# Patient Record
Sex: Female | Born: 1940
Health system: Southern US, Community
[De-identification: ages and names within clinical notes are randomized; demographics above are authoritative.]

## PROBLEM LIST (undated history)

## (undated) DIAGNOSIS — D649 Anemia, unspecified: Secondary | ICD-10-CM

## (undated) DIAGNOSIS — E876 Hypokalemia: Secondary | ICD-10-CM

## (undated) DIAGNOSIS — K219 Gastro-esophageal reflux disease without esophagitis: Secondary | ICD-10-CM

## (undated) DIAGNOSIS — D689 Coagulation defect, unspecified: Secondary | ICD-10-CM

## (undated) DIAGNOSIS — I10 Essential (primary) hypertension: Secondary | ICD-10-CM

## (undated) DIAGNOSIS — C801 Malignant (primary) neoplasm, unspecified: Secondary | ICD-10-CM

## (undated) DIAGNOSIS — K635 Polyp of colon: Secondary | ICD-10-CM

## (undated) DIAGNOSIS — M1990 Unspecified osteoarthritis, unspecified site: Secondary | ICD-10-CM

## (undated) DIAGNOSIS — Z8612 Personal history of poliomyelitis: Secondary | ICD-10-CM

## (undated) DIAGNOSIS — H269 Unspecified cataract: Secondary | ICD-10-CM

## (undated) DIAGNOSIS — I4891 Unspecified atrial fibrillation: Secondary | ICD-10-CM

## (undated) DIAGNOSIS — T783XXA Angioneurotic edema, initial encounter: Secondary | ICD-10-CM

## (undated) DIAGNOSIS — E785 Hyperlipidemia, unspecified: Secondary | ICD-10-CM

## (undated) DIAGNOSIS — R059 Cough, unspecified: Secondary | ICD-10-CM

## (undated) DIAGNOSIS — T7840XA Allergy, unspecified, initial encounter: Secondary | ICD-10-CM

## (undated) DIAGNOSIS — K52831 Collagenous colitis: Secondary | ICD-10-CM

## (undated) DIAGNOSIS — R05 Cough: Secondary | ICD-10-CM

## (undated) HISTORY — DX: Unspecified cataract: H26.9

## (undated) HISTORY — DX: Allergy, unspecified, initial encounter: T78.40XA

## (undated) HISTORY — DX: Angioneurotic edema, initial encounter: T78.3XXA

## (undated) HISTORY — DX: Malignant (primary) neoplasm, unspecified: C80.1

## (undated) HISTORY — DX: Hypokalemia: E87.6

## (undated) HISTORY — PX: OTHER SURGICAL HISTORY: SHX169

## (undated) HISTORY — DX: Collagenous colitis: K52.831

## (undated) HISTORY — PX: APPENDECTOMY: SHX54

## (undated) HISTORY — DX: Personal history of poliomyelitis: Z86.12

## (undated) HISTORY — PX: BREAST BIOPSY: SHX20

## (undated) HISTORY — DX: Coagulation defect, unspecified: D68.9

## (undated) HISTORY — DX: Gastro-esophageal reflux disease without esophagitis: K21.9

## (undated) HISTORY — PX: ADENOIDECTOMY: SUR15

## (undated) HISTORY — DX: Unspecified atrial fibrillation: I48.91

## (undated) HISTORY — DX: Polyp of colon: K63.5

## (undated) HISTORY — PX: SKIN CANCER EXCISION: SHX779

## (undated) HISTORY — PX: COLONOSCOPY: SHX174

## (undated) HISTORY — PX: TONSILLECTOMY: SHX5217

## (undated) HISTORY — PX: ENDOVENOUS ABLATION SAPHENOUS VEIN W/ LASER: SUR449

## (undated) HISTORY — DX: Essential (primary) hypertension: I10

## (undated) HISTORY — DX: Unspecified osteoarthritis, unspecified site: M19.90

## (undated) HISTORY — DX: Hyperlipidemia, unspecified: E78.5

## (undated) HISTORY — PX: TONSILLECTOMY: SUR1361

---
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
## 1998-10-03 ENCOUNTER — Other Ambulatory Visit: Admission: RE | Admit: 1998-10-03 | Discharge: 1998-10-03 | Payer: Self-pay | Admitting: Obstetrics and Gynecology

## 1999-08-28 ENCOUNTER — Ambulatory Visit (HOSPITAL_COMMUNITY): Admission: RE | Admit: 1999-08-28 | Discharge: 1999-08-28 | Payer: Self-pay | Admitting: Internal Medicine

## 1999-08-28 ENCOUNTER — Encounter (INDEPENDENT_AMBULATORY_CARE_PROVIDER_SITE_OTHER): Payer: Self-pay

## 1999-11-13 ENCOUNTER — Other Ambulatory Visit: Admission: RE | Admit: 1999-11-13 | Discharge: 1999-11-13 | Payer: Self-pay | Admitting: Obstetrics and Gynecology

## 2000-12-06 ENCOUNTER — Other Ambulatory Visit: Admission: RE | Admit: 2000-12-06 | Discharge: 2000-12-06 | Payer: Self-pay | Admitting: Obstetrics and Gynecology

## 2001-12-15 ENCOUNTER — Other Ambulatory Visit: Admission: RE | Admit: 2001-12-15 | Discharge: 2001-12-15 | Payer: Self-pay | Admitting: Obstetrics and Gynecology

## 2003-01-07 ENCOUNTER — Other Ambulatory Visit: Admission: RE | Admit: 2003-01-07 | Discharge: 2003-01-07 | Payer: Self-pay | Admitting: Obstetrics and Gynecology

## 2004-02-11 ENCOUNTER — Other Ambulatory Visit: Admission: RE | Admit: 2004-02-11 | Discharge: 2004-02-11 | Payer: Self-pay | Admitting: Obstetrics and Gynecology

## 2004-07-20 ENCOUNTER — Ambulatory Visit: Payer: Self-pay | Admitting: Occupational Therapy

## 2004-11-24 ENCOUNTER — Ambulatory Visit: Payer: Self-pay | Admitting: Internal Medicine

## 2004-12-02 ENCOUNTER — Ambulatory Visit: Payer: Self-pay | Admitting: Internal Medicine

## 2005-06-10 ENCOUNTER — Other Ambulatory Visit: Admission: RE | Admit: 2005-06-10 | Discharge: 2005-06-10 | Payer: Self-pay | Admitting: Obstetrics and Gynecology

## 2006-07-04 ENCOUNTER — Encounter: Payer: Self-pay | Admitting: Internal Medicine

## 2006-08-23 ENCOUNTER — Ambulatory Visit: Payer: Self-pay | Admitting: Internal Medicine

## 2006-10-04 ENCOUNTER — Ambulatory Visit: Payer: Self-pay | Admitting: Internal Medicine

## 2006-11-16 ENCOUNTER — Ambulatory Visit: Payer: Self-pay | Admitting: Internal Medicine

## 2006-11-16 LAB — CONVERTED CEMR LAB
Cholesterol: 233 mg/dL (ref 0–200)
HDL: 63.8 mg/dL (ref >39.0)
Total CHOL/HDL Ratio: 3.7
VLDL: 16 mg/dL (ref 0–40)

## 2007-05-18 ENCOUNTER — Ambulatory Visit: Payer: Self-pay | Admitting: Internal Medicine

## 2007-05-18 LAB — CONVERTED CEMR LAB
AST: 21 units/L (ref 0–37)
Direct LDL: 139.7 mg/dL
Total CHOL/HDL Ratio: 3.4
VLDL: 13 mg/dL (ref 0–40)

## 2007-07-03 ENCOUNTER — Encounter: Payer: Self-pay | Admitting: Internal Medicine

## 2007-07-03 DIAGNOSIS — Z862 Personal history of diseases of the blood and blood-forming organs and certain disorders involving the immune mechanism: Secondary | ICD-10-CM | POA: Insufficient documentation

## 2007-07-03 DIAGNOSIS — K52831 Collagenous colitis: Secondary | ICD-10-CM | POA: Insufficient documentation

## 2007-07-03 DIAGNOSIS — E7800 Pure hypercholesterolemia, unspecified: Secondary | ICD-10-CM | POA: Insufficient documentation

## 2007-07-03 DIAGNOSIS — Z8612 Personal history of poliomyelitis: Secondary | ICD-10-CM | POA: Insufficient documentation

## 2007-07-03 DIAGNOSIS — K589 Irritable bowel syndrome without diarrhea: Secondary | ICD-10-CM | POA: Insufficient documentation

## 2007-07-03 DIAGNOSIS — E785 Hyperlipidemia, unspecified: Secondary | ICD-10-CM

## 2007-07-03 DIAGNOSIS — C44791 Other specified malignant neoplasm of skin of unspecified lower limb, including hip: Secondary | ICD-10-CM | POA: Insufficient documentation

## 2007-07-04 ENCOUNTER — Ambulatory Visit: Payer: Self-pay | Admitting: Internal Medicine

## 2007-08-08 ENCOUNTER — Telehealth: Payer: Self-pay | Admitting: Internal Medicine

## 2007-09-06 ENCOUNTER — Ambulatory Visit: Payer: Self-pay | Admitting: Internal Medicine

## 2007-09-06 LAB — CONVERTED CEMR LAB
Direct LDL: 137.2 mg/dL
Triglycerides: 59 mg/dL (ref 0–149)
VLDL: 12 mg/dL (ref 0–40)

## 2007-09-07 ENCOUNTER — Telehealth: Payer: Self-pay | Admitting: Internal Medicine

## 2007-09-20 ENCOUNTER — Ambulatory Visit: Payer: Self-pay | Admitting: Internal Medicine

## 2007-09-20 DIAGNOSIS — I10 Essential (primary) hypertension: Secondary | ICD-10-CM

## 2007-09-21 ENCOUNTER — Encounter: Payer: Self-pay | Admitting: Internal Medicine

## 2007-10-25 ENCOUNTER — Ambulatory Visit: Payer: Self-pay | Admitting: Internal Medicine

## 2007-10-25 LAB — CONVERTED CEMR LAB
BUN: 20 mg/dL (ref 6–23)
CRP, High Sensitivity: <1 — ABNORMAL LOW (ref 0.00–5.00)
Creatinine, Ser: 0.7 mg/dL (ref 0.4–1.2)
GFR calc non Af Amer: 89 mL/min
Potassium: 4.6 meq/L (ref 3.5–5.1)

## 2007-11-01 ENCOUNTER — Ambulatory Visit: Payer: Self-pay | Admitting: Internal Medicine

## 2007-11-01 DIAGNOSIS — K219 Gastro-esophageal reflux disease without esophagitis: Secondary | ICD-10-CM

## 2007-12-08 ENCOUNTER — Ambulatory Visit: Payer: Self-pay | Admitting: Internal Medicine

## 2007-12-08 LAB — CONVERTED CEMR LAB
BUN: 22 mg/dL (ref 6–23)
CO2: 31 meq/L (ref 19–32)
Calcium: 9.5 mg/dL (ref 8.4–10.5)
Chloride: 102 meq/L (ref 96–112)
Creatinine, Ser: 0.7 mg/dL (ref 0.4–1.2)
GFR calc Af Amer: 107 mL/min
GFR calc non Af Amer: 89 mL/min
Glucose, Bld: 97 mg/dL (ref 70–99)
Potassium: 3.5 meq/L (ref 3.5–5.1)
Sodium: 140 meq/L (ref 135–145)

## 2007-12-10 ENCOUNTER — Telehealth: Payer: Self-pay | Admitting: Internal Medicine

## 2007-12-14 ENCOUNTER — Ambulatory Visit: Payer: Self-pay | Admitting: Internal Medicine

## 2008-04-23 ENCOUNTER — Ambulatory Visit: Payer: Self-pay | Admitting: Internal Medicine

## 2008-04-23 DIAGNOSIS — J309 Allergic rhinitis, unspecified: Secondary | ICD-10-CM | POA: Insufficient documentation

## 2008-05-28 ENCOUNTER — Ambulatory Visit: Payer: Self-pay | Admitting: Internal Medicine

## 2008-05-28 LAB — CONVERTED CEMR LAB
BUN: 16 mg/dL (ref 6–23)
CO2: 32 meq/L (ref 19–32)
Calcium: 9.4 mg/dL (ref 8.4–10.5)
Chloride: 102 meq/L (ref 96–112)
Cholesterol: 234 mg/dL (ref 0–200)
Creatinine, Ser: 0.7 mg/dL (ref 0.4–1.2)
Glucose, Bld: 96 mg/dL (ref 70–99)
Total CHOL/HDL Ratio: 4.2
Triglycerides: 87 mg/dL (ref 0–149)

## 2008-06-04 ENCOUNTER — Ambulatory Visit: Payer: Self-pay | Admitting: Internal Medicine

## 2008-07-02 ENCOUNTER — Telehealth: Payer: Self-pay | Admitting: Internal Medicine

## 2008-07-17 ENCOUNTER — Encounter: Payer: Self-pay | Admitting: Internal Medicine

## 2008-07-25 ENCOUNTER — Encounter: Payer: Self-pay | Admitting: Internal Medicine

## 2008-08-21 ENCOUNTER — Telehealth: Payer: Self-pay | Admitting: Internal Medicine

## 2008-08-28 ENCOUNTER — Ambulatory Visit: Payer: Self-pay | Admitting: Internal Medicine

## 2008-09-24 ENCOUNTER — Telehealth: Payer: Self-pay | Admitting: Internal Medicine

## 2008-11-12 ENCOUNTER — Ambulatory Visit: Payer: Self-pay | Admitting: Internal Medicine

## 2008-12-02 ENCOUNTER — Telehealth: Payer: Self-pay | Admitting: Internal Medicine

## 2008-12-10 ENCOUNTER — Ambulatory Visit: Payer: Self-pay | Admitting: Internal Medicine

## 2008-12-10 ENCOUNTER — Encounter: Payer: Self-pay | Admitting: Internal Medicine

## 2008-12-11 ENCOUNTER — Encounter: Payer: Self-pay | Admitting: Internal Medicine

## 2009-02-27 ENCOUNTER — Telehealth: Payer: Self-pay | Admitting: Family Medicine

## 2009-03-07 ENCOUNTER — Telehealth: Payer: Self-pay | Admitting: Internal Medicine

## 2009-03-31 ENCOUNTER — Ambulatory Visit: Payer: Self-pay | Admitting: Internal Medicine

## 2009-03-31 DIAGNOSIS — R079 Chest pain, unspecified: Secondary | ICD-10-CM | POA: Insufficient documentation

## 2009-03-31 LAB — CONVERTED CEMR LAB
ALT: 34 units/L (ref 0–35)
BUN: 21 mg/dL (ref 6–23)
Bilirubin, Direct: 0.1 mg/dL (ref 0.0–0.3)
CO2: 26 meq/L (ref 19–32)
CRP: 0.3 mg/dL (ref <0.6)
Chloride: 101 meq/L (ref 96–112)
Glucose, Bld: 105 mg/dL — ABNORMAL HIGH (ref 70–99)
Indirect Bilirubin: 0.6 mg/dL (ref 0.0–0.9)
LDL Cholesterol: 144 mg/dL — ABNORMAL HIGH (ref 0–99)
Potassium: 4 meq/L (ref 3.5–5.3)
Sodium: 140 meq/L (ref 135–145)
Total CHOL/HDL Ratio: 3.6
VLDL: 23 mg/dL (ref 0–40)

## 2009-04-01 ENCOUNTER — Encounter: Payer: Self-pay | Admitting: Internal Medicine

## 2009-04-04 ENCOUNTER — Telehealth: Payer: Self-pay | Admitting: Internal Medicine

## 2009-04-04 ENCOUNTER — Ambulatory Visit: Payer: Self-pay | Admitting: Cardiology

## 2009-04-17 ENCOUNTER — Encounter: Payer: Self-pay | Admitting: Cardiology

## 2009-04-17 ENCOUNTER — Ambulatory Visit: Payer: Self-pay

## 2009-04-22 ENCOUNTER — Telehealth: Payer: Self-pay | Admitting: Cardiology

## 2009-04-27 ENCOUNTER — Encounter: Payer: Self-pay | Admitting: Internal Medicine

## 2009-05-01 ENCOUNTER — Telehealth: Payer: Self-pay | Admitting: Internal Medicine

## 2009-05-07 ENCOUNTER — Encounter: Payer: Self-pay | Admitting: Internal Medicine

## 2009-05-29 ENCOUNTER — Telehealth: Payer: Self-pay | Admitting: Internal Medicine

## 2009-06-05 ENCOUNTER — Ambulatory Visit: Payer: Self-pay | Admitting: Internal Medicine

## 2009-07-15 ENCOUNTER — Ambulatory Visit: Payer: Self-pay | Admitting: Internal Medicine

## 2009-07-16 ENCOUNTER — Encounter: Payer: Self-pay | Admitting: Internal Medicine

## 2009-07-17 ENCOUNTER — Telehealth: Payer: Self-pay | Admitting: Internal Medicine

## 2009-07-17 DIAGNOSIS — M25559 Pain in unspecified hip: Secondary | ICD-10-CM

## 2009-08-05 ENCOUNTER — Encounter: Admission: RE | Admit: 2009-08-05 | Discharge: 2009-08-05 | Payer: Self-pay | Admitting: Orthopaedic Surgery

## 2009-08-07 ENCOUNTER — Ambulatory Visit: Payer: Self-pay | Admitting: Family

## 2009-08-07 DIAGNOSIS — N390 Urinary tract infection, site not specified: Secondary | ICD-10-CM

## 2009-08-07 LAB — CONVERTED CEMR LAB
Ketones, urine, test strip: NEGATIVE
Nitrite: POSITIVE
Protein, U semiquant: 30
pH: 5

## 2009-08-13 ENCOUNTER — Encounter: Payer: Self-pay | Admitting: Internal Medicine

## 2009-08-14 ENCOUNTER — Encounter: Payer: Self-pay | Admitting: Internal Medicine

## 2009-08-25 ENCOUNTER — Telehealth: Payer: Self-pay | Admitting: Internal Medicine

## 2009-08-28 ENCOUNTER — Telehealth: Payer: Self-pay | Admitting: Internal Medicine

## 2009-09-12 ENCOUNTER — Telehealth: Payer: Self-pay | Admitting: Internal Medicine

## 2009-09-12 ENCOUNTER — Ambulatory Visit: Payer: Self-pay | Admitting: Internal Medicine

## 2009-09-12 LAB — CONVERTED CEMR LAB
Alkaline Phosphatase: 46 units/L (ref 39–117)
Bilirubin, Direct: 0.2 mg/dL (ref 0.0–0.3)
CO2: 31 meq/L (ref 19–32)
Calcium: 9.9 mg/dL (ref 8.4–10.5)
Chloride: 101 meq/L (ref 96–112)
Creatinine, Ser: 0.7 mg/dL (ref 0.4–1.2)
Direct LDL: 123 mg/dL
HDL: 69.7 mg/dL (ref >39.00)
Sodium: 139 meq/L (ref 135–145)
Total Bilirubin: 1 mg/dL (ref 0.3–1.2)
Total CHOL/HDL Ratio: 3
Triglycerides: 73 mg/dL (ref 0.0–149.0)
VLDL: 14.6 mg/dL (ref 0.0–40.0)

## 2009-09-19 ENCOUNTER — Ambulatory Visit: Payer: Self-pay | Admitting: Internal Medicine

## 2009-10-29 ENCOUNTER — Telehealth: Payer: Self-pay | Admitting: Internal Medicine

## 2009-11-03 ENCOUNTER — Encounter (INDEPENDENT_AMBULATORY_CARE_PROVIDER_SITE_OTHER): Payer: Self-pay | Admitting: *Deleted

## 2009-11-03 ENCOUNTER — Telehealth (INDEPENDENT_AMBULATORY_CARE_PROVIDER_SITE_OTHER): Payer: Self-pay | Admitting: *Deleted

## 2009-12-16 ENCOUNTER — Ambulatory Visit: Payer: Self-pay | Admitting: Internal Medicine

## 2009-12-24 ENCOUNTER — Telehealth: Payer: Self-pay | Admitting: Internal Medicine

## 2010-01-14 ENCOUNTER — Ambulatory Visit: Payer: Self-pay | Admitting: Internal Medicine

## 2010-01-14 DIAGNOSIS — K635 Polyp of colon: Secondary | ICD-10-CM

## 2010-01-14 HISTORY — DX: Polyp of colon: K63.5

## 2010-01-15 ENCOUNTER — Encounter: Payer: Self-pay | Admitting: Internal Medicine

## 2010-01-21 ENCOUNTER — Encounter: Payer: Self-pay | Admitting: Internal Medicine

## 2010-01-27 ENCOUNTER — Telehealth: Payer: Self-pay | Admitting: Internal Medicine

## 2010-02-05 ENCOUNTER — Encounter: Payer: Self-pay | Admitting: Internal Medicine

## 2010-02-05 LAB — CONVERTED CEMR LAB
Calcium: 9.6 mg/dL (ref 8.4–10.5)
Creatinine, Ser: 0.86 mg/dL (ref 0.40–1.20)
Sodium: 141 meq/L (ref 135–145)

## 2010-02-11 ENCOUNTER — Encounter: Payer: Self-pay | Admitting: Internal Medicine

## 2010-03-02 ENCOUNTER — Telehealth: Payer: Self-pay | Admitting: Internal Medicine

## 2010-03-09 ENCOUNTER — Ambulatory Visit: Payer: Self-pay | Admitting: Internal Medicine

## 2010-03-09 ENCOUNTER — Ambulatory Visit: Payer: Self-pay | Admitting: Diagnostic Radiology

## 2010-03-09 ENCOUNTER — Ambulatory Visit (HOSPITAL_BASED_OUTPATIENT_CLINIC_OR_DEPARTMENT_OTHER): Admission: RE | Admit: 2010-03-09 | Discharge: 2010-03-09 | Payer: Self-pay | Admitting: Internal Medicine

## 2010-03-09 ENCOUNTER — Telehealth: Payer: Self-pay | Admitting: Internal Medicine

## 2010-03-09 DIAGNOSIS — R05 Cough: Secondary | ICD-10-CM

## 2010-04-08 ENCOUNTER — Ambulatory Visit: Payer: Self-pay | Admitting: Family

## 2010-04-08 DIAGNOSIS — J329 Chronic sinusitis, unspecified: Secondary | ICD-10-CM | POA: Insufficient documentation

## 2010-04-08 DIAGNOSIS — J40 Bronchitis, not specified as acute or chronic: Secondary | ICD-10-CM | POA: Insufficient documentation

## 2010-05-27 ENCOUNTER — Encounter: Payer: Self-pay | Admitting: Internal Medicine

## 2010-05-27 ENCOUNTER — Encounter: Admission: RE | Admit: 2010-05-27 | Discharge: 2010-05-27 | Payer: Self-pay | Admitting: Orthopaedic Surgery

## 2010-05-27 ENCOUNTER — Telehealth: Payer: Self-pay | Admitting: Internal Medicine

## 2010-06-01 ENCOUNTER — Ambulatory Visit: Payer: Self-pay | Admitting: Internal Medicine

## 2010-06-01 LAB — CONVERTED CEMR LAB
CO2: 27 meq/L (ref 19–32)
Chloride: 102 meq/L (ref 96–112)
Creatinine, Ser: 0.77 mg/dL (ref 0.40–1.20)
Potassium: 4.5 meq/L (ref 3.5–5.3)
Sodium: 140 meq/L (ref 135–145)

## 2010-06-02 ENCOUNTER — Encounter: Payer: Self-pay | Admitting: Internal Medicine

## 2010-07-13 ENCOUNTER — Ambulatory Visit: Payer: Self-pay | Admitting: Gastroenterology

## 2010-07-13 ENCOUNTER — Telehealth: Payer: Self-pay | Admitting: Internal Medicine

## 2010-07-13 DIAGNOSIS — R197 Diarrhea, unspecified: Secondary | ICD-10-CM

## 2010-07-14 ENCOUNTER — Telehealth: Payer: Self-pay | Admitting: Nurse Practitioner

## 2010-07-16 ENCOUNTER — Telehealth: Payer: Self-pay | Admitting: Internal Medicine

## 2010-07-21 ENCOUNTER — Telehealth: Payer: Self-pay | Admitting: Nurse Practitioner

## 2010-08-26 ENCOUNTER — Encounter: Payer: Self-pay | Admitting: Internal Medicine

## 2010-08-26 ENCOUNTER — Ambulatory Visit
Admission: RE | Admit: 2010-08-26 | Discharge: 2010-08-26 | Payer: Self-pay | Source: Home / Self Care | Attending: Vascular Surgery | Admitting: Vascular Surgery

## 2010-08-31 ENCOUNTER — Encounter: Payer: Self-pay | Admitting: Internal Medicine

## 2010-09-15 NOTE — Assessment & Plan Note (Signed)
Summary: 2 month follow up/mhf   Vital Signs:  Patient profile:   70 year old female Weight:      170.50 pounds BMI:     28.48 O2 Sat:      96 % on Room air Temp:     98.2 degrees F oral Pulse rate:   66 / minute Pulse rhythm:   regular Resp:     18 per minute BP sitting:   130 / 70  (right arm) Cuff size:   large  Vitals Entered By: Glendell Docker CMA (September 19, 2009 10:54 AM)  O2 Flow:  Room air  Primary Care Provider:  Thomos Lemons, DO  CC:  2 Month Follow up.  History of Present Illness: 2 Month Follow up  started physical therapy for 3 days a week to help with hip pain arthritis in lower spine dx by ortho- informed that may be related to her hip pain  Htn - stable.  reviewed PFT's - small airway dz  Hyperlipidemia - good med compliance  She has good trip to [place] to visit her grandchildren.  valium made her loopy.  did not sleep well on long flight  Preventive Screening-Counseling & Management  Alcohol-Tobacco     Smoking Status: quit  Allergies: 1)  ! Erythromycin 2)  ! Sulfa  Past History:  Past Medical History: Current Problems:  CHEST PAIN (ICD-786.50) HYPERTENSION (ICD-401.9)  HYPERLIPIDEMIA (ICD-272.4) ALLERGIC RHINITIS (ICD-477.9) GERD (ICD-530.81) SKIN CANCER, LEG (ICD-173.7) ANEMIA, HX OF (ICD-V12.3) POLIOMYELITIS, HX OF (ICD-V12.02) IRRITABLE BOWEL SYNDROME (ICD-564.1) COLLAGENOUS COLITIS (ICD-710.9) Family Hx of COLON CANCER (ICD-153.9) Polio - age 74 ( no significant neuromuscular deficit)  Family Hx of COLON CANCER (ICD-153.9) Skin cancer, left lower leg.  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   Lungs:  normal respiratory effort and normal breath sounds.   Heart:  normal rate, regular rhythm, no murmur, and no gallop.   Extremities:  No lower extremity edema    Impression & Recommendations:  Problem # 1:  HYPERTENSION (ICD-401.9) Assessment Improved Her PFTs suggests reversible airway dz.  Use more selective B Blocker.  Her updated medication list for this problem includes:    Chlorthalidone 25 Mg Tabs (Chlorthalidone) ..... One by mouth qd    Lisinopril 5 Mg Tabs (Lisinopril) ..... One by mouth qd    Bystolic 5 Mg Tabs (Nebivolol hcl) ..... One by mouth qd  BP today: 130/70 Prior BP: 150/90 (08/07/2009)  Labs Reviewed: K+: 4.2 (09/12/2009) Creat: : 0.7 (09/12/2009)   Chol: 203 (09/12/2009)   HDL: 69.70 (09/12/2009)   LDL: 144 (03/31/2009)   TG: 73.0 (09/12/2009)  Problem # 2:  HYPERLIPIDEMIA (ICD-272.4) Assessment: Improved LDL is less than 130.  Maintain current medication regimen.  Her updated medication list for this problem includes:    Welchol 625 Mg Tabs (Colesevelam hcl) .Marland Kitchen... 2 tab by mouth two times a day    Pravastatin Sodium 40 Mg Tabs (Pravastatin sodium) .Marland Kitchen... Take 1 tab by mouth at bedtime  Labs Reviewed: SGOT: 26 (09/12/2009)   SGPT: 30 (09/12/2009)   HDL:69.70 (09/12/2009), 64 (03/31/2009)  LDL:144 (03/31/2009), DEL (05/28/2008)  Chol:203 (09/12/2009), 231 (03/31/2009)  Trig:73.0 (09/12/2009), 116 (03/31/2009)  Complete Medication List: 1)  Multivitamins Tabs (Multiple vitamin) .... One by mouth once daily 2)  Benadryl 25 Mg Caps (Diphenhydramine hcl) .... One o at bedtime 3)  Calcium 500 500 Mg Tabs (Calcium carbonate) .... One by mouth once daily 4)  Welchol 625 Mg Tabs (Colesevelam hcl) .... 2 tab by mouth two times a day 5)  Chlorthalidone 25 Mg Tabs (Chlorthalidone) .... One by mouth qd 6)  Fish Oil 1000 Mg Caps (Omega-3 fatty acids) .... Take 1 tablet by mouth once a day 7)   Omeprazole 20 Mg Tbec (Omeprazole) .... One by mouth qd 8)  Lisinopril 5 Mg Tabs (Lisinopril) .... One by mouth qd 9)  Desonide 0.05 % Crea (Desonide) .... Apply to affected area as needed two times a day 10)  Bystolic 5 Mg Tabs (Nebivolol hcl) .... One by mouth qd 11)  Pravastatin Sodium 40 Mg Tabs (Pravastatin sodium) .... Take 1 tab by mouth at bedtime 12)  Aspirin 81 Mg Tabs (Aspirin) .... Take 1 tablet by mouth once a day 13)  Zolpidem Tartrate 5 Mg Tabs (Zolpidem tartrate) .... One tab by mouth at bedtime as needed  Other Orders: Tdap => 20yrs IM (37628) Admin 1st Vaccine (31517)  Patient Instructions: 1)  Please schedule a follow-up appointment in 4 months. Prescriptions: ZOLPIDEM TARTRATE 5 MG TABS (ZOLPIDEM TARTRATE) one tab by mouth at bedtime as needed  #30 x 0   Entered and Authorized by:   D. Thomos Lemons DO   Signed by:   D. Thomos Lemons DO on 09/19/2009   Method used:   Print then Give to Patient   RxID:   [id number] BYSTOLIC 5 MG TABS (NEBIVOLOL HCL) one by mouth qd  #90 x 1   Entered and Authorized by:   D. Thomos Lemons DO   Signed by:   D. Thomos Lemons DO on 09/19/2009   Method used:   Print then Give to Patient   RxID:   [id number]   Current Allergies (reviewed today): No known allergies    Immunizations Administered:  Tetanus Vaccine:    Vaccine Type: Tdap    Site: left deltoid    Mfr: GlaxoSmithKline    Dose: 0.5 ml    Route: IM    Given by: Glendell Docker CMA    Exp. Date: 10/11/2011    Lot #: [id number]    VIS given: 07/04/07 version given September 19, 2009.    Preventive Care Screening  Mammogram:    Date:  07/28/2009    Results:  normal  Bone Density:    Date:  01/28/2009    Results:  normal

## 2010-09-15 NOTE — Progress Notes (Signed)
Summary: Tetanus Update  Phone Note Call from Patient   Caller: Patient Summary of Call: Pt. was wondering if she can get her [place] Shot when she comes for her 09/19/09 f/u appointment? Call her back and let her know at [phone] Initial call taken by: Michaelle Copas,  August 25, 2009 12:21 PM  Follow-up for Phone Call        Phone Call Completed, patient advised that she should be okay to update tetanus Follow-up by: Glendell Docker CMA,  August 25, 2009 4:31 PM

## 2010-09-15 NOTE — Progress Notes (Signed)
Summary: Welchol Refill  Phone Note Refill Request Message from:  Fax from Pharmacy on October 29, 2009 1:46 PM  Refills Requested: Medication #1:  WELCHOL 625 MG  TABS 2 tab by mouth two times a day   Dosage confirmed as above?Dosage Confirmed   Brand Name Necessary? No   Supply Requested: 1 month   Last Refilled: 07/31/2009 [facility] [phone]   Method Requested: Electronic Next Appointment Scheduled: none Initial call taken by: Roselle Locus,  October 29, 2009 1:47 PM  Follow-up for Phone Call        Rx completed in Dr. Tiajuana Amass Follow-up by: Glendell Docker CMA,  October 29, 2009 3:03 PM    Prescriptions: WELCHOL 625 MG  TABS (COLESEVELAM HCL) 2 tab by mouth two times a day  #120 x 3   Entered by:   Glendell Docker CMA   Authorized by:   D. Thomos Lemons DO   Signed by:   Glendell Docker CMA on 10/29/2009   Method used:   Electronically to        [facility] (retail)       [address]       [place], [place]  [address]       Ph: [phone]       Fax: [phone]   RxID:   [id number]

## 2010-09-15 NOTE — Progress Notes (Signed)
Summary: Status Update and Blood Work  Phone Note Call from Patient Call back at [phone]   Caller: Patient Call For: D. Thomos Lemons DO Summary of Call: patient called and left voice message wanting Dr Artist Pais to know that she will be out of the country and will not be able to return for an office visit until after July 4th due to her travel schedule. Therefore putting her at a 5 month follow up, instead of 4.  She is scheduled for a colonscopy for June 1 with Dr Juanda Chance. She would like to know if she will need blood work prior to the return office visit. Her message states that she had her blood pressure checked at Dr Delia Chimes and it was 122/75. Initial call taken by: Glendell Docker CMA,  Dec 24, 2009 2:04 PM  Follow-up for Phone Call        bmet before appt 401.9 Follow-up by: D. Thomos Lemons DO,  Dec 24, 2009 9:26 PM  Additional Follow-up for Phone Call Additional follow up Details #1::        call placed to patient at [phone], she was advised per Dr Artist Pais instructions. She states she wil call back the first of June to let the office know which location she will be having her lab work drawn Additional Follow-up by: Glendell Docker CMA,  Dec 25, 2009 1:30 PM

## 2010-09-15 NOTE — Progress Notes (Signed)
Summary: Lisinopril Refill  Phone Note Refill Request Message from:  Fax from Pharmacy on August 28, 2009 9:41 AM  Refills Requested: Medication #1:  LISINOPRIL 5 MG TABS one by mouth qd   Dosage confirmed as above?Dosage Confirmed   Brand Name Necessary? No   Supply Requested: 1 month   Last Refilled: 07/31/2009  Method Requested: Electronic Next Appointment Scheduled: 09/19/2009 @ 10:45 Initial call taken by: Glendell Docker CMA,  August 28, 2009 9:42 AM    Prescriptions: LISINOPRIL 5 MG TABS (LISINOPRIL) one by mouth qd  #30 x 6   Entered by:   Glendell Docker CMA   Authorized by:   D. Thomos Lemons DO   Signed by:   Glendell Docker CMA on 08/28/2009   Method used:   Electronically to        [facility] (retail)       [address]       [place], [place]  [address]       Ph: [phone]       Fax: [phone]   RxID:   [id number]

## 2010-09-15 NOTE — Letter (Signed)
Summary: [facility]  [facility]   Imported By: Maryln Gottron 06/12/2010 09:37:26  _____________________________________________________________________  External Attachment:    Type:   Image     Comment:   External Document

## 2010-09-15 NOTE — Progress Notes (Signed)
Summary: Triage  Phone Note Call from Patient Call back at Home Phone [phone]   Caller: Patient Call For: Dr. Juanda Chance Reason for Call: Talk to Nurse Summary of Call: Pt wants to discuss a possible RX for cipro, wants to be seen for severe diarrhea, got this while in [place] so she is not sure if this was due to a virus but she is back now having constant diarrhea Initial call taken by: Raechel Chute,  July 13, 2010 11:30 AM  Follow-up for Phone Call        Patient calling to report "constant diarrhea" up to 8 times yesterday. Has to get up at night. Patient was in [place] from 06/19/10-07/03/10. She got sick after eating at a resturant on 06/19/10. She took Cipro for 4 days(11/5-11/8/11) and got better. Since she got home on the 18th, she has gotten worse again. Constant diarrhea without cramping. Taking Imodium without relief. Patient scheduled to see Willette Cluster, RNP today at 3:30 PM. Follow-up by: Jesse Fall RN,  July 13, 2010 11:48 AM     Appended Document: Triage please obtain stoo c&S, C.Diff, O&P, then start Falgyl 250 three times a day, #21, Cipro 250 mg by mouth three times a day, #21`, no refills. call back in 1 week. This could be collagenous colitis, in which case dshe will need an Entecort 9 mg/day.  Appended Document: Triage Above instructions sent to Willette Cluster, RNP

## 2010-09-15 NOTE — Procedures (Signed)
Summary: Colonoscopy  Patient: [name] [name] Note: All result statuses are Final unless otherwise noted.  Tests: (1) Colonoscopy (COL)   COL Colonoscopy           DONE     [facility]     [address]     [place], [place]  [address]           COLONOSCOPY PROCEDURE REPORT           PATIENT:  [name], [name]  MR#:  [id number]     BIRTHDATE:  Nov 21, 1940, 69 yrs. old  GENDER:  female     ENDOSCOPIST:  Hedwig Morton. Juanda Chance, MD     REF. BY:     PROCEDURE DATE:  01/14/2010     PROCEDURE:  Colonoscopy 60630     ASA CLASS:  Class I     INDICATIONS:  hx of collagenous colitis 1601,0932 and 1988     last colon 2006 normal     + f.hx of colon cancer MGM     MEDICATIONS:   Versed 8 mg, Fentanyl 75 mcg           DESCRIPTION OF PROCEDURE:   After the risks benefits and     alternatives of the procedure were thoroughly explained, informed     consent was obtained.  Digital rectal exam was performed and     revealed no rectal masses.   The Pentax Ped Colon [id number]     endoscope was introduced through the anus and advanced to the     cecum, which was identified by both the appendix and ileocecal     valve, without limitations.  The quality of the prep was good,     using MiraLax.  The instrument was then slowly withdrawn as the     colon was fully examined.     <<PROCEDUREIMAGES>>           FINDINGS:  A diminutive polyp was found. 2 polyps 2 mm at 15 and     20 cm removed The polyps were removed using cold biopsy forceps     (see image5 and image6).  This was otherwise a normal examination     of the colon. Random biopsies were obtained and sent to pathology     (see image1, image2, image3, and image4). r/o collagenous colitis     Retroflexed views in the rectum revealed no abnormalities.    The     scope was then withdrawn from the patient and the procedure     completed.           COMPLICATIONS:  None     ENDOSCOPIC IMPRESSION:     1) Diminutive polyp     2) Otherwise normal examination    RECOMMENDATIONS:     1) Await pathology results     Welchol 625 mg prn diarrhea     REPEAT EXAM:  In 7 year(s) for.           ______________________________     Hedwig Morton. Juanda Chance, MD           CC:           n.     eSIGNED:   Hedwig Morton. Brodie at 01/14/2010 11:27 AM           Knoke, Steve Rattler, [phone]  Note: An exclamation mark (!) indicates a result that was not dispersed into the flowsheet. Document Creation Date: 01/14/2010 11:28 AM _______________________________________________________________________  (1) Order result status: Final  Collection or observation date-time: 01/14/2010 11:20 Requested date-time:  Receipt date-time:  Reported date-time:  Referring Physician:   Ordering Physician: Lina Sar [phone]) Specimen Source:  Source: Launa Grill Order Number: [id number] Lab site:   Appended Document: Colonoscopy     Procedures Next Due Date:    Colonoscopy: 01/2020

## 2010-09-15 NOTE — Progress Notes (Signed)
Summary: Chlorthalidone refill  Phone Note Refill Request Message from:  Fax from Pharmacy on May 27, 2010 10:59 AM  Refills Requested: Medication #1:  CHLORTHALIDONE 25 MG  TABS one by mouth qd   Dosage confirmed as above?Dosage Confirmed   Brand Name Necessary? No   Supply Requested: 1 month   Last Refilled: 04/27/2010 [name] [phone]   Method Requested: Electronic Next Appointment Scheduled: 10.17.11 Initial call taken by: Lannette Donath,  May 27, 2010 11:02 AM  Follow-up for Phone Call        ok to refill x 5 Follow-up by: D. Thomos Lemons DO,  May 27, 2010 8:41 PM  Additional Follow-up for Phone Call Additional follow up Details #1::        Rx called to pharmacy to [facility] [phone] Additional Follow-up by: Glendell Docker CMA,  May 28, 2010 8:44 AM    Prescriptions: CHLORTHALIDONE 25 MG  TABS (CHLORTHALIDONE) one by mouth qd  #30 x 5   Entered by:   Glendell Docker CMA   Authorized by:   D. Thomos Lemons DO   Signed by:   Glendell Docker CMA on 05/28/2010   Method used:   Telephoned to ...       [name] (retail)       [address]       [place], [place]  [address]       Ph: [phone]       Fax: [phone]   RxID:   [id number]

## 2010-09-15 NOTE — Progress Notes (Signed)
Summary: Lab Results  Phone Note Outgoing Call   Summary of Call: call pt - LDL (bad cholesterol) still too high 123 - goal < 70.  I suggest we switch to Crestor.  see rx.  add FLP, AST, ALT to next lab draw Initial call taken by: D. Thomos Lemons DO,  September 12, 2009 2:13 PM  Follow-up for Phone Call        patients expressed concern with the change in statin,she prefers to stay on the Pravastatin until she sees Dr Artist Pais on Friday.  After speaking with Dr Artist Pais patient was advised to take Pravstatin 40mg  until her return appointment. Patient verbalized understanding and agrees Follow-up by: Glendell Docker CMA,  September 12, 2009 5:49 PM    New/Updated Medications: CRESTOR 20 MG TABS (ROSUVASTATIN CALCIUM) one by mouth once daily PRAVASTATIN SODIUM 40 MG TABS (PRAVASTATIN SODIUM) Take 1 tab by mouth at bedtime Prescriptions: CRESTOR 20 MG TABS (ROSUVASTATIN CALCIUM) one by mouth once daily  #30 x 2   Entered and Authorized by:   D. Thomos Lemons DO   Signed by:   D. Thomos Lemons DO on 09/12/2009   Method used:   Electronically to        [facility] (retail)       [address]       [place], [place]  [address]       Ph: [phone]       Fax: [phone]   RxID:   [id number]

## 2010-09-15 NOTE — Assessment & Plan Note (Signed)
Summary: diarrhea/ [place]   History of Present Illness Visit Type: Follow-up Visit Primary GI MD: Lina Sar MD Primary Provider: Dondra Spry DO Chief Complaint: diarrhea, hx of History of Present Illness:   Patient is a 70 year old female followed by Dr. Juanda Chance for history of collagenous colitis. his is a 70 year old white female with a remote history of collagenous colitis. She has chronic low-grade diarrhea managed with Welchol and avoidance of certain foods.  Patient recently back from [place] where she went to visit her son. She comes today for evaluation of acute on chronic loose stool. Went to [place] Oct. 30th, developed loose stool two days later, took Cipro for 4 days and felt better. Then, five days later she relapsed and took two additional days fo Cipro. When visiting [place] patient always uses bottled water.Since being back in the states patient is still having 4-5 loose BMs a day which is twice her norm.  Her appetite is poor. No abdominal pain.      GI Review of Systems    Reports loss of appetite.      Denies abdominal pain, acid reflux, belching, bloating, chest pain, dysphagia with liquids, dysphagia with solids, heartburn, nausea, vomiting, vomiting blood, weight loss, and  weight gain.      Reports diarrhea.     Denies anal fissure, black tarry stools, change in bowel habit, constipation, diverticulosis, fecal incontinence, heme positive stool, hemorrhoids, irritable bowel syndrome, jaundice, light color stool, liver problems, rectal bleeding, and  rectal pain.    Current Medications (verified): 1)  Multivitamins   Tabs (Multiple Vitamin) .... One By Mouth Once Daily 2)  Benadryl 25 Mg  Caps (Diphenhydramine Hcl) .... One O At Bedtime 3)  Calcium 500 500 Mg  Tabs (Calcium Carbonate) .... One By Mouth Once Daily 4)  Welchol 625 Mg  Tabs (Colesevelam Hcl) .... 2 Tab By Mouth Two Times A Day 5)  Chlorthalidone 25 Mg  Tabs (Chlorthalidone) .... One By Mouth Qd 6)   Fish Oil 1000 Mg Caps (Omega-3 Fatty Acids) .... Take 1 Tablet By Mouth Once A Day 7)  Omeprazole 20 Mg Tbec (Omeprazole) .... One By Mouth Qd 8)  Losartan Potassium 25 Mg Tabs (Losartan Potassium) .... One By Mouth Once Daily 9)  Desonide 0.05 % Crea (Desonide) .... Apply To Affected Area As Needed Two Times A Day 10)  Bystolic 5 Mg Tabs (Nebivolol Hcl) .... One By Mouth Qd 11)  Pravastatin Sodium 40 Mg Tabs (Pravastatin Sodium) .... Take 1 Tab By Mouth At Bedtime 12)  Aspirin 81 Mg Tabs (Aspirin) .... Take 1 Tablet By Mouth Once A Day 13)  Hydrocodone-Acetaminophen 5-325 Mg Tabs (Hydrocodone-Acetaminophen) .... Use Every 4-6 Hours As Needed Pain  Allergies (verified): 1)  ! Erythromycin 2)  ! Sulfa  Past History:  Past Medical History: Reviewed history from 06/01/2010 and no changes required. Current Problems:  CHEST PAIN (ICD-786.50)  HYPERTENSION (ICD-401.9)  HYPERLIPIDEMIA (ICD-272.4) ALLERGIC RHINITIS (ICD-477.9)   GERD (ICD-530.81) SKIN CANCER, LEG (ICD-173.7) ANEMIA, HX OF (ICD-V12.3) POLIOMYELITIS, HX OF (ICD-V12.02) IRRITABLE BOWEL SYNDROME (ICD-564.1) COLLAGENOUS COLITIS (ICD-710.9) Family Hx of COLON CANCER (ICD-153.9) Polio - age 67 ( no significant neuromuscular deficit)  Family Hx of COLON CANCER (ICD-153.9) Skin cancer, left lower leg.  Past Surgical History: Reviewed history from 06/01/2010 and no changes required. Appendectomy 1944 Breast Biopsy 1947, 05/2006- Benign Tonsillectomy 1944   Removal of skin cancer on left leg   colonoscopy    Family History: Reviewed history from 06/01/2010  and no changes required. As above father had issues also with alcoholism Paternal grandmother had COLON cancer.  Sisters with hypertension.  Family History of Stomach Cancer: Maternal Grandfather Family History of Heart Disease: Father, Grandfather  Father with CVA, hypertension Mother with PVD       Social History: Reviewed history from 06/01/2010 and no changes  required. 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  The patient denies allergy/sinus, anemia, anxiety-new, arthritis/joint pain, blood in urine, breast changes/lumps, change in vision, confusion, cough, coughing up blood, depression-new, fainting, fatigue, fever, headaches-new, hearing problems, heart murmur, heart rhythm changes, itching, menstrual pain, muscle pains/cramps, night sweats, nosebleeds, pregnancy symptoms, shortness of breath, skin rash, sleeping problems, sore throat, swelling of feet/legs, swollen lymph glands, thirst - excessive , urination - excessive , urine leakage, vision changes, and voice change.    Vital Signs:  Patient profile:   70 year old female Height:      65 inches Weight:      166.13 pounds BMI:     27.75 Pulse rate:   80 / minute Pulse rhythm:   regular BP sitting:   160 / 82  (left arm) Cuff size:   regular  Vitals Entered By: June McMurray CMA Duncan Dull) (July 13, 2010 3:32 PM)  Physical Exam  General:  Well developed, well nourished, no acute distress. Head:  Normocephalic and atraumatic. Eyes:  Conjunctiva pink, no icterus.  Mouth:  No oral lesions. Tongue moist.  Neck:  no obvious masses  Lungs:  Clear throughout to auscultation. Heart:  Regular rate and rhythm; no murmurs, rubs,  or bruits. Abdomen:  Abdomen soft, nontender, nondistended. No obvious masses or hepatomegaly.Normal bowel sounds.  Extremities:  No palmar erythema, no edema.  Neurologic:  Alert and  oriented x4;  grossly normal neurologically. Skin:  Intact without significant lesions or rashes. Psych:  Alert and cooperative. Normal mood and affect.   Impression & Recommendations:  Problem # 1:  DIARRHEA (ICD-787.91) Assessment  Deteriorated Acute on loose stool which started during recent trip to [place]. Initially responded to 4 days of Cipro but then relapsed. Suspect inadequately treated infectious process. Will obtain stool studies and treat empirically with Cipro and Flagyl. She may take Immodium as needed but we discussed maximum amount of 16mg  in 24 hour period. If no improvement she may need Entocort for possible recurrent collagenous colitis. Follow up with Dr. Juanda Chance, in meantime patient will call us in a few days with condition update.  Orders: T-C diff by PCR ([id number]) T-Culture, Stool ([id number]) T-Stool for O&P ([id number])  Patient Instructions: 1)  Please go to lab, basement level. 2)  We sent prescripions for Cipro and Flagyl to [facility]. 3)  Call us in a week with a progress report. 4)  Copy sent to : Thomos Lemons, DO 5)  The medication list was reviewed and reconciled.  All changed / newly prescribed medications were explained.  A complete medication list was provided to the patient / caregiver. Prescriptions: CIPRO 250 MG TABS (CIPROFLOXACIN HCL) Take 1 tab 3 times daily x 7 days  #21 x 0   Entered by:   Lowry Ram NCMA   Authorized by:  Willette Cluster NP   Signed by:   Lowry Ram NCMA on 07/13/2010   Method used:   Electronically to        [facility] (retail)       [address]       [place], [place]  [address]       Ph: [phone]       Fax: [phone]   RxID:   [id number] FLAGYL 250 MG TABS (METRONIDAZOLE) Take 1 tab 3 times daily x 7 days  #21 x 0   Entered by:   Lowry Ram NCMA   Authorized by:   Willette Cluster NP   Signed by:   Lowry Ram NCMA on 07/13/2010   Method used:   Electronically to        [facility] (retail)       [address]       [place], [place]  [address]       Ph: [phone]       Fax: [phone]   RxID:   [id number]

## 2010-09-15 NOTE — Consult Note (Signed)
Summary: [facility]  [facility]   Imported By: Lanelle Bal 08/27/2009 11:35:44  _____________________________________________________________________  External Attachment:    Type:   Image     Comment:   External Document

## 2010-09-15 NOTE — Assessment & Plan Note (Signed)
Summary: f/u discuss colon--ch.   History of Present Illness Visit Type: Follow-up Visit Primary GI MD: Lina Sar MD Primary Provider: Thomos Lemons, DO Chief Complaint: Colon screening,family hx of colon ca & hx of collagenous colitis History of Present Illness:   This is a 70 year old white female with a history of collagenous colitis initially diagnosed in 2001 and also in 1988 and 1994. She has chronic low-grade diarrhea which usually resolves with Welchol. She did have a flare up of diarrhea last week. She was in the past treated with Cipro unsuccessfully and then entocort 9 mg daily with good results. There is a family history of colon cancer in her maternal grandmother and gastric cancer in her paternal grandfather. Her last full colonoscopy in April 2006 did not show any evidence of collagenous colitis. Patient is interested in having her colonoscopy repeated at this time.   GI Review of Systems    Reports acid reflux.      Denies abdominal pain, belching, bloating, chest pain, dysphagia with liquids, dysphagia with solids, heartburn, loss of appetite, nausea, vomiting, vomiting blood, weight loss, and  weight gain.      Reports change in bowel habits, diarrhea, and  hemorrhoids.     Denies anal fissure, black tarry stools, constipation, diverticulosis, fecal incontinence, heme positive stool, irritable bowel syndrome, jaundice, light color stool, liver problems, rectal bleeding, and  rectal pain.    Current Medications (verified): 1)  Multivitamins   Tabs (Multiple Vitamin) .... One By Mouth Once Daily 2)  Benadryl 25 Mg  Caps (Diphenhydramine Hcl) .... One O At Bedtime 3)  Calcium 500 500 Mg  Tabs (Calcium Carbonate) .... One By Mouth Once Daily 4)  Welchol 625 Mg  Tabs (Colesevelam Hcl) .... 2 Tab By Mouth Two Times A Day 5)  Chlorthalidone 25 Mg  Tabs (Chlorthalidone) .... One By Mouth Qd 6)  Fish Oil 1000 Mg Caps (Omega-3 Fatty Acids) .... Take 1 Tablet By Mouth Once A Day 7)   Omeprazole 20 Mg Tbec (Omeprazole) .... One By Mouth Qd 8)  Lisinopril 5 Mg Tabs (Lisinopril) .... One By Mouth Qd 9)  Desonide 0.05 % Crea (Desonide) .... Apply To Affected Area As Needed Two Times A Day 10)  Bystolic 5 Mg Tabs (Nebivolol Hcl) .... One By Mouth Qd 11)  Pravastatin Sodium 40 Mg Tabs (Pravastatin Sodium) .... Take 1 Tab By Mouth At Bedtime 12)  Aspirin 81 Mg Tabs (Aspirin) .... Take 1 Tablet By Mouth Once A Day 13)  Zolpidem Tartrate 5 Mg Tabs (Zolpidem Tartrate) .... One Tab By Mouth At Bedtime As Needed  Allergies (verified): 1)  ! Erythromycin 2)  ! Sulfa  Past History:  Past Medical History: Reviewed history from 09/19/2009 and no changes required. Current Problems:  CHEST PAIN (ICD-786.50) HYPERTENSION (ICD-401.9)  HYPERLIPIDEMIA (ICD-272.4) ALLERGIC RHINITIS (ICD-477.9) GERD (ICD-530.81) SKIN CANCER, LEG (ICD-173.7) ANEMIA, HX OF (ICD-V12.3) POLIOMYELITIS, HX OF (ICD-V12.02) IRRITABLE BOWEL SYNDROME (ICD-564.1) COLLAGENOUS COLITIS (ICD-710.9) Family Hx of COLON CANCER (ICD-153.9) Polio - age 37 ( no significant neuromuscular deficit)  Family Hx of COLON CANCER (ICD-153.9) Skin cancer, left lower leg.  Past Surgical History: Reviewed history from 09/19/2009 and no changes required. Appendectomy 1944 Breast Biopsy 1947, 05/2006- Benign Tonsillectomy 1944   Removal of skin cancer on left leg  colonoscopy  Family History: Reviewed history from 06/05/2009 and no changes required. As above father had issues also with alcoholism Paternal grandmother had COLON cancer.  Sisters with hypertension.  Family History of Stomach Cancer:  Maternal Grandfather Family History of Heart Disease: Father, Grandfather  Father with CVA, hypertension Mother with PVD   Social History: Reviewed history from 09/19/2009 and no changes required. 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  The patient denies anemia, anxiety-new, back pain, blood in urine, breast changes/lumps, change in vision, confusion, cough, coughing up blood, depression-new, fainting, fatigue, fever, headaches-new, hearing problems, heart murmur, heart rhythm changes, itching, menstrual pain, nosebleeds, pregnancy symptoms, shortness of breath, skin rash, sleeping problems, sore throat, swelling of feet/legs, swollen lymph glands, thirst - excessive , urination - excessive , urination changes/pain, urine leakage, vision changes, and voice change.         Pertinent positive and negative review of systems were noted in the above HPI. All other ROS was otherwise negative.   Vital Signs:  Patient profile:   71 year old female Height:      65 inches Weight:      172 pounds BMI:     28.73 Pulse rate:   64 / minute Pulse rhythm:   regular BP sitting:   122 / 74  (left arm) Cuff size:   regular  Vitals Entered By: June McMurray CMA Duncan Dull) (Dec 16, 2009 2:52 PM)  Physical Exam  General:  Well developed, well nourished, no acute distress. Eyes:  PERRLA, no icterus. Mouth:  No deformity or lesions, dentition normal. Neck:  Supple; no masses or thyromegaly. Lungs:  Clear throughout to auscultation. Heart:  Regular rate and rhythm; no murmurs, rubs,  or bruits. Abdomen:  Soft, nontender and nondistended. No masses, hepatosplenomegaly or hernias noted. Normal bowel sounds. Skin:  Intact without significant lesions or rashes. Psych:  Alert and cooperative. Normal mood and affect.   Impression & Recommendations:  Problem # 1:  COLLAGENOUS COLITIS (ICD-710.9)  Patient has chronic low-grade diarrhea with occasional episodes of severe diarrhea. It is not clear as to whether some of her symptoms are due to IBS or are due to  collagenous colitis. It would be reasonable to repeat the colonoscopy and biopsies at this time. She takes Imodium as well as WelChol to control her diarrhea.  Orders: Colonoscopy (Colon)  Problem # 2:  CHEST PAIN (ICD-786.50) Patient has gastroesophageal reflux and is status post upper endoscopy in April 2010 with findings of mild esophagitis. She has been on omeprazole 20 mg daily.  Patient Instructions: 1)  continue WelChol 625 mg 2 a day. 2)  Colonoscopy with biopsies. 3)  Low to medium residue diet. 4)  Copy sent to : Dr R.Artist Pais 5)  The medication list was reviewed and reconciled.  All changed / newly prescribed medications were explained.  A complete medication list was provided to the patient / caregiver. Prescriptions: DULCOLAX 5 MG  TBEC (BISACODYL) Day before procedure take 2 at 3pm and 2 at 8pm.  #4 x 0   Entered by:   Lamona Curl CMA (AAMA)   Authorized by:   Hart Carwin MD   Signed by:   Lamona Curl CMA (AAMA) on 12/16/2009   Method used:   Print then Give to Patient   RxID:   [id number] REGLAN 10 MG  TABS (METOCLOPRAMIDE HCL) As per prep  instructions.  #2 x 0   Entered by:   Lamona Curl CMA (AAMA)   Authorized by:   Hart Carwin MD   Signed by:   Lamona Curl CMA (AAMA) on 12/16/2009   Method used:   Print then Give to Patient   RxID:   [id number] MIRALAX   POWD (POLYETHYLENE GLYCOL 3350) As per prep  instructions.  #255gm x 0   Entered by:   Lamona Curl CMA (AAMA)   Authorized by:   Hart Carwin MD   Signed by:   Lamona Curl CMA (AAMA) on 12/16/2009   Method used:   Print then Give to Patient   RxID:   [id number]

## 2010-09-15 NOTE — Assessment & Plan Note (Signed)
Summary: 2 month follow up/mhf   Vital Signs:  Patient profile:   70 year old female Height:      65 inches Weight:      174.50 pounds BMI:     29.14 O2 Sat:      99 % on Room air Temp:     97.9 degrees F oral Pulse rate:   68 / minute Pulse rhythm:   regular Resp:     18 per minute BP sitting:   108 / 78  (left arm) Cuff size:   regular  Vitals Entered By: Glendell Docker CMA (June 01, 2010 1:51 PM)  O2 Flow:  Room air CC: 2 Month follow up Is Patient Diabetic? No Pain Assessment Patient in pain? no      Comments will be leaving for [place] on 10/29 she would like a rx antibitoic, (son is recouperating for amebic dysentary)   Primary Care Provider:  Dondra Spry DO  CC:  2 Month follow up.  History of Present Illness: 70 y/o white female for f/u htn - stable.  no dizziness   int hx:  fell out of bed knocked out - concussion injured her left middle finger - pain x 8 weeks  2 weeks ago seen at ER for low back pain MRI of LS completed  planning to visit her grandchildren in [place]  Preventive Screening-Counseling & Management  Alcohol-Tobacco     Smoking Status: quit  Allergies: 1)  ! Erythromycin 2)  ! Sulfa  Past History:  Past Medical History: Current Problems:  CHEST PAIN (ICD-786.50)  HYPERTENSION (ICD-401.9)  HYPERLIPIDEMIA (ICD-272.4) ALLERGIC RHINITIS (ICD-477.9)   GERD (ICD-530.81) SKIN CANCER, LEG (ICD-173.7) ANEMIA, HX OF (ICD-V12.3) POLIOMYELITIS, HX OF (ICD-V12.02) IRRITABLE BOWEL SYNDROME (ICD-564.1) COLLAGENOUS COLITIS (ICD-710.9) Family Hx of COLON CANCER (ICD-153.9) Polio - age 63 ( no significant neuromuscular deficit)  Family Hx of COLON CANCER (ICD-153.9) Skin cancer, left lower leg.  Past Surgical History: Appendectomy 1944 Breast Biopsy 1947, 05/2006- Benign Tonsillectomy 1944   Removal of skin cancer on left leg   colonoscopy    Family History: As above father had issues also with alcoholism Paternal  grandmother had COLON cancer.  Sisters with hypertension.  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   Lungs:  Normal respiratory effort, chest expands symmetrically. Lungs are clear to auscultation, no crackles or wheezes. Heart:  Normal rate and regular rhythm. S1 and S2 normal without gallop, murmur, click, rub or other extra sounds. Extremities:  No lower extremity edema    Impression & Recommendations:  Problem # 1:  HYPERTENSION (ICD-401.9) Assessment Unchanged  Her updated medication list for this problem includes:    Chlorthalidone 25 Mg Tabs (Chlorthalidone) ..... One by mouth qd    Losartan Potassium 25 Mg Tabs (Losartan potassium) ..... One by mouth once daily    Bystolic 5 Mg Tabs (Nebivolol hcl) ..... One by mouth qd  Orders: T-Basic Metabolic Panel ([id number])  BP today: 108/78 Prior BP: 118/80 (04/08/2010)  Labs Reviewed: K+: 3.7 (02/05/2010) Creat: : 0.86 (02/05/2010)   Chol: 203 (  09/12/2009)   HDL: 69.70 (09/12/2009)   LDL: 144 (03/31/2009)   TG: 73.0 (09/12/2009)  Complete Medication List: 1)  Multivitamins Tabs (Multiple vitamin) .... One by mouth once daily 2)  Benadryl 25 Mg Caps (Diphenhydramine hcl) .... One o at bedtime 3)  Calcium 500 500 Mg Tabs (Calcium carbonate) .... One by mouth once daily 4)  Welchol 625 Mg Tabs (Colesevelam hcl) .... 2 tab by mouth two times a day 5)  Chlorthalidone 25 Mg Tabs (Chlorthalidone) .... One by mouth qd 6)  Fish Oil 1000 Mg Caps (Omega-3 fatty acids) .... Take 1 tablet by mouth once a day 7)  Omeprazole 20 Mg Tbec  (Omeprazole) .... One by mouth qd 8)  Losartan Potassium 25 Mg Tabs (Losartan potassium) .... One by mouth once daily 9)  Desonide 0.05 % Crea (Desonide) .... Apply to affected area as needed two times a day 10)  Bystolic 5 Mg Tabs (Nebivolol hcl) .... One by mouth qd 11)  Pravastatin Sodium 40 Mg Tabs (Pravastatin sodium) .... Take 1 tab by mouth at bedtime 12)  Aspirin 81 Mg Tabs (Aspirin) .... Take 1 tablet by mouth once a day 13)  Hydrocodone-acetaminophen 5-325 Mg Tabs (Hydrocodone-acetaminophen) .... Use every 4-6 hours as needed pain 14)  Ciprofloxacin Hcl 500 Mg Tabs (Ciprofloxacin hcl) .... Use two times a day as directed  Patient Instructions: 1)  Please schedule a follow-up appointment in 6 months. 2)  BMP prior to visit, ICD-9: 401.9 3)  Hepatic Panel prior to visit, ICD-9: 272.4 4)  Lipid Panel prior to visit, ICD-9: 272.4 5)  TSH:  272.4 6)  Please return for lab work one (1) week before your next appointment.  Prescriptions: CIPROFLOXACIN HCL 500 MG TABS (CIPROFLOXACIN HCL) use two times a day as directed  #14 x 0   Entered and Authorized by:   D. Thomos Lemons DO   Signed by:   D. Thomos Lemons DO on 06/01/2010   Method used:   Print then Give to Patient   RxID:   [id number]    Orders Added: 1)  T-Basic Metabolic Panel [[id number]] 2)  Est. Patient Level III [[address]]    Current Allergies (reviewed today): ! ERYTHROMYCIN ! SULFA

## 2010-09-15 NOTE — Letter (Signed)
Summary: New Patient letter  [facility]  [address] [place], [place] [address]   Phone: [phone]  Fax: [phone]       11/03/2009 MRN: [id number]  Linsey Hindle [address] [place], [place]  [address]  Dear Ms. Boal,  Welcome to the Gastroenterology Division at [facility].    You are scheduled to see Dr.  Juanda Chance on 12-16-09 at 2:45p.m. on the 3rd floor at [facility], [address]  We ask that you try to arrive at our office 15 minutes prior to your appointment time to allow for check-in.  We would like you to complete the enclosed self-administered evaluation form prior to your visit and bring it with you on the day of your appointment.  We will review it with you.  Also, please bring a complete list of all your medications or, if you prefer, bring the medication bottles and we will list them.  Please bring your insurance card so that we may make a copy of it.  If your insurance requires a referral to see a specialist, please bring your referral form from your primary care physician.  Co-payments are due at the time of your visit and may be paid by cash, check or credit card.     Your office visit will consist of a consult with your physician (includes a physical exam), any laboratory testing he/she may order, scheduling of any necessary diagnostic testing (e.g. x-ray, ultrasound, CT-scan), and scheduling of a procedure (e.g. Endoscopy, Colonoscopy) if required.  Please allow enough time on your schedule to allow for any/all of these possibilities.    If you cannot keep your appointment, please call [phone] to cancel or reschedule prior to your appointment date.  This allows [place] the opportunity to schedule an appointment for another patient in need of care.  If you do not cancel or reschedule by 5 p.m. the business day prior to your appointment date, you will be charged a $50.00 late cancellation/no-show fee.    Thank you for choosing [facility] for your medical needs.  We appreciate the opportunity to care for you.  Please visit [place] at our website  to learn more about our practice.                     Sincerely,                                                             The Gastroenterology Division

## 2010-09-15 NOTE — Assessment & Plan Note (Signed)
Summary: CHEST CONGESTION/HEA--Rm 4   Vital Signs:  Patient profile:   70 year old female Height:      65 inches Weight:      172.50 pounds BMI:     28.81 O2 Sat:      99 % on Room air Temp:     98.7 degrees F oral Pulse rate:   68 / minute Pulse rhythm:   regular Resp:     16 per minute BP sitting:   118 / 80  (left arm) Cuff size:   regular  Vitals Entered By: Mervin Kung CMA Duncan Dull) (April 08, 2010 1:20 PM)  O2 Flow:  Room air CC: Room 4   Head & chest congestion, sore throat since last Thursday. Has tried Mucinex otc without much relief. Is Patient Diabetic? No   Primary Care Provider:  Dondra Spry DO  CC:  Room 4   Head & chest congestion and sore throat since last Thursday. Has tried Mucinex otc without much relief..  History of Present Illness: [name] [name] is a 70 year old female who presents today with complaint of initial nasal drainage which started last Friday.  She then developed sore throat.  Monday night she tried mucinex which helped her sore throat.  Now has developed chest "burning", and cough productive of yellow sputum.  Nasal drainage is also yellow.  Has had subjective temperature.  Teeth hurt.  +sick contacts.  Allergies: 1)  ! Erythromycin 2)  ! Sulfa  Past History:  Past Medical History: Last updated: 03/09/2010 Current Problems:  CHEST PAIN (ICD-786.50)  HYPERTENSION (ICD-401.9)  HYPERLIPIDEMIA (ICD-272.4) ALLERGIC RHINITIS (ICD-477.9) GERD (ICD-530.81) SKIN CANCER, LEG (ICD-173.7) ANEMIA, HX OF (ICD-V12.3) POLIOMYELITIS, HX OF (ICD-V12.02) IRRITABLE BOWEL SYNDROME (ICD-564.1) COLLAGENOUS COLITIS (ICD-710.9) Family Hx of COLON CANCER (ICD-153.9) Polio - age 4 ( no significant neuromuscular deficit)  Family Hx of COLON CANCER (ICD-153.9) Skin cancer, left lower leg.  Past Surgical History: Last updated: 03/09/2010 Appendectomy 1944 Breast Biopsy 1947, 05/2006- Benign Tonsillectomy 1944   Removal of skin cancer on left leg     colonoscopy  Review of Systems       see hpi  Physical Exam  General:  Well-developed,well-nourished,in no acute distress; alert,appropriate and cooperative throughout examination Head:  Normocephalic and atraumatic without obvious abnormalities. No apparent alopecia or balding. + maxillary sinus tenderness to palpation. Ears:  Bilateral serous effusions without bulging or erythema Mouth:  Tonsils surgically absent, mild pharyngeal erythema. No exudates. Neck:  No deformities, masses, or tenderness noted. Lungs:  Normal respiratory effort, chest expands symmetrically. Lungs are clear to auscultation, no crackles or wheezes. Heart:  Normal rate and regular rhythm. S1 and S2 normal without gallop, murmur, click, rub or other extra sounds. Abdomen:  Bowel sounds positive,abdomen soft and non-tender without masses, organomegaly or hernias noted.   Impression & Recommendations:  Problem # 1:  SINUSITIS (ICD-473.9) Assessment New  Symptoms likely started as virus, however clinically appear to be bacterial at this point. Will plan to treat with ceftin.   Her updated medication list for this problem includes:    Ceftin 500 Mg Tabs (Cefuroxime axetil) ..... One tablet by mouth two times a day x 10 days  Orders: Prescription Created Electronically [phone])  Problem # 2:  BRONCHITIS (ICD-490) Pt reports that cough has been well controlled with MucinexDM, plan to continue same as well as cefting as for #1.  Pt instructed to call for follow up if symptoms worsen or do not improve. Her updated medication  list for this problem includes:    Ceftin 500 Mg Tabs (Cefuroxime axetil) ..... One tablet by mouth two times a day x 10 days  Complete Medication List: 1)  Multivitamins Tabs (Multiple vitamin) .... One by mouth once daily 2)  Benadryl 25 Mg Caps (Diphenhydramine hcl) .... One o at bedtime 3)  Calcium 500 500 Mg Tabs (Calcium carbonate) .... One by mouth once daily 4)  Welchol 625 Mg Tabs  (Colesevelam hcl) .... 2 tab by mouth two times a day 5)  Chlorthalidone 25 Mg Tabs (Chlorthalidone) .... One by mouth qd 6)  Fish Oil 1000 Mg Caps (Omega-3 fatty acids) .... Take 1 tablet by mouth once a day 7)  Omeprazole 20 Mg Tbec (Omeprazole) .... One by mouth qd 8)  Losartan Potassium 25 Mg Tabs (Losartan potassium) .... One by mouth once daily 9)  Desonide 0.05 % Crea (Desonide) .... Apply to affected area as needed two times a day 10)  Bystolic 5 Mg Tabs (Nebivolol hcl) .... One by mouth qd 11)  Pravastatin Sodium 40 Mg Tabs (Pravastatin sodium) .... Take 1 tab by mouth at bedtime 12)  Aspirin 81 Mg Tabs (Aspirin) .... Take 1 tablet by mouth once a day 13)  Zolpidem Tartrate 5 Mg Tabs (Zolpidem tartrate) .... One tab by mouth at bedtime as needed 14)  Ceftin 500 Mg Tabs (Cefuroxime axetil) .... One tablet by mouth two times a day x 10 days  Patient Instructions: 1)  Call if you develop fever over 101, increasing sinus pressure, pain with eye movement, increased facial tenderness of swelling, or if you develop visual changes. Prescriptions: CEFTIN 500 MG TABS (CEFUROXIME AXETIL) one tablet by mouth two times a day x 10 days  #20 x 0   Entered and Authorized by:   Lemont Fillers FNP   Signed by:   Lemont Fillers FNP on 04/08/2010   Method used:   Electronically to        [facility] (retail)       [address]       [place], [place]  [address]       Ph: [phone]       Fax: [phone]   RxID:   [id number]   Current Allergies (reviewed today): ! ERYTHROMYCIN ! SULFA

## 2010-09-15 NOTE — Progress Notes (Signed)
Summary: FYI  Phone Note Call from Patient Call back at Home Phone [phone]   Caller: Patient Call For: Willette Cluster Reason for Call: Talk to Nurse Details for Reason: FYI Summary of Call: Pt. called today stating she could not give a stool sample. She did start her antibiotics and is to call back in a week to report how she is doing. If you still feel she needs to do a stool sample, she said she would do it at that time. If you have any questions, please call. Initial call taken by: Schuyler Amor,  July 14, 2010 3:52 PM  Follow-up for Phone Call        The pt could not leave a sample yesterday and this Am when she tried to fill the container the stool was nothing but water, no stool matter at all.  Gunnar Fusi did tell her if she could not leave a stool sample to take the medictaion and call me in 1 week with a report .  At that time if she is worse we will deal with stool samples then.  It is a 2 hours round trip for her to come here.   Follow-up by: Joselyn Glassman,  July 14, 2010 4:54 PM  Additional Follow-up for Phone Call Additional follow up Details #1::        okay, understand. If not better after antibiotics she needs to let us know. Thanks Additional Follow-up by: Willette Cluster NP,  July 15, 2010 6:00 PM    Additional Follow-up for Phone Call Additional follow up Details #2::    I did urge the pt to call us if she is not feeling better.

## 2010-09-15 NOTE — Progress Notes (Signed)
Summary: CXR result  Phone Note Outgoing Call   Summary of Call: call pt -  CXR is normal Initial call taken by: D. Thomos Lemons DO,  March 09, 2010 5:20 PM  Follow-up for Phone Call        Pt notified.  Nicki Guadalajara Fergerson CMA Duncan Dull)  March 10, 2010 9:20 AM

## 2010-09-15 NOTE — Progress Notes (Signed)
Summary: REFILL--Pravastatin  Phone Note Refill Request Message from:  Fax from Pharmacy on July 16, 2010 10:03 AM  Refills Requested: Medication #1:  PRAVASTATIN SODIUM 40 MG TABS Take 1 tab by mouth at bedtime   Dosage confirmed as above?Dosage Confirmed   Brand Name Necessary? No   Supply Requested: 1 month   Last Refilled: 06/09/2010 [facility] [address] [place] [address]   Method Requested: Electronic Next Appointment Scheduled: NONE Initial call taken by: Roselle Locus,  July 16, 2010 10:04 AM    Prescriptions: PRAVASTATIN SODIUM 40 MG TABS (PRAVASTATIN SODIUM) Take 1 tab by mouth at bedtime  #30 Each x 4   Entered by:   Mervin Kung CMA (AAMA)   Authorized by:   D. Thomos Lemons DO   Signed by:   Mervin Kung CMA (AAMA) on 07/16/2010   Method used:   Electronically to        [facility] (retail)       [address]       [place], [place]  [address]       Ph: [phone]       Fax: [phone]   RxID:   [id number]

## 2010-09-15 NOTE — Progress Notes (Addendum)
Summary: Condition update  Phone Note Call from Patient Call back at Home Phone [phone]   Caller: Patient Call For: Willette Cluster Reason for Call: Talk to Nurse Summary of Call: Update: She has improved but not completely well. Finished the antibotic this morning. Initial call taken by: Karna Christmas,  July 21, 2010 4:07 PM  Follow-up for Phone Call        Called pt and let husband know that I will call the pt in the AM.  She was not in .  I asked him to have her call if her problems worsen. Follow-up by: Joselyn Glassman,  July 22, 2010 4:46 PM     Appended Document: Condition update Pt did not bring her stool samples to the lab from 07-14-2011. Pt called in on 07-21-2011 and said she has improved.  I cancelled the stool culture, Stool  O&P,  and Stool PCR in reports.

## 2010-09-15 NOTE — Progress Notes (Signed)
Summary: bystolice refill  Phone Note Refill Request Call back at [phone] Message from:  [facility] on January 27, 2010 3:49 PM  Refills Requested: Medication #1:  BYSTOLIC 5 MG TABS one by mouth qd   Dosage confirmed as above?Dosage Confirmed   Supply Requested: 1 month Received voice message from [facility] requesting refill on Bystolic as pt is there on vacation and needs med.  Next Appointment Scheduled: 02/2010 Initial call taken by: Mervin Kung CMA,  January 27, 2010 3:50 PM    Prescriptions: BYSTOLIC 5 MG TABS (NEBIVOLOL HCL) one by mouth qd  #30 x 0   Entered by:   Mervin Kung CMA   Authorized by:   D. Thomos Lemons DO   Signed by:   Mervin Kung CMA on 01/27/2010   Method used:   Telephoned to ...       [facility] (retail)       [address]       [place], [place]  [address]       Ph: [phone]       Fax: [phone]   RxID:   [id number]

## 2010-09-15 NOTE — Progress Notes (Signed)
Summary: refills-- pravastatin, chlorthalidone  Phone Note Refill Request Message from:  Fax from Pharmacy on November 03, 2009 8:44 AM  Refills Requested: Medication #1:  PRAVASTATIN SODIUM 40 MG TABS Take 1 tab by mouth at bedtime   Dosage confirmed as above?Dosage Confirmed   Brand Name Necessary? No   Supply Requested: 1 month   Last Refilled: 10/04/2009  Medication #2:  CHLORTHALIDONE 25 MG  TABS one by mouth qd   Dosage confirmed as above?Dosage Confirmed   Brand Name Necessary? No   Supply Requested: 1 month   Last Refilled: 09/26/2009 [facility] [address] [place] PHONE [phone] FAX [phone]   Method Requested: Electronic Next Appointment Scheduled: NONE Initial call taken by: Roselle Locus,  November 03, 2009 8:45 AM  Follow-up for Phone Call        Refills sent to pharmacy. Pt notified. State she cannot scheduled her f/u in June at this time. She wants to wait until closer to that time as she will know her scheduled better. Follow-up by: Mervin Kung CMA,  November 03, 2009 9:06 AM    Prescriptions: PRAVASTATIN SODIUM 40 MG TABS (PRAVASTATIN SODIUM) Take 1 tab by mouth at bedtime  #30 x 3   Entered by:   Mervin Kung CMA   Authorized by:   D. Thomos Lemons DO   Signed by:   Mervin Kung CMA on 11/03/2009   Method used:   Electronically to        [facility] (retail)       [address]       [place], [place]  [address]       Ph: [phone]       Fax: [phone]   RxID:   [id number] CHLORTHALIDONE 25 MG  TABS (CHLORTHALIDONE) one by mouth qd  #30 x 3   Entered by:   Mervin Kung CMA   Authorized by:   D. Thomos Lemons DO   Signed by:   Mervin Kung CMA on 11/03/2009   Method used:   Electronically to        [facility] (retail)       [address]       [place], [place]  [address]       Ph: [phone]       Fax: [phone]   RxID:    [id number]   Current Allergies: No known allergies

## 2010-09-15 NOTE — Progress Notes (Signed)
Summary: Cholorthalidone Refill  Phone Note Refill Request Message from:  Fax from Pharmacy on March 02, 2010 12:43 PM  Refills Requested: Medication #1:  CHLORTHALIDONE 25 MG  TABS one by mouth qd   Dosage confirmed as above?Dosage Confirmed   Brand Name Necessary? No   Supply Requested: 1 month   Last Refilled: 01/27/2010 Requesting refill to [facility]   Method Requested: Electronic Next Appointment Scheduled: 03/09/2010 Dr Artist Pais @ 2:15 pm Initial call taken by: Glendell Docker CMA,  March 02, 2010 12:44 PM  Follow-up for Phone Call        Pt leaving [place] in the morning, needs refill this afternoon [name] [name]  March 02, 2010 1:36 PM  Additional Follow-up for Phone Call Additional follow up Details #1::        ok to refill x 3 Additional Follow-up by: D. Thomos Lemons DO,  March 02, 2010 1:47 PM    Prescriptions: CHLORTHALIDONE 25 MG  TABS (CHLORTHALIDONE) one by mouth qd  #30 x 3   Entered by:   Glendell Docker CMA   Authorized by:   D. Thomos Lemons DO   Signed by:   Glendell Docker CMA on 03/02/2010   Method used:   Electronically to        [facility] (retail)       [address]       [place], [place]  [address]       Ph: [phone]       Fax: [phone]   RxID:   [id number]

## 2010-09-15 NOTE — Assessment & Plan Note (Signed)
Summary: 4 MONTH FOLLOW UP/DK rsc with pt/mhf   Vital Signs:  Patient profile:   70 year old female Height:      65 inches Weight:      171.75 pounds BMI:     28.68 O2 Sat:      97 % on Room air Temp:     98.2 degrees F oral Pulse rate:   69 / minute Pulse rhythm:   regular Resp:     18 per minute BP sitting:   110 / 60  (right arm) Cuff size:   large  Vitals Entered By: Glendell Docker CMA (March 09, 2010 2:19 PM)  O2 Flow:  Room air CC: Rm 3- 4 Month follow up  Is Patient Diabetic? No Pain Assessment Patient in pain? no       Does patient need assistance? Functional Status Self care Ambulation Normal   Primary Care Provider:  DThomos Lemons DO  CC:  Rm 3- 4 Month follow up .  History of Present Illness: 70 y/o female c/ o chronic dry cough , and sinus drainage no fever or chills htn - no dizziness.  no edema  returned from trip to [place] ambien did not work for her  Preventive Screening-Counseling & Management  Alcohol-Tobacco     Smoking Status: quit  Allergies: 1)  ! Erythromycin 2)  ! Sulfa  Past History:  Past Medical History: Current Problems:  CHEST PAIN (ICD-786.50)  HYPERTENSION (ICD-401.9)  HYPERLIPIDEMIA (ICD-272.4) ALLERGIC RHINITIS (ICD-477.9) GERD (ICD-530.81) SKIN CANCER, LEG (ICD-173.7) ANEMIA, HX OF (ICD-V12.3) POLIOMYELITIS, HX OF (ICD-V12.02) IRRITABLE BOWEL SYNDROME (ICD-564.1) COLLAGENOUS COLITIS (ICD-710.9) Family Hx of COLON CANCER (ICD-153.9) Polio - age 41 ( no significant neuromuscular deficit)  Family Hx of COLON CANCER (ICD-153.9) Skin cancer, left lower leg.  Past Surgical History: Appendectomy 1944 Breast Biopsy 1947, 05/2006- Benign Tonsillectomy 1944   Removal of skin cancer on left leg   colonoscopy  Family History: As above father had issues also with alcoholism Paternal grandmother had COLON cancer.  Sisters with hypertension.  Family History of Stomach Cancer: Maternal Grandfather Family History of  Heart Disease: Father, Grandfather  Father with CVA, hypertension Mother with PVD     Physical Exam  General:  alert, well-developed, and well-nourished.   Lungs:  normal respiratory effort and normal breath sounds.   Heart:  normal rate, regular rhythm, and no gallop.   Extremities:  No lower extremity edema    Impression & Recommendations:  Problem # 1:  COUGH, CHRONIC (ICD-786.2) CXR is negative.  I suspect ACE cough.  change to ARB Orders: T-2 View CXR, Same Day (71020.5TC)  Problem # 2:  HYPERTENSION (ICD-401.9) DC lisinopril - causing chronic cough  Her updated medication list for this problem includes:    Chlorthalidone 25 Mg Tabs (Chlorthalidone) ..... One by mouth qd    Losartan Potassium 25 Mg Tabs (Losartan potassium) ..... One by mouth once daily    Bystolic 5 Mg Tabs (Nebivolol hcl) ..... One by mouth qd  BP today: 110/60 Prior BP: 122/74 (12/16/2009)  Labs Reviewed: K+: 3.7 (02/05/2010) Creat: : 0.86 (02/05/2010)   Chol: 203 (09/12/2009)   HDL: 69.70 (09/12/2009)   LDL: 144 (03/31/2009)   TG: 73.0 (09/12/2009)  Complete Medication List: 1)  Multivitamins Tabs (Multiple vitamin) .... One by mouth once daily 2)  Benadryl 25 Mg Caps (Diphenhydramine hcl) .... One o at bedtime 3)  Calcium 500 500 Mg Tabs (Calcium carbonate) .... One by mouth once daily 4)  Welchol  625 Mg Tabs (Colesevelam hcl) .... 2 tab by mouth two times a day 5)  Chlorthalidone 25 Mg Tabs (Chlorthalidone) .... One by mouth qd 6)  Fish Oil 1000 Mg Caps (Omega-3 fatty acids) .... Take 1 tablet by mouth once a day 7)  Omeprazole 20 Mg Tbec (Omeprazole) .... One by mouth qd 8)  Losartan Potassium 25 Mg Tabs (Losartan potassium) .... One by mouth once daily 9)  Desonide 0.05 % Crea (Desonide) .... Apply to affected area as needed two times a day 10)  Bystolic 5 Mg Tabs (Nebivolol hcl) .... One by mouth qd 11)  Pravastatin Sodium 40 Mg Tabs (Pravastatin sodium) .... Take 1 tab by mouth at  bedtime 12)  Aspirin 81 Mg Tabs (Aspirin) .... Take 1 tablet by mouth once a day 13)  Zolpidem Tartrate 5 Mg Tabs (Zolpidem tartrate) .... One tab by mouth at bedtime as needed  Patient Instructions: 1)  Please schedule a follow-up appointment in 2 months. Prescriptions: LOSARTAN POTASSIUM 25 MG TABS (LOSARTAN POTASSIUM) one by mouth once daily  #30 x 5   Entered and Authorized by:   D. Thomos Lemons DO   Signed by:   D. Thomos Lemons DO on 03/09/2010   Method used:   Electronically to        [facility] (retail)       [address]       [place], [place]  [address]       Ph: [phone]       Fax: [phone]   RxID:   [id number]   Current Allergies (reviewed today): ! ERYTHROMYCIN ! SULFA      Immunization History:  Zostavax History:    Zostavax # 1:  zostavax (01/25/2006)

## 2010-09-15 NOTE — Letter (Signed)
Summary: [facility] Instructions  [facility]  [address] [place], [place] [address]   Phone: [phone]  Fax: [phone]       [name] [name]    October 26, 1940    MRN: [id number]       Procedure Day Dorna Bloom: Wednesday 01/14/10     Arrival Time: 10:00 am     Procedure Time: 11:00 am     Location of Procedure:                    _ x_   [facility] Center (4th Floor)  PREPARATION FOR COLONOSCOPY WITH MIRALAX  Starting 5 days prior to your procedure (01/09/10) do not eat nuts, seeds, popcorn, corn, beans, peas,  salads, or any raw vegetables.  Do not take any fiber supplements (e.g. Metamucil, Citrucel, and Benefiber). ____________________________________________________________________________________________________   THE DAY BEFORE YOUR PROCEDURE         DATE: 01/13/10 DAY: Tuesday  1   Drink clear liquids the entire day-NO SOLID FOOD  2   Do not drink anything colored red or purple.  Avoid juices with pulp.  No orange juice.  3   Drink at least 64 oz. (8 glasses) of fluid/clear liquids during the day to prevent dehydration and help the prep work efficiently.  CLEAR LIQUIDS INCLUDE: Water Jello Ice Popsicles Tea (sugar ok, no milk/cream) Powdered fruit flavored drinks Coffee (sugar ok, no milk/cream) Gatorade Juice: apple, white grape, white cranberry  Lemonade Clear bullion, consomm, broth Carbonated beverages (any kind) Strained chicken noodle soup Hard Candy  4   Mix the entire bottle of Miralax with 64 oz. of Gatorade/Powerade in the morning and put in the refrigerator to chill.  5   At 3:00 pm take 2 Dulcolax/Bisacodyl tablets.  6   At 4:30 pm take one Reglan/Metoclopramide tablet.  7  Starting at 5:00 pm drink one 8 oz glass of the Miralax mixture every 15-20 minutes until you have finished drinking the entire 64 oz.  You should finish drinking prep around 7:30 or 8:00 pm.  8   If you are nauseated, you may take the 2nd Reglan/Metoclopramide tablet  at 6:30 pm.        9    At 8:00 pm take 2 more DULCOLAX/Bisacodyl tablets.        THE DAY OF YOUR PROCEDURE      DATE:  01/14/10 DAY: Wednesday  You may drink clear liquids until 9:00 am  (2 HOURS BEFORE PROCEDURE).   MEDICATION INSTRUCTIONS  Unless otherwise instructed, you should take regular prescription medications with a small sip of water as early as possible the morning of your procedure.          OTHER INSTRUCTIONS  You will need a responsible adult at least 70 years of age to accompany you and drive you home.   This person must remain in the waiting room during your procedure.  Wear loose fitting clothing that is easily removed.  Leave jewelry and other valuables at home.  However, you may wish to bring a book to read or an iPod/MP3 player to listen to music as you wait for your procedure to start.  Remove all body piercing jewelry and leave at home.  Total time from sign-in until discharge is approximately 2-3 hours.  You should go home directly after your procedure and rest.  You can resume normal activities the day after your procedure.  The day of your procedure you should not:   Drive  Make legal decisions   Operate machinery   Drink alcohol   Return to work  You will receive specific instructions about eating, activities and medications before you leave.   The above instructions have been reviewed and explained to me by  Lamona Curl CMA Duncan Dull)  Dec 16, 2009 4:23 PM    I fully understand and can verbalize these instructions _____________________________ Date 12/16/09

## 2010-09-15 NOTE — Letter (Signed)
   [facility] [address] [place], [place]  [address]  [place] Phone: [phone]      June 02, 2010   Dawnita Brafford [address] [place], [place] [address]  RE:  LAB RESULTS  Dear  Ms. Bhavsar,  The following is an interpretation of your most recent lab tests.  Please take note of any instructions provided or changes to medications that have resulted from your lab work.  ELECTROLYTES:  Good - no changes needed  KIDNEY FUNCTION TESTS:  Good - no changes needed        Sincerely Yours,    Dr. Thomos Lemons  Appended Document:  mailed

## 2010-09-15 NOTE — Letter (Signed)
Summary: Patient Notice- Polyp Results   Gastroenterology  [address] [place], [place] [address]   Phone: [phone]  Fax: [phone]        January 15, 2010 MRN: [id number]    [name] [name] [address] [place], [place]  [address]    Dear [name],  I am pleased to inform you that the colon polyp(s) removed during your recent colonoscopy was (were) found to be benign (no cancer detected) upon pathologic examination.The polyp was hypeplastic ( not precancerous)  I recommend you have a repeat colonoscopy examination in 10_ years to look for recurrent polyps, as having colon polyps increases your risk for having recurrent polyps or even colon cancer in the future.  Should you develop new or worsening symptoms of abdominal pain, bowel habit changes or bleeding from the rectum or bowels, please schedule an evaluation with either your primary care physician or with me.  Additional information/recommendations:  _x_ No further action with gastroenterology is needed at this time. Please      follow-up with your primary care physician for your other healthcare      needs.  __ Please call [phone] to schedule a return visit to review your      situation.  __ Please keep your follow-up visit as already scheduled.  __ Continue treatment plan as outlined the day of your exam.  Please call us if you are having persistent problems or have questions about your condition that have not been fully answered at this time.  Sincerely,  Hart Carwin MD  This letter has been electronically signed by your physician.  Appended Document: Patient Notice- Polyp Results letter mailed.

## 2010-09-15 NOTE — Miscellaneous (Signed)
Summary: Lab Orders  Clinical Lists Changes  Orders: Added new Test order of T-Basic Metabolic Panel ([id number]) - Signed 

## 2010-09-15 NOTE — Letter (Signed)
    at [facility] [address] [place], [place]  [address]  [place] Phone: [phone]      February 11, 2010   Sapphire Marschke [address] [place], [place] [address]  RE:  LAB RESULTS  Dear  Ms. Rauls,  The following is an interpretation of your most recent lab tests.  Please take note of any instructions provided or changes to medications that have resulted from your lab work.  ELECTROLYTES:  Good - no changes needed  KIDNEY FUNCTION TESTS:  Good - no changes needed        Sincerely Yours,    Dr. Thomos Lemons  Appended Document:  Letter mailed.

## 2010-09-17 NOTE — Miscellaneous (Signed)
Summary: mammogram  Clinical Lists Changes  Observations: Added new observation of MAMMOGRAM: normal (08/26/2010 11:28)      Preventive Care Screening  Mammogram:    Date:  08/26/2010    Results:  normal

## 2010-10-16 ENCOUNTER — Telehealth: Payer: Self-pay | Admitting: Internal Medicine

## 2010-10-22 NOTE — Progress Notes (Signed)
Summary: refill-prilosec  Phone Note Refill Request Message from:  Fax from Pharmacy on October 16, 2010 9:13 AM  Refills Requested: Medication #1:  prilosec otc 20 mgcapsule 42 ta   Brand Name Necessary? No [facility] [address] [place] [address] fax [phone]   Method Requested: Electronic Next Appointment Scheduled: none Initial call taken by: Elba Barman,  October 16, 2010 9:19 AM  Follow-up for Phone Call        call placed to patient [phone] to verify refill request and remind patient of follow up appointment with labs. She has scheduled for 5/7 and will have labs drawn at [facility] They have been entered for the week of 12/14/2010 Follow-up by: Glendell Docker CMA,  October 16, 2010 10:13 AM    Prescriptions: OMEPRAZOLE 20 MG TBEC (OMEPRAZOLE) one by mouth qd  #42 x 3   Entered by:   Glendell Docker CMA   Authorized by:   D. Thomos Lemons DO   Signed by:   Glendell Docker CMA on 10/16/2010   Method used:   Electronically to        [facility] (retail)       [address]       [place], [place]  [address]       Ph: [phone]       Fax: [phone]   RxID:   [id number]

## 2010-12-09 ENCOUNTER — Encounter (INDEPENDENT_AMBULATORY_CARE_PROVIDER_SITE_OTHER): Payer: Medicare Other

## 2010-12-09 ENCOUNTER — Other Ambulatory Visit (INDEPENDENT_AMBULATORY_CARE_PROVIDER_SITE_OTHER): Payer: Medicare Other

## 2010-12-09 ENCOUNTER — Ambulatory Visit (INDEPENDENT_AMBULATORY_CARE_PROVIDER_SITE_OTHER): Payer: Medicare Other | Admitting: Vascular Surgery

## 2010-12-09 ENCOUNTER — Other Ambulatory Visit (INDEPENDENT_AMBULATORY_CARE_PROVIDER_SITE_OTHER): Payer: Medicare Other | Admitting: Internal Medicine

## 2010-12-09 DIAGNOSIS — I8310 Varicose veins of unspecified lower extremity with inflammation: Secondary | ICD-10-CM

## 2010-12-09 DIAGNOSIS — I10 Essential (primary) hypertension: Secondary | ICD-10-CM

## 2010-12-09 DIAGNOSIS — I83893 Varicose veins of bilateral lower extremities with other complications: Secondary | ICD-10-CM

## 2010-12-09 DIAGNOSIS — E785 Hyperlipidemia, unspecified: Secondary | ICD-10-CM

## 2010-12-09 LAB — BASIC METABOLIC PANEL
BUN: 18 mg/dL (ref 6–23)
CO2: 33 mEq/L — ABNORMAL HIGH (ref 19–32)
Chloride: 99 mEq/L (ref 96–112)
Glucose, Bld: 89 mg/dL (ref 70–99)
Potassium: 4 mEq/L (ref 3.5–5.1)

## 2010-12-09 LAB — LIPID PANEL
Cholesterol: 214 mg/dL — ABNORMAL HIGH (ref 0–200)
Total CHOL/HDL Ratio: 3

## 2010-12-09 LAB — HEPATIC FUNCTION PANEL
ALT: 26 U/L (ref 0–35)
AST: 24 U/L (ref 0–37)
Total Bilirubin: 0.6 mg/dL (ref 0.3–1.2)
Total Protein: 6.7 g/dL (ref 6.0–8.3)

## 2010-12-10 NOTE — Assessment & Plan Note (Signed)
OFFICE VISIT  [name], [name] H DOB:  02-02-1941                                       12/09/2010 ZOXWR#:[id number]  The patient presents today for continued discussion regarding venous hypertension.  This is somewhat worse on her right than left leg, but she is having significant symptoms bilaterally.  She has been extremely compliant with her graduated compression garments since my last visit with her, but continues to have pain.  She is quite active and enjoys aerobic exercise with walking, but has had to stop this and markedly reduce this due to leg pain.  She also states that climbing stairs is difficult due to leg pain.  She loves to garden and this has also had to be curtailed due to pain.  She does have no change in her physical findings.  She has marked varicosities over her anterior thigh extending down to her lateral calf  and also on her left anterior thigh and lateral knee.  She did undergo formal duplex today in our office.  This does show reflux in the anterior saphenous vein branch on the right and the main saphenous vein on the left.  There is reflux in the left great saphenous vein over the proximal thigh and then she has a large branch feeding off of this which is feeding the remainder of varicosities throughout her anterior thigh and calf.  She has mild reflux in the right popliteal and left common femoral vein.  I discussed options with the patient.  She clearly has failed conservative treatment.  I do think that she will have symptom relief with ablation of her right anterior saphenous branch and left  great saphenous vein.  She may require a staged stab phlebectomy of these very large varicosities throughout both legs should she fail to have complete relief with ablation of the proximal segments.  She will continue to wear compression garments and we will proceed with surgery once she has obtained insurance approval.    Larina Earthly,  M.D. Electronically Signed  TFE/MEDQ  D:  12/09/2010  T:  12/10/2010  Job:  [id number]  cc:   Barbette Hair. Artist Pais, DO

## 2010-12-14 ENCOUNTER — Other Ambulatory Visit: Payer: Self-pay | Admitting: Internal Medicine

## 2010-12-14 DIAGNOSIS — E785 Hyperlipidemia, unspecified: Secondary | ICD-10-CM

## 2010-12-14 DIAGNOSIS — I10 Essential (primary) hypertension: Secondary | ICD-10-CM

## 2010-12-16 NOTE — Procedures (Unsigned)
LOWER EXTREMITY VENOUS REFLUX EXAM  INDICATION:  Bilateral varicose veins.  EXAM:  Using color-flow imaging and pulse Doppler spectral analysis, the right and left common femoral, superficial femoral, popliteal, posterior tibial, greater and lesser saphenous veins are evaluated.  There is evidence suggesting deep venous insufficiency in the right and left lower extremities.  The right and left saphenofemoral junctions are not competent with Reflux of >517milliseconds. The right and left GSV is not competent with Reflux of >530milliseconds with the caliber as described below.  The right lower extremity incompetence involves an anterior saphenous vein with the measurements included on the following work sheet.  The right and left proximal small saphenous veins demonstrate competency.  GSV Diameter (used if found to be incompetent only)                                           Right    Left Proximal Greater Saphenous Vein           0.50 cm  1.08 cm Proximal-to-mid-thigh                     0.43 cm  0.38 cm Mid thigh                                 0.20 cm  0.20 cm Mid-distal thigh                          cm       cm Distal thigh                              0.44 cm  0.25 cm Knee                                      0.38 cm  0.23 cm  IMPRESSION: 1. Right and left great saphenous vein is not competent with reflux     >534milliseconds.  The right lower extremity incompetence involves     the anterior saphenous vein. 2. The left greater saphenous vein incompetence involves a lateral     branch of the greater saphenous vein. 3. The right and left greater saphenous vein is tortuous. 4. The deep venous system is not competent with reflux of     >557milliseconds. 5. The right and left small saphenous vein is competent.     ___________________________________________ Larina Earthly, M.D.  EM/MEDQ  D:  12/10/2010  T:  12/10/2010  Job:  [id number]

## 2010-12-17 ENCOUNTER — Encounter: Payer: Self-pay | Admitting: Internal Medicine

## 2010-12-21 ENCOUNTER — Encounter: Payer: Self-pay | Admitting: Internal Medicine

## 2010-12-21 ENCOUNTER — Ambulatory Visit (INDEPENDENT_AMBULATORY_CARE_PROVIDER_SITE_OTHER): Payer: Medicare Other | Admitting: Internal Medicine

## 2010-12-21 VITALS — BP 120/74 | HR 67 | Temp 97.8°F | Resp 18 | Ht 65.0 in | Wt 171.0 lb

## 2010-12-21 DIAGNOSIS — I10 Essential (primary) hypertension: Secondary | ICD-10-CM

## 2010-12-21 DIAGNOSIS — Z0289 Encounter for other administrative examinations: Secondary | ICD-10-CM

## 2010-12-21 MED ORDER — BISOPROLOL FUMARATE 5 MG PO TABS: ORAL_TABLET | ORAL | Status: DC

## 2010-12-21 MED ORDER — AMLODIPINE BESYLATE 2.5 MG PO TABS: 2.5000 mg | ORAL_TABLET | Freq: Every day | ORAL | Status: DC

## 2010-12-21 NOTE — Patient Instructions (Addendum)
Stop bystolic Please call our office with your blood pressure readings within 2 weeks

## 2010-12-21 NOTE — Progress Notes (Signed)
Subjective:    Patient ID: [name] [name], female    DOB: 1941-03-21, 70 y.o.   MRN: [id number]  HPI    Review of Systems  Past Medical History  Diagnosis Date  . Chest pain   . Hypertension   . Hyperlipidemia   . Allergic rhinitis   . GERD (gastroesophageal reflux disease)   . Cancer of skin of leg   . History of anemia   . History of poliomyelitis     Polio  age 50- no significant neuromuscular deficit  . IBS (irritable bowel syndrome)   . Collagenous colitis     History   Social History  . Marital Status: Married    Spouse Name: N/A    Number of Children: N/A  . Years of Education: N/A   Occupational History  . Not on file.   Social History Main Topics  . Smoking status: Former Games developer  . Smokeless tobacco: Not on file   Comment: quit in 1979  . Alcohol Use: Yes     glass of wine daily  . Drug Use: Not on file  . Sexually Active: Not on file   Other Topics Concern  . Not on file   Social History Narrative   Retired Film/video editor 2 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Married  Former Smoker quit 1989 Alcohol use-yes -  glasses of wine/day   son works for Event organ[employer] - she flies to [place] to visit her grandchildren    Past Surgical History  Procedure Date  . Appendectomy 1944  . Breast biopsy 1947 & 10/07    Benign  . Tonsillectomy 1994  . Skin cancer excision     removal of skin cancer on left leg  . Colonoscopy     Family History  Problem Relation Age of Onset  . Alcohol abuse Father   . Colon cancer Maternal Grandmother   . Hypertension Sister   . Stomach cancer Maternal Grandfather   . Heart disease Father   . Heart disease Paternal Grandfather   . Stroke Father   . Hypertension Father   . Peripheral vascular disease Mother     Allergies  Allergen Reactions  . Erythromycin     REACTION: Rash  . Sulfonamide Derivatives     REACTION: Hives    Current Outpatient Prescriptions on File Prior to Visit  Medication Sig Dispense Refill  . aspirin 81 MG tablet Take  81 mg by mouth daily.        . Calcium Carbonate (CALCIUM 500 PO) Take 500 mg by mouth daily.        . chlorthalidone (HYGROTON) 25 MG tablet Take 25 mg by mouth daily.        . colesevelam (WELCHOL) 625 MG tablet Take 1,875 mg by mouth 2 (two) times daily with a meal.        . desonide (DESOWEN) 0.05 % cream Apply topically 2 (two) times daily.        . diphenhydrAMINE (BENADRYL) 25 MG tablet Take 25 mg by mouth at bedtime as needed.        Marland Kitchen losartan (COZAAR) 25 MG tablet Take 25 mg by mouth daily.        . Multiple Vitamin (MULTIVITAMINS PO) Take by mouth daily.        . nebivolol (BYSTOLIC) 5 MG tablet Take 5 mg by mouth daily.        . Omega-3 Fatty Acids (FISH OIL) 1000 MG CAPS Take 1,000 mg by mouth daily.        Marland Kitchen  Omeprazole 20 MG TBEC Take 20 mg by mouth daily.        . pravastatin (PRAVACHOL) 40 MG tablet Take 40 mg by mouth at bedtime.        Marland Kitchen HYDROcodone-acetaminophen (NORCO) 5-325 MG per tablet Take 1 tablet by mouth every 6 (six) hours as needed.          BP 120/74  Pulse 67  Temp(Src) 97.8 F (36.6 C) (Oral)  Resp 18  Ht 5\' 5"  (1.651 m)  Wt 171 lb (77.565 kg)  BMI 28.46 kg/m2  SpO2 98%       Objective:   Physical Exam        Assessment & Plan:

## 2010-12-29 NOTE — Consult Note (Signed)
NEW PATIENT CONSULTATION   [name] [name], [name] [name]  DOB:  Jun 13, 1941                                       08/26/2010  JXBJY#:[id number]   [name] [name] presents today for evaluation of venous pathology more so in  the right leg than the left leg.  She has a long history of a  progressive varicosities in both legs.  This is significant in her the  right anterior thigh.  She does not have any history of DVT.  She does  have a history of venous ulcerations over her right medial ankle, most  recently in [name] 2011 which took 3 months to finally heal.  Her main  complaint is of a throbbing pain in her right groin and heaviness with  prolonged standing.  She does have bilateral lower extremity swelling,  worse on the right than on the left.  She does elevate her legs when  possible.  She does not have any history of bleeding from these.   PAST MEDICAL HISTORY:  Significant for hypertension and elevated  cholesterol.  She has no cardiac disease.   PAST SURGICAL HISTORY:  Positive for tonsillectomy and appendectomy.   SOCIAL HISTORY:  She is married with 2 children.  She is retired.  She  does not smoke having quit in 1989 and has 1 or 2 glasses of wine most  evenings.   FAMILY HISTORY:  Strongly positive for venous varicosities in her mother  and sister.  No history of premature atherosclerotic disease.   REVIEW OF SYSTEMS:  No weight loss or gain.  She weighs 167 pounds.  She  is 5 feet 6 inches tall.  VASCULAR:  Positive pain in her legs with walking and nonhealing ulcer.  CARDIAC:  Negative.  GI:  Positive for diarrhea.  NEUROLOGIC:  Negative.  PULMONARY:  Positive for a productive cough.  Hematologic GU, ENT is negative.  MUSCULOSKELETAL:  Positive for arthritis.  PSYCHIATRIC:  Negative.  SKIN:  Positive for prior venous ulcers.   PHYSICAL EXAMINATION:  Well-developed, well-nourished white female  appearing stated age in no acute stress.  Blood pressure is 138/78,  pulse 77, respirations 16.  HEENT:  Normal.  Her radial and dorsalis pedis pulses are 2+ bilaterally.  ABDOMEN:  Soft and nontender.  No masses noted.  MUSCULOSKELETAL:  Shows no major deformity or cyanosis.  NEUROLOGIC:  No focal weakness or paresthesias.  SKIN:  Without ulcers or rashes currently.  She does have an area of  scarring where she had healed ulcers in the medial __________  on the  right.  She has marked varicosities extending throughout her anterior  thigh over onto her pretibial area.   I imaged her veins with SonoSite ultrasound.  This does show a large  refluxing branch off of her anterior saphenous vein that extends into  these large varicosities.  Her great saphenous vein is of normal  caliber.   I discussed the significance of this with Ms. Bordenave.  I explained that  this is not dangerous.  I am concerned both from the discomfort in her  groin which I feel is most likely related to this large refluxing great  anterior branch of her saphenous vein and also clearly she is having  venous stasis ulcerations at the level of her ankle.  She has not worn  prescription grade graduated compression  garments.  We have fitted her  with these today, 20-30 mmHg with thigh-high compression and instructed  her on the use of these bilaterally.  We will see her again in 3 months  for continued discussion.  She would be a candidate for ablation of her  anterior branch of her saphenous vein and potential stab phlebectomy for  relief of symptoms and improvement of her venous hypertension to reduce  her risk for recurrent stasis ulceration.     [name] [name], M.D.  Electronically Signed   TFE/MEDQ  D:  08/26/2010  T:  08/27/2010  Job:  [id number]

## 2011-01-01 NOTE — Assessment & Plan Note (Signed)
[facility]                         GASTROENTEROLOGY OFFICE NOTE   NAME:[name], [name] MEE                          MRN:          [id number]  DATE:08/23/2006                            DOB:          05/18/1941    HISTORY:  [name] [name] is a 70 year old white female with a history of  collagenous colitis, who has recurrent diarrhea.  She was seen in 1988,  and subsequently in 1994, for diarrheal illness.  A colonoscopy at that  time showed collagenous colitis.  The diarrhea subsided after treatment  with Cipro.  Later on in 2006, her diarrhea recurred and a repeated  colonoscopy at this time revealed no evidence of collagenous colitis.  Her symptoms were more consistent with irritable bowel syndrome.  She  did well until several months ago, when the diarrhea recurred.  She is  having crampy abdominal pain and watery to loose stools, up to 10 a day,  sometimes at night as well.  There has been no weight loss.  There are  no upper GI symptoms of nausea or vomiting.  She has a positive family  history of colon cancer in her paternal grandmother.  She has occasional  rectal bleeding.  The stools are looser and more frequent when she is  stressed out.   MEDICATIONS:  1. Multivitamins.  2. Fish oil.  3. Calcium.  4. Aspirin.  5. Benadryl 25 mg q.h.s.  6. Vagifem.   PAST MEDICAL HISTORY:  1. Significant for polio at age 64.  2. Appendectomy.   FAMILY HISTORY:  Positive for colon cancer in the grandparents.  Heart  disease in both parents.   SOCIAL HISTORY:  Married, two children.  Retired Runner, broadcasting/film/video.  She is an ex-  smoker since 1989.  Drinks alcohol occasionally.   REVIEW OF SYSTEMS:  Positive for hyperlipidemia.   PHYSICAL EXAMINATION:  VITAL SIGNS:  Blood pressure 138/90, pulse 88,  weight 170 pounds.  GENERAL:  She is alert and oriented, in no distress.  LUNGS:  Clear to auscultation.  COR:  Normal S1, S2.  ABDOMEN:  Soft, with normoactive bowel sounds.   No tenderness, no  distention.  RECTAL:  Exam was normal.  Stool was Hemoccult negative.   IMPRESSION:  27. A 70 year old white female, with a history of collagenous colitis      and recurrent diarrhea.  Rule out recurrent collagenous colitis.  2. Irritable bowel syndrome by history.  3. Positive family history of colon cancer in a grandparent.  4. History of hyperlipidemia.  5. The patient needs a primary care physician.   PLAN:  1. Cipro 250 mg p.o. b.i.d. for two weeks, with one refill.  2. Levbid 0.375 mg p.o. b.i.d., dispense #60.  3. Consider using Entocort if the diarrhea continues.  4. Refer to primary M.D. in our office.  5. I will see her again in eight weeks, if there is no improvement.      We also should check a tissue transglutaminase levels.     Hedwig [name]. Juanda Chance, MD  Electronically Signed    DMB/MedQ  DD: 08/23/2006  DT: 08/23/2006  Job #: [id number]   cc:   Guy Sandifer. [place] Cloud, M.D.

## 2011-01-01 NOTE — Assessment & Plan Note (Signed)
[facility]                         GASTROENTEROLOGY OFFICE NOTE   NAME:[name] [name] BUCHINGER                          MRN:          [id number]  DATE:11/16/2006                            DOB:          09-04-1940    [name] [name] is a 70 year old white female with collagenous colitis,  diagnosed in 2001 on colonoscopy.  Last colonoscopy in 2006 did not  confirm presence of collagenous colitis, but she just had a flare-up  several months ago and has responded to Entocort, initial dose 9 mg a  day, which was slowly tapered down to 6 mg a day and last week, she was  able to decrease her dose to 3 mg a day.  She is in remission, having  one bowel movement daily, no crampy abdominal pain, no urgency.  She is  very happy with her condition.   PHYSICAL EXAM:  Blood pressure 122/72, pulse 68 and weight 171 pounds.  She gained almost ten pounds on the steroids.  Patient was not examined  today.   IMPRESSION:  A 70 year old white female with collagenous colitis by  history, currently in remission, after her brief course of Entocort.   PLAN:  Continue Entocort 3 mg a day for additional one week, then  discontinue.  If symptoms recur, she is free to start Entocort 3 mg  daily for a week or two.  If the symptoms still continue, she will call  us.  Next colonoscopy would be in seven years from the initial one in  April 2006, that is in April 2013.     Hedwig Morton. Juanda Chance, MD  Electronically Signed    DMB/MedQ  DD: 11/16/2006  DT: 11/16/2006  Job #: [id number]   cc:   Barbette Hair. Artist Pais, DO

## 2011-01-01 NOTE — Assessment & Plan Note (Signed)
[facility]                           PRIMARY CARE OFFICE NOTE   NAME:[name], [name] BOUCHIE                          MRN:          [id number]  DATE:10/04/2006                            DOB:          05-03-1941    CHIEF COMPLAINT:  New patient to the practice, patient referred by Dr.  Lina Sar.   HISTORY OF PRESENT ILLNESS:  Patient is 70 year old white female here to  establish her primary care. She is followed by Dr. Juanda Chance at [facility]  for history of collagenous colitis with recent flair, she has been on  Entocort taper which has significant helped her diarrhea symptoms.   Her other history is significant for possible irritable bowel syndrome,  she also had polio at age 70 with no significant residual deficits or  post polio syndrome.   Her primary care duties have been followed by Dr. [place] Cloud her  gynecologist, he has preformed recent blood work and he has been  following her cholesterol levels. Most recent test obtained August 05, 2006 shows total cholesterol level 243, triglycerides 76, HDL 66, and  LDL of 162. She does not have any history of heart disease, stroke, or  peripheral vascular disease.   FAMILY HISTORY:  Pertinent with father with history of CVA but he was a  smoker, he also had heart disease, but later in life.   We reviewed her diet and she does not consume a lot of animal protein,  however, she does enjoy cheese on a regular basis.   PAST MEDICAL HISTORY SUMMARY:  1. Collagenous colitis with recent flare.  2. Irritable bowel syndrome.  3. History of polio at age 70.  4. Hyperlipidemia.  5. History of anemia.  6. Skin cancer, left lower leg.  7. Status post appendectomy 1944.  8. [place] biopsy 1947, and also October 2007 which was benign.  9. Tonsils also 1944.   CURRENT MEDICATIONS:  1. Multivitamin 1 a day.  2. Fish oil 1 a day.  3. Calcium 1 a day.  4. Aspirin 81 mg once a day.  5. Benadryl 25 mg q.h.s.  6.  __________Twice a week.  7. Prednisone taper.  8. Entocort.  9. Less then 0.357 mg p.r.n.   ALLERGIES TO MEDICATIONS:  INCLUDE ERYTHROMYCIN WHICH CAUSES RASH AND  SULFA WHICH CAUSES HIVES.   SOCIAL HISTORY:  Patient is married, she is a retired Runner, broadcasting/film/video. 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   HABITS:  Ex smoker since 1989. She has 1 to 2 glasses of wine per  evening.   FAMILY HISTORY:  As above father had issues also with alcoholism,  paternal grandmother had colon cancer, paternal grandfather early  coronary artery disease. She has several sisters with hypertension.   REVIEW OF SYSTEMS:  No fevers, chills, no HEENT symptoms, she notes that  bell pepper and fresh fruits and vegetables upset her colitis. No  history of chest pain, shortness of breath. All other systems negative.   PHYSICAL EXAMINATION:  VITAL SIGNS: She is 5 feet 2.5 inches, weight is  174 pounds, temperature is 98,  pulse is 70, blood pressure is 142/69 on  left arm in seated position.  IN GENERAL: The patient is a very pleasant well-developed, well-  nourished, 70 year old white female in no apparent distress.  HEENT: Normocephalic, atraumatic, pupils were equal and reactive to  light bilaterally, extraocular __________was intact, patient was  anicteric, __________ exam was within normal limits. External auditory  canals, and tympanic membranes were clear bilaterally. Oropharyngeal  exam was unremarkable.  NECK: Supple, no adenopathy, carotid bruits, or thyromegaly.  CHEST EXAM: Normal respiratory effort, chest was clear to auscultation  bilaterally. No rhonchi, rales, or wheezing.  CARDIOVASCULAR: Regular rate and rhythm no significant murmurs, rubs, or  gallops appreciated.  ABDOMEN: Soft, nontender, positive bowel sounds, no organomegaly.  MUSCULOSKELETAL EXAM: No clubbing, cyanosis, or edema.  SKIN: Warm and dry.  NEUROLOGIC: Cranial nerves II-XII grossly intact, she was nonfocal.   IMPRESSION/RECOMMENDATIONS:  1.  Hyperlipidemia/primary prevention.  2. History of collagenous colitis, with recent excerebration.  3. Irritable bowel syndrome.  4. History of anemia.  5. History of skin cancer.  6. Health maintenance.   RECOMMENDATIONS:  She defers starting statin at this time. We discussed  decreasing cheese intake. We will repeat her lipid profile in  approximately 6 to 8 weeks. She is not a high risk patient, no history  of heart disease, type 2 diabetes, or peripheral vascular disease. Goal  of dietary therapy would be an LDL of less than 130.   She is to follow up with Dr. Juanda Chance regarding collagenous colitis. She  will further taper her Entocort, if prednisone is utilized frequently we  will consider checking bone density scan to obtain baseline.   Follow up time is in approximately 2 to 3 months.     Barbette Hair. Artist Pais, DO  Electronically Signed    RDY/MedQ  DD: 10/04/2006  DT: 10/05/2006  Job #: [id number]

## 2011-01-01 NOTE — Op Note (Signed)
[place]. [facility]  Patient:    [name] [name]                        MRN: [id number] Proc. Date: 08/28/99 Adm. Date:  [id number] Attending:  Mervin Hack CC:         Hedwig Morton. Juanda Chance, M.D. [facility]                           Operative Report  PROCEDURE:  Colonoscopy.  INDICATION:  This 70 year old white female has had chronic intermittent diarrhea. She has had several episodes recently which interfered with work as a Runner, broadcasting/film/video. She has taken Imodium with some improvement.  She had some bright red blood per rectum at times.  She has had occasional nocturnal diarrhea with watery stools.  She initially improved on Cipro 250 mg twice a day but then it recurred.  Physical exam on August 12, 1999, abdomen was negative.  Stool was negative for blood.  She has been treated for irritable bowel syndrome and is undergoing colonoscopy to evaluate for possible inflammatory bowel disease.  INSTRUMENTS:  Olympus single-channel endoscope.  PREMEDICATION:  Versed 10 mg IV and Demerol 100 mg IV.  DESCRIPTION OF PROCEDURE:  Olympus single-channel endoscope passed directly into the rectum into the sigmoid colon.  The patient was monitored by pulse oximeter. Her oxygen saturations were normal.  Her prep was excellent.  Anal canal and rectal ampulla was unremarkable.  Sigmoid colon was normal. There were no diverticuli.  Haustral folds were unremarkable.  Mucosa showed no evidence of inflammation.  Descending colon, splenic flexure, transverse colon, and hepatic flexure was normal.  Ascending colon was normal and clean in the cecal area, cecal valve.  Colonoscope was then retracted after video photograph of the cecal pouch were obtained.  Multiple random biopsies were taken from the right transverse and left colon to rule out collagenous colitis.  The patient tolerated the procedure well.  IMPRESSION:  Normal colonoscopy to the cecum, status post random  biopsies.  PLAN:  Treat for irritable bowel syndrome with antispasmodic high fiber diet. Fiber supplement.  Await results of the biopsies. DD:  08/28/99 TD:  08/28/99 Job: [id number]

## 2011-01-06 ENCOUNTER — Other Ambulatory Visit (INDEPENDENT_AMBULATORY_CARE_PROVIDER_SITE_OTHER): Payer: Medicare Other | Admitting: Vascular Surgery

## 2011-01-06 DIAGNOSIS — I83893 Varicose veins of bilateral lower extremities with other complications: Secondary | ICD-10-CM

## 2011-01-06 NOTE — Assessment & Plan Note (Signed)
OFFICE VISIT  Chain, Ever H DOB:  1940-09-27                                       01/06/2011 EAVWU#:[id number]  Patient underwent uneventful ablation of her right anterior branch great saphenous vein under local anesthetic.  She had no immediate complication and will be seen again in 1 week for follow-up.    Larina Earthly, M.D. Electronically Signed  TFE/MEDQ  D:  01/06/2011  T:  01/06/2011  Job:  [id number]

## 2011-01-13 ENCOUNTER — Ambulatory Visit (INDEPENDENT_AMBULATORY_CARE_PROVIDER_SITE_OTHER): Payer: Medicare Other | Admitting: Vascular Surgery

## 2011-01-13 ENCOUNTER — Encounter (INDEPENDENT_AMBULATORY_CARE_PROVIDER_SITE_OTHER): Payer: Medicare Other

## 2011-01-13 DIAGNOSIS — I83893 Varicose veins of bilateral lower extremities with other complications: Secondary | ICD-10-CM

## 2011-01-13 DIAGNOSIS — Z48812 Encounter for surgical aftercare following surgery on the circulatory system: Secondary | ICD-10-CM

## 2011-01-13 NOTE — Assessment & Plan Note (Signed)
OFFICE VISIT  [name] [name], [name] [name] DOB:  June 13, 1941                                       01/13/2011 ZOXWR#:[id number]  Patient presents today 1 week follow-up of laser ablation of her right great saphenous vein.  She did quite well with the procedure and has been compliant with her compression garments as directed.  She has minimal bruising at the site of her ablation and mild discomfort.  She underwent a repeat duplex office in our office today, and this reveals closure of her saphenous vein from the insertion site in her anterior thigh up to just below the saphenofemoral junction with no evidence of DVT.  I am quite pleased with her initial result, as is patient.  We will see her again in mid July for a similar treatment for venous reflux in her left great saphenous vein.    Larina Earthly, M.D. Electronically Signed  TFE/MEDQ  D:  01/13/2011  T:  01/13/2011  Job:  [id number]  cc:   Barbette Hair. Artist Pais, DO

## 2011-01-20 NOTE — Procedures (Unsigned)
DUPLEX DEEP VENOUS EXAM - LOWER EXTREMITY  INDICATION:  Status post right GSV and ASV ablation, 01/06/2011.  HISTORY:  Edema:  Yes. Trauma/Surgery:  Yes. Pain:  Yes. PE:  No. Previous DVT:  No. Anticoagulants:  No. Other:  DUPLEX EXAM:               CFV   SFV   PopV  PTV    GSV               R  L  R  L  R  L  R   L  R  L Thrombosis    o     o     o     o      + Spontaneous   +     +     +     +      o Phasic        +     +     +     +      o Augmentation  +     +     +     +      o Compressible  +     +     +     +      o Competent     +     +     +     +      o  Legend:  + - yes  o - no  p - partial  D - decreased  IMPRESSION:  Apparently successful ablation of the right greater saphenous vein and accessory saphenous vein in the proximal thigh without evidence of deep venous involvement.   _____________________________ Larina Earthly, M.D.  LT/MEDQ  D:  01/13/2011  T:  01/13/2011  Job:  [id number]

## 2011-01-21 ENCOUNTER — Telehealth: Payer: Self-pay | Admitting: Internal Medicine

## 2011-01-21 NOTE — Telephone Encounter (Signed)
Refill pravastatin 40 mg tab qty 30 take 1 tablet at bedtime last fill 12-19-2010

## 2011-01-22 MED ORDER — PRAVASTATIN SODIUM 40 MG PO TABS: 40.0000 mg | ORAL_TABLET | Freq: Every day | ORAL | Status: DC

## 2011-01-22 NOTE — Telephone Encounter (Signed)
Rx refill sent to pharmacy. 

## 2011-03-03 ENCOUNTER — Other Ambulatory Visit (INDEPENDENT_AMBULATORY_CARE_PROVIDER_SITE_OTHER): Payer: Medicare Other | Admitting: Vascular Surgery

## 2011-03-03 DIAGNOSIS — I83893 Varicose veins of bilateral lower extremities with other complications: Secondary | ICD-10-CM

## 2011-03-04 NOTE — Assessment & Plan Note (Signed)
OFFICE VISIT  [name], [name] H DOB:  Jul 30, 1941                                       03/03/2011 ZOXWR#:[id number]  [name] [name] presents today for treatment of her left leg venous hypertension.  She underwent uneventful ablation of her left great saphenous vein from just below the knee to just below the saphenofemoral junction.  This was with tumescent anesthesia with no immediate complications.  She will be seen again in 1 week with duplex followup.    Larina Earthly, M.D. Electronically Signed  TFE/MEDQ  D:  03/03/2011  T:  03/04/2011  Job:  [id number]

## 2011-03-08 ENCOUNTER — Ambulatory Visit (INDEPENDENT_AMBULATORY_CARE_PROVIDER_SITE_OTHER): Payer: Medicare Other | Admitting: Vascular Surgery

## 2011-03-08 ENCOUNTER — Encounter (INDEPENDENT_AMBULATORY_CARE_PROVIDER_SITE_OTHER): Payer: Medicare Other

## 2011-03-08 DIAGNOSIS — Z48812 Encounter for surgical aftercare following surgery on the circulatory system: Secondary | ICD-10-CM

## 2011-03-08 DIAGNOSIS — I87399 Chronic venous hypertension (idiopathic) with other complications of unspecified lower extremity: Secondary | ICD-10-CM

## 2011-03-08 DIAGNOSIS — I83893 Varicose veins of bilateral lower extremities with other complications: Secondary | ICD-10-CM

## 2011-03-09 NOTE — Assessment & Plan Note (Signed)
OFFICE VISIT  [name] [name], [name] [name] DOB:  Sep 13, 1940                                       03/08/2011 NWGNF#:[id number]  The patient returns 5 days post laser ablation of her left great saphenous vein performed by Dr. Arbie Cookey on July 18 for painful varicosities in the left leg secondary to gross reflux in the left great saphenous system from valvular incompetence.  She has had no distal edema and has had only minimal discomfort along the course of the great saphenous vein mostly at the distal thigh and proximal calf area near the flexion crease.  She has had no chest pain, dyspnea on exertion, hemoptysis or other problems.  She has been wearing her long-leg elastic stocking and taking ibuprofen as instructed.  PHYSICAL EXAM:  Vital signs:  Blood pressure 155/57, heart rate 63, respirations 16.  General:  She is a well-developed, well-nourished female who is alert and oriented x3.  Lower extremities:  Exam reveals 3+ femoral and dorsalis pedis pulses bilaterally.  Left leg has mild discomfort along the course of the great saphenous vein with no ecchymosis or blistering of the skin.  New residual varicosities are present in the calf area in the pretibial and lateral areas.  There is no distal edema, hyperpigmentation or ulceration.  Today we ordered a venous duplex exam which I reviewed and interpreted. The great saphenous vein is totally ablated from the proximal calf to the saphenofemoral junction and there is no DVT.  I reassured her regarding these findings.  She will return in 3 months to see Dr. Arbie Cookey regarding possible stab phlebectomies for residual varicosities.    Quita Skye Hart [place], M.D. Electronically Signed  JDL/MEDQ  D:  03/08/2011  T:  03/09/2011  Job:  [id number]

## 2011-03-17 NOTE — Procedures (Unsigned)
DUPLEX DEEP VENOUS EXAM - LOWER EXTREMITY  INDICATION:  Left greater saphenous vein laser ablation.  HISTORY:  Edema:  Yes. Trauma/Surgery:  Right greater saphenous vein laser ablation on 01/06/2011, left greater saphenous vein laser ablation on 03/03/2011. Pain:  Yes. PE:  No. Previous DVT:  No. Anticoagulants: Other:  DUPLEX EXAM:               CFV   SFV   PopV  PTV    GSV               R  L  R  L  R  L  R   L  R  L Thrombosis    o  o     o     o      o     + Spontaneous   +  +     +     +      +     o Phasic        +  +     +     +      +     o Augmentation  +  +     +     +      +     o Compressible  +  +     +     +      +     o Competent     o  o     +     o      +     o  Legend:  + - yes  o - no  p - partial  D - decreased  IMPRESSION: 1. No evidence of deep vein thrombosis noted in the left lower     extremity. 2. The left greater saphenous vein appears totally occluded from the     distal insertion site to near the saphenofemoral junction. 3. Reflux of >500 milliseconds is noted in the right common femoral,     left superficial femoral and left popliteal veins.   _____________________________ Quita Skye Hart [place], M.D.  CH/MEDQ  D:  03/12/2011  T:  03/12/2011  Job:  [id number]

## 2011-03-22 ENCOUNTER — Telehealth: Payer: Self-pay | Admitting: Internal Medicine

## 2011-03-22 NOTE — Telephone Encounter (Signed)
Refill- pravastatin na 40mg  tab. Take one tablet by mouth at bedtime for cholesterol. Qty 30. Last fill 7.6.12  Refill- losartan potassium 25mg  ta. Take one tablet by mouth every day. Qty 30. Last fill 7.6.12

## 2011-03-23 MED ORDER — LOSARTAN POTASSIUM 25 MG PO TABS: 25.0000 mg | ORAL_TABLET | Freq: Every day | ORAL | Status: DC

## 2011-03-23 MED ORDER — PRAVASTATIN SODIUM 40 MG PO TABS: 40.0000 mg | ORAL_TABLET | Freq: Every day | ORAL | Status: DC

## 2011-03-23 NOTE — Telephone Encounter (Signed)
Rx refill sent to [facility]

## 2011-03-25 ENCOUNTER — Ambulatory Visit (INDEPENDENT_AMBULATORY_CARE_PROVIDER_SITE_OTHER): Payer: Medicare Other | Admitting: Physician Assistant

## 2011-03-25 ENCOUNTER — Encounter: Payer: Self-pay | Admitting: Physician Assistant

## 2011-03-25 ENCOUNTER — Telehealth: Payer: Self-pay | Admitting: Internal Medicine

## 2011-03-25 VITALS — BP 132/80 | HR 68 | Ht 66.5 in | Wt 169.6 lb

## 2011-03-25 DIAGNOSIS — K5289 Other specified noninfective gastroenteritis and colitis: Secondary | ICD-10-CM

## 2011-03-25 DIAGNOSIS — R112 Nausea with vomiting, unspecified: Secondary | ICD-10-CM | POA: Insufficient documentation

## 2011-03-25 DIAGNOSIS — K52831 Collagenous colitis: Secondary | ICD-10-CM

## 2011-03-25 DIAGNOSIS — R197 Diarrhea, unspecified: Secondary | ICD-10-CM

## 2011-03-25 MED ORDER — BUDESONIDE 3 MG PO CP24: 9.0000 mg | ORAL_CAPSULE | Freq: Every day | ORAL | Status: DC

## 2011-03-25 NOTE — Patient Instructions (Signed)
Entocort 3 mg 3 tablets daily  Prescription sent to your pharmacy. Follow-up with Dr. Juanda Chance September 24th at 3:30 pm Continue taking Imodium up to 4 tablets daily as needed for diarrhea. Call if you have any problems.

## 2011-03-25 NOTE — Telephone Encounter (Signed)
Patient states for the last month, she has had problems with diarrhea. It is getting worse. She has tried diet change, stopped Fish Oil and Imodium without relief. She states she is having 5-6 watery stools/day. She gets up at night with diarrhea. Denies cramping or being out of the country. She does have a little bleeding from hemorrhoids. [place] today and wants to be seen. Scheduled patient with Willette Cluster, NP today at 3:45 PM.

## 2011-03-26 ENCOUNTER — Telehealth: Payer: Self-pay | Admitting: *Deleted

## 2011-03-26 ENCOUNTER — Encounter: Payer: Self-pay | Admitting: Physician Assistant

## 2011-03-26 MED ORDER — FAMOTIDINE 40 MG PO TABS: ORAL_TABLET | ORAL | Status: DC

## 2011-03-26 NOTE — Progress Notes (Signed)
Subjective:    Patient ID: [name] [name], female    DOB: [birth year], 70 y.o.   MRN: [id number]  HPI [name] [name] is very nice 71 year old white female known to Dr. Juanda Chance with history of collagenous colitis, initially diagnosed in 1988 with several subsequent exacerbations. She had normal colonoscopy in 2006 and had repeat colonoscopy in June of 2011 with finding of one diminutive polyp in an otherwise normal exam.  From the colon was benign without any evidence of inflammation and her polyp was hyperplastic. Patient does have family history of colon cancer in her maternal grandmother.  [name] [name] was last seen several months ago after she had taken a trip to [place] to visit her son. She became ill with diarrhea, "dysentery" while there and did have some Cipro which she took a few days' worth of. After returning home she had a relapse of diarrhea and then to a course of Cipro and Flagyl for about a week. After that course of antibiotic she said all of her symptoms resolved and she did very well for a couple of months She is concerned because her symptoms are progressing and also because she has a couple of vacations planned coming up, one  out of the country again to [place]..    Review of Systems  Constitutional: Negative.   HENT: Negative.   Eyes: Negative.   Respiratory: Negative.   Cardiovascular: Negative.   Gastrointestinal: Positive for diarrhea.  Genitourinary: Negative.   Musculoskeletal: Negative.   Skin: Negative.   Neurological: Negative.   Hematological: Negative.   Psychiatric/Behavioral: Negative.    Outpatient Prescriptions Prior to Visit  Medication Sig Dispense Refill  . amLODipine (NORVASC) 2.5 MG tablet Take 1 tablet (2.5 mg total) by mouth daily.  30 tablet  5  . aspirin 81 MG tablet Take 81 mg by mouth daily.        . bisoprolol (ZEBETA) 5 MG tablet 1/2 tablet once daily  30 tablet  3  . Calcium Carbonate (CALCIUM 500 PO) Take 500 mg by mouth daily.        . chlorthalidone  (HYGROTON) 25 MG tablet Take 25 mg by mouth daily.        Marland Kitchen desonide (DESOWEN) 0.05 % cream Apply topically 2 (two) times daily.        . diphenhydrAMINE (BENADRYL) 25 MG tablet Take 25 mg by mouth at bedtime as needed.        Marland Kitchen losartan (COZAAR) 25 MG tablet Take 1 tablet (25 mg total) by mouth daily.  30 tablet  3  . Multiple Vitamin (MULTIVITAMINS PO) Take by mouth daily.        . Omega-3 Fatty Acids (FISH OIL) 1000 MG CAPS Take 1,000 mg by mouth daily.        . Omeprazole 20 MG TBEC Take 20 mg by mouth daily.        . pravastatin (PRAVACHOL) 40 MG tablet Take 1 tablet (40 mg total) by mouth at bedtime.  30 tablet  3  . Probiotic Product (PROBIOTIC FORMULA PO) Take by mouth daily.        . colesevelam (WELCHOL) 625 MG tablet Take 1,875 mg by mouth 2 (two) times daily with a meal.        . HYDROcodone-acetaminophen (NORCO) 5-325 MG per tablet Take 1 tablet by mouth every 6 (six) hours as needed.             Objective:   Physical Exam Well-developed white female in no acute  distress, pleasant, alert and oriented x3 HEENT; nontraumatic normocephalic EOMI PERRLA sclera anicteric  Neck;Supple no JVD Cardiovascular; regular rate and rhythm with S1-S2 no murmur rub or gallop, Pulmonary; clear bilaterally, Abdomen; soft nondistended basically nontender no palpable mass or hepatosplenomegaly, bowel sounds active Rectal; not done, Extremities; skin benign no edema, Psych; mood and affect normal and appropriate        Assessment & Plan:  #60 70 year old female with history of collagenous colitis which has been in remission, now presenting with gradually progressive diarrhea over the past few months, up to 10 bowel movements per day currently.  Plan; I do not think her current symptoms represent an infectious etiology, and/or more consistent with an exacerbation of her colitis. Start Entocort 3 mg, 3 tablets daily in a.m. Low-residue diet Continue Imodium as needed Plan followup with Dr. Juanda Chance in  approximately one month. After reviewing up to date which are regarding medications implicated in collagenous colitis, she is taking omeprazole , which is a potential culprit. Will ask her to stop this and substitute Pepcid a.c. twice daily.

## 2011-03-26 NOTE — Telephone Encounter (Signed)
Message copied by Daphine Deutscher on Fri Mar 26, 2011  2:47 PM ------      Message from: [place], [place]      Created: Fri Mar 26, 2011  2:19 PM      Regarding: Weible,[name] [name]       I left a message for this pt to stop her omeprazole and start pepcid. Can you please sent an rx to her [place] pharmacy for pepcid 40 mg twice daily # 60 /11 refills, thanks

## 2011-03-26 NOTE — Progress Notes (Signed)
Reviewed, if diarrhea persists beyond 10-14 days, would check for C.Diff ( was on CIpro), Lactoferin, C&S  DB

## 2011-03-29 NOTE — Progress Notes (Signed)
OK I agree.DB

## 2011-03-29 NOTE — Progress Notes (Signed)
[name] [name], pt took the cipro back in [place] did have an infectious workup after that early in the year, all negative

## 2011-04-05 ENCOUNTER — Ambulatory Visit: Payer: Medicare Other | Admitting: Internal Medicine

## 2011-04-08 ENCOUNTER — Ambulatory Visit (INDEPENDENT_AMBULATORY_CARE_PROVIDER_SITE_OTHER): Payer: Medicare Other | Admitting: Internal Medicine

## 2011-04-08 ENCOUNTER — Encounter: Payer: Self-pay | Admitting: Internal Medicine

## 2011-04-08 VITALS — BP 116/74 | HR 64 | Temp 97.8°F | Resp 18 | Ht 66.5 in | Wt 170.0 lb

## 2011-04-08 DIAGNOSIS — I10 Essential (primary) hypertension: Secondary | ICD-10-CM

## 2011-04-08 DIAGNOSIS — E785 Hyperlipidemia, unspecified: Secondary | ICD-10-CM

## 2011-04-08 NOTE — Assessment & Plan Note (Signed)
Continue statin tx. Obtain lipid/lft in October and then schedule 81month f/u with labs.

## 2011-04-08 NOTE — Assessment & Plan Note (Signed)
Normotensive and stable. Continue current regimen. Monitor bp as outpt and followup in clinic as scheduled.  

## 2011-04-08 NOTE — Progress Notes (Signed)
  Subjective:    Patient ID: [name] [name], female    DOB: Dec 15, 1940, 70 y.o.   MRN: [id number]  HPI Pt presents to clinic for followup of multiple medical problems. BP reviewed under normotensive control. Home bp's nl. Tolerates statin tx without myalgias or abn lft's. Reviewed 4/12 labs. Recent flare of collagenous colitis now improved. No other complaints.   Past Medical History  Diagnosis Date  . Chest pain   . Hypertension   . Hyperlipidemia   . Allergic rhinitis   . GERD (gastroesophageal reflux disease)   . Cancer of skin of leg   . History of anemia   . History of poliomyelitis     Polio  age 89- no significant neuromuscular deficit  . IBS (irritable bowel syndrome)   . Collagenous colitis    Past Surgical History  Procedure Date  . Appendectomy 1944  . Breast biopsy 1947 & 10/07    Benign  . Tonsillectomy 1994  . Skin cancer excision     removal of skin cancer on left leg  . Colonoscopy   . Colonoscopy     reports that she has quit smoking. She has never used smokeless tobacco. She reports that she drinks alcohol. She reports that she does not use illicit drugs. family history includes Alcohol abuse in her father; Colon cancer in her maternal grandmother; Heart disease in her father and paternal grandfather; Hypertension in her father and sister; Peripheral vascular disease in her mother; Stomach cancer in her maternal grandfather; and Stroke in her father. Allergies  Allergen Reactions  . Erythromycin     REACTION: Rash  . Sulfonamide Derivatives     REACTION: Hives     Review of Systems see hpi     Objective:   Physical Exam  Physical Exam  Nursing note and vitals reviewed. Constitutional: Appears well-developed and well-nourished. No distress.  HENT:  Head: Normocephalic and atraumatic.  Right Ear: External ear normal.  Left Ear: External ear normal.  Eyes: Conjunctivae are normal. No scleral icterus.  Neck: Neck supple. Carotid bruit is not present.    Cardiovascular: Normal rate, regular rhythm and normal heart sounds.  Exam reveals no gallop and no friction rub.   No murmur heard. Pulmonary/Chest: Effort normal and breath sounds normal. No respiratory distress. He has no wheezes. no rales.  Lymphadenopathy:    He has no cervical adenopathy.  Neurological:Alert.  Skin: Skin is warm and dry. Not diaphoretic.  Psychiatric: Has a normal mood and affect.        Assessment & Plan:

## 2011-05-06 ENCOUNTER — Telehealth: Payer: Self-pay | Admitting: Internal Medicine

## 2011-05-06 NOTE — Telephone Encounter (Signed)
Patient is correct. Her patient instructions from her appointment with Mike Gip, PA-C state that she needs to be here on 05/10/11 @ 3:30 pm for her appointment. However, she was scheduled in the 8:30 appointment slot in error apparently. I have moved patient to 3:45 pm on 05/10/11 and have spoken to the patient. She verbalizes understanding.

## 2011-05-10 ENCOUNTER — Ambulatory Visit: Payer: Medicare Other | Admitting: Internal Medicine

## 2011-05-10 ENCOUNTER — Encounter: Payer: Self-pay | Admitting: Internal Medicine

## 2011-05-10 ENCOUNTER — Ambulatory Visit (INDEPENDENT_AMBULATORY_CARE_PROVIDER_SITE_OTHER): Payer: Medicare Other | Admitting: Internal Medicine

## 2011-05-10 VITALS — BP 112/80 | HR 64 | Ht 66.0 in | Wt 172.0 lb

## 2011-05-10 DIAGNOSIS — K5289 Other specified noninfective gastroenteritis and colitis: Secondary | ICD-10-CM

## 2011-05-10 NOTE — Progress Notes (Signed)
[name] [name] 1941-04-16 MRN [id number]        History of Present Illness:  This is a 70 year old white female with collagenous colitis. She was started on Entocort 9 mg daily 6 weeks ago for exacerbation  and has had complete resolution of diarrhea. Her severe diarrhea has subsided to where  she has 2 formed stools a day .Collagenous colitis was diagnosed in 1988 and again in 2001. Prior  colonoscopy in April 2006. The last colonoscopy in June 2011 showed  hyperplastic polyp. She was on large doses of ibuprofen after her a course of treatment when her diarrhea started. She has not taken any NSAID's recently   Past Medical History  Diagnosis Date  . Chest pain   . Hypertension   . Hyperlipidemia   . Allergic rhinitis   . GERD (gastroesophageal reflux disease)   . Cancer of skin of leg   . History of anemia   . History of poliomyelitis     Polio  age 68- no significant neuromuscular deficit  . IBS (irritable bowel syndrome)   . Collagenous colitis   . Hyperplastic colon polyp 01/14/10   Past Surgical History  Procedure Date  . Appendectomy 1944  . Breast biopsy 1947 & 10/07    Benign  . Tonsillectomy 1994  . Skin cancer excision     removal of skin cancer on left leg    reports that she has quit smoking. She has never used smokeless tobacco. She reports that she drinks alcohol. She reports that she does not use illicit drugs. family history includes Alcohol abuse in her father; Colon cancer in her maternal grandmother; Heart disease in her father and paternal grandfather; Hypertension in her father and sister; Peripheral vascular disease in her mother; Stomach cancer in her maternal grandfather; and Stroke in her father. Allergies  Allergen Reactions  . Erythromycin     REACTION: Rash  . Sulfonamide Derivatives     REACTION: Hives        Review of Systems:  The remainder of the 10 point ROS is negative except as outlined in H&P   Physical Exam: General appearance   Well developed, in no distress. Eyes- non icteric. HEENT nontraumatic, normocephalic. Mouth no lesions, tongue papillated, no cheilosis. Neck supple without adenopathy, thyroid not enlarged, no carotid bruits, no JVD. Lungs Clear to auscultation bilaterally. Cor normal S1, normal S2, regular rhythm, no murmur,  quiet precordium. Abdomen: Soft nontender with normal active bowel sounds Rectal: Deferred Extremities no pedal edema. Skin no lesions. Neurological alert and oriented x 3. Psychological normal mood and affect.  Assessment and Plan:  Collagenous colitis currently in remission on 9 mg of Entocort. She will start to taper to 6 mg a day for 2 weeks and subsequently to 30 mg a day for 2 weeks. Then discontinue. Consider alternative treatment with mesalamine or Cipro which in the past helps her diarrhea   05/10/2011 Lina Sar

## 2011-05-10 NOTE — Patient Instructions (Signed)
CC: Dr Artist Pais

## 2011-05-21 ENCOUNTER — Telehealth: Payer: Self-pay | Admitting: Internal Medicine

## 2011-05-21 MED ORDER — OMEPRAZOLE 20 MG PO CPDR: 20.0000 mg | DELAYED_RELEASE_CAPSULE | Freq: Every day | ORAL | Status: DC

## 2011-05-21 NOTE — Telephone Encounter (Signed)
Refill- omeprazole 20mg  otc tablet. Take 1 tablet by mouth every day. Qty 42. Last fill 7.6.12

## 2011-05-21 NOTE — Telephone Encounter (Signed)
Rx refill sent to pharmacy. 

## 2011-05-24 ENCOUNTER — Telehealth: Payer: Self-pay | Admitting: Internal Medicine

## 2011-05-24 MED ORDER — CHLORTHALIDONE 25 MG PO TABS: 25.0000 mg | ORAL_TABLET | Freq: Every day | ORAL | Status: DC

## 2011-05-24 NOTE — Telephone Encounter (Signed)
Refill-chlorthalidone 25mg  tab. Take one tablet by mouth daily. Qty 30. Last fill 9.5.12

## 2011-05-24 NOTE — Telephone Encounter (Signed)
Rx refill sent to pharmacy. 

## 2011-06-10 ENCOUNTER — Telehealth: Payer: Self-pay | Admitting: Internal Medicine

## 2011-06-10 DIAGNOSIS — I4891 Unspecified atrial fibrillation: Secondary | ICD-10-CM

## 2011-06-10 DIAGNOSIS — Z79899 Other long term (current) drug therapy: Secondary | ICD-10-CM

## 2011-06-10 DIAGNOSIS — E876 Hypokalemia: Secondary | ICD-10-CM

## 2011-06-10 HISTORY — DX: Unspecified atrial fibrillation: I48.91

## 2011-06-10 NOTE — Telephone Encounter (Signed)
Patient adm to hospital in [place] for Atrial Fib,  Doctor at hospital told her she will need to have her potassium and magnesium level checked she is going to [facility]   Nov 9  Please add these to her lab order

## 2011-06-11 NOTE — Telephone Encounter (Signed)
Call returned to patient at [phone], no answer. A detailed voice message was left for patient informing her office visit is needed for hospital follow up in order to determine why the lab requested are needed. Message was left for patient to call back to schedule office visit for hospital follow up.

## 2011-06-21 ENCOUNTER — Encounter: Payer: Self-pay | Admitting: Vascular Surgery

## 2011-06-21 ENCOUNTER — Ambulatory Visit: Payer: Medicare Other | Admitting: Vascular Surgery

## 2011-06-23 ENCOUNTER — Ambulatory Visit: Payer: Medicare Other | Admitting: Vascular Surgery

## 2011-06-25 ENCOUNTER — Other Ambulatory Visit (INDEPENDENT_AMBULATORY_CARE_PROVIDER_SITE_OTHER): Payer: Medicare Other

## 2011-06-25 DIAGNOSIS — Z79899 Other long term (current) drug therapy: Secondary | ICD-10-CM

## 2011-06-25 DIAGNOSIS — E876 Hypokalemia: Secondary | ICD-10-CM

## 2011-06-25 DIAGNOSIS — E785 Hyperlipidemia, unspecified: Secondary | ICD-10-CM

## 2011-06-25 LAB — BASIC METABOLIC PANEL
BUN: 18 mg/dL (ref 6–23)
Calcium: 9.3 mg/dL (ref 8.4–10.5)
GFR: 99.1 mL/min (ref >60.00)
Glucose, Bld: 91 mg/dL (ref 70–99)

## 2011-06-25 LAB — HEPATIC FUNCTION PANEL
ALT: 25 U/L (ref 0–35)
Alkaline Phosphatase: 53 U/L (ref 39–117)
Bilirubin, Direct: 0.1 mg/dL (ref 0.0–0.3)
Total Bilirubin: 0.7 mg/dL (ref 0.3–1.2)
Total Protein: 6.8 g/dL (ref 6.0–8.3)

## 2011-06-25 LAB — LIPID PANEL
Cholesterol: 162 mg/dL (ref 0–200)
VLDL: 12.2 mg/dL (ref 0.0–40.0)

## 2011-06-25 LAB — MAGNESIUM: Magnesium: 1.8 mg/dL (ref 1.5–2.5)

## 2011-06-28 ENCOUNTER — Encounter: Payer: Self-pay | Admitting: Vascular Surgery

## 2011-06-29 ENCOUNTER — Encounter: Payer: Self-pay | Admitting: Vascular Surgery

## 2011-06-29 ENCOUNTER — Ambulatory Visit (INDEPENDENT_AMBULATORY_CARE_PROVIDER_SITE_OTHER): Payer: Medicare Other | Admitting: Internal Medicine

## 2011-06-29 ENCOUNTER — Ambulatory Visit (INDEPENDENT_AMBULATORY_CARE_PROVIDER_SITE_OTHER): Payer: Medicare Other | Admitting: Vascular Surgery

## 2011-06-29 ENCOUNTER — Encounter: Payer: Self-pay | Admitting: Internal Medicine

## 2011-06-29 VITALS — BP 132/82 | HR 67 | Temp 98.0°F | Resp 18 | Wt 175.0 lb

## 2011-06-29 VITALS — BP 169/81 | HR 69 | Resp 18 | Ht 66.0 in | Wt 175.0 lb

## 2011-06-29 DIAGNOSIS — I4891 Unspecified atrial fibrillation: Secondary | ICD-10-CM

## 2011-06-29 DIAGNOSIS — I83893 Varicose veins of bilateral lower extremities with other complications: Secondary | ICD-10-CM

## 2011-06-29 DIAGNOSIS — Z23 Encounter for immunization: Secondary | ICD-10-CM

## 2011-06-29 DIAGNOSIS — I4821 Permanent atrial fibrillation: Secondary | ICD-10-CM | POA: Insufficient documentation

## 2011-06-29 DIAGNOSIS — E876 Hypokalemia: Secondary | ICD-10-CM

## 2011-06-29 LAB — TSH: TSH: 0.901 u[IU]/mL (ref 0.350–4.500)

## 2011-06-29 LAB — BASIC METABOLIC PANEL
BUN: 24 mg/dL — ABNORMAL HIGH (ref 6–23)
Creat: 0.79 mg/dL (ref 0.50–1.10)
Glucose, Bld: 105 mg/dL — ABNORMAL HIGH (ref 70–99)
Potassium: 4 mEq/L (ref 3.5–5.3)

## 2011-06-29 LAB — MAGNESIUM: Magnesium: 2 mg/dL (ref 1.5–2.5)

## 2011-06-29 NOTE — Progress Notes (Signed)
Problems with Activities of Daily Living Secondary to Leg Pain  1.Mrs. Haser states any activities that require prolonged standing (cooking, cleaning, shopping) are very difficult due to leg pain.  2. Mrs. Guercio states that walking for exercise is very difficult due to leg pain.    Rankin, Neena Rhymes   Failure of  Conservative Therapy:  1. Worn 20-30 mm Hg thigh high compression hose >3 months with no relief of symptoms.  2. Frequently elevates legs-no relief of symptoms  3. Taken Ibuprofen 600 Mg TID with no relief of symptoms.  The patient has today for followup of her bilateral venous hypertension. She has undergone successful staged bilateral laser ablation of her great saphenous vein. She has had some improvement but does report continued discomfort over the tributary varicosities bilaterally with prolonged standing. This is despite the use of compression garments.  Medical history is significant for onset of atrial fib requiring hospitalization out-of-town one week ago. This was densely related to potassium deficiency.  Physical exam.continued enlarged tributary varicosities her knees distally bilaterally. Palpable dorsalis pedis pulses bilaterally. Grossly intact neurologically.   Impression and plan: Painful tributary varicosities status post bilateral staged laser ablation of great saphenous vein. I've recommended outpatient stab phlebectomy of her tributary varicosities. He wishes to proceed and understands this is an outpatient under local anesthetic.

## 2011-06-29 NOTE — Assessment & Plan Note (Signed)
Obtain chem7 and mg. Currently off diuretic

## 2011-06-29 NOTE — Assessment & Plan Note (Signed)
EKG obtained demonstrates NSR 67 with nl intervals and axis. Obtain TSH. Records release. Cardiology consult. Anticipate possible cardiac monitor. Continue current calcium channel blocker.

## 2011-06-29 NOTE — Progress Notes (Signed)
  Subjective:    Patient ID: [name] [name], female    DOB: 03-19-1941, 70 y.o.   MRN: [id number]  HPI Pt presents to clinic for hospital follow up of atrial fibrillation. No known past h/o afib. Recently while in [place], [place] developed palpitations worse at night and ultimately associated with tachycardia and dizziness. Evaluated at local hospital and admitted with afib RVR. Tx'ed with ?cardizem gtt with ultimately spontaneous conversion to nsr. S/p echo ?nl. Was told hypokalemic with k 3.2 and low mg. hctz stopped and has noted 7lb wt gain and LE leg swelling since dc. norvasc was changed to diltiazem. Denies cp, dyspnea, palpitations. No other complaints.  Past Medical History  Diagnosis Date  . Chest pain   . Hypertension   . Hyperlipidemia   . Allergic rhinitis   . GERD (gastroesophageal reflux disease)   . Cancer of skin of leg   . History of anemia   . History of poliomyelitis     Polio  age 70- no significant neuromuscular deficit  . IBS (irritable bowel syndrome)   . Collagenous colitis   . Hyperplastic colon polyp 01/14/10  . Atrial fibrillation 06-10-2011  . Potassium deficiency    Past Surgical History  Procedure Date  . Appendectomy 1944  . Breast biopsy 1947 & 10/07    Benign  . Tonsillectomy 1994  . Skin cancer excision     removal of skin cancer on left leg  . Endovenous ablation saphenous vein w/ laser 03-03-2011 left greater saphenous vein   . Endovenous ablation saphenous vein w/ laser 01-06-2011  right greater saphenous vein    reports that she quit smoking about 23 years ago. Her smoking use included Cigarettes. She has never used smokeless tobacco. She reports that she drinks about 7.2 ounces of alcohol per week. She reports that she does not use illicit drugs. family history includes Alcohol abuse in her father; Colon cancer in her maternal grandmother; Heart disease in her father and paternal grandfather; Hypertension in her father and sister; Other in her mother and  sister; Peripheral vascular disease in her mother; Stomach cancer in her maternal grandfather; and Stroke in her father. Allergies  Allergen Reactions  . Erythromycin     REACTION: Rash  . Sulfonamide Derivatives     REACTION: Hives       Review of Systems see hpi     Objective:   Physical Exam  Nursing note and vitals reviewed. Constitutional: She appears well-developed and well-nourished. No distress.  HENT:  Head: Normocephalic and atraumatic.  Right Ear: External ear normal.  Left Ear: External ear normal.  Eyes: Conjunctivae are normal. No scleral icterus.  Neck: Neck supple. No JVD present.  Cardiovascular: Normal rate, regular rhythm and normal heart sounds.  Exam reveals no gallop and no friction rub.   No murmur heard. Pulmonary/Chest: Effort normal and breath sounds normal. No respiratory distress. She has no wheezes. She has no rales.  Musculoskeletal: She exhibits edema.       +1 bilateral le edema  Neurological: She is alert.  Skin: Skin is warm and dry. She is not diaphoretic.  Psychiatric: She has a normal mood and affect.          Assessment & Plan:

## 2011-07-01 ENCOUNTER — Ambulatory Visit (INDEPENDENT_AMBULATORY_CARE_PROVIDER_SITE_OTHER): Payer: Medicare Other | Admitting: Physician Assistant

## 2011-07-01 ENCOUNTER — Encounter: Payer: Self-pay | Admitting: Physician Assistant

## 2011-07-01 DIAGNOSIS — I4891 Unspecified atrial fibrillation: Secondary | ICD-10-CM

## 2011-07-01 DIAGNOSIS — I10 Essential (primary) hypertension: Secondary | ICD-10-CM

## 2011-07-01 DIAGNOSIS — E785 Hyperlipidemia, unspecified: Secondary | ICD-10-CM

## 2011-07-01 NOTE — Assessment & Plan Note (Signed)
Managed by PCP

## 2011-07-01 NOTE — Patient Instructions (Signed)
Your physician recommends that you schedule a follow-up appointment in: 1 MONTH WITH DR. CRENSHAW IN THE [place] OFFICE, PLEASE CANCEL [place] Appt  Your physician has requested that you have en exercise stress myoview DX 401.1 HTN. For further information please visit [email or web address] Please follow instruction sheet, as given.  Your physician has recommended that you wear an event monitor DX 427.31 AFIB. Event monitors are medical devices that record the heart's electrical activity. Doctors most often [place] these monitors to diagnose arrhythmias. Arrhythmias are problems with the speed or rhythm of the heartbeat. The monitor is a small, portable device. You can wear one while you do your normal daily activities. This is usually used to diagnose what is causing palpitations/syncope (passing out).

## 2011-07-01 NOTE — Progress Notes (Signed)
History of Present Illness: PCP:  Dr. Rodena Medin Primary Cardiologist:  Dr. [name]   [name] [name] is a 70 y.o. female presents for evaluation of AFib.  She was evaluated by Dr. [name] in 03/2009 for chest pain.  ETT-echo was done that was normal at submaximal exercise.  No further workup was pursued.  Of note, EF was normal.  Other hx includes HTN, HLP, GERD, anemia and collagenous colitis.    She was vacationing in [place] recently and developed rapid palpitations.  She clocked her HR at 140.  She felt fatigued and dizzy.  She went to [facility].  She was observed overnight and diagnosed with AFib with RVR.  She states she was put on IV diltiazem and eventually converted to NSR on her own.  She was not put on coumadin.  She was kept on Diltiazem.  She had and echo and was under the impression that it was normal.  Her PCP requested records when he saw her yesterday.  She was told her K+ was 3.1 and this was replaced.  Magnesium was also replaced.  Chlorthalidone was stopped.  Since coming home, she notes occasional palpitations that are fairly brief.  She may note them for 10-15 mins sometimes.  She denies symptoms like what she had with  AFib.  Denies chest pain, dyspnea, syncope.  No exercise intolerance.  No orthopnea, PND.  She has chronic LE edema from venous insufficiency.  She has had vein sclerotherapy in the past.    Labs 06/30/11: K 4.1, creatinine 0.6, TSH 0.901  Past Medical History  Diagnosis Date  . Chest pain   . Hypertension   . Hyperlipidemia   . Allergic rhinitis   . GERD (gastroesophageal reflux disease)   . Cancer of skin of leg   . History of anemia   . History of poliomyelitis     Polio  age 52- no significant neuromuscular deficit  . IBS (irritable bowel syndrome)   . Collagenous colitis   . Hyperplastic colon polyp 01/14/10  . Atrial fibrillation 06-10-2011  . Potassium deficiency     Current Outpatient Prescriptions  Medication Sig Dispense  Refill  . aspirin 81 MG tablet Take 81 mg by mouth daily.        . bisoprolol (ZEBETA) 5 MG tablet 1/2 tablet once daily  30 tablet  3  . desonide (DESOWEN) 0.05 % cream Apply topically 2 (two) times daily.        Marland Kitchen diltiazem (CARDIZEM CD) 120 MG 24 hr capsule Take 120 mg by mouth daily.        . diphenhydrAMINE (BENADRYL) 25 MG tablet Take 25 mg by mouth at bedtime as needed.        Marland Kitchen losartan (COZAAR) 25 MG tablet Take 1 tablet (25 mg total) by mouth daily.  30 tablet  3  . Multiple Vitamin (MULTIVITAMINS PO) Take by mouth daily.        Marland Kitchen omeprazole (PRILOSEC) 20 MG capsule Take 1 capsule (20 mg total) by mouth daily.  30 capsule  3  . potassium chloride (KLOR-CON) 10 MEQ CR tablet Take 10 mEq by mouth daily.        . pravastatin (PRAVACHOL) 40 MG tablet Take 1 tablet (40 mg total) by mouth at bedtime.  30 tablet  3  . Probiotic Product (PROBIOTIC FORMULA PO) Take by mouth daily.          Allergies: Allergies  Allergen Reactions  . Erythromycin  REACTION: Rash  . Sulfonamide Derivatives     REACTION: Hives    History  Substance Use Topics  . Smoking status: Former Smoker    Types: Cigarettes    Quit date: 08/17/1987  . Smokeless tobacco: Never Used   Comment: quit in 1979  . Alcohol Use: 7.2 oz/week    12 Glasses of wine per week     glass of wine daily     ROS:  Please see the history of present illness.   All other systems reviewed and negative.   Vital Signs: BP 140/73  Pulse 66  Resp 18  Ht 5\' 3"  (1.6 m)  Wt 173 lb 1.9 oz (78.527 kg)  BMI 30.67 kg/m2  PHYSICAL EXAM: Well nourished, well developed, in no acute distress HEENT: normal Neck: no JVD Cardiac:  normal S1, S2; RRR; no murmur Lungs:  clear to auscultation bilaterally, no wheezing, rhonchi or rales Abd: soft, nontender, no hepatomegaly Ext: no edema Skin: warm and dry Neuro:  CNs 2-12 intact, no focal abnormalities noted Psych: normal affect  EKG:   NSR, HR 64, normal axis, no ischemic  changes.  ASSESSMENT AND PLAN:

## 2011-07-01 NOTE — Assessment & Plan Note (Addendum)
She continues to have palpitations.  Question if she continues to have paroxysms of AFib.  Will arrange event monitor.  Will request echo records from Dr. Rodena Medin when he receives.  Will arrange ETT-myoview to rule out ischemic heart disease in the event she may require anti-arrhythmic therapy.  CHADS2 score is 1.  Continue ASA for now.  CHADS2-VASc score is 3 with HTN, age over 50 and female gender.  If event monitor demonstrates recurrent AFib will need to decide +/- anticoagulant therapy.   Follow up with Dr. [name] in one month.

## 2011-07-01 NOTE — Assessment & Plan Note (Signed)
Borderline control. Continue to monitor. 

## 2011-07-02 ENCOUNTER — Other Ambulatory Visit: Payer: Self-pay | Admitting: *Deleted

## 2011-07-02 ENCOUNTER — Encounter (INDEPENDENT_AMBULATORY_CARE_PROVIDER_SITE_OTHER): Payer: Medicare Other

## 2011-07-02 DIAGNOSIS — I4891 Unspecified atrial fibrillation: Secondary | ICD-10-CM

## 2011-07-02 MED ORDER — DILTIAZEM HCL ER COATED BEADS 120 MG PO CP24: 120.0000 mg | ORAL_CAPSULE | Freq: Every day | ORAL | Status: DC

## 2011-07-02 MED ORDER — POTASSIUM CHLORIDE 10 MEQ PO TBCR: 10.0000 meq | EXTENDED_RELEASE_TABLET | Freq: Every day | ORAL | Status: DC

## 2011-07-02 NOTE — Telephone Encounter (Signed)
rx sent in for diltiazem and K+ today per pt request. Danielle Rankin

## 2011-07-06 ENCOUNTER — Telehealth: Payer: Self-pay | Admitting: *Deleted

## 2011-07-06 NOTE — Progress Notes (Signed)
Pt notified and copy has been mailed to her home.

## 2011-07-06 NOTE — Telephone Encounter (Signed)
Received call from pt wanting to know when she should follow up with Dr Rodena Medin? Please advise.

## 2011-07-14 ENCOUNTER — Ambulatory Visit (HOSPITAL_COMMUNITY): Payer: Medicare Other | Attending: Internal Medicine | Admitting: Radiology

## 2011-07-14 DIAGNOSIS — R0602 Shortness of breath: Secondary | ICD-10-CM

## 2011-07-14 DIAGNOSIS — R55 Syncope and collapse: Secondary | ICD-10-CM | POA: Insufficient documentation

## 2011-07-14 DIAGNOSIS — Z8249 Family history of ischemic heart disease and other diseases of the circulatory system: Secondary | ICD-10-CM | POA: Insufficient documentation

## 2011-07-14 DIAGNOSIS — I4891 Unspecified atrial fibrillation: Secondary | ICD-10-CM | POA: Insufficient documentation

## 2011-07-14 DIAGNOSIS — R002 Palpitations: Secondary | ICD-10-CM | POA: Insufficient documentation

## 2011-07-14 DIAGNOSIS — E785 Hyperlipidemia, unspecified: Secondary | ICD-10-CM | POA: Insufficient documentation

## 2011-07-14 DIAGNOSIS — I10 Essential (primary) hypertension: Secondary | ICD-10-CM | POA: Insufficient documentation

## 2011-07-14 DIAGNOSIS — Z87891 Personal history of nicotine dependence: Secondary | ICD-10-CM | POA: Insufficient documentation

## 2011-07-14 DIAGNOSIS — R42 Dizziness and giddiness: Secondary | ICD-10-CM | POA: Insufficient documentation

## 2011-07-14 DIAGNOSIS — R000 Tachycardia, unspecified: Secondary | ICD-10-CM | POA: Insufficient documentation

## 2011-07-14 MED ORDER — TECHNETIUM TC 99M TETROFOSMIN IV KIT
33.0000 | PACK | Freq: Once | INTRAVENOUS | Status: AC | PRN
  Administered 2011-07-14: 33 via INTRAVENOUS

## 2011-07-14 MED ORDER — REGADENOSON 0.4 MG/5ML IV SOLN
0.4000 mg | Freq: Once | INTRAVENOUS | Status: AC
  Administered 2011-07-14: 0.4 mg via INTRAVENOUS

## 2011-07-14 MED ORDER — TECHNETIUM TC 99M TETROFOSMIN IV KIT
11.0000 | PACK | Freq: Once | INTRAVENOUS | Status: AC | PRN
  Administered 2011-07-14: 11 via INTRAVENOUS

## 2011-07-14 NOTE — Telephone Encounter (Signed)
Initially missed this. If no appt pending then 3 months. Currently seeing cardiology

## 2011-07-14 NOTE — Progress Notes (Signed)
[facility] SITE 3 NUCLEAR MED [address] [place] [address] [phone]  Cardiology Nuclear Med Study  [name] [name] is a 70 y.o. female [id number] 1941/08/02   Nuclear Med Background Indication for Stress Test:  Evaluation for Ischemia and 06/09/11 Post Hospital: AFIB with RVR [facility], [place]) History:  No previous documented CAD, '10 Stress Echo: NL, '12 Echo: NL [facility]) and Recent AFIB w/RVR Cardiac Risk Factors:Strong Family History - CAD, History of Smoking, Hypertension and Lipids  Symptoms:  Dizziness, Light-Headedness, Near Syncope, Palpitations and Rapid HR   Nuclear Pre-Procedure Caffeine/Decaff Intake:  None NPO After: 9:00pm   Lungs:  Clear IV 0.9% NS with Angio Cath:  22g  IV Site: R Hand x 1, tolerated well IV Started by:  Doyne Keel, CNMT  Chest Size (in):  40 Cup Size: C  Height: 5\' 3"  (1.6 m)  Weight:  174 lb (78.926 kg)  BMI:  Body mass index is 30.82 kg/(m^2). Tech Comments:  Held bisoprolol x 48 hrs    Nuclear Med Study 1 or 2 day study: 1 day  Stress Test Type:  Stress  Reading MD: Arvilla Meres, MD  Order Authorizing Provider:  [name], MD, Tereso Newcomer, [facility]  Resting Radionuclide: Technetium 47m Tetrofosmin  Resting Radionuclide Dose: 11.0 mCi   Stress Radionuclide:  Technetium 75m Tetrofosmin  Stress Radionuclide Dose: 33.0 mCi           Stress Protocol Rest HR: 68 Stress HR: 118  Rest BP: 150/80 Stress BP: 211/71  Exercise Time (min): 7:31 METS: 6.7   Predicted Max HR: 150 bpm % Max HR: 78.67 bpm Rate Pressure Product: 41324   Dose of Adenosine (mg):  n/a Dose of Lexiscan: 0.4 mg  Dose of Atropine (mg): n/a Dose of Dobutamine: n/a mcg/kg/min (at max HR)  Stress Test Technologist: Irean Hong, RN  Nuclear Technologist:  Domenic Polite, CNMT     Rest Procedure:  Myocardial perfusion imaging was performed at rest 45 minutes following the intravenous administration of  Technetium 48m Tetrofosmin. Rest ECG: NSR  Stress Procedure:  The patient attempted to walk the treadmill utilizing the Bruce Protocol for 5 minutes, but was unable to reach the target heart rate due to severe (R)knee pain, and DOE. The patient received IV Lexiscan 0.4 mg over 15-seconds with concurrent low level exercise.  Technetium 55m Tetrofosmin was injected at 30-seconds while the patient continued walking. There were no significant changes with Lexiscan. There was a hypertensive response to exercise. Quantitative spect images were obtained after a 45-minute delay. Stress ECG: No significant change from baseline ECG  QPS Raw Data Images:  Normal; no motion artifact; normal heart/lung ratio. Stress Images:  Normal homogeneous uptake in all areas of the myocardium. Rest Images:  Normal homogeneous uptake in all areas of the myocardium. Subtraction (SDS):  Normal Transient Ischemic Dilatation (Normal <1.22):  1.03 Lung/Heart Ratio (Normal <0.45):  0.36  Quantitative Gated Spect Images QGS EDV:  88 ml QGS ESV:  27 ml QGS cine images:  NL LV Function; NL Wall Motion QGS EF: 69%  Impression Exercise Capacity:  Lexiscan with low level exercise. BP Response:  Hypertensive blood pressure response. Clinical Symptoms:  There is dyspnea. ECG Impression:  No significant ST segment change suggestive of ischemia. Comparison with Prior Nuclear Study: No previous nuclear study performed  Overall Impression:  Normal stress nuclear study.      Reuel Boom BensimhonMD

## 2011-07-15 NOTE — Telephone Encounter (Signed)
Call placed to patient at [phone], she was informed per Dr Rodena Medin instructions. She stated she will call back to make an appointment for February.

## 2011-07-21 ENCOUNTER — Ambulatory Visit: Payer: Medicare Other | Admitting: Cardiology

## 2011-07-26 ENCOUNTER — Telehealth: Payer: Self-pay | Admitting: Internal Medicine

## 2011-07-26 MED ORDER — LOSARTAN POTASSIUM 25 MG PO TABS: 25.0000 mg | ORAL_TABLET | Freq: Every day | ORAL | Status: DC

## 2011-07-26 NOTE — Telephone Encounter (Signed)
Rx refill sent to pharmacy. 

## 2011-07-26 NOTE — Telephone Encounter (Signed)
Refill-losartan potassium 25mg . Take one tablet daily. Qty 30 last fill 11.3.12

## 2011-08-04 ENCOUNTER — Ambulatory Visit: Payer: Medicare Other | Admitting: Cardiology

## 2011-08-06 ENCOUNTER — Ambulatory Visit (INDEPENDENT_AMBULATORY_CARE_PROVIDER_SITE_OTHER): Payer: Medicare Other | Admitting: Cardiology

## 2011-08-06 ENCOUNTER — Encounter: Payer: Self-pay | Admitting: Cardiology

## 2011-08-06 VITALS — BP 150/69 | HR 61 | Ht 66.0 in | Wt 171.0 lb

## 2011-08-06 DIAGNOSIS — I10 Essential (primary) hypertension: Secondary | ICD-10-CM

## 2011-08-06 DIAGNOSIS — E785 Hyperlipidemia, unspecified: Secondary | ICD-10-CM

## 2011-08-06 DIAGNOSIS — Z79899 Other long term (current) drug therapy: Secondary | ICD-10-CM

## 2011-08-06 DIAGNOSIS — I4891 Unspecified atrial fibrillation: Secondary | ICD-10-CM

## 2011-08-06 MED ORDER — DABIGATRAN ETEXILATE MESYLATE 150 MG PO CAPS: 150.0000 mg | ORAL_CAPSULE | Freq: Two times a day (BID) | ORAL | Status: DC

## 2011-08-06 MED ORDER — CHLORTHALIDONE 25 MG PO TABS: 25.0000 mg | ORAL_TABLET | Freq: Every day | ORAL | Status: DC

## 2011-08-06 NOTE — Assessment & Plan Note (Signed)
Blood pressure mildly elevated and she is complaining of worsening edema after discontinuing her diuretic. Resume chlorthalidone 25 mg daily. Note her potassium was recently normal on supplement. Repeat potassium and renal function in one week.

## 2011-08-06 NOTE — Assessment & Plan Note (Signed)
Patient with paroxysmal atrial fibrillation. Obtain records from [place] concerning recent hospitalization an echocardiogram. Continue present medications for rate control. She has embolic risk factors of age greater than 19, female sex and hypertension. I have recommended pradaxa 150 mg by mouth twice a day. She is having a vein procedure next week. She will discontinue her aspirin following that procedure and begin pradaxa at that time. She may require an antiarrhythmic in the future if atrial fibrillation events continued.

## 2011-08-06 NOTE — Assessment & Plan Note (Signed)
Management per primary care. 

## 2011-08-06 NOTE — Progress Notes (Signed)
HPI: Pleasant female for fu of AFib. She was evaluated in 03/2009 for chest pain. ETT-echo was done that was normal at submaximal exercise.  Seen in the office by Tereso Newcomer in Nov 2012. Had recently had episode of atrial fib in [place]; treated with cardizem and converted spontaneously. Labs 06/30/11: K 4.1, creatinine 0.6, TSH 0.901. Had [facility] Nov 2012 that showed EF 69 and normal perfusion. Patient had monitor for recurrent palpitations. This showed sinus with pacs. She continues to have occasional brief palpitations but no sustained arrhythmias. She denies dyspnea on exertion, orthopnea, PND, syncope or chest pain. She has noticed increased pedal edema as her diuretic was discontinued.   Current Outpatient Prescriptions  Medication Sig Dispense Refill  . aspirin 81 MG tablet Take 81 mg by mouth daily.        . bisoprolol (ZEBETA) 5 MG tablet 1/2 tablet once daily  30 tablet  3  . desonide (DESOWEN) 0.05 % cream Apply topically 2 (two) times daily.        Marland Kitchen diltiazem (CARDIZEM CD) 120 MG 24 hr capsule Take 1 capsule (120 mg total) by mouth daily.  30 capsule  11  . diphenhydrAMINE (BENADRYL) 25 MG tablet Take 25 mg by mouth at bedtime as needed.        Marland Kitchen losartan (COZAAR) 25 MG tablet Take 1 tablet (25 mg total) by mouth daily.  30 tablet  3  . Multiple Vitamin (MULTIVITAMINS PO) Take by mouth daily.        Marland Kitchen omeprazole (PRILOSEC) 20 MG capsule Take 1 capsule (20 mg total) by mouth daily.  30 capsule  3  . potassium chloride (KLOR-CON) 10 MEQ CR tablet Take 1 tablet (10 mEq total) by mouth daily.  30 tablet  11  . pravastatin (PRAVACHOL) 40 MG tablet Take 1 tablet (40 mg total) by mouth at bedtime.  30 tablet  3  . Probiotic Product (PROBIOTIC FORMULA PO) Take by mouth daily.        . chlorthalidone (HYGROTON) 25 MG tablet Take 1 tablet (25 mg total) by mouth daily.  30 tablet  11  . dabigatran (PRADAXA) 150 MG CAPS Take 1 capsule (150 mg total) by mouth every 12 (twelve) hours.  60 capsule   11     Past Medical History  Diagnosis Date  . Hypertension   . Hyperlipidemia   . Allergic rhinitis   . GERD (gastroesophageal reflux disease)   . Cancer of skin of leg   . History of anemia   . History of poliomyelitis     Polio  age 37- no significant neuromuscular deficit  . IBS (irritable bowel syndrome)   . Collagenous colitis   . Hyperplastic colon polyp 01/14/10  . Atrial fibrillation 06-10-2011  . Potassium deficiency     Past Surgical History  Procedure Date  . Appendectomy 1944  . Breast biopsy 1947 & 10/07    Benign  . Tonsillectomy 1994  . Skin cancer excision     removal of skin cancer on left leg  . Endovenous ablation saphenous vein w/ laser 03-03-2011 left greater saphenous vein   . Endovenous ablation saphenous vein w/ laser 01-06-2011  right greater saphenous vein    History   Social History  . Marital Status: Married    Spouse Name: N/A    Number of Children: N/A  . Years of Education: N/A   Occupational History  . Not on file.   Social History Main Topics  . Smoking  status: Former Smoker    Types: Cigarettes    Quit date: 08/17/1987  . Smokeless tobacco: Never Used   Comment: quit in 1979  . Alcohol Use: 7.2 oz/week    12 Glasses of wine per week     glass of wine daily  . Drug Use: No  . Sexually Active: Not on file   Other Topics Concern  . Not on file   Social History Narrative   Retired Film/video editor 2 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Married  Former Smoker quit 1989 Alcohol use-yes -  glasses of wine/day   son works for [employer] - she flies to [place] to visit her grandchildren    ROS: no fevers or chills, productive cough, hemoptysis, dysphasia, odynophagia, melena, hematochezia, dysuria, hematuria, rash, seizure activity, orthopnea, PND, pedal edema, claudication. Remaining systems are negative.  Physical Exam: Well-developed well-nourished in no acute distress.  Skin is warm and dry.  HEENT is normal.  Neck is supple. No thyromegaly.  Chest  is clear to auscultation with normal expansion.  Cardiovascular exam is regular rate and rhythm.  Abdominal exam nontender or distended. No masses palpated. Extremities show trace ankle edema. neuro grossly intact

## 2011-08-06 NOTE — Patient Instructions (Signed)
Your physician recommends that you schedule a follow-up appointment in:  3 MONTHS WITH DR Jens Som Your physician has recommended you make the following change in your medication: NEXT WEEK START PRADAXA 150 MG 1 TAB TWICE DAILY  STOP ASPIRIN WHEN STARTING PRADAXA  AND ALSO START  CHLORTHALIDONE 25 MG 1 EVERY DAY Your physician recommends that you return for lab work in: 1 WEEK  BMET   DX  V58.69

## 2011-08-09 ENCOUNTER — Telehealth: Payer: Self-pay | Admitting: Cardiology

## 2011-08-09 NOTE — Telephone Encounter (Addendum)
ROI faxed to [facility] @ [phone] 08/09/11/km  Records received from [facility] gave to [facility] 08/09/11/km

## 2011-08-12 ENCOUNTER — Ambulatory Visit (INDEPENDENT_AMBULATORY_CARE_PROVIDER_SITE_OTHER): Payer: Medicare Other | Admitting: Vascular Surgery

## 2011-08-12 ENCOUNTER — Encounter: Payer: Self-pay | Admitting: Vascular Surgery

## 2011-08-12 ENCOUNTER — Other Ambulatory Visit (INDEPENDENT_AMBULATORY_CARE_PROVIDER_SITE_OTHER): Payer: Medicare Other | Admitting: *Deleted

## 2011-08-12 ENCOUNTER — Telehealth: Payer: Self-pay | Admitting: Internal Medicine

## 2011-08-12 VITALS — BP 139/64 | HR 82 | Resp 18 | Ht 66.0 in | Wt 185.0 lb

## 2011-08-12 DIAGNOSIS — Z79899 Other long term (current) drug therapy: Secondary | ICD-10-CM

## 2011-08-12 DIAGNOSIS — I83893 Varicose veins of bilateral lower extremities with other complications: Secondary | ICD-10-CM

## 2011-08-12 LAB — BASIC METABOLIC PANEL
BUN: 21 mg/dL (ref 6–23)
GFR: 108.99 mL/min (ref >60.00)
Potassium: 3.5 mEq/L (ref 3.5–5.1)

## 2011-08-12 NOTE — Progress Notes (Signed)
Laser Ablation Procedure      Date: 08/12/2011    [name] [name] DOB:1941-04-18  Consent signed: Yes  Surgeon:T.F. Aravind Chrismer    BP 139/64  Pulse 82  Resp 18  Ht 5\' 6"  (1.676 m)  Wt 185 lb (83.915 kg)  BMI 29.86 kg/m2  Start time: 11:00AM   End time: 12:15PM  Tumescent Anesthesia: 225 cc 0.9% NaCl with 50 cc Lidocaine HCL with 1% Epi and 15 cc 8.4% NaHCO3  Local Anesthesia: 3 cc Lidocaine HCL and NaHCO3 (ratio 2:1)       Stab Phlebectomy: 10-20 Sites: Thigh and Calf  Patient tolerated procedure well: Yes  Rankin, Neena Rhymes  Description of Procedure:  After marking the course of the saphenous vein and the secondary varicosities in the standing position, the patient was placed on the operating table in the supineposition, and the left leg was prepped and draped in sterile fashion. The patient was then put into Trendelenburg position.  Local anesthetic was utilized overlying the marked varicosities.  Greater than 10-20 stab wounds were made using the tip of an 11 blade; and using the vein hook,  The phlebectomies were performed using a hemostat to avulse these varicosities.  Adequate hemostasis was achieved, and steri strips were applied to the stab wound.      ABD pads and thigh high compression stockings were applied.  Ace wrap bandages were applied over the phlebectomy sites. Blood loss was less than 15 cc.  The patient ambulated out of the operating room having tolerated the procedure well.

## 2011-08-12 NOTE — Telephone Encounter (Signed)
Patient with a history of collagenous colitis.  She reports 9 days of severe diarrhea.  She has not been on entocort since October.  She is getting up during the night with watery stools 4 times last night.  She uses imodium which does improve her symptoms some.  Dr Juanda Chance please advise

## 2011-08-12 NOTE — Telephone Encounter (Signed)
Please start Entecort 3mg , 3 tabs po qd x 2 weeks, 2 tab po qd x 2 weeks then 1 po qd for at least 2-4 weeks depending on her symptoms., #60, 1 refill

## 2011-08-13 ENCOUNTER — Telehealth: Payer: Self-pay | Admitting: *Deleted

## 2011-08-13 ENCOUNTER — Encounter: Payer: Self-pay | Admitting: Vascular Surgery

## 2011-08-13 MED ORDER — BUDESONIDE 3 MG PO CP24: ORAL_CAPSULE | ORAL | Status: DC

## 2011-08-13 NOTE — Telephone Encounter (Signed)
08/13/2011  Time: 10:15 AM   Patient Name: [name] [name]  Patient of: T.F. Early  Procedure: stab phlebectomy left leg   Reached patient at home and checked  Her status  Yes    Comments/Actions Taken: Mrs. Hoston states she experienced mild aching pain around phlebectomy site left posterior calf last night that was relieved by Aleve.  Recommended that she use ice pack to area if she experiences pain again. Reminded her to wear compression dressing for full 48 hours and then compression hose only daytime for 2 weeks.  Reminded her of follow up visit with Dr. Arbie Cookey on 01-17 2013.      @SIGNATURE @

## 2011-08-13 NOTE — Telephone Encounter (Signed)
Spoke with patient and gave her Dr. Brodie's recommendations. Rx sent 

## 2011-08-26 ENCOUNTER — Telehealth: Payer: Self-pay | Admitting: Internal Medicine

## 2011-08-26 MED ORDER — BISOPROLOL FUMARATE 5 MG PO TABS: ORAL_TABLET | ORAL | Status: DC

## 2011-08-26 NOTE — Telephone Encounter (Signed)
Rx refill sent to pharmacy. 

## 2011-08-31 ENCOUNTER — Telehealth: Payer: Self-pay | Admitting: *Deleted

## 2011-08-31 DIAGNOSIS — E876 Hypokalemia: Secondary | ICD-10-CM

## 2011-08-31 MED ORDER — LORAZEPAM 1 MG PO TABS: ORAL_TABLET | ORAL | Status: DC

## 2011-08-31 NOTE — Telephone Encounter (Signed)
Recommend ativan 1mg  bid prn anxiety. Can take 30 mins before the flight. #4 no rf. In the family of valium without being so long lasting. May want to try at home once before the trip to see how strong the effect is. Cardiology last checked potassium and it was 3.5. Nl but low nl. If she wants it rechecked can place lab order for K

## 2011-08-31 NOTE — Telephone Encounter (Signed)
Call placed to patient at [phone], she was advised per Dr Rodena Medin instructions. She stated she would feel better if she had the potassium level checked. She stated the will have that drawn at the [place] office.   She was informed of Rx for flight , she stated she will need 2 tablets going an 2 tablets coming back,and if she was to try it before she left,she would need an additional tablet. She was informed 5 tablets would be called to her pharmacy.

## 2011-08-31 NOTE — Telephone Encounter (Signed)
Patient called and left voice message stating she will be going on a trip to [place] in one week to visit her son and his family. She states that she has taken a Rx for Valium in the past for another flight and would like to know if Dr Rodena Medin would provide a Rx for her for the departure flight and for when she returns. She stated the medication seem to help calm her nerves while flying.   She is also wanting to know if she needs to have her potassium rechecked. She stated she was concerned that it was low, and she does not want to end up with a problem while she is traveling.

## 2011-09-01 ENCOUNTER — Encounter: Payer: Self-pay | Admitting: Vascular Surgery

## 2011-09-02 ENCOUNTER — Ambulatory Visit (INDEPENDENT_AMBULATORY_CARE_PROVIDER_SITE_OTHER): Payer: Medicare Other | Admitting: Vascular Surgery

## 2011-09-02 ENCOUNTER — Encounter: Payer: Self-pay | Admitting: Internal Medicine

## 2011-09-02 ENCOUNTER — Encounter: Payer: Self-pay | Admitting: Vascular Surgery

## 2011-09-02 ENCOUNTER — Other Ambulatory Visit (INDEPENDENT_AMBULATORY_CARE_PROVIDER_SITE_OTHER): Payer: Medicare Other

## 2011-09-02 VITALS — BP 156/63 | HR 88 | Resp 18 | Ht 66.5 in | Wt 169.0 lb

## 2011-09-02 DIAGNOSIS — I83893 Varicose veins of bilateral lower extremities with other complications: Secondary | ICD-10-CM

## 2011-09-02 DIAGNOSIS — E876 Hypokalemia: Secondary | ICD-10-CM

## 2011-09-02 LAB — POTASSIUM: Potassium: 3.9 mEq/L (ref 3.5–5.1)

## 2011-09-02 NOTE — Progress Notes (Signed)
The patient presents today for followup of her stab phlebectomy of left leg tributary varicosities on 08/12/2011. She had minimal discomfort associated with this. She has been very compliant with her compression. Her stab incisions are healing quite nicely. She is to travel to [place] in the next 2 weeks and then will have right leg stab phlebectomy's plan for February 7

## 2011-09-17 ENCOUNTER — Telehealth: Payer: Self-pay | Admitting: Cardiology

## 2011-09-17 NOTE — Telephone Encounter (Signed)
New Problem   Patient would like to speak with nurse regarding a procedure she is having on Thursdday 09-23-11.  Has concerns about stopping medication.  She can be reached at hm#

## 2011-09-17 NOTE — Telephone Encounter (Signed)
Patient states she is scheduled to have a right leg stab phlebectomy on Thursday 09/23/11 early in AM. Pt has been taken Pradaxa 150 mg BID since 08/14/11 she would like to know if she can stop taken this medication for three days prior procedure. Pt would like to be call with information by Monday 09/20/11.

## 2011-09-20 NOTE — Telephone Encounter (Signed)
Ok to hold pradaxa for three days prior to procedure and resume 2 days after [name] [name]

## 2011-09-20 NOTE — Telephone Encounter (Signed)
F/U   Patient called to F/u about stopping medication for upcoming procedure, she can be reached at [phone]

## 2011-09-20 NOTE — Telephone Encounter (Signed)
Spoke with pt, aware of dr crenshaw's recommendations. 

## 2011-09-22 ENCOUNTER — Encounter: Payer: Self-pay | Admitting: Vascular Surgery

## 2011-09-23 ENCOUNTER — Ambulatory Visit (INDEPENDENT_AMBULATORY_CARE_PROVIDER_SITE_OTHER): Payer: Medicare Other | Admitting: Vascular Surgery

## 2011-09-23 ENCOUNTER — Encounter: Payer: Self-pay | Admitting: Vascular Surgery

## 2011-09-23 VITALS — BP 144/80 | HR 80 | Resp 18 | Ht 66.0 in | Wt 168.0 lb

## 2011-09-23 DIAGNOSIS — I83893 Varicose veins of bilateral lower extremities with other complications: Secondary | ICD-10-CM

## 2011-09-23 NOTE — Progress Notes (Deleted)
Laser Ablation Procedure      Date: 09/23/2011    [name] [name] DOB:05-25-1941  Consent signed: Yes  Surgeon:T.F. Early    BP 144/80  Pulse 80  Resp 18  Ht 5\' 6"  (1.676 m)  Wt 168 lb (76.204 kg)  BMI 27.12 kg/m2  Start time: 11:20AM   End time: 12:30PM  Tumescent Anesthesia: 300 cc 0.9% NaCl with 50 cc Lidocaine HCL with 1% Epi and 15 cc 8.4% NaHCO3  Local Anesthesia: 2 cc Lidocaine HCL and NaHCO3 (ratio 2:1)       Stab Phlebectomy: 10-20 Sites: Thigh, Calf and Ankle  Right leg  Patient tolerated procedure well: Yes  Rankin, Neena Rhymes  Description of Procedure:  After marking the course of the saphenous vein and the secondary varicosities in the standing position, the patient was placed on the operating table in the supine position, and the right leg was prepped and draped in sterile fashion. The patient was then put into Trendelenburg position.  Local anesthetic was utilized overlying the marked varicosities.  Greater than 10-20 stab wounds were made using the tip of an 11 blade; and using the vein hook,  The phlebectomies were performed using a hemostat to avulse these varicosities.  Adequate hemostasis was achieved, and steri strips were applied to the stab wound.      ABD pads and thigh high compression stockings were applied.  Ace wrap bandages were applied over the phlebectomy sites.  Blood loss was less than 15 cc.  The patient ambulated out of the operating room having tolerated the procedure well. Rankin, Neena Rhymes

## 2011-09-24 ENCOUNTER — Encounter: Payer: Self-pay | Admitting: Vascular Surgery

## 2011-09-24 ENCOUNTER — Telehealth: Payer: Self-pay | Admitting: *Deleted

## 2011-09-24 NOTE — Telephone Encounter (Signed)
09/24/2011  Time: 1:35 PM   Patient Name: [name] [name]  Patient of: T.F. Early  Procedure:stab phlebectomy right leg 10-20   Reached patient at home and checked  Her status  Yes    Comments/Actions Taken: [name] states mild discomfort over stab phlebectomies that is relieved by ice compresses and elevation. No problems with swelling or bleeding/oozing.  Reviewed postprocedural instructions with [name] and reminded her of follow up appointment with Dr. Arbie Cookey on 10-14-2011. Jeramy Dimmick, Neena Rhymes    @SIGNATURE @

## 2011-10-07 ENCOUNTER — Encounter: Payer: Self-pay | Admitting: Internal Medicine

## 2011-10-07 ENCOUNTER — Ambulatory Visit (INDEPENDENT_AMBULATORY_CARE_PROVIDER_SITE_OTHER): Payer: Medicare Other | Admitting: Internal Medicine

## 2011-10-07 DIAGNOSIS — I10 Essential (primary) hypertension: Secondary | ICD-10-CM

## 2011-10-07 DIAGNOSIS — E785 Hyperlipidemia, unspecified: Secondary | ICD-10-CM

## 2011-10-07 NOTE — Assessment & Plan Note (Signed)
Normotensive and stable. Continue current regimen. Monitor bp as outpt and followup in clinic as scheduled. Obtain cbc and chem7 prior to next visit 

## 2011-10-07 NOTE — Patient Instructions (Signed)
Please schedule fasting labs prior to your next appointment ([facility]) Cbc, chem7-v58.69 and lipid/lft 272.4

## 2011-10-07 NOTE — Assessment & Plan Note (Signed)
Hold statin for 1 wk to determine if muscle sx's are related. Obtain lipid/lft prior to next visit

## 2011-10-07 NOTE — Progress Notes (Signed)
  Subjective:    Patient ID: [name] [name], female    DOB: 01/11/1941, 71 y.o.   MRN: [id number]  HPI Pt presents to clinic for followup of multiple medical problems. Note some mild generalized muscle fatigue and wonders if related to statin. Recently s/p right leg phebectomy without complication. Tolerating pradaxa for PAF followed by cardiology. Denies palpitations. BP reveiwed normotensive.   Past Medical History  Diagnosis Date  . Hypertension   . Hyperlipidemia   . Allergic rhinitis   . GERD (gastroesophageal reflux disease)   . Cancer of skin of leg   . History of anemia   . History of poliomyelitis     Polio  age 72- no significant neuromuscular deficit  . IBS (irritable bowel syndrome)   . Collagenous colitis   . Hyperplastic colon polyp 01/14/10  . Atrial fibrillation 06-10-2011  . Potassium deficiency   . Arthritis    Past Surgical History  Procedure Date  . Appendectomy 1944  . Breast biopsy 1947 & 10/07    Benign  . Tonsillectomy 1994  . Skin cancer excision     removal of skin cancer on left leg  . Endovenous ablation saphenous vein w/ laser 03-03-2011 left greater saphenous vein   . Endovenous ablation saphenous vein w/ laser 01-06-2011  right greater saphenous vein    reports that she quit smoking about 24 years ago. Her smoking use included Cigarettes. She quit after 18 years of use. She has never used smokeless tobacco. She reports that she drinks about 7.2 ounces of alcohol per week. She reports that she does not use illicit drugs. family history includes Alcohol abuse in her father; Colon cancer in her maternal grandmother; Heart disease in her father and paternal grandfather; Hypertension in her father and sister; Other in her mother and sister; Peripheral vascular disease in her mother; Stomach cancer in her maternal grandfather; and Stroke in her father. Allergies  Allergen Reactions  . Erythromycin     REACTION: Rash  . Sulfonamide Derivatives     REACTION:  Hives      Review of Systems see hpi     Objective:   Physical Exam  Physical Exam  Nursing note and vitals reviewed. Constitutional: Appears well-developed and well-nourished. No distress.  HENT:  Head: Normocephalic and atraumatic.  Right Ear: External ear normal.  Left Ear: External ear normal.  Eyes: Conjunctivae are normal. No scleral icterus.  Neck: Neck supple. Carotid bruit is not present.  Cardiovascular: Normal rate, regular rhythm and normal heart sounds.  Exam reveals no gallop and no friction rub.   No murmur heard. Pulmonary/Chest: Effort normal and breath sounds normal. No respiratory distress. He has no wheezes. no rales.  Lymphadenopathy:    He has no cervical adenopathy.  Neurological:Alert.  Skin: Skin is warm and dry. Not diaphoretic.  Psychiatric: Has a normal mood and affect.         Assessment & Plan:

## 2011-10-13 ENCOUNTER — Encounter: Payer: Self-pay | Admitting: Vascular Surgery

## 2011-10-14 ENCOUNTER — Ambulatory Visit (INDEPENDENT_AMBULATORY_CARE_PROVIDER_SITE_OTHER): Payer: Medicare Other | Admitting: Vascular Surgery

## 2011-10-14 ENCOUNTER — Encounter: Payer: Self-pay | Admitting: Vascular Surgery

## 2011-10-14 VITALS — BP 132/78 | HR 73 | Resp 18 | Ht 66.0 in | Wt 170.0 lb

## 2011-10-14 DIAGNOSIS — I83893 Varicose veins of bilateral lower extremities with other complications: Secondary | ICD-10-CM

## 2011-10-14 NOTE — Progress Notes (Signed)
Patient is a for followup of her stab phlebectomy of multiple tributary varicosities in her right thigh and pretibial area and calf. He had the usual manner bruising following this. She has resolved all of this and does have excellent early result. She reports that her pain is much better. Her stab sites are all healing and is nearly completely resolved bruising. She will continue her usual activities and when necessary use of her compression garments. She will see [place] again on an as-needed basis

## 2011-10-18 NOTE — Progress Notes (Signed)
Laser Ablation Procedure      Date: 10/18/2011    [name] [name] DOB:09/18/1940  Consent signed: Yes  Surgeon:T.F. Early    BP 144/80  Pulse 80  Resp 18  Ht 5\' 6"  (1.676 m)  Wt 168 lb (76.204 kg)  BMI 27.12 kg/m2  Start time: 11:20AM   End time: 12:30PM  Tumescent Anesthesia: 300 cc 0.9% NaCl with 50 cc Lidocaine HCL with 1% Epi and 15 cc 8.4% NaHCO3  Local Anesthesia: 2 cc Lidocaine HCL and NaHCO3 (ratio 2:1)       Stab Phlebectomy: 10-20 Sites: Thigh, Calf and Ankle  Right leg  Patient tolerated procedure well: Yes  EARLY, TODD  Description of Procedure:  After marking the course of the saphenous vein and the secondary varicosities in the standing position, the patient was placed on the operating table in the supine position, and the right leg was prepped and draped in sterile fashion. The patient was then put into Trendelenburg position.  Local anesthetic was utilized overlying the marked varicosities.  Greater than 10-20 stab wounds were made using the tip of an 11 blade; and using the vein hook,  The phlebectomies were performed using a hemostat to avulse these varicosities.  Adequate hemostasis was achieved, and steri strips were applied to the stab wound.      ABD pads and thigh high compression stockings were applied.  Ace wrap bandages were applied over the phlebectomy sites.  Blood loss was less than 15 cc.  The patient ambulated out of the operating room having tolerated the procedure well. EARLY, TODD

## 2011-10-21 ENCOUNTER — Ambulatory Visit: Payer: Medicare Other | Admitting: Vascular Surgery

## 2011-10-22 ENCOUNTER — Telehealth: Payer: Self-pay | Admitting: Internal Medicine

## 2011-10-22 MED ORDER — OMEPRAZOLE 20 MG PO CPDR: 20.0000 mg | DELAYED_RELEASE_CAPSULE | Freq: Every day | ORAL | Status: DC

## 2011-10-22 NOTE — Telephone Encounter (Signed)
Refill- prilosec otc 20 mg capsule. Take one capsule daily. Qty 42 last fill 1.18.13

## 2011-10-22 NOTE — Telephone Encounter (Signed)
Rx refill sent to pharmacy. 

## 2011-11-04 ENCOUNTER — Encounter: Payer: Self-pay | Admitting: Cardiology

## 2011-11-04 ENCOUNTER — Ambulatory Visit (INDEPENDENT_AMBULATORY_CARE_PROVIDER_SITE_OTHER): Payer: Medicare Other | Admitting: Cardiology

## 2011-11-04 ENCOUNTER — Telehealth: Payer: Self-pay | Admitting: *Deleted

## 2011-11-04 VITALS — BP 143/74 | HR 65 | Wt 172.0 lb

## 2011-11-04 DIAGNOSIS — I10 Essential (primary) hypertension: Secondary | ICD-10-CM

## 2011-11-04 DIAGNOSIS — I4891 Unspecified atrial fibrillation: Secondary | ICD-10-CM

## 2011-11-04 DIAGNOSIS — E876 Hypokalemia: Secondary | ICD-10-CM

## 2011-11-04 DIAGNOSIS — E785 Hyperlipidemia, unspecified: Secondary | ICD-10-CM

## 2011-11-04 LAB — CBC WITH DIFFERENTIAL/PLATELET
Basophils Relative: 1.2 % (ref 0.0–3.0)
Eosinophils Absolute: 0.2 10*3/uL (ref 0.0–0.7)
HCT: 38.6 % (ref 36.0–46.0)
Hemoglobin: 13.2 g/dL (ref 12.0–15.0)
Lymphocytes Relative: 27.9 % (ref 12.0–46.0)
Lymphs Abs: 1.9 10*3/uL (ref 0.7–4.0)
MCHC: 34.1 g/dL (ref 30.0–36.0)
Monocytes Relative: 13.8 % — ABNORMAL HIGH (ref 3.0–12.0)
Neutro Abs: 3.7 10*3/uL (ref 1.4–7.7)
RBC: 3.91 Mil/uL (ref 3.87–5.11)

## 2011-11-04 LAB — BASIC METABOLIC PANEL
CO2: 29 mEq/L (ref 19–32)
Calcium: 9.7 mg/dL (ref 8.4–10.5)
Glucose, Bld: 83 mg/dL (ref 70–99)
Potassium: 3.2 mEq/L — ABNORMAL LOW (ref 3.5–5.1)
Sodium: 137 mEq/L (ref 135–145)

## 2011-11-04 MED ORDER — POTASSIUM CHLORIDE CRYS ER 10 MEQ PO TBCR: 10.0000 meq | EXTENDED_RELEASE_TABLET | Freq: Two times a day (BID) | ORAL | Status: AC

## 2011-11-04 NOTE — Patient Instructions (Signed)
Your physician wants you to follow-up in: 6 MONTHS You will receive a reminder letter in the mail two months in advance. If you don't receive a letter, please call our office to schedule the follow-up appointment.   Your physician recommends that you return for lab work in: TODAY  

## 2011-11-04 NOTE — Assessment & Plan Note (Signed)
Patient remains in sinus rhythm. Continue pradaxa and check renal function and hemoglobin. Continue Cardizem and beta blocker. Will consider antiarrhythmic if more frequent episodes in the future.

## 2011-11-04 NOTE — Assessment & Plan Note (Signed)
Blood pressure mildly elevated. However she follows this at home and it is typically normal. Continue present medications.

## 2011-11-04 NOTE — Assessment & Plan Note (Signed)
Management per primary care. 

## 2011-11-04 NOTE — Progress Notes (Signed)
HPI: Pleasant female for fu of AFib. She was evaluated in 03/2009 for chest pain. ETT-echo was done that was normal at submaximal exercise. Patient had episode of atrial fib in [place] in Oct 2012; treated with cardizem and converted spontaneously. TSH 0.901. Echo in Oct 2012 showed normal LV function. There was grade 1 diastolic dysfunction. There was mild left atrial enlargement and mild right atrial enlargement. There was mild aortic insufficiency. There was mild tricuspid regurgitation. Had [facility] Nov 2012 that showed EF 69 and normal perfusion. Patient had monitor for recurrent palpitations. This showed sinus with pacs. I last saw her in Dec 2012. Since then, there is no dyspnea or chest pain. Occasional brief palpitations. No sustained arrhythmias. No syncope or bleeding.   Current Outpatient Prescriptions  Medication Sig Dispense Refill  . bisoprolol (ZEBETA) 5 MG tablet 1/2 tablet once daily  30 tablet  3  . chlorthalidone (HYGROTON) 25 MG tablet Take 1 tablet (25 mg total) by mouth daily.  30 tablet  11  . dabigatran (PRADAXA) 150 MG CAPS Take 1 capsule (150 mg total) by mouth every 12 (twelve) hours.  60 capsule  11  . desonide (DESOWEN) 0.05 % cream Apply topically 2 (two) times daily.        Marland Kitchen diltiazem (CARDIZEM CD) 120 MG 24 hr capsule Take 1 capsule (120 mg total) by mouth daily.  30 capsule  11  . diphenhydrAMINE (BENADRYL) 25 MG tablet Take 25 mg by mouth at bedtime as needed.        Marland Kitchen losartan (COZAAR) 25 MG tablet Take 1 tablet (25 mg total) by mouth daily.  30 tablet  3  . Multiple Vitamin (MULTIVITAMINS PO) Take by mouth daily.        Marland Kitchen omeprazole (PRILOSEC) 20 MG capsule Take 1 capsule (20 mg total) by mouth daily.  30 capsule  5  . potassium chloride (KLOR-CON) 10 MEQ CR tablet Take 1 tablet (10 mEq total) by mouth daily.  30 tablet  11  . pravastatin (PRAVACHOL) 40 MG tablet Take 1 tablet (40 mg total) by mouth at bedtime.  30 tablet  3     Past Medical History    Diagnosis Date  . Hypertension   . Hyperlipidemia   . Allergic rhinitis   . GERD (gastroesophageal reflux disease)   . Cancer of skin of leg   . History of anemia   . History of poliomyelitis     Polio  age 89- no significant neuromuscular deficit  . IBS (irritable bowel syndrome)   . Collagenous colitis   . Hyperplastic colon polyp 01/14/10  . Atrial fibrillation 06-10-2011  . Potassium deficiency   . Arthritis     Past Surgical History  Procedure Date  . Appendectomy 1944  . Breast biopsy 1947 & 10/07    Benign  . Tonsillectomy 1994  . Skin cancer excision     removal of skin cancer on left leg  . Endovenous ablation saphenous vein w/ laser 03-03-2011 left greater saphenous vein   . Endovenous ablation saphenous vein w/ laser 01-06-2011  right greater saphenous vein    History   Social History  . Marital Status: Married    Spouse Name: N/A    Number of Children: N/A  . Years of Education: N/A   Occupational History  . Not on file.   Social History Main Topics  . Smoking status: Former Smoker -- 18 years    Types: Cigarettes    Quit date: 08/17/1987  .  Smokeless tobacco: Never Used   Comment: quit in 1979  . Alcohol Use: 7.2 oz/week    12 Glasses of wine per week     glass of wine daily  . Drug Use: No  . Sexually Active: Not on file   Other Topics Concern  . Not on file   Social History Narrative   Retired Film/video editor 2 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Married  Former Smoker quit 1989 Alcohol use-yes -  glasses of wine/day   son works for [employer] - she flies to [place] to visit her grandchildren    ROS: Some back pain but no fevers or chills, productive cough, hemoptysis, dysphasia, odynophagia, melena, hematochezia, dysuria, hematuria, rash, seizure activity, orthopnea, PND, pedal edema, claudication. Remaining systems are negative.  Physical Exam: Well-developed well-nourished in no acute distress.  Skin is warm and dry.  HEENT is normal.  Neck is supple. No  thyromegaly.  Chest is clear to auscultation with normal expansion.  Cardiovascular exam is regular rate and rhythm.  Abdominal exam nontender or distended. No masses palpated. Extremities show no edema. neuro grossly intact  ECG sinus rhythm at a rate of 65. No ST changes.

## 2011-11-04 NOTE — Telephone Encounter (Signed)
Message copied by Freddi Starr on Thu Nov 04, 2011  5:21 PM ------      Message from: Lewayne Bunting      Created: Thu Nov 04, 2011  1:14 PM       Change KCL to 20 meq po daily      bmet one week      [name]

## 2011-11-09 ENCOUNTER — Telehealth: Payer: Self-pay | Admitting: Cardiology

## 2011-11-09 ENCOUNTER — Telehealth: Payer: Self-pay | Admitting: Internal Medicine

## 2011-11-09 NOTE — Telephone Encounter (Signed)
Please start Peptobismol 2 tabs po bid ( black stools) and Entecort 3mg  2 tabs/day, #60x 4 weeks then 1 po qd.

## 2011-11-09 NOTE — Telephone Encounter (Signed)
Had an episode with collangenous colitis back in December and was started on Entocort taper. This helped but not like it usually does. When she got down to 1/day, the problems started up again. She has been off the Entocort for 3 weeks and is now having watery, diarrhea so frequently that she is afraid to leave the house.  Denies bleeding, nausea , fever or vomiting. She has not taken antibiotics recently. She did travel out of the country in January but reports she was doing fine then and when she came back.  Please, advise.

## 2011-11-09 NOTE — Telephone Encounter (Signed)
Left message for pt, order is for lab work this week.

## 2011-11-09 NOTE — Telephone Encounter (Signed)
Pt calling re a test order she got in the mail for bmet with gfr, is this for her blood work this Thursday with her other office or here in June? And wants to know what it is?

## 2011-11-10 MED ORDER — BUDESONIDE 3 MG PO CP24: ORAL_CAPSULE | ORAL | Status: DC

## 2011-11-10 NOTE — Telephone Encounter (Signed)
Rx sent to pharmacy. Left a message for patient to call me. 

## 2011-11-10 NOTE — Telephone Encounter (Signed)
Spoke with patient and gave her recommendations by Dr. Juanda Chance.

## 2011-11-11 ENCOUNTER — Other Ambulatory Visit: Payer: Self-pay | Admitting: Cardiology

## 2011-11-12 LAB — BASIC METABOLIC PANEL WITH GFR
BUN: 25 mg/dL — ABNORMAL HIGH (ref 6–23)
CO2: 28 mEq/L (ref 19–32)
Calcium: 9.9 mg/dL (ref 8.4–10.5)
Chloride: 103 mEq/L (ref 96–112)
Creat: 0.77 mg/dL (ref 0.50–1.10)

## 2011-11-15 ENCOUNTER — Other Ambulatory Visit: Payer: Self-pay | Admitting: *Deleted

## 2011-11-15 DIAGNOSIS — E876 Hypokalemia: Secondary | ICD-10-CM

## 2011-11-22 ENCOUNTER — Other Ambulatory Visit: Payer: Self-pay | Admitting: Cardiology

## 2011-11-22 ENCOUNTER — Telehealth: Payer: Self-pay | Admitting: Internal Medicine

## 2011-11-22 MED ORDER — LOSARTAN POTASSIUM 25 MG PO TABS: 25.0000 mg | ORAL_TABLET | Freq: Every day | ORAL | Status: DC

## 2011-11-22 NOTE — Telephone Encounter (Signed)
Rx refill sent to pharmacy. 

## 2011-11-23 ENCOUNTER — Telehealth: Payer: Self-pay | Admitting: Internal Medicine

## 2011-11-23 LAB — BASIC METABOLIC PANEL WITH GFR
Chloride: 101 mEq/L (ref 96–112)
GFR, Est Non African American: 71 mL/min
Potassium: 3.5 mEq/L (ref 3.5–5.3)

## 2011-11-23 NOTE — Telephone Encounter (Signed)
Please continue Entecort 3 tabs ( 9 mg) daily for 4 more weeks, then call us with an update

## 2011-11-23 NOTE — Telephone Encounter (Signed)
Patient states that she was advised to begin taking Entocort 2 tablets daily x 4 weeks, then decrease to 1 tablet daily  (see telephone note from 11/09/11) due to recurrent symptoms attributed to collagenous colitis. Patient states that she started the 2 tablets and continued doing so daily for several days but got no relief. She then increased the Entocort on her own to 3 tablets daily. She states that since doing that, her symptoms of diarrhea have dramatically improved. She has no rectal bleeding, no nausea, vomiting or fever. She would like to know if you would like her to continue on 3 tablets daily or if you have any other advice for her.

## 2011-11-24 NOTE — Telephone Encounter (Signed)
Advised patient of Dr Regino Schultze recommendations and she verbalizes understanding.

## 2011-11-29 ENCOUNTER — Telehealth: Payer: Self-pay | Admitting: Cardiology

## 2011-11-29 NOTE — Telephone Encounter (Signed)
Spoke with pt, aware of lab results. 

## 2011-11-29 NOTE — Telephone Encounter (Signed)
Pt calling for blood work results, pls call

## 2011-11-30 ENCOUNTER — Other Ambulatory Visit: Payer: Self-pay | Admitting: Internal Medicine

## 2011-11-30 MED ORDER — BUDESONIDE 3 MG PO CP24: ORAL_CAPSULE | ORAL | Status: DC

## 2011-11-30 NOTE — Telephone Encounter (Signed)
Rx sent 

## 2011-12-07 ENCOUNTER — Encounter: Payer: Self-pay | Admitting: Cardiology

## 2011-12-13 ENCOUNTER — Telehealth: Payer: Self-pay | Admitting: Internal Medicine

## 2011-12-13 NOTE — Telephone Encounter (Signed)
Patient calling she had been on Entocort 3 tab/Day x 4 weeks. Yesterday, she decreased herself to 2tab/day. She wants to know how Dr. Juanda Chance would want her to taper down. States she did have to take Imodium x 1 after eating sweet sour chicken but overall has done well. Please, advise.

## 2011-12-13 NOTE — Telephone Encounter (Signed)
Please taper Entecort down to 6mg  /day x 2 weeks, then 3 mg/day x 2 weeks then stop.

## 2011-12-14 ENCOUNTER — Encounter: Payer: Self-pay | Admitting: Internal Medicine

## 2011-12-14 ENCOUNTER — Ambulatory Visit (INDEPENDENT_AMBULATORY_CARE_PROVIDER_SITE_OTHER): Payer: Medicare Other | Admitting: Internal Medicine

## 2011-12-14 VITALS — BP 124/80 | HR 78 | Temp 97.8°F | Ht 66.5 in | Wt 176.0 lb

## 2011-12-14 DIAGNOSIS — M7918 Myalgia, other site: Secondary | ICD-10-CM

## 2011-12-14 DIAGNOSIS — IMO0001 Reserved for inherently not codable concepts without codable children: Secondary | ICD-10-CM

## 2011-12-14 NOTE — Telephone Encounter (Signed)
Spoke with patient and gave her Dr. Brodie's recommendations. 

## 2011-12-14 NOTE — Telephone Encounter (Signed)
Left a message for patient to call me. 

## 2011-12-19 DIAGNOSIS — M7918 Myalgia, other site: Secondary | ICD-10-CM | POA: Insufficient documentation

## 2011-12-19 NOTE — Progress Notes (Signed)
  Subjective:    Patient ID: [name] [name], female    DOB: [birth year], 71 y.o.   MRN: [id number]  HPI Pt presents to clinic for followup of multiple medical problems. Suffered fall last wee out of town and seen in local ED. Developed left ankle pain and sternal pain. Underwent ankle and cxr without fx. Using brace for ankle and has no problems wt bearing. Still having sternal pain both positional and reproducible. Taking hydrocodone prn. Not taking nsaids due to pradaxa use. Total time of visit 16 minutes of which >50% spent in counseling.  Past Medical History  Diagnosis Date  . Hypertension   . Hyperlipidemia   . Allergic rhinitis   . GERD (gastroesophageal reflux disease)   . Cancer of skin of leg   . History of anemia   . History of poliomyelitis     Polio  age 89- no significant neuromuscular deficit  . IBS (irritable bowel syndrome)   . Collagenous colitis   . Hyperplastic colon polyp 01/14/10  . Atrial fibrillation 06-10-2011  . Potassium deficiency   . Arthritis    Past Surgical History  Procedure Date  . Appendectomy 1944  . Breast biopsy 1947 & 10/07    Benign  . Tonsillectomy 1994  . Skin cancer excision     removal of skin cancer on left leg  . Endovenous ablation saphenous vein w/ laser 03-03-2011 left greater saphenous vein   . Endovenous ablation saphenous vein w/ laser 01-06-2011  right greater saphenous vein    reports that she quit smoking about 24 years ago. Her smoking use included Cigarettes. She quit after 18 years of use. She has never used smokeless tobacco. She reports that she drinks about 7.2 ounces of alcohol per week. She reports that she does not use illicit drugs. family history includes Alcohol abuse in her father; Colon cancer in her maternal grandmother; Heart disease in her father and paternal grandfather; Hypertension in her father and sister; Other in her mother and sister; Peripheral vascular disease in her mother; Stomach cancer in her maternal  grandfather; and Stroke in her father. Allergies  Allergen Reactions  . Erythromycin     REACTION: Rash  . Sulfonamide Derivatives     REACTION: Hives      Review of Systems see hpi     Objective:   Physical Exam  Nursing note and vitals reviewed. Constitutional: She appears well-developed and well-nourished. No distress.  HENT:  Head: Normocephalic and atraumatic.  Musculoskeletal:       +sternal tenderness without bony abn  Neurological: She is alert.  Skin: Skin is warm and dry. She is not diaphoretic.  Psychiatric: She has a normal mood and affect.          Assessment & Plan:

## 2011-12-19 NOTE — Assessment & Plan Note (Signed)
Continue analgesia prn. Followup if no improvement or worsening.

## 2012-02-07 ENCOUNTER — Other Ambulatory Visit: Payer: Self-pay | Admitting: Internal Medicine

## 2012-02-08 LAB — BASIC METABOLIC PANEL
CO2: 28 mEq/L (ref 19–32)
Calcium: 9.7 mg/dL (ref 8.4–10.5)
Chloride: 101 mEq/L (ref 96–112)
Potassium: 3.6 mEq/L (ref 3.5–5.3)
Sodium: 142 mEq/L (ref 135–145)

## 2012-02-08 LAB — LIPID PANEL
HDL: 64 mg/dL (ref >39)
LDL Cholesterol: 145 mg/dL — ABNORMAL HIGH (ref 0–99)
Total CHOL/HDL Ratio: 3.6 Ratio

## 2012-02-08 LAB — HEPATIC FUNCTION PANEL
AST: 21 U/L (ref 0–37)
Albumin: 4.3 g/dL (ref 3.5–5.2)
Total Bilirubin: 0.5 mg/dL (ref 0.3–1.2)

## 2012-02-15 ENCOUNTER — Telehealth: Payer: Self-pay | Admitting: Internal Medicine

## 2012-02-15 NOTE — Telephone Encounter (Signed)
Patient made a follow up appointment for 02/29/12. She states that she already had labs done on 6/24 at [facility] in [place]. She wants to know if these labs will still be good for July's visit?

## 2012-02-15 NOTE — Telephone Encounter (Signed)
Just did labs last month. Ok to hold off on any further labs. Will review and discuss at visit

## 2012-02-15 NOTE — Telephone Encounter (Signed)
Informed patient/SLS 

## 2012-02-22 ENCOUNTER — Telehealth: Payer: Self-pay | Admitting: Internal Medicine

## 2012-02-22 MED ORDER — BISOPROLOL FUMARATE 5 MG PO TABS: ORAL_TABLET | ORAL | Status: DC

## 2012-02-22 NOTE — Telephone Encounter (Signed)
Patient states that she is on vacation and would like a refill of bisoprolol to be sent to [facility] at [address] in [place]. Phone: [phone]. She states that she is out of medication

## 2012-02-22 NOTE — Telephone Encounter (Signed)
Rx sent 

## 2012-02-29 ENCOUNTER — Encounter: Payer: Self-pay | Admitting: Internal Medicine

## 2012-02-29 ENCOUNTER — Ambulatory Visit (INDEPENDENT_AMBULATORY_CARE_PROVIDER_SITE_OTHER): Payer: Medicare Other | Admitting: Internal Medicine

## 2012-02-29 VITALS — BP 142/86 | HR 71 | Temp 98.3°F | Resp 14 | Wt 172.5 lb

## 2012-02-29 DIAGNOSIS — I10 Essential (primary) hypertension: Secondary | ICD-10-CM

## 2012-02-29 DIAGNOSIS — M7989 Other specified soft tissue disorders: Secondary | ICD-10-CM

## 2012-02-29 DIAGNOSIS — E785 Hyperlipidemia, unspecified: Secondary | ICD-10-CM

## 2012-02-29 MED ORDER — FUROSEMIDE 20 MG PO TABS: ORAL_TABLET | ORAL | Status: DC

## 2012-02-29 MED ORDER — LORAZEPAM 1 MG PO TABS: ORAL_TABLET | ORAL | Status: AC

## 2012-02-29 NOTE — Patient Instructions (Signed)
Please schedule fasting labs prior to your next appointment Lipid/lft 272.4 and chem7-v58.69

## 2012-02-29 NOTE — Progress Notes (Signed)
  Subjective:    Patient ID: [name] [name], female    DOB: 1941-01-21, 71 y.o.   MRN: [id number]  HPI Pt presents to clinic for followup of multiple medical problems. Notes ~3wk h/o bilateral lower extremity swelling. Is improved in am and worsens during the day. No obvious VTE risk factors. Has h/o venous varicosities and takes calcium channel blocker for h/o PAF. Reviewed elevated cholesterol off statin tx. Has anxiety flying and has pre-tx'ed with ativan in the past without difficulty. Blood pressure mildly elevated but was delayed in traffic jam on the way to appointment.   Past Medical History  Diagnosis Date  . Hypertension   . Hyperlipidemia   . Allergic rhinitis   . GERD (gastroesophageal reflux disease)   . Cancer of skin of leg   . History of anemia   . History of poliomyelitis     Polio  age 52- no significant neuromuscular deficit  . IBS (irritable bowel syndrome)   . Collagenous colitis   . Hyperplastic colon polyp 01/14/10  . Atrial fibrillation 06-10-2011  . Potassium deficiency   . Arthritis    Past Surgical History  Procedure Date  . Appendectomy 1944  . Breast biopsy 1947 & 10/07    Benign  . Tonsillectomy 1994  . Skin cancer excision     removal of skin cancer on left leg  . Endovenous ablation saphenous vein w/ laser 03-03-2011 left greater saphenous vein   . Endovenous ablation saphenous vein w/ laser 01-06-2011  right greater saphenous vein    reports that she quit smoking about 24 years ago. Her smoking use included Cigarettes. She quit after 18 years of use. She has never used smokeless tobacco. She reports that she drinks about 7.2 ounces of alcohol per week. She reports that she does not use illicit drugs. family history includes Alcohol abuse in her father; Colon cancer in her maternal grandmother; Heart disease in her father and paternal grandfather; Hypertension in her father and sister; Other in her mother and sister; Peripheral vascular disease in her  mother; Stomach cancer in her maternal grandfather; and Stroke in her father. Allergies  Allergen Reactions  . Erythromycin     REACTION: Rash  . Sulfonamide Derivatives     REACTION: Hives      Review of Systems see hpi     Objective:   Physical Exam  Nursing note and vitals reviewed. Constitutional: She appears well-developed and well-nourished. No distress.  HENT:  Head: Normocephalic and atraumatic.  Right Ear: External ear normal.  Left Ear: External ear normal.  Eyes: Conjunctivae are normal. No scleral icterus.  Neck: Neck supple. No JVD present.  Cardiovascular: Normal rate, regular rhythm and normal heart sounds.  Exam reveals no gallop and no friction rub.   No murmur heard. Pulmonary/Chest: Effort normal and breath sounds normal. No respiratory distress. She has no wheezes. She has no rales.  Neurological: She is alert.  Skin: Skin is warm and dry. She is not diaphoretic.       +1 bilateral LE edema  Psychiatric: She has a normal mood and affect.          Assessment & Plan:

## 2012-02-29 NOTE — Assessment & Plan Note (Signed)
Likely multifactorial. No suspicion for DVT. Attempt lasix prn.

## 2012-02-29 NOTE — Assessment & Plan Note (Signed)
Isolated elevation attributable to stress. Monitor as outpt.

## 2012-02-29 NOTE — Assessment & Plan Note (Signed)
Resume statin tx. Obtain lipid/lft prior to next visit

## 2012-03-31 ENCOUNTER — Other Ambulatory Visit: Payer: Self-pay | Admitting: Internal Medicine

## 2012-03-31 NOTE — Telephone Encounter (Signed)
Pravastatin request; Rx shows D/C in EMR on 04.30.13 for non-usage by patient, not given as active medication at 07.16.13 OV/SLS Please advise.

## 2012-04-19 ENCOUNTER — Telehealth: Payer: Self-pay | Admitting: Internal Medicine

## 2012-04-19 MED ORDER — BISOPROLOL FUMARATE 5 MG PO TABS: ORAL_TABLET | ORAL | Status: DC

## 2012-04-19 MED ORDER — LOSARTAN POTASSIUM 25 MG PO TABS: 25.0000 mg | ORAL_TABLET | Freq: Every day | ORAL | Status: DC

## 2012-04-19 NOTE — Telephone Encounter (Signed)
Refill- losartan potassium 25mg  tab. Take one tablet by mouth every day. Qty 30 last fill 8.5.13  Refill- bisoprolol fumarate 5mg  tab. Take 1/2 tablet by mouth every day. Qty 30 last fill 7.9.13

## 2012-04-19 NOTE — Telephone Encounter (Signed)
Rx[s] done/SLS Zebeta: PATIENT DUE FOR LABS-REQUIRED PRIOR TO FUTURE REFILLS.

## 2012-05-12 ENCOUNTER — Telehealth: Payer: Self-pay | Admitting: Cardiology

## 2012-05-12 NOTE — Telephone Encounter (Signed)
Ok to take an additional cardizem [facility]

## 2012-05-12 NOTE — Telephone Encounter (Signed)
Pt has questions re take extra meds due to episode of high heartrate pls [phone] or cell [phone]

## 2012-05-12 NOTE — Telephone Encounter (Signed)
Spoke with pt, she had an episode of a fib and had to go to the ER in [place] the first of sept. Has had no other problems until this am. She is currently in a fib with a rate of 110-120. She took her diltiazem and bisoprolol around 8 am this am. She feels okay just a little fatigued. She wants to know if she can take an extra diltiazem to avoid going to the ER again. She has not checked her BP yet. Will forward for dr Jens Som review

## 2012-05-12 NOTE — Telephone Encounter (Signed)
Spoke with [name] [name], she reports her bp is good at 120/70. Aware of dr Ludwig Clarks recommendations.

## 2012-05-18 ENCOUNTER — Telehealth: Payer: Self-pay | Admitting: *Deleted

## 2012-05-18 NOTE — Telephone Encounter (Signed)
Pt reports she had Labs done at [facility] in [place] 09.26.13 [Lipid panel]; she would like to know if results have been received and what she needs to do about cholesterol medication. Also, has OV scheduled w/Dr. Burnard Hawthorne has been monitoring] d/u a visit to ED in [place] during the first week of September for Afib/SLS Please advise.

## 2012-05-18 NOTE — Telephone Encounter (Signed)
Wondered what they were. The fax said [facility].  Her cholesterol is improved from June. ldl 118 down from 145. Total now nl. lft and chem7 nl. Would continue chol medication. Will scan results

## 2012-05-19 NOTE — Telephone Encounter (Signed)
10.03.13: patient informed & instructed to follow-up w/Dr. Jens Som [cardio] for medical needs related to Afib/SLS

## 2012-05-23 ENCOUNTER — Encounter: Payer: Self-pay | Admitting: Cardiology

## 2012-05-23 ENCOUNTER — Ambulatory Visit (INDEPENDENT_AMBULATORY_CARE_PROVIDER_SITE_OTHER): Payer: Medicare Other | Admitting: Cardiology

## 2012-05-23 VITALS — BP 147/79 | HR 80 | Ht 66.0 in | Wt 168.0 lb

## 2012-05-23 DIAGNOSIS — E785 Hyperlipidemia, unspecified: Secondary | ICD-10-CM

## 2012-05-23 DIAGNOSIS — I4891 Unspecified atrial fibrillation: Secondary | ICD-10-CM

## 2012-05-23 DIAGNOSIS — I10 Essential (primary) hypertension: Secondary | ICD-10-CM

## 2012-05-23 NOTE — Assessment & Plan Note (Signed)
Continue present medications. 

## 2012-05-23 NOTE — Assessment & Plan Note (Signed)
Continue statin. 

## 2012-05-23 NOTE — Patient Instructions (Addendum)
Your physician wants you to follow-up in: 6 MONTHS WITH DR CRENSHAW You will receive a reminder letter in the mail two months in advance. If you don't receive a letter, please call our office to schedule the follow-up appointment.  

## 2012-05-23 NOTE — Assessment & Plan Note (Addendum)
Patient has had 2 brief episodes of atrial fibrillation recently. Continue calcium blocker and beta blocker. Continue pradaxa. If episodes become more frequent we will add flecainide. We will obtain records concerning recent hemoglobin and renal function.

## 2012-05-23 NOTE — Progress Notes (Signed)
HPI: Pleasant female for fu of AFib. She was evaluated in 03/2009 for chest pain. ETT-echo was done that was normal at submaximal exercise. Patient had episode of atrial fib in [place] in Oct 2012; treated with cardizem and converted spontaneously. TSH 0.901. Echo in Oct 2012 showed normal LV function. There was grade 1 diastolic dysfunction. There was mild left atrial enlargement and mild right atrial enlargement. There was mild aortic insufficiency. There was mild tricuspid regurgitation. Had [facility] Nov 2012 that showed EF 69 and normal perfusion. Patient had monitor for recurrent palpitations. This showed sinus with pacs. I last saw her in March of 2013. Since then, she had 2 episodes of atrial fibrillation. One occurred the day after Labor Day and she converted spontaneously at a hospital in [facility]. She had a second episode that converted in several hours. She otherwise denies dyspnea, chest pain, syncope or bleeding.  Current Outpatient Prescriptions  Medication Sig Dispense Refill  . bisoprolol (ZEBETA) 5 MG tablet Take 1/2 tablet by mouth once daily.  30 tablet  0  . chlorthalidone (HYGROTON) 25 MG tablet Take 1 tablet (25 mg total) by mouth daily.  30 tablet  11  . dabigatran (PRADAXA) 150 MG CAPS Take 1 capsule (150 mg total) by mouth every 12 (twelve) hours.  60 capsule  11  . desonide (DESOWEN) 0.05 % cream Apply topically 2 (two) times daily.        Marland Kitchen diltiazem (CARDIZEM CD) 120 MG 24 hr capsule Take 1 capsule (120 mg total) by mouth daily.  30 capsule  11  . diphenhydrAMINE (BENADRYL) 25 MG tablet Take 25 mg by mouth at bedtime as needed.        . furosemide (LASIX) 20 MG tablet One by mouth daily as needed for leg swelling  30 tablet  1  . losartan (COZAAR) 25 MG tablet Take 1 tablet (25 mg total) by mouth daily.  30 tablet  3  . Multiple Vitamin (MULTIVITAMINS PO) Take by mouth daily.        Marland Kitchen omeprazole (PRILOSEC) 20 MG capsule Take 1 capsule (20 mg total) by mouth daily.   30 capsule  5  . potassium chloride (K-DUR,KLOR-CON) 10 MEQ tablet Take 1 tablet (10 mEq total) by mouth 2 (two) times daily.  60 tablet  12  . pravastatin (PRAVACHOL) 40 MG tablet TAKE ONE TABLET BY MOUTH AT BEDTIME  30 tablet  6  . DISCONTD: Omeprazole 20 MG TBEC TAKE 1 TABLET BY MOUTH EVERY DAY  30 tablet  5  . DISCONTD: potassium chloride (KLOR-CON) 10 MEQ CR tablet Take 1 tablet (10 mEq total) by mouth daily.  30 tablet  11     Past Medical History  Diagnosis Date  . Hypertension   . Hyperlipidemia   . Allergic rhinitis   . GERD (gastroesophageal reflux disease)   . Cancer of skin of leg   . History of anemia   . History of poliomyelitis     Polio  age 71- no significant neuromuscular deficit  . IBS (irritable bowel syndrome)   . Collagenous colitis   . Hyperplastic colon polyp 01/14/10  . Atrial fibrillation 06-10-2011  . Potassium deficiency   . Arthritis     Past Surgical History  Procedure Date  . Appendectomy 1944  . Breast biopsy 1947 & 10/07    Benign  . Tonsillectomy 1994  . Skin cancer excision     removal of skin cancer on left leg  . Endovenous  ablation saphenous vein w/ laser 71-18-2012 left greater saphenous vein   . Endovenous ablation saphenous vein w/ laser 71-23-2012  right greater saphenous vein    History   Social History  . Marital Status: Married    Spouse Name: N/A    Number of Children: N/A  . Years of Education: N/A   Occupational History  . Not on file.   Social History Main Topics  . Smoking status: Former Smoker -- 18 years    Types: Cigarettes    Quit date: 08/17/1987  . Smokeless tobacco: Never Used   Comment: quit in 1979  . Alcohol Use: 7.2 oz/week    12 Glasses of wine per week     glass of wine daily  . Drug Use: No  . Sexually Active: Not on file   Other Topics Concern  . Not on file   Social History Narrative   Retired Film/video editor 2 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Married  Former Smoker quit 1989 Alcohol use-yes -  glasses of  wine/day   son works for [employer] - she flies to [place] to visit her grandchildren    ROS: no fevers or chills, productive cough, hemoptysis, dysphasia, odynophagia, melena, hematochezia, dysuria, hematuria, rash, seizure activity, orthopnea, PND, pedal edema, claudication. Remaining systems are negative.  Physical Exam: Well-developed well-nourished in no acute distress.  Skin is warm and dry.  HEENT is normal.  Neck is supple.  Chest is clear to auscultation with normal expansion.  Cardiovascular exam is regular rate and rhythm.  Abdominal exam nontender or distended. No masses palpated. Extremities show no edema. neuro grossly intact  ECG sinus rhythm at a rate of 80. Occasional PAC. No ST changes.

## 2012-06-09 ENCOUNTER — Encounter: Payer: Self-pay | Admitting: Internal Medicine

## 2012-06-09 ENCOUNTER — Ambulatory Visit (INDEPENDENT_AMBULATORY_CARE_PROVIDER_SITE_OTHER): Payer: Medicare Other | Admitting: Internal Medicine

## 2012-06-09 VITALS — BP 134/82 | HR 67 | Temp 98.0°F | Resp 14 | Wt 167.0 lb

## 2012-06-09 DIAGNOSIS — E785 Hyperlipidemia, unspecified: Secondary | ICD-10-CM

## 2012-06-09 DIAGNOSIS — M1712 Unilateral primary osteoarthritis, left knee: Secondary | ICD-10-CM | POA: Insufficient documentation

## 2012-06-09 DIAGNOSIS — M1710 Unilateral primary osteoarthritis, unspecified knee: Secondary | ICD-10-CM

## 2012-06-09 DIAGNOSIS — I10 Essential (primary) hypertension: Secondary | ICD-10-CM

## 2012-06-09 DIAGNOSIS — Z23 Encounter for immunization: Secondary | ICD-10-CM

## 2012-06-09 NOTE — Progress Notes (Signed)
  Subjective:    Patient ID: [name] [name], female    DOB: [birth year], 71 y.o.   MRN: [id number]  HPI Pt presents to clinic for followup of multiple medical problems. Intermittent episodes of atrial fibrillation maintained on protects her without gross active bleeding. Followed by cardiology. Reviewed last labs with improved LDL and total cholesterol. Interested in ophthalmology referral in the future for consideration of Lasik but wishes to obtain word-of-mouth recommendation first. Has chronic knee pain from osteoarthritis and request handicap placard.  Past Medical History  Diagnosis Date  . Hypertension   . Hyperlipidemia   . Allergic rhinitis   . GERD (gastroesophageal reflux disease)   . Cancer of skin of leg   . History of anemia   . History of poliomyelitis     Polio  age 40- no significant neuromuscular deficit  . IBS (irritable bowel syndrome)   . Collagenous colitis   . Hyperplastic colon polyp 01/14/10  . Atrial fibrillation 06-10-2011  . Potassium deficiency   . Arthritis    Past Surgical History  Procedure Date  . Appendectomy 1944  . Breast biopsy 1947 & 10/07    Benign  . Tonsillectomy 1994  . Skin cancer excision     removal of skin cancer on left leg  . Endovenous ablation saphenous vein w/ laser 03-03-2011 left greater saphenous vein   . Endovenous ablation saphenous vein w/ laser 01-06-2011  right greater saphenous vein    reports that she quit smoking about 24 years ago. Her smoking use included Cigarettes. She quit after 18 years of use. She has never used smokeless tobacco. She reports that she drinks about 7.2 ounces of alcohol per week. She reports that she does not use illicit drugs. family history includes Alcohol abuse in her father; Colon cancer in her maternal grandmother; Heart disease in her father and paternal grandfather; Hypertension in her father and sister; Other in her mother and sister; Peripheral vascular disease in her mother; Stomach cancer in  her maternal grandfather; and Stroke in her father. Allergies  Allergen Reactions  . Erythromycin     REACTION: Rash  . Sulfonamide Derivatives     REACTION: Hives      Review of Systems see hpi     Objective:   Physical Exam  Physical Exam  Nursing note and vitals reviewed. Constitutional: Appears well-developed and well-nourished. No distress.  HENT:  Head: Normocephalic and atraumatic.  Right Ear: External ear normal.  Left Ear: External ear normal.  Eyes: Conjunctivae are normal. No scleral icterus.  Neck: Neck supple. Carotid bruit is not present.  Cardiovascular: Normal rate, regular rhythm and normal heart sounds.  Exam reveals no gallop and no friction rub.   No murmur heard. Pulmonary/Chest: Effort normal and breath sounds normal. No respiratory distress. He has no wheezes. no rales.  Lymphadenopathy:    He has no cervical adenopathy.  Neurological:Alert.  Skin: Skin is warm and dry. Not diaphoretic.  Psychiatric: Has a normal mood and affect.        Assessment & Plan:

## 2012-06-09 NOTE — Assessment & Plan Note (Signed)
Improved. Continue statin therapy. 

## 2012-06-09 NOTE — Assessment & Plan Note (Signed)
Handicap placard form provided

## 2012-06-09 NOTE — Assessment & Plan Note (Signed)
Average control. Continue current regimen. 

## 2012-06-15 ENCOUNTER — Other Ambulatory Visit: Payer: Self-pay | Admitting: Internal Medicine

## 2012-06-26 ENCOUNTER — Other Ambulatory Visit: Payer: Self-pay | Admitting: *Deleted

## 2012-06-26 MED ORDER — DILTIAZEM HCL ER COATED BEADS 120 MG PO CP24: 120.0000 mg | ORAL_CAPSULE | Freq: Every day | ORAL | Status: DC

## 2012-07-21 ENCOUNTER — Telehealth: Payer: Self-pay | Admitting: Cardiology

## 2012-07-21 NOTE — Telephone Encounter (Signed)
New Problem:    Patient called in needing a refill of her dabigatran (PRADAXA) 150 MG CAPS.

## 2012-07-24 MED ORDER — DABIGATRAN ETEXILATE MESYLATE 150 MG PO CAPS: 150.0000 mg | ORAL_CAPSULE | Freq: Two times a day (BID) | ORAL | Status: DC

## 2012-08-15 ENCOUNTER — Telehealth: Payer: Self-pay | Admitting: Internal Medicine

## 2012-08-15 MED ORDER — LOSARTAN POTASSIUM 25 MG PO TABS: 25.0000 mg | ORAL_TABLET | Freq: Every day | ORAL | Status: DC

## 2012-08-15 NOTE — Telephone Encounter (Signed)
Rx to pharmacy/SLS 

## 2012-08-15 NOTE — Telephone Encounter (Signed)
Refill- losartan 25mg  tab. Take one tablet daily. Qty 30 last fill 12.4.13

## 2012-08-17 ENCOUNTER — Other Ambulatory Visit: Payer: Self-pay | Admitting: *Deleted

## 2012-08-17 MED ORDER — CHLORTHALIDONE 25 MG PO TABS: 25.0000 mg | ORAL_TABLET | Freq: Every day | ORAL | Status: DC

## 2012-09-25 ENCOUNTER — Telehealth: Payer: Self-pay

## 2012-09-25 MED ORDER — OMEPRAZOLE 20 MG PO CPDR: 20.0000 mg | DELAYED_RELEASE_CAPSULE | Freq: Every day | ORAL | Status: DC

## 2012-09-25 NOTE — Telephone Encounter (Signed)
Pt left a message stating her Omeprazole needed to be refilled and she would like the quantity to be 42

## 2012-10-10 ENCOUNTER — Telehealth: Payer: Self-pay | Admitting: Cardiology

## 2012-10-10 NOTE — Telephone Encounter (Signed)
Pt having cataract surgery on Monday and clearance request was faxed yesterday

## 2012-10-12 NOTE — Telephone Encounter (Signed)
Clearance in medical records to be faxed today

## 2012-10-12 NOTE — Telephone Encounter (Signed)
[name] [name] needs clearance today or they have to rsc surgery

## 2012-11-03 ENCOUNTER — Other Ambulatory Visit: Payer: Self-pay | Admitting: Internal Medicine

## 2012-11-03 NOTE — Telephone Encounter (Signed)
Rx request to pharmacy/SLS  

## 2012-11-06 ENCOUNTER — Telehealth: Payer: Self-pay | Admitting: Cardiology

## 2012-11-06 NOTE — Telephone Encounter (Signed)
Pt rtn call, pls call cell [phone]

## 2012-11-06 NOTE — Telephone Encounter (Signed)
Pradaxa cannot be lowered; would avoid NSAIDS if possible [name] [name]

## 2012-11-06 NOTE — Telephone Encounter (Addendum)
Pt calling re reducing pradaxa in order to take more ansaids  For pain in knee

## 2012-11-06 NOTE — Telephone Encounter (Signed)
Pt would like to know if it is possible for her to have Pradaxa dose decreased so pt safely can take Ibuprofen twice or 3 times a week. Pt states she is a candidate for  knee replacement and she is buying time so she can go out the country to see her grand-children's prior the surgery. Pt is planing to have the surgery in September this year. Pt took an Ibuprofen last Saturday after not taken it for a months , she could feel the difference a hugh relief from pain. Pt would like to know if this is possible to do it safety. She had a cortisone shot last week, but the Ibuprofen works better.

## 2012-11-06 NOTE — Telephone Encounter (Signed)
LMTCB

## 2012-11-09 NOTE — Telephone Encounter (Signed)
Left message of dr crenshaw's recommendations. 

## 2012-11-22 ENCOUNTER — Telehealth: Payer: Self-pay | Admitting: Cardiology

## 2012-11-22 ENCOUNTER — Telehealth: Payer: Self-pay | Admitting: Internal Medicine

## 2012-11-22 NOTE — Telephone Encounter (Signed)
Would avoid if possible given pradaxa use [name]

## 2012-11-22 NOTE — Telephone Encounter (Signed)
New problem    Orthopedics wants to know can patient use  Voltraen gel rub on her knee .

## 2012-11-22 NOTE — Telephone Encounter (Signed)
Will forward to Dr. Jens Som to address.

## 2012-11-22 NOTE — Telephone Encounter (Signed)
Patient states that she is having to fly internationally next week and would like to know if Dr. Abner Greenspan would prescribe her the same medication that Dr. Rodena Medin did last year? She says that the medication was 4 tablets but does not remember the name of the drug. [facility].

## 2012-11-23 NOTE — Telephone Encounter (Signed)
Spoke with pt, Aware of dr crenshaw's recommendations.  °

## 2012-11-23 NOTE — Telephone Encounter (Signed)
Please advise 

## 2012-11-23 NOTE — Telephone Encounter (Signed)
What was it for and what time of year was it so I can figure out what he gave her

## 2012-11-24 MED ORDER — LORAZEPAM 1 MG PO TABS: 1.0000 mg | ORAL_TABLET | ORAL | Status: DC

## 2012-11-24 NOTE — Telephone Encounter (Signed)
Patient states it is to help with claustrophobia and it was Jan 2013 the last it was gave. Please advise?

## 2012-11-24 NOTE — Telephone Encounter (Signed)
Patient states it is to help with claustrophobia and it was Jan 2013 the last it was gave. Please advise?       I went back and looked and it was Ativan 1 mg 30 minutes prior to flight quantity 5 with 0 refills.  I will go ahead and print and fax

## 2012-11-27 ENCOUNTER — Telehealth: Payer: Self-pay | Admitting: Internal Medicine

## 2012-11-27 MED ORDER — LOSARTAN POTASSIUM 25 MG PO TABS: 25.0000 mg | ORAL_TABLET | Freq: Every day | ORAL | Status: DC

## 2012-11-27 NOTE — Telephone Encounter (Signed)
Losartan 25 mg take 1 tablet daily qty 30

## 2012-12-08 ENCOUNTER — Telehealth: Payer: Self-pay | Admitting: *Deleted

## 2012-12-08 ENCOUNTER — Other Ambulatory Visit: Payer: Self-pay | Admitting: *Deleted

## 2012-12-08 MED ORDER — POTASSIUM CHLORIDE ER 10 MEQ PO TBCR: 10.0000 meq | EXTENDED_RELEASE_TABLET | Freq: Two times a day (BID) | ORAL | Status: DC

## 2012-12-26 ENCOUNTER — Encounter: Payer: Self-pay | Admitting: Cardiology

## 2012-12-26 ENCOUNTER — Ambulatory Visit (INDEPENDENT_AMBULATORY_CARE_PROVIDER_SITE_OTHER): Payer: Medicare Other | Admitting: Cardiology

## 2012-12-26 VITALS — BP 136/70 | HR 64 | Ht 66.0 in | Wt 165.8 lb

## 2012-12-26 DIAGNOSIS — I4891 Unspecified atrial fibrillation: Secondary | ICD-10-CM

## 2012-12-26 DIAGNOSIS — E785 Hyperlipidemia, unspecified: Secondary | ICD-10-CM

## 2012-12-26 DIAGNOSIS — I10 Essential (primary) hypertension: Secondary | ICD-10-CM

## 2012-12-26 LAB — CBC WITH DIFFERENTIAL/PLATELET
Basophils Relative: 0.7 % (ref 0.0–3.0)
Eosinophils Relative: 1.8 % (ref 0.0–5.0)
HCT: 35.8 % — ABNORMAL LOW (ref 36.0–46.0)
Hemoglobin: 12.5 g/dL (ref 12.0–15.0)
Lymphs Abs: 2.2 10*3/uL (ref 0.7–4.0)
MCV: 95.9 fl (ref 78.0–100.0)
Monocytes Absolute: 1 10*3/uL (ref 0.1–1.0)
Monocytes Relative: 13.2 % — ABNORMAL HIGH (ref 3.0–12.0)
Neutro Abs: 4.2 10*3/uL (ref 1.4–7.7)
RBC: 3.73 Mil/uL — ABNORMAL LOW (ref 3.87–5.11)
WBC: 7.6 10*3/uL (ref 4.5–10.5)

## 2012-12-26 LAB — BASIC METABOLIC PANEL
BUN: 19 mg/dL (ref 6–23)
Chloride: 99 mEq/L (ref 96–112)
GFR: 84.59 mL/min (ref >60.00)
Potassium: 3.7 mEq/L (ref 3.5–5.1)
Sodium: 137 mEq/L (ref 135–145)

## 2012-12-26 NOTE — Assessment & Plan Note (Signed)
Patient has had brief palpitations but not sustained. We will consider antiarrhythmic in the future if her symptoms worsen. Continue Cardizem and pradaxa. Check hemoglobin and renal function.

## 2012-12-26 NOTE — Assessment & Plan Note (Addendum)
Continue statin. Lipids and liver monitored by primary care. 

## 2012-12-26 NOTE — Assessment & Plan Note (Signed)
Continue present blood pressure medications. 

## 2012-12-26 NOTE — Patient Instructions (Addendum)
Your physician wants you to follow-up in: 6 MONTHS WITH DR CRENSHAW You will receive a reminder letter in the mail two months in advance. If you don't receive a letter, please call our office to schedule the follow-up appointment.   Your physician recommends that you HAVE LAB WORK TODAY 

## 2012-12-26 NOTE — Progress Notes (Signed)
HPI: [name] [name] for fu of AFib. She was evaluated in 03/2009 for chest pain. ETT-echo was done and was normal at submaximal exercise. Patient had episode of atrial fib in [place] in Oct 2012; treated with cardizem and converted spontaneously. TSH 0.901. Echo in Oct 2012 showed normal LV function. There was grade 1 diastolic dysfunction. There was mild left atrial enlargement and mild right atrial enlargement. There was mild aortic insufficiency. There was mild tricuspid regurgitation. Had [facility] Nov 2012 that showed EF 69 and normal perfusion. Patient had monitor for recurrent palpitations. This showed sinus with pacs. I last saw her in Oct of 2013. Since then, she denies dyspnea, chest pain or syncope. She has had brief episodes of palpitations for 5 minutes several times. These do not appear to be significantly problematic.   Current Outpatient Prescriptions  Medication Sig Dispense Refill  . bisoprolol (ZEBETA) 5 MG tablet TAKE ONE HALF TABLET DAILY  30 tablet  4  . chlorthalidone (HYGROTON) 25 MG tablet Take 1 tablet (25 mg total) by mouth daily.  30 tablet  11  . dabigatran (PRADAXA) 150 MG CAPS Take 1 capsule (150 mg total) by mouth every 12 (twelve) hours.  60 capsule  11  . desonide (DESOWEN) 0.05 % cream Apply topically as needed.       . diltiazem (CARDIZEM CD) 120 MG 24 hr capsule Take 1 capsule (120 mg total) by mouth daily.  30 capsule  11  . diphenhydrAMINE (BENADRYL) 25 MG tablet Take 25 mg by mouth at bedtime as needed.        . furosemide (LASIX) 20 MG tablet as needed. One by mouth daily as needed for leg swelling      . LORazepam (ATIVAN) 1 MG tablet Take 1 tablet (1 mg total) by mouth as directed. Take 1 tablet 30 minutes before flight  5 tablet  0  . losartan (COZAAR) 25 MG tablet Take 1 tablet (25 mg total) by mouth daily.  30 tablet  3  . Multiple Vitamin (MULTIVITAMINS PO) Take by mouth daily.        Marland Kitchen omeprazole (PRILOSEC) 20 MG capsule Take 1 capsule (20 mg total) by  mouth daily. Please Order In Box of #42.  42 capsule  2  . potassium chloride (K-DUR) 10 MEQ tablet Take 1 tablet (10 mEq total) by mouth 2 (two) times daily.  60 tablet  5  . pravastatin (PRAVACHOL) 40 MG tablet TAKE 1 TABLET AT BEDTIME.  30 tablet  6   No current facility-administered medications for this visit.     Past Medical History  Diagnosis Date  . Hypertension   . Hyperlipidemia   . Allergic rhinitis   . GERD (gastroesophageal reflux disease)   . Cancer of skin of leg   . History of anemia   . History of poliomyelitis     Polio  age 50- no significant neuromuscular deficit  . IBS (irritable bowel syndrome)   . Collagenous colitis   . Hyperplastic colon polyp 01/14/10  . Atrial fibrillation 06-10-2011  . Potassium deficiency   . Arthritis     Past Surgical History  Procedure Laterality Date  . Appendectomy  1944  . Breast biopsy  1947 & 10/07    Benign  . Tonsillectomy  1994  . Skin cancer excision      removal of skin cancer on left leg  . Endovenous ablation saphenous vein w/ laser  03-03-2011 left greater saphenous vein   . Endovenous ablation  saphenous vein w/ laser  01-06-2011  right greater saphenous vein    History   Social History  . Marital Status: Married    Spouse Name: N/A    Number of Children: N/A  . Years of Education: N/A   Occupational History  . Not on file.   Social History Main Topics  . Smoking status: Former Smoker -- 18 years    Types: Cigarettes    Quit date: 08/17/1987  . Smokeless tobacco: Never Used     Comment: quit in 1979  . Alcohol Use: 7.2 oz/week    12 Glasses of wine per week     Comment: glass of wine daily  . Drug Use: No  . Sexually Active: Not on file   Other Topics Concern  . Not on file   Social History Narrative   Retired Film/video editor    2 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   Married     Former Smoker quit 1989    Alcohol use-yes -  glasses of wine/day      son works for [employer] - she flies to [place] to visit her  grandchildren    ROS: arthralgias but no fevers or chills, productive cough, hemoptysis, dysphasia, odynophagia, melena, hematochezia, dysuria, hematuria, rash, seizure activity, orthopnea, PND, pedal edema, claudication. Remaining systems are negative.  Physical Exam: Well-developed well-nourished in no acute distress.  Skin is warm and dry.  HEENT is normal.  Neck is supple.  Chest is clear to auscultation with normal expansion.  Cardiovascular exam is regular rate and rhythm.  Abdominal exam nontender or distended. No masses palpated. Extremities show no edema. neuro grossly intact  ECG sinus rhythm at a rate of 64.no ST changes.

## 2013-01-31 ENCOUNTER — Other Ambulatory Visit: Payer: Self-pay | Admitting: Internal Medicine

## 2013-01-31 MED ORDER — LORAZEPAM 1 MG PO TABS: 1.0000 mg | ORAL_TABLET | ORAL | Status: DC

## 2013-01-31 NOTE — Telephone Encounter (Signed)
Refill- lorazepam 1 mg tablet. Take one tablet 30 minutes before flight. Qty 5 last fill 4.14.14

## 2013-01-31 NOTE — Telephone Encounter (Signed)
Please advise refill? Last RX was done 11-24-12 quantity 5 with 0 refills  If ok fax to [phone]

## 2013-02-06 ENCOUNTER — Telehealth: Payer: Self-pay | Admitting: Internal Medicine

## 2013-02-06 MED ORDER — OMEPRAZOLE 20 MG PO CPDR: 20.0000 mg | DELAYED_RELEASE_CAPSULE | Freq: Every day | ORAL | Status: DC

## 2013-02-06 NOTE — Telephone Encounter (Signed)
Refill-omeprazole dr 20mg  capsule. Take one capsule daily. Qty 42 last fill 5.13.14

## 2013-03-13 ENCOUNTER — Ambulatory Visit (INDEPENDENT_AMBULATORY_CARE_PROVIDER_SITE_OTHER): Payer: 59 | Admitting: Family Medicine

## 2013-03-13 ENCOUNTER — Encounter: Payer: Self-pay | Admitting: Family Medicine

## 2013-03-13 ENCOUNTER — Other Ambulatory Visit: Payer: Self-pay | Admitting: Family Medicine

## 2013-03-13 VITALS — BP 112/74 | HR 84 | Temp 98.3°F | Ht 66.0 in | Wt 163.1 lb

## 2013-03-13 DIAGNOSIS — E876 Hypokalemia: Secondary | ICD-10-CM

## 2013-03-13 DIAGNOSIS — IMO0002 Reserved for concepts with insufficient information to code with codable children: Secondary | ICD-10-CM

## 2013-03-13 DIAGNOSIS — K219 Gastro-esophageal reflux disease without esophagitis: Secondary | ICD-10-CM

## 2013-03-13 DIAGNOSIS — M179 Osteoarthritis of knee, unspecified: Secondary | ICD-10-CM

## 2013-03-13 DIAGNOSIS — E785 Hyperlipidemia, unspecified: Secondary | ICD-10-CM

## 2013-03-13 DIAGNOSIS — Z862 Personal history of diseases of the blood and blood-forming organs and certain disorders involving the immune mechanism: Secondary | ICD-10-CM

## 2013-03-13 DIAGNOSIS — D649 Anemia, unspecified: Secondary | ICD-10-CM

## 2013-03-13 DIAGNOSIS — R002 Palpitations: Secondary | ICD-10-CM

## 2013-03-13 DIAGNOSIS — I10 Essential (primary) hypertension: Secondary | ICD-10-CM

## 2013-03-13 DIAGNOSIS — I4891 Unspecified atrial fibrillation: Secondary | ICD-10-CM

## 2013-03-13 DIAGNOSIS — M1710 Unilateral primary osteoarthritis, unspecified knee: Secondary | ICD-10-CM

## 2013-03-13 LAB — CBC
HCT: 37.9 % (ref 36.0–46.0)
Hemoglobin: 12.9 g/dL (ref 12.0–15.0)
MCHC: 34 g/dL (ref 30.0–36.0)
MCV: 95.9 fL (ref 78.0–100.0)
RDW: 13.6 % (ref 11.5–15.5)

## 2013-03-13 NOTE — Assessment & Plan Note (Signed)
Well controlled at current time, no changes

## 2013-03-13 NOTE — Patient Instructions (Addendum)
Atrial Fibrillation Your caregiver has diagnosed you with atrial fibrillation (AFib). The heart normally beats very regularly; AFib is a type of irregular heartbeat. The heart rate may be faster or slower than normal. This can prevent your heart from pumping as well as it should. AFib can be constant (chronic) or intermittent (paroxysmal). CAUSES  Atrial fibrillation may be caused by:  Heart disease, including heart attack, coronary artery disease, heart failure, diseases of the heart valves, and others.  Blood clot in the lungs (pulmonary embolism).  Pneumonia or other infections.  Chronic lung disease.  Thyroid disease.  Toxins. These include alcohol, some medications (such as decongestant medications or diet pills), and caffeine. In some people, no cause for AFib can be found. This is referred to as Lone Atrial Fibrillation. SYMPTOMS   Palpitations or a fluttering in your chest.  A vague sense of chest discomfort.  Shortness of breath.  Sudden onset of lightheadedness or weakness. Sometimes, the first sign of AFib can be a complication of the condition. This could be a stroke or heart failure. DIAGNOSIS  Your description of your condition may make your caregiver suspicious of atrial fibrillation. Your caregiver will examine your pulse to determine if fibrillation is present. An EKG (electrocardiogram) will confirm the diagnosis. Further testing may help determine what caused you to have atrial fibrillation. This may include chest x-ray, echocardiogram, blood tests, or CT scans. PREVENTION  If you have previously had atrial fibrillation, your caregiver may advise you to avoid substances known to cause the condition (such as stimulant medications, and possibly caffeine or alcohol). You may be advised to use medications to prevent recurrence. Proper treatment of any underlying condition is important to help prevent recurrence. PROGNOSIS  Atrial fibrillation does tend to become a  chronic condition over time. It can cause significant complications (see below). Atrial fibrillation is not usually immediately life-threatening, but it can shorten your life expectancy. This seems to be worse in women. If you have lone atrial fibrillation and are under 60 years old, the risk of complications is very low, and life expectancy is not shortened. RISKS AND COMPLICATIONS  Complications of atrial fibrillation can include stroke, chest pain, and heart failure. Your caregiver will recommend treatments for the atrial fibrillation, as well as for any underlying conditions, to help minimize risk of complications. TREATMENT  Treatment for AFib is divided into several categories:  Treatment of any underlying condition.  Converting you out of AFib into a regular (sinus) rhythm.  Controlling rapid heart rate.  Prevention of blood clots and stroke. Medications and procedures are available to convert your atrial fibrillation to sinus rhythm. However, recent studies have shown that this may not offer you any advantage, and cardiac experts are continuing research and debate on this topic. More important is controlling your rapid heartbeat. The rapid heartbeat causes more symptoms, and places strain on your heart. Your caregiver will advise you on the use of medications that can control your heart rate. Atrial fibrillation is a strong stroke risk. You can lessen this risk by taking blood thinning medications such as Coumadin (warfarin), or sometimes aspirin. These medications need close monitoring by your caregiver. Over-medication can cause bleeding. Too little medication may not protect against stroke. HOME CARE INSTRUCTIONS   If your caregiver prescribed medicine to make your heartbeat more normally, take as directed.  If blood thinners were prescribed by your caregiver, take EXACTLY as directed.  Perform blood tests EXACTLY as directed.  Quit smoking. Smoking increases your cardiac and   lung  (pulmonary) risks.  DO NOT drink alcohol.  DO NOT drink caffeinated drinks (e.g. coffee, soda, chocolate, and leaf teas). You may drink decaffeinated coffee, soda or tea.  If you are overweight, you should choose a reduced calorie diet to lose weight. Please see a registered dietitian if you need more information about healthy weight loss. DO NOT USE DIET PILLS as they may aggravate heart problems.  If you have other heart problems that are causing AFib, you may need to eat a low salt, fat, and cholesterol diet. Your caregiver will tell you if this is necessary.  Exercise every day to improve your physical fitness. Stay active unless advised otherwise.  If your caregiver has given you a follow-up appointment, it is very important to keep that appointment. Not keeping the appointment could result in heart failure or stroke. If there is any problem keeping the appointment, you must call back to this facility for assistance. SEEK MEDICAL CARE IF:  You notice a change in the rate, rhythm or strength of your heartbeat.  You develop an infection or any other change in your overall health status. SEEK IMMEDIATE MEDICAL CARE IF:   You develop chest pain, abdominal pain, sweating, weakness or feel sick to your stomach (nausea).  You develop shortness of breath.  You develop swollen feet and ankles.  You develop dizziness, numbness, or weakness of your face or limbs, or any change in vision or speech. MAKE SURE YOU:   Understand these instructions.  Will watch your condition.  Will get help right away if you are not doing well or get worse. Document Released: 08/02/2005 Document Revised: 10/25/2011 Document Reviewed: 03/11/2010 [employer] Patient Information 2014 [employer].  

## 2013-03-14 LAB — LIPID PANEL
HDL: 60 mg/dL (ref >39)
LDL Cholesterol: 82 mg/dL (ref 0–99)

## 2013-03-14 LAB — MAGNESIUM: Magnesium: 1.8 mg/dL (ref 1.5–2.5)

## 2013-03-14 LAB — RENAL FUNCTION PANEL
BUN: 19 mg/dL (ref 6–23)
Calcium: 9.5 mg/dL (ref 8.4–10.5)
Creat: 0.6 mg/dL (ref 0.50–1.10)
Glucose, Bld: 109 mg/dL — ABNORMAL HIGH (ref 70–99)
Phosphorus: 3.6 mg/dL (ref 2.3–4.6)
Potassium: 4.1 mEq/L (ref 3.5–5.3)

## 2013-03-14 LAB — HEPATIC FUNCTION PANEL
ALT: 16 U/L (ref 0–35)
Albumin: 4.4 g/dL (ref 3.5–5.2)
Alkaline Phosphatase: 61 U/L (ref 39–117)
Indirect Bilirubin: 0.5 mg/dL (ref 0.0–0.9)
Total Protein: 6.9 g/dL (ref 6.0–8.3)

## 2013-03-14 LAB — TSH: TSH: 1.111 u[IU]/mL (ref 0.350–4.500)

## 2013-03-15 ENCOUNTER — Encounter: Payer: Self-pay | Admitting: Family Medicine

## 2013-03-15 LAB — HEMOGLOBIN A1C: Hgb A1c MFr Bld: 5.5 % (ref <5.7)

## 2013-03-15 NOTE — Progress Notes (Signed)
Patient ID: [name] [name], female   DOB: [birth year], 72 y.o.   MRN: [id number] [name] [name] [id number] [birth year] 03/15/2013      Progress Note-Follow Up  Subjective  Chief Complaint  Chief Complaint  Patient presents with  . Follow-up    9 month    HPI  Patient is a 72 year old Caucasian female who is in today for followup. No recent illness but she is complaining of some fatigue. She does occasionally have some palpitations but no associated chest pain or shortness of breath she continues to have knee pain is planning a total knee replacement on the right in the winter canal is trying to get by. Synvisc to help slightly. No fevers or chills. No congestion. Continue current meds.  Past Medical History  Diagnosis Date  . Hypertension   . Hyperlipidemia   . Allergic rhinitis   . GERD (gastroesophageal reflux disease)   . Cancer of skin of leg   . History of anemia   . History of poliomyelitis     Polio  age 6- no significant neuromuscular deficit  . IBS (irritable bowel syndrome)   . Collagenous colitis   . Hyperplastic colon polyp 01/14/10  . Atrial fibrillation 06-10-2011  . Potassium deficiency   . Arthritis     Past Surgical History  Procedure Laterality Date  . Appendectomy  1944  . Breast biopsy  1947 & 10/07    Benign  . Tonsillectomy  1994  . Skin cancer excision      removal of skin cancer on left leg  . Endovenous ablation saphenous vein w/ laser  03-03-2011 left greater saphenous vein   . Endovenous ablation saphenous vein w/ laser  01-06-2011  right greater saphenous vein    Family History  Problem Relation Age of Onset  . Alcohol abuse Father   . Heart disease Father   . Stroke Father   . Hypertension Father   . Colon cancer Maternal Grandmother   . Hypertension Sister   . Other Sister     varicose veins  . Stomach cancer Maternal Grandfather   . Heart disease Paternal Grandfather   . Peripheral vascular disease Mother   . Other Mother     varicose  veins    History   Social History  . Marital Status: Married    Spouse Name: N/A    Number of Children: N/A  . Years of Education: N/A   Occupational History  . Not on file.   Social History Main Topics  . Smoking status: Former Smoker -- 18 years    Types: Cigarettes    Quit date: 08/17/1987  . Smokeless tobacco: Never Used     Comment: quit in 1979  . Alcohol Use: 7.2 oz/week    12 Glasses of wine per week     Comment: glass of wine daily  . Drug Use: No  . Sexually Active: Not on file   Other Topics Concern  . 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 bisoprolol (ZEBETA) 5 MG tablet TAKE ONE HALF TABLET DAILY  30 tablet  4  . chlorthalidone (HYGROTON) 25 MG tablet Take 1 tablet (25 mg total) by mouth daily.  30 tablet  11  . dabigatran (PRADAXA) 150 MG CAPS Take 1 capsule (150 mg total) by mouth every 12 (twelve) hours.  60 capsule  11  . desonide (DESOWEN) 0.05 % cream Apply topically as needed.       . diltiazem (CARDIZEM CD) 120 MG 24 hr capsule Take 1 capsule (120 mg total) by mouth daily.  30 capsule  11  . diphenhydrAMINE (BENADRYL) 25 MG tablet Take 25 mg by mouth at bedtime as needed.        . furosemide (LASIX) 20 MG tablet as needed. One by mouth daily as needed for leg swelling      . LORazepam (ATIVAN) 1 MG tablet Take 1 tablet (1 mg total) by mouth as directed. Take 1 tablet 30 minutes before flight  5 tablet  0  . losartan (COZAAR) 25 MG tablet Take 1 tablet (25 mg total) by mouth daily.  30 tablet  3  . Multiple Vitamin (MULTIVITAMINS PO) Take by mouth daily.        Marland Kitchen omeprazole (PRILOSEC) 20 MG capsule Take 1 capsule (20 mg total) by mouth daily. Please Order In  Box of #42.  42 capsule  2  . potassium chloride (K-DUR) 10 MEQ tablet Take 1 tablet (10 mEq total) by mouth 2 (two) times daily.  60 tablet  5  . pravastatin (PRAVACHOL) 40 MG tablet TAKE 1 TABLET AT BEDTIME.  30 tablet  6   No current facility-administered medications on file prior to visit.    Allergies  Allergen Reactions  . Erythromycin     REACTION: Rash  . Sulfonamide Derivatives     REACTION: Hives    Review of Systems  Review of Systems  Constitutional: Positive for malaise/fatigue. Negative for fever.  HENT: Negative for congestion.   Eyes: Negative for pain and discharge.  Respiratory: Negative for shortness of breath.   Cardiovascular: Positive for palpitations. Negative for chest pain and leg swelling.  Gastrointestinal: Negative for nausea, abdominal pain and diarrhea.  Genitourinary: Negative for dysuria.  Musculoskeletal: Positive for myalgias, back pain and joint pain. Negative for falls.  Skin: Negative for rash.  Neurological: Negative for loss of consciousness and headaches.  Endo/Heme/Allergies: Negative for polydipsia.  Psychiatric/Behavioral: Negative for depression and suicidal ideas. The patient is not nervous/anxious and does not have insomnia.     Objective  BP 112/74  Pulse 84  Temp(Src) 98.3 F (36.8 C) (Oral)  Ht 5\' 6"  (1.676 m)  Wt 163 lb 1.9 oz (73.991 kg)  BMI 26.34 kg/m2  SpO2 97%  Physical Exam  Physical Exam  Constitutional: She is oriented to person, place, and time and well-developed, well-nourished, and in no distress. No distress.  HENT:  Head: Normocephalic and atraumatic.  Eyes: Conjunctivae are normal.  Neck: Neck supple. No thyromegaly present.  Cardiovascular: Normal rate and regular rhythm.  Exam reveals no gallop.   No murmur heard. Pulmonary/Chest: Effort normal and breath sounds normal. She has no wheezes.  Abdominal: She exhibits no distension and no mass.  Musculoskeletal: She exhibits no edema.   Lymphadenopathy:    She has no cervical adenopathy.  Neurological: She is alert and oriented to person, place, and time.  Skin: Skin is warm and dry. No rash noted. She is not diaphoretic.  Psychiatric: Memory, affect and judgment normal.    Lab Results  Component Value Date   TSH  1.111 03/13/2013   Lab Results  Component Value Date   WBC 8.2 03/13/2013   HGB 12.9 03/13/2013   HCT 37.9 03/13/2013   MCV 95.9 03/13/2013   PLT 293 03/13/2013   Lab Results  Component Value Date   CREATININE 0.60 03/13/2013   BUN 19 03/13/2013   NA 140 03/13/2013   K 4.1 03/13/2013   CL 100 03/13/2013   CO2 31 03/13/2013   Lab Results  Component Value Date   ALT 16 03/13/2013   AST 17 03/13/2013   ALKPHOS 61 03/13/2013   BILITOT 0.6 03/13/2013   Lab Results  Component Value Date   CHOL 177 03/13/2013   Lab Results  Component Value Date   HDL 60 03/13/2013   Lab Results  Component Value Date   LDLCALC 82 03/13/2013   Lab Results  Component Value Date   TRIG 176* 03/13/2013   Lab Results  Component Value Date   CHOLHDL 3.0 03/13/2013     Assessment & Plan  HYPERTENSION Well controlled at current time, no changes  Knee osteoarthritis Got some relief with Synvisc but is scheduled to have TKR this winter. May try Salon Pas prn  GERD Well controlled on current meds. Avoid offending foods.  Paroxysmal Atrial Fibrillation Tolerating Pradaxa, pulse improved on current meds  HYPERLIPIDEMIA Avoid trans fats, tolerating Pravastatin, increase activity as tolerated.  Hypokalemia resolved

## 2013-03-15 NOTE — Assessment & Plan Note (Signed)
Well controlled on current meds. Avoid offending foods.

## 2013-03-15 NOTE — Assessment & Plan Note (Signed)
Tolerating Pradaxa, pulse improved on current meds

## 2013-03-15 NOTE — Assessment & Plan Note (Signed)
resolved 

## 2013-03-15 NOTE — Assessment & Plan Note (Signed)
Got some relief with Synvisc but is scheduled to have TKR this winter. May try Salon Pas prn

## 2013-03-15 NOTE — Assessment & Plan Note (Signed)
Avoid trans fats, tolerating Pravastatin, increase activity as tolerated.

## 2013-03-16 ENCOUNTER — Telehealth: Payer: Self-pay | Admitting: *Deleted

## 2013-03-16 ENCOUNTER — Telehealth: Payer: Self-pay | Admitting: Cardiology

## 2013-03-16 MED ORDER — DILTIAZEM HCL ER COATED BEADS 180 MG PO CP24: 180.0000 mg | ORAL_CAPSULE | Freq: Every day | ORAL | Status: DC

## 2013-03-16 NOTE — Telephone Encounter (Signed)
Left msg on vm labs hasn't been release to my chart requesting lab results before the weekend...Raechel Chute

## 2013-03-16 NOTE — Telephone Encounter (Signed)
Please release to my chart. Her sugar looks good, triglycerides just up slightly avoid the simple carbs

## 2013-03-16 NOTE — Telephone Encounter (Signed)
New problem   Per pt she saw dr blythe and heart rate is running between 95-111

## 2013-03-16 NOTE — Telephone Encounter (Signed)
Change cardizem to 180 mg daily; dc cozaar; schedule elective OV. '[name]

## 2013-03-16 NOTE — Telephone Encounter (Signed)
Left message for pt to call.

## 2013-03-16 NOTE — Telephone Encounter (Signed)
Spoke with pt, she has thought she knew when she has atrial fib but when she was seen by dr blythe she was in atrial fib and did not know it. She has been checking her pulse since then and she has found she is in atrial fib a lot. The only symptom is she gets tried a little easier. She reports heart rates from 95 to 111. meds confirmed. She called because dr blythe told her to. Will forward for dr Jens Som review

## 2013-03-16 NOTE — Telephone Encounter (Signed)
Notified pt with md response.../lmb 

## 2013-03-16 NOTE — Telephone Encounter (Signed)
Spoke with pt, Aware of dr crenshaw's recommendations.  Follow up scheduled  

## 2013-04-09 ENCOUNTER — Other Ambulatory Visit: Payer: Self-pay | Admitting: Internal Medicine

## 2013-04-17 ENCOUNTER — Ambulatory Visit (INDEPENDENT_AMBULATORY_CARE_PROVIDER_SITE_OTHER): Payer: 59 | Admitting: Cardiology

## 2013-04-17 ENCOUNTER — Encounter: Payer: Self-pay | Admitting: Cardiology

## 2013-04-17 VITALS — BP 150/80 | HR 65 | Wt 164.0 lb

## 2013-04-17 DIAGNOSIS — I10 Essential (primary) hypertension: Secondary | ICD-10-CM

## 2013-04-17 DIAGNOSIS — I4891 Unspecified atrial fibrillation: Secondary | ICD-10-CM

## 2013-04-17 DIAGNOSIS — E785 Hyperlipidemia, unspecified: Secondary | ICD-10-CM

## 2013-04-17 NOTE — Assessment & Plan Note (Signed)
Management per primary care. 

## 2013-04-17 NOTE — Progress Notes (Signed)
HPI: Pleasant female for fu of AFib. She was evaluated in 03/2009 for chest pain. ETT-echo was done and was normal at submaximal exercise. Patient had episode of atrial fib in [place] in Oct 2012; treated with cardizem and converted spontaneously. TSH 0.901. Echo in Oct 2012 showed normal LV function. There was grade 1 diastolic dysfunction. There was mild left atrial enlargement and mild right atrial enlargement. There was mild aortic insufficiency. There was mild tricuspid regurgitation. Had [facility] Nov 2012 that showed EF 69 and normal perfusion. Patient had monitor for recurrent palpitations. This showed sinus with pacs. I last saw her in May of 2014. She was seen by primary care in late July and was told she may be in atrial fibrillation. No electrocardiogram performed. She does not have dyspnea on exertion, orthopnea, PND, pedal edema or exertional chest pain. Occasional brief palpitations. She has noticed some fatigue.   Current Outpatient Prescriptions  Medication Sig Dispense Refill  . bisoprolol (ZEBETA) 5 MG tablet TAKE ONE HALF TABLET DAILY  30 tablet  3  . chlorthalidone (HYGROTON) 25 MG tablet Take 1 tablet (25 mg total) by mouth daily.  30 tablet  11  . dabigatran (PRADAXA) 150 MG CAPS Take 1 capsule (150 mg total) by mouth every 12 (twelve) hours.  60 capsule  11  . diltiazem (CARDIZEM CD) 180 MG 24 hr capsule Take 1 capsule (180 mg total) by mouth daily.  30 capsule  11  . diphenhydrAMINE (BENADRYL) 25 MG tablet Take 25 mg by mouth at bedtime as needed.        Marland Kitchen LORazepam (ATIVAN) 1 MG tablet Take 1 tablet (1 mg total) by mouth as directed. Take 1 tablet 30 minutes before flight  5 tablet  0  . Multiple Vitamin (MULTIVITAMINS PO) Take by mouth every other day.       Marland Kitchen omeprazole (PRILOSEC) 20 MG capsule Take 1 capsule (20 mg total) by mouth daily. Please Order In Box of #42.  42 capsule  2  . potassium chloride (K-DUR) 10 MEQ tablet Take 1 tablet (10 mEq total) by mouth 2 (two) times  daily.  60 tablet  5  . pravastatin (PRAVACHOL) 40 MG tablet TAKE 1 TABLET AT BEDTIME.  30 tablet  6   No current facility-administered medications for this visit.     Past Medical History  Diagnosis Date  . Hypertension   . Hyperlipidemia   . Allergic rhinitis   . GERD (gastroesophageal reflux disease)   . Cancer of skin of leg   . History of anemia   . History of poliomyelitis     Polio  age 24- no significant neuromuscular deficit  . IBS (irritable bowel syndrome)   . Collagenous colitis   . Hyperplastic colon polyp 01/14/10  . Atrial fibrillation 06-10-2011  . Potassium deficiency   . Arthritis     Past Surgical History  Procedure Laterality Date  . Appendectomy  1944  . Breast biopsy  1947 & 10/07    Benign  . Tonsillectomy  1994  . Skin cancer excision      removal of skin cancer on left leg  . Endovenous ablation saphenous vein w/ laser  03-03-2011 left greater saphenous vein   . Endovenous ablation saphenous vein w/ laser  01-06-2011  right greater saphenous vein    History   Social History  . Marital Status: Married    Spouse Name: N/A    Number of Children: N/A  . Years of Education: N/A  Occupational History  . Not on file.   Social History Main Topics  . Smoking status: Former Smoker -- 18 years    Types: Cigarettes    Quit date: 08/17/1987  . Smokeless tobacco: Never Used     Comment: quit in 1979  . Alcohol Use: 7.2 oz/week    12 Glasses of wine per week     Comment: glass of wine daily  . Drug Use: No  . Sexual Activity: Not on file   Other Topics Concern  . 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 Remaining systems are negative.  Physical Exam: Well-developed well-nourished in no acute distress.  Skin is warm and dry.  HEENT is normal.  Neck is supple.  Chest is clear to auscultation with normal expansion.  Cardiovascular exam is regular rate and rhythm.  Abdominal exam nontender or distended. No masses palpated. Extremities show no edema. neuro grossly intact  ECG sinus rhythm at a rate of 65. No ST changes.

## 2013-04-17 NOTE — Assessment & Plan Note (Signed)
Patient remains in sinus rhythm today. There was questionable atrial fibrillation at primary care office visit but not documented by ECG or rhythm strip. She is having some fatigue that she questions if related to atrial fibrillation although she has some fatigue today and is in sinus rhythm. I am not convinced that she is particular asymptomatic. Plan to continue rate control and anti-coagulation at this point. If she has symptomatic episodes in the future that are frequent we will consider antiarrhythmic.

## 2013-04-17 NOTE — Assessment & Plan Note (Signed)
Blood pressure is mildly elevated but she states typically controlled. Continue present medications.

## 2013-04-17 NOTE — Patient Instructions (Addendum)
Your physician wants you to follow-up in: 6 MONTHS WITH DR CRENSHAW You will receive a reminder letter in the mail two months in advance. If you don't receive a letter, please call our office to schedule the follow-up appointment.  

## 2013-04-18 ENCOUNTER — Telehealth: Payer: Self-pay | Admitting: Cardiology

## 2013-04-18 MED ORDER — BISOPROLOL FUMARATE 5 MG PO TABS: ORAL_TABLET | ORAL | Status: DC

## 2013-04-18 NOTE — Telephone Encounter (Signed)
DC HCTZ and take bisoprolol only; continue chorthalidone. [name]

## 2013-04-18 NOTE — Telephone Encounter (Signed)
New Problem  Pt  has concerns about the Bisoprolol containing HZTZ// request a call back to discuss.

## 2013-04-18 NOTE — Telephone Encounter (Signed)
Spoke with pt, Aware of dr crenshaw's recommendations.  °

## 2013-04-18 NOTE — Telephone Encounter (Signed)
Spoke with pt, she called to confirm she is taking bisoprolol HCTZ 5-6.25 mg 1/2 tablet daily. She is also on chlorthalidone and wants to make sure she needs to take both. Will forward for dr Jens Som review

## 2013-06-06 ENCOUNTER — Other Ambulatory Visit: Payer: Self-pay

## 2013-06-06 MED ORDER — POTASSIUM CHLORIDE ER 10 MEQ PO TBCR: 10.0000 meq | EXTENDED_RELEASE_TABLET | Freq: Two times a day (BID) | ORAL | Status: DC

## 2013-06-13 ENCOUNTER — Other Ambulatory Visit: Payer: Self-pay

## 2013-06-13 ENCOUNTER — Other Ambulatory Visit: Payer: Self-pay | Admitting: *Deleted

## 2013-06-13 MED ORDER — DABIGATRAN ETEXILATE MESYLATE 150 MG PO CAPS: 150.0000 mg | ORAL_CAPSULE | Freq: Two times a day (BID) | ORAL | Status: DC

## 2013-06-13 MED ORDER — OMEPRAZOLE 20 MG PO CPDR: 20.0000 mg | DELAYED_RELEASE_CAPSULE | Freq: Every day | ORAL | Status: DC

## 2013-06-13 NOTE — Telephone Encounter (Signed)
Rx request to pharmacy/SLS  

## 2013-06-21 ENCOUNTER — Other Ambulatory Visit: Payer: Self-pay

## 2013-06-26 ENCOUNTER — Other Ambulatory Visit: Payer: Self-pay | Admitting: Ophthalmology

## 2013-06-26 DIAGNOSIS — H469 Unspecified optic neuritis: Secondary | ICD-10-CM

## 2013-06-29 ENCOUNTER — Other Ambulatory Visit: Payer: Self-pay | Admitting: Ophthalmology

## 2013-06-29 DIAGNOSIS — H5709 Other anomalies of pupillary function: Secondary | ICD-10-CM

## 2013-07-05 ENCOUNTER — Other Ambulatory Visit: Payer: 59

## 2013-07-09 ENCOUNTER — Telehealth: Payer: Self-pay | Admitting: Cardiology

## 2013-07-09 NOTE — Telephone Encounter (Signed)
Walk in Pt Form "[facility]" Clearance gave to [name] [name] 07/09/13/KM

## 2013-07-10 ENCOUNTER — Ambulatory Visit
Admission: RE | Admit: 2013-07-10 | Discharge: 2013-07-10 | Disposition: A | Discharge status: Home / Self Care | Payer: Medicare PPO | Source: Ambulatory Visit | Attending: Ophthalmology | Admitting: Ophthalmology

## 2013-07-10 ENCOUNTER — Ambulatory Visit
Admission: RE | Admit: 2013-07-10 | Discharge: 2013-07-10 | Disposition: A | Discharge status: Home / Self Care | Payer: 59 | Source: Ambulatory Visit | Attending: Ophthalmology | Admitting: Ophthalmology

## 2013-07-10 ENCOUNTER — Telehealth: Payer: Self-pay | Admitting: Cardiology

## 2013-07-10 DIAGNOSIS — H5709 Other anomalies of pupillary function: Secondary | ICD-10-CM

## 2013-07-10 NOTE — Telephone Encounter (Signed)
Received request from Nurse fax box, documents faxed for surgical clearance. To: [facility]  Fax number: [phone] Attention: 07/10/13/KM

## 2013-07-13 ENCOUNTER — Other Ambulatory Visit: Payer: Self-pay

## 2013-07-13 MED ORDER — CHLORTHALIDONE 25 MG PO TABS: 25.0000 mg | ORAL_TABLET | Freq: Every day | ORAL | Status: DC

## 2013-07-24 ENCOUNTER — Ambulatory Visit (INDEPENDENT_AMBULATORY_CARE_PROVIDER_SITE_OTHER): Payer: 59 | Admitting: Family

## 2013-07-24 ENCOUNTER — Encounter: Payer: Self-pay | Admitting: Family

## 2013-07-24 ENCOUNTER — Ambulatory Visit (HOSPITAL_BASED_OUTPATIENT_CLINIC_OR_DEPARTMENT_OTHER)
Admission: RE | Admit: 2013-07-24 | Discharge: 2013-07-24 | Disposition: A | Discharge status: Home / Self Care | Payer: Medicare PPO | Source: Ambulatory Visit | Attending: Family | Admitting: Family

## 2013-07-24 VITALS — BP 120/84 | HR 83 | Temp 97.9°F | Resp 16 | Ht 66.0 in | Wt 164.1 lb

## 2013-07-24 DIAGNOSIS — E785 Hyperlipidemia, unspecified: Secondary | ICD-10-CM

## 2013-07-24 DIAGNOSIS — I4891 Unspecified atrial fibrillation: Secondary | ICD-10-CM | POA: Insufficient documentation

## 2013-07-24 DIAGNOSIS — IMO0002 Reserved for concepts with insufficient information to code with codable children: Secondary | ICD-10-CM

## 2013-07-24 DIAGNOSIS — M1710 Unilateral primary osteoarthritis, unspecified knee: Secondary | ICD-10-CM

## 2013-07-24 DIAGNOSIS — Z23 Encounter for immunization: Secondary | ICD-10-CM

## 2013-07-24 DIAGNOSIS — Z01818 Encounter for other preprocedural examination: Secondary | ICD-10-CM

## 2013-07-24 DIAGNOSIS — H5702 Anisocoria: Secondary | ICD-10-CM

## 2013-07-24 LAB — CBC WITH DIFFERENTIAL/PLATELET
Eosinophils Relative: 2 % (ref 0–5)
HCT: 38.8 % (ref 36.0–46.0)
Hemoglobin: 13.3 g/dL (ref 12.0–15.0)
Lymphocytes Relative: 23 % (ref 12–46)
MCHC: 34.3 g/dL (ref 30.0–36.0)
MCV: 93.9 fL (ref 78.0–100.0)
Monocytes Absolute: 0.7 10*3/uL (ref 0.1–1.0)
Monocytes Relative: 9 % (ref 3–12)
Neutro Abs: 4.9 10*3/uL (ref 1.7–7.7)
Platelets: 314 10*3/uL (ref 150–400)
RDW: 13.2 % (ref 11.5–15.5)
WBC: 7.5 10*3/uL (ref 4.0–10.5)

## 2013-07-24 LAB — BASIC METABOLIC PANEL WITH GFR
BUN: 20 mg/dL (ref 6–23)
CO2: 31 mEq/L (ref 19–32)
Chloride: 98 mEq/L (ref 96–112)
Creat: 0.72 mg/dL (ref 0.50–1.10)
GFR, Est Non African American: 84 mL/min
Potassium: 4.3 mEq/L (ref 3.5–5.3)

## 2013-07-24 LAB — HEPATIC FUNCTION PANEL
ALT: 18 U/L (ref 0–35)
AST: 20 U/L (ref 0–37)
Bilirubin, Direct: 0.1 mg/dL (ref 0.0–0.3)
Indirect Bilirubin: 0.5 mg/dL (ref 0.0–0.9)
Total Bilirubin: 0.6 mg/dL (ref 0.3–1.2)
Total Protein: 7.3 g/dL (ref 6.0–8.3)

## 2013-07-24 LAB — PROTIME-INR: INR: 1.42 (ref <1.50)

## 2013-07-24 NOTE — Progress Notes (Signed)
Pre visit review using our clinic review tool, if applicable. No additional management support is needed unless otherwise documented below in the visit note. 

## 2013-07-24 NOTE — Progress Notes (Signed)
Subjective:    Patient ID: [name] [name], female    DOB: 05/19/1941, 72 y.o.   MRN: [id number]  HPI  [name] [name] is a 72 yr old female who presents today for pre-operative evaluation for R TKR which will be performed by Dr. Norlene Campbell. A date has not yet been set for this.   Hyperlipidemia-  On pravastatin- reports increasing LE weakness. She continues co-enzyme Q10.  PAF- she is maintained on pradaxa.  She reports that Dr. Vonna Kotyk noticed on 6 month follow up that her left pupil does not react normally to light.  Was told that MRI of eye and brain normal.  Per pt he also ran inflammatory markers which were reportedly normal.     Review of Systems  Constitutional: Negative for unexpected weight change.  HENT: Negative for rhinorrhea.   Eyes: Negative for visual disturbance.  Respiratory: Positive for cough. Negative for shortness of breath.        Chronic AM cough. Attributes to nasal drainage  Cardiovascular: Negative for chest pain and leg swelling.  Gastrointestinal: Negative for nausea, vomiting and abdominal pain.       Hx of collagenous colitis. Reports stable  Genitourinary: Negative for dysuria and frequency.  Musculoskeletal: Positive for arthralgias.  Skin: Negative for rash.  Hematological: Negative for adenopathy.  Psychiatric/Behavioral:       Denies depression/anxiety. Though worried about son who has colorectal cancer   See HPI  Past Medical History  Diagnosis Date  . Hypertension   . Hyperlipidemia   . Allergic rhinitis   . GERD (gastroesophageal reflux disease)   . Cancer of skin of leg   . History of anemia   . History of poliomyelitis     Polio  age 63- no significant neuromuscular deficit  . IBS (irritable bowel syndrome)   . Collagenous colitis   . Hyperplastic colon polyp 01/14/10  . Atrial fibrillation 06-10-2011  . Potassium deficiency   . Arthritis     History   Social History  . Marital Status: Married    Spouse Name: N/A    Number of  Children: N/A  . Years of Education: N/A   Occupational History  . Not on file.   Social History Main Topics  . Smoking status: Former Smoker -- 18 years    Types: Cigarettes    Quit date: 08/17/1987  . Smokeless tobacco: Never Used     Comment: quit in 1979  . Alcohol Use: 7.2 oz/week    12 Glasses of wine per week     Comment: glass of wine daily  . Drug Use: No  . Sexual Activity: Not on file   Other Topics Concern  . 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 Appendectomy  1944  . Breast biopsy  1947 & 10/07    Benign  . Tonsillectomy  1994  . Skin cancer excision      removal of skin cancer on left leg  . Endovenous ablation saphenous vein w/ laser  03-03-2011 left greater saphenous vein   . Endovenous ablation saphenous vein w/ laser  01-06-2011  right greater saphenous  vein    Family History  Problem Relation Age of Onset  . Alcohol abuse Father   . Heart disease Father   . Stroke Father   . Hypertension Father   . Colon cancer Maternal Grandmother   . Hypertension Sister   . Other Sister     varicose veins  . Stomach cancer Maternal Grandfather   . Heart disease Paternal Grandfather   . Peripheral vascular disease Mother   . Other Mother     varicose veins    Allergies  Allergen Reactions  . Erythromycin     REACTION: Rash  . Sulfonamide Derivatives     REACTION: Hives    Current Outpatient Prescriptions on File Prior to Visit  Medication Sig Dispense Refill  . bisoprolol (ZEBETA) 5 MG tablet One half tablet once daily  30 tablet  3  . chlorthalidone (HYGROTON) 25 MG tablet Take 1 tablet (25 mg total) by mouth daily.  30 tablet  6  . dabigatran (PRADAXA) 150 MG CAPS capsule Take 1 capsule  (150 mg total) by mouth every 12 (twelve) hours.  60 capsule  6  . diltiazem (CARDIZEM CD) 180 MG 24 hr capsule Take 1 capsule (180 mg total) by mouth daily.  30 capsule  11  . diphenhydrAMINE (BENADRYL) 25 MG tablet Take 25 mg by mouth at bedtime as needed.        Marland Kitchen LORazepam (ATIVAN) 1 MG tablet Take 1 tablet (1 mg total) by mouth as directed. Take 1 tablet 30 minutes before flight  5 tablet  0  . Multiple Vitamin (MULTIVITAMINS PO) Take by mouth every other day.       . potassium chloride (K-DUR) 10 MEQ tablet Take 1 tablet (10 mEq total) by mouth 2 (two) times daily.  60 tablet  5  . pravastatin (PRAVACHOL) 40 MG tablet TAKE 1 TABLET AT BEDTIME.  30 tablet  6   No current facility-administered medications on file prior to visit.    BP 120/84  Pulse 83  Temp(Src) 97.9 F (36.6 C) (Oral)  Resp 16  Ht 5\' 6"  (1.676 m)  Wt 164 lb 1.3 oz (74.426 kg)  BMI 26.50 kg/m2  SpO2 97%       Objective:   Physical Exam  Constitutional: She is oriented to person, place, and time. She appears well-developed and well-nourished. No distress.  HENT:  Head: Normocephalic and atraumatic.  L pupil very mildly sluggish to contrict  Eyes: No scleral icterus.  Cardiovascular: Normal rate and regular rhythm.   No murmur heard. Pulmonary/Chest: Effort normal and breath sounds normal. No respiratory distress. She has no wheezes. She has no rales. She exhibits no tenderness.  Abdominal: Soft. Bowel sounds are normal.  Musculoskeletal: She exhibits no edema.  Lymphadenopathy:    She has no cervical adenopathy.  Neurological: She is alert and oriented to person, place, and time.  Skin: Skin is warm and dry.  Psychiatric: She has a normal mood and affect. Her behavior is normal. Judgment and thought content normal.          Assessment & Plan:

## 2013-07-25 ENCOUNTER — Telehealth: Payer: Self-pay | Admitting: Family

## 2013-07-25 LAB — URINALYSIS, ROUTINE W REFLEX MICROSCOPIC
Bilirubin Urine: NEGATIVE
Glucose, UA: NEGATIVE mg/dL
Hgb urine dipstick: NEGATIVE
Ketones, ur: NEGATIVE mg/dL
Protein, ur: NEGATIVE mg/dL
Urobilinogen, UA: 1 mg/dL (ref 0.0–1.0)
pH: 7.5 (ref 5.0–8.0)

## 2013-07-25 NOTE — Telephone Encounter (Signed)
Message copied by Sandford Craze on Wed Jul 25, 2013  7:19 AM ------      Message from: Lewayne Bunting      Created: Tue Jul 24, 2013 12:48 PM       Typically hold 2 days prior to procedure      [name]            ----- Message -----         From: Sandford Craze, NP         Sent: 07/24/2013  11:48 AM           To: Lewayne Bunting, MD            How may days are you comfortable holding her pradaxa pre-operatively?            Thanks,            Maxwell Martorano       ------

## 2013-07-26 ENCOUNTER — Telehealth: Payer: Self-pay | Admitting: Family

## 2013-07-26 ENCOUNTER — Encounter: Payer: Self-pay | Admitting: Family

## 2013-07-26 DIAGNOSIS — H5702 Anisocoria: Secondary | ICD-10-CM | POA: Insufficient documentation

## 2013-07-26 LAB — URINE CULTURE: Colony Count: NO GROWTH

## 2013-07-26 NOTE — Assessment & Plan Note (Signed)
Had borderline elevation of cholesterol in the past, not tolerating statin. D/C for now. Plan to recheck FLP postoperatively.

## 2013-07-26 NOTE — Telephone Encounter (Signed)
Labs, x ray, urine are all good.  It should not be any problem clearing her for surgery, however I did notice that we do not have ekg since September.  I would like her to complete EKG prior to her upcoming surgery. Could she please book nurse visit for ekg?

## 2013-07-26 NOTE — Assessment & Plan Note (Signed)
Has had negative work up per opthalmology.

## 2013-07-26 NOTE — Assessment & Plan Note (Signed)
Labs, CXR, urine all look good.  Need pt to complete more recent EKG, will arrange nurse visit.

## 2013-07-26 NOTE — Assessment & Plan Note (Signed)
Rate stable.  Asymptomatic.  Hold pradaxa x 48 hrs prior to procedure per cardiology recommendation.

## 2013-07-27 NOTE — Telephone Encounter (Signed)
Notified pt. She states she lives 50 miles from our office and it is very difficult to drive this far at this time. Wants to know if she can have EKG at Dr Ludwig Clarks office as it is only 36 miles from her house?  Please advise.

## 2013-07-28 NOTE — Telephone Encounter (Signed)
Please arrange ecg [name]

## 2013-07-28 NOTE — Telephone Encounter (Signed)
Dr. Jens Som- this pt is requesting to have pre-op EKG done at your office on [address] due to logistics. Would it be possible to arrange a nurse visit for EKG please?  I don't expect she will need formal cardiac clearance.

## 2013-07-30 ENCOUNTER — Encounter: Payer: Self-pay | Admitting: *Deleted

## 2013-07-30 NOTE — Telephone Encounter (Signed)
Left message for pt to call.

## 2013-08-01 NOTE — Telephone Encounter (Signed)
Spoke with pt, nurse room EKG scheduled.

## 2013-08-07 ENCOUNTER — Ambulatory Visit (INDEPENDENT_AMBULATORY_CARE_PROVIDER_SITE_OTHER): Payer: Medicare PPO | Admitting: *Deleted

## 2013-08-07 VITALS — BP 142/82 | HR 69 | Wt 168.0 lb

## 2013-08-07 DIAGNOSIS — I4891 Unspecified atrial fibrillation: Secondary | ICD-10-CM

## 2013-08-07 DIAGNOSIS — Z01818 Encounter for other preprocedural examination: Secondary | ICD-10-CM

## 2013-08-07 NOTE — Progress Notes (Signed)
Pt here for EKG as requested by Sandford Craze, NP.  Pt reports she is feeling well. EKG done and reviewed by Dr. Excell Seltzer. Will have medical records scan in to the attention of Sandford Craze, NP.

## 2013-08-08 ENCOUNTER — Telehealth: Payer: Self-pay | Admitting: Family

## 2013-08-08 NOTE — Telephone Encounter (Signed)
Below info faxed to [phone].

## 2013-08-08 NOTE — Telephone Encounter (Signed)
Please fax surgical clearance to Dr. Norlene Campbell along with office note, ekg, labs. Thanks.

## 2013-08-24 ENCOUNTER — Telehealth: Payer: Self-pay | Admitting: Gastroenterology

## 2013-08-24 ENCOUNTER — Telehealth: Payer: Self-pay | Admitting: Internal Medicine

## 2013-08-24 NOTE — Telephone Encounter (Signed)
Patient has a hx of collangenous colitis. She is reporting diarrhea all week. Yesterday, she had 3 diarrhea stools. She took Imodium. Last night, she was up 3 times with diarrhea with mucous in it and she has already gone again this AM. She is out of town ([place], [place]). She is scheduled for knee surgery on 09/18/13. She is asking for Cipro rx. States she will be back in town on Monday and will come in for OV next week if needed. Please, advise.

## 2013-08-24 NOTE — Telephone Encounter (Signed)
She had not heard back from the office today.  Called on call tonight.  HAving a flare of nonbloody diarrhea.  On vacation.  I called in entocort prescription9mg  once daily, one month with 1 refill.  She will call the office next week to update on symptoms.

## 2013-08-25 NOTE — Telephone Encounter (Signed)
Hx of  Microscopic colitis, help[ed with Entecort taper and Welchol 600 mg bid. Dr Ardis Hughs sent [name] [name]. She needs to be seen when she comes back from her trip.

## 2013-08-27 NOTE — Telephone Encounter (Signed)
Spoke with patient and scheduled her on 08/30/12 at 2:00 PM with Tye Savoy, NP.

## 2013-08-27 NOTE — Telephone Encounter (Signed)
Left a message for patient to call me. 

## 2013-08-30 ENCOUNTER — Ambulatory Visit (INDEPENDENT_AMBULATORY_CARE_PROVIDER_SITE_OTHER): Payer: Medicare PPO | Admitting: Nurse Practitioner

## 2013-08-30 ENCOUNTER — Encounter: Payer: Self-pay | Admitting: Nurse Practitioner

## 2013-08-30 VITALS — BP 148/78 | HR 80 | Ht 66.0 in | Wt 167.8 lb

## 2013-08-30 DIAGNOSIS — R197 Diarrhea, unspecified: Secondary | ICD-10-CM

## 2013-08-30 NOTE — Progress Notes (Signed)
reviewed and agree. 

## 2013-08-30 NOTE — Progress Notes (Signed)
     History of Present Illness:  Patient is a 73 year old female known to Dr. Olevia Perches for a history of recurrent collagenous colitis. Patient has not been seen 2012. She had been doing fairly well from a gastrointestinal standpoint until several days ago when she developed watery stools. Patient spoke to our on-call physician 6 days ago. She was started on Entocort which has worked well in the past. We apparently prescribed one TID but patient has only been taking one BID. After 3rd day of medication her diarrhea was already subsiding. Patient is due for knee surgery early next month and is concerned about being on Entocort while trying to heal her knee.   Current Medications, Allergies, Past Medical History, Past Surgical History, Family History and Social History were reviewed in [employer] record.  Physical Exam: General: Pleasant, well developed , white female in no acute distress Head: Normocephalic and atraumatic Eyes:  sclerae anicteric, conjunctiva pink  Ears: Normal auditory acuity Lungs: Clear throughout to auscultation Heart: Regular rate and rhythm Abdomen: Soft, non distended, non-tender. No masses, no hepatomegaly. Normal bowel sounds Musculoskeletal: Symmetrical with no gross deformities  Extremities: No edema  Neurological: Alert oriented x 4, grossly nonfocal Psychological:  Alert and cooperative. Normal mood and affect  Assessment and Recommendations:  73 year old female with recurrent diarrhea, reminiscent of collagenous colitis. Patient greatly improved after only 6 days of Entocort (at 6mg  daily). This may of been a flare of collagenous colitis versus infectious etiology. At any rate, she has greatly improved. Patient is to have knee surgery early next month, she is concerned that Entocort may interfere with healing process. Though Entocort has very limited systemic absorption I understand her concerns. Patient will take taper Entocort over next two  weeks as follows:  6mg  x 7 days followed by 3mg  x7 days then discontinue.  Patient will call us if she relapses.

## 2013-09-03 ENCOUNTER — Encounter (HOSPITAL_COMMUNITY): Payer: Self-pay | Admitting: Pharmacy Technician

## 2013-09-05 ENCOUNTER — Encounter (HOSPITAL_COMMUNITY): Payer: Self-pay

## 2013-09-05 ENCOUNTER — Encounter (HOSPITAL_COMMUNITY)
Admission: RE | Admit: 2013-09-05 | Discharge: 2013-09-05 | Disposition: A | Discharge status: Home / Self Care | Payer: Medicare PPO | Source: Ambulatory Visit | Attending: Orthopaedic Surgery | Admitting: Orthopaedic Surgery

## 2013-09-05 DIAGNOSIS — Z01818 Encounter for other preprocedural examination: Secondary | ICD-10-CM | POA: Insufficient documentation

## 2013-09-05 DIAGNOSIS — Z01812 Encounter for preprocedural laboratory examination: Secondary | ICD-10-CM | POA: Insufficient documentation

## 2013-09-05 HISTORY — DX: Cough: R05

## 2013-09-05 HISTORY — DX: Cough, unspecified: R05.9

## 2013-09-05 HISTORY — DX: Anemia, unspecified: D64.9

## 2013-09-05 LAB — URINALYSIS, ROUTINE W REFLEX MICROSCOPIC
BILIRUBIN URINE: NEGATIVE
Glucose, UA: NEGATIVE mg/dL
HGB URINE DIPSTICK: NEGATIVE
Ketones, ur: NEGATIVE mg/dL
Leukocytes, UA: NEGATIVE
Nitrite: NEGATIVE
PH: 8 (ref 5.0–8.0)
Protein, ur: NEGATIVE mg/dL
SPECIFIC GRAVITY, URINE: 1.011 (ref 1.005–1.030)
Urobilinogen, UA: 1 mg/dL (ref 0.0–1.0)

## 2013-09-05 LAB — CBC WITH DIFFERENTIAL/PLATELET
Basophils Absolute: 0 10*3/uL (ref 0.0–0.1)
Basophils Relative: 0 % (ref 0–1)
Eosinophils Absolute: 0.1 10*3/uL (ref 0.0–0.7)
Eosinophils Relative: 1 % (ref 0–5)
HCT: 40.8 % (ref 36.0–46.0)
HEMOGLOBIN: 13.9 g/dL (ref 12.0–15.0)
LYMPHS ABS: 2.8 10*3/uL (ref 0.7–4.0)
Lymphocytes Relative: 28 % (ref 12–46)
MCH: 33.6 pg (ref 26.0–34.0)
MCHC: 34.1 g/dL (ref 30.0–36.0)
MCV: 98.6 fL (ref 78.0–100.0)
Monocytes Absolute: 0.9 10*3/uL (ref 0.1–1.0)
Monocytes Relative: 9 % (ref 3–12)
NEUTROS ABS: 6.3 10*3/uL (ref 1.7–7.7)
NEUTROS PCT: 62 % (ref 43–77)
Platelets: 267 10*3/uL (ref 150–400)
RBC: 4.14 MIL/uL (ref 3.87–5.11)
RDW: 13.1 % (ref 11.5–15.5)
WBC: 10.1 10*3/uL (ref 4.0–10.5)

## 2013-09-05 LAB — PROTIME-INR
INR: 1.44 (ref 0.00–1.49)
PROTHROMBIN TIME: 17.2 s — AB (ref 11.6–15.2)

## 2013-09-05 LAB — TYPE AND SCREEN
ABO/RH(D): O NEG
Antibody Screen: NEGATIVE

## 2013-09-05 LAB — COMPREHENSIVE METABOLIC PANEL
ALK PHOS: 58 U/L (ref 39–117)
ALT: 18 U/L (ref 0–35)
AST: 17 U/L (ref 0–37)
Albumin: 4.2 g/dL (ref 3.5–5.2)
BILIRUBIN TOTAL: 0.5 mg/dL (ref 0.3–1.2)
BUN: 20 mg/dL (ref 6–23)
CHLORIDE: 96 meq/L (ref 96–112)
CO2: 30 mEq/L (ref 19–32)
Calcium: 9.4 mg/dL (ref 8.4–10.5)
Creatinine, Ser: 0.71 mg/dL (ref 0.50–1.10)
GFR calc non Af Amer: 84 mL/min — ABNORMAL LOW (ref >90)
GLUCOSE: 91 mg/dL (ref 70–99)
POTASSIUM: 3.9 meq/L (ref 3.7–5.3)
Sodium: 140 mEq/L (ref 137–147)
Total Protein: 7.6 g/dL (ref 6.0–8.3)

## 2013-09-05 LAB — SURGICAL PCR SCREEN
MRSA, PCR: NEGATIVE
Staphylococcus aureus: NEGATIVE

## 2013-09-05 LAB — ABO/RH: ABO/RH(D): O NEG

## 2013-09-05 LAB — APTT: aPTT: 65 seconds — ABNORMAL HIGH (ref 24–37)

## 2013-09-05 MED ORDER — CHLORHEXIDINE GLUCONATE 4 % EX LIQD
60.0000 mL | Freq: Every day | CUTANEOUS | Status: DC
Start: 2013-09-05 — End: 2013-09-06

## 2013-09-05 MED ORDER — CHLORHEXIDINE GLUCONATE 4 % EX LIQD: 60.0000 mL | Freq: Once | CUTANEOUS | Status: DC

## 2013-09-05 NOTE — Pre-Procedure Instructions (Signed)
[name] [name]  09/05/2013   Your procedure is scheduled on:  Tues, Feb 3 @ 7:30 AM  Report to [facility]  at 5:30 AM.  Call this number if you have problems the morning of surgery: [phone]   Remember:   Do not eat food or drink liquids after midnight.   Take these medicines the morning of surgery with A SIP OF WATER: Bisoprolol(Zebeta),Budesonide(Entocort-if needed),Diltiazem(Cardizem),and Omeprazole(Prilosec)               Stop taking your Pradaxa and Co Q10. No Goody's,BC's,Aleve,Aspirin,Ibuprofen,or any Herbal Medications    Do not wear jewelry, make-up or nail polish.  Do not wear lotions, powders, or perfumes. You may wear deodorant.  Do not shave 48 hours prior to surgery.   Do not bring valuables to the hospital.  [facility] is not responsible                  for any belongings or valuables.               Contacts, dentures or bridgework may not be worn into surgery.  Leave suitcase in the car. After surgery it may be brought to your room.  For patients admitted to the hospital, discharge time is determined by your                treatment team.                  Special Instructions: Shower using CHG 2 nights before surgery and the night before surgery.  If you shower the day of surgery use CHG.  Use special wash - you have one bottle of CHG for all showers.  You should use approximately 1/3 of the bottle for each shower.   Please read over the following fact sheets that you were given: Pain Booklet, Coughing and Deep Breathing, Blood Transfusion Information, MRSA Information and Surgical Site Infection Prevention

## 2013-09-05 NOTE — Progress Notes (Addendum)
Cardiologist is Dr.Crenshaw with last office visit in epic from 04-2013  Echo reports in epic from 2010 and 2012  Stress test report in epic from 2012  EKG in epic from 08-07-13  CXR in epic from 07-24-13  Medical Md is Dr.Stacey Charlett Blake  Denies ever having a heart cath

## 2013-09-06 LAB — URINE CULTURE

## 2013-09-07 ENCOUNTER — Telehealth: Payer: Self-pay | Admitting: Family Medicine

## 2013-09-07 MED ORDER — OMEPRAZOLE 20 MG PO CPDR: 20.0000 mg | DELAYED_RELEASE_CAPSULE | Freq: Every day | ORAL | Status: DC

## 2013-09-07 NOTE — H&P (Signed)
CHIEF COMPLAINT:  Painful right knee.    HISTORY OF PRESENT ILLNESS:  [name] is a 73 year old white female who is seen today for evaluation of her right knee.  She has had chronic problems with her right knee and this dates back to almost a year ago in February when she was seen with a 2-week history of pain along the medial aspect of her knee.  She started having pain and discomfort, because she had to clean a house at that time and started having some sensation of pain along the medial aspect of the knee and started having a limping episode.  She tried conservative treatment, but this did not work.  At that time she had been noted radiographically to have multiple loose bodies present as well as spurring.  She also had patellofemoral OA.  She did have a corticosteroid injection at that time and did not have long lasting from the corticosteroid and so we felt that possibly re-injection was indicated.  She did have a corticosteroid injection then in April 2014 to the right knee and because of her Pradaxa we did call her cardiologist, but apparently there was not an appropriate medication to use with Pradaxa.  We did start Hyalgan injections in May 2014.  She did have continued pain with activities even after her viscosupplementation.  It got to the point where she could not walk, exercise or actually do her activities of daily living.  She is also complaining of swelling in the knee.  She also has some sensation as if there are things floating in her knee.  She has nighttime pain as well as pain with activities of daily living.  Her symptoms have worsened to the point now where she returns for re-evaluation.     PAST MEDICAL HISTORY:  In general, her health is fair.    PAST SURGICAL HISTORY:   1.  In 1944 a tonsillectomy.  2.  In 1947 appendectomy.  3.  She was hospitalized in 1956 for polio.   4.  She has had 2 children, one in 1968 and one in [place].   5.  In October 2012 she was noted to have  atrial fibrillation.     CURRENT MEDICATIONS:   1.  Zebeta 5 mg with chlorthalidone 25 mg.  She takes 1/2 tablet of the Zebeta and 1/2 tablet of the [place] or the chlorthalidone a day.   2.  Pradaxa 150 mg q.12h. 3.  Cardizem CD 120 mg 24 hour capsule once daily. 4.  Benadryl 25 mg at bedtime. 5.  Multivitamin daily. 6.  Prilosec 20 mg daily.  7.  K-Dur 10 mEq b.i.d.  8.  She also uses Imodium for colitis.     ALLERGIES: Sulfa and erythromycin.     REVIEW OF SYSTEMS:  A 14 point review of systems is unremarkable except for cataracts, morning cough and less than 10 years of hypertension.  She also has collagenous colitis for which she is on Entocort, which she will finish up shortly.  She has not had an episode of this for about 3 years until now.     FAMILY HISTORY:  Positive for her mother who died at age 56 from cardiovascular disease and possible smoking.  Her father died at age 9 from a similar diagnosis as her mother.  She has 2 living brothers who are twins at age 52.  They have hypertension and diabetes.  She has 3 sisters ages 68, 75 and 30 who have hypertension.  SOCIAL HISTORY:  [name] is a 73 year old white, married female.  She denies smoking at this time, but she has intermittently smoked over the past.  She quit in 1989 and she had about a 20-year history of intermittent 1 pack of cigarettes per day.  She does use a bedtime glass of wine and sometimes a glass of wine with dinner.     PHYSICAL EXAMINATION:   Vital signs reveal her temperature of 98.8, pulse 56, respiration 12, blood pressure 130/78.   Height 5 feet 4 inches and weight 167 pounds.  BMI 28.7.  Head is normocephalic. Eyes:  Pupils equal, round and reactive to light and accommodation with extraocular movements intact.  Ears, nose and throat were benign.  Neck was supple and no bruits were noted.  Chest had good expansion. Lungs were essentially clear.  Cardiac has a regular rhythm and rat and a very  slow rhythm at this time and almost a bradycardic rhythm.  It was regular that I could tell.  There was a normal S1 and S2.  No murmur, rub or gallop could be appreciated.   Pulse is 1+ bilateral and symmetric in the lower extremities.   Abdomen is scaphoid, soft and nontender.  No masses palpable and normal bowel sounds present.   CNS:  Oriented x3 and cranial nerves II-XII grossly intact.   Musculoskeletal:  Today she has range of motion from about 4-5 degrees to 100 degrees.  She is certainly genu varum. She does have a Baker's cyst palpable.  Crepitus with range of motion.  No hip or groin pain.     RADIOGRAPHS:  End-stage osteoarthritis of the right knee. The radiographic studies reveal bone on bone medial compartment OA with significant OA of the patellofemoral joint also.  Multiple loose bodies are noted throughout the knee more in the posterior aspect.     CLINICAL IMPRESSION:   1.  End-stage osteoarthritis of the right knee.  2.  Atrial fibrillation, on Pradaxa. 3.  History of collagenous colitis.  4.  Hypertension.    RECOMMENDATIONS:  At this time I have reviewed clearance forms from Willette Alma who felt that she, from a cardiac and medical standpoint, was cleared for a surgical intervention.  She is to hold her Pradaxa for 48 hours.  As for Dr. Stanford Breed, he felt that she was okay for surgery from a cardiac standpoint.  He would like the Pradaxa to be resumed the day after surgery.     Therefore our plan is to consider her for a right total knee arthroplasty.  The procedure, risks and benefits were fully explained to her.  She is understanding.  All questions were answered.  We will plan to proceed with a total knee replacement on the right in the very near future.

## 2013-09-07 NOTE — Telephone Encounter (Signed)
Refill-omeprazole dr 20mg  capsule. Take one capsule daily. Qty 42 cap. Last fill 1.23.15

## 2013-09-07 NOTE — Progress Notes (Signed)
Anesthesia Chart Review:  Patient is a 73 year old female scheduled for right TKR on 09/18/13 by Dr. Durward Fortes.  History includes former smoker, HLD, poliomyelitis at age 9, GERD, afib/PAF (diagnosed 05/2011), collagenous colitis (Dr. Olevia Perches), GERD, anemia, skin cancer, epidural infection X 2. PCP is Debbrah Alar, NP with Dr. Penni Homans.  [name] [name] cleared patient for surgery.  Cardiologist is Dr. [name].  EKG on 08/07/13 showed SB, PACs, septal infarct (age undetermined).  He had a normal nuclear stress test, EF 69% on 07/15/11.  Echo on 06/09/11 [facility]) showed: Normal LV function, EF 60-65%. There was grade 1 diastolic dysfunction. There was mild left atrial enlargement and mild right atrial enlargement. There was mild aortic insufficiency. There was mild tricuspid regurgitation.  Event monitor in 07/2011 showed SR with PACs.  CXR on 07/24/13 showed no active cardiopulmonary disease.  Preoperative labs noted.  Pradaxa will be on hold preoperatively starting 09/08/13.  Repeat PT/PTT on arrival.  If follow-up labs are acceptable and no significant changes then I would anticipate that she could proceed as planned.  George Hugh [facility]/Anesthesiology Phone [phone] 09/07/2013 2:31 PM

## 2013-09-17 MED ORDER — CEFAZOLIN SODIUM-DEXTROSE 2-3 GM-% IV SOLR
2.0000 g | INTRAVENOUS | Status: AC
  Administered 2013-09-18: 2 g via INTRAVENOUS

## 2013-09-18 ENCOUNTER — Inpatient Hospital Stay (HOSPITAL_COMMUNITY)
Admission: RE | Admit: 2013-09-18 | Discharge: 2013-09-21 | DRG: 470 | Disposition: A | Discharge status: Skilled Nursing Facility | Payer: Medicare PPO | Source: Ambulatory Visit | Attending: Orthopaedic Surgery | Admitting: Orthopaedic Surgery

## 2013-09-18 ENCOUNTER — Encounter (HOSPITAL_COMMUNITY)
Admission: RE | Disposition: A | Discharge status: Skilled Nursing Facility | Payer: Self-pay | Source: Ambulatory Visit | Attending: Orthopaedic Surgery

## 2013-09-18 ENCOUNTER — Encounter (HOSPITAL_COMMUNITY): Payer: Self-pay | Admitting: Surgery

## 2013-09-18 ENCOUNTER — Encounter (HOSPITAL_COMMUNITY): Payer: Medicare PPO | Admitting: Vascular Surgery

## 2013-09-18 ENCOUNTER — Inpatient Hospital Stay (HOSPITAL_COMMUNITY): Payer: Medicare PPO | Admitting: Anesthesiology

## 2013-09-18 DIAGNOSIS — Z96659 Presence of unspecified artificial knee joint: Secondary | ICD-10-CM

## 2013-09-18 DIAGNOSIS — M1710 Unilateral primary osteoarthritis, unspecified knee: Principal | ICD-10-CM | POA: Diagnosis present

## 2013-09-18 DIAGNOSIS — K219 Gastro-esophageal reflux disease without esophagitis: Secondary | ICD-10-CM | POA: Diagnosis present

## 2013-09-18 DIAGNOSIS — Z85828 Personal history of other malignant neoplasm of skin: Secondary | ICD-10-CM

## 2013-09-18 DIAGNOSIS — E785 Hyperlipidemia, unspecified: Secondary | ICD-10-CM | POA: Diagnosis present

## 2013-09-18 DIAGNOSIS — I4891 Unspecified atrial fibrillation: Secondary | ICD-10-CM | POA: Diagnosis present

## 2013-09-18 DIAGNOSIS — D62 Acute posthemorrhagic anemia: Secondary | ICD-10-CM | POA: Diagnosis not present

## 2013-09-18 DIAGNOSIS — Z8612 Personal history of poliomyelitis: Secondary | ICD-10-CM

## 2013-09-18 DIAGNOSIS — Z79899 Other long term (current) drug therapy: Secondary | ICD-10-CM

## 2013-09-18 DIAGNOSIS — Z87891 Personal history of nicotine dependence: Secondary | ICD-10-CM

## 2013-09-18 DIAGNOSIS — I10 Essential (primary) hypertension: Secondary | ICD-10-CM | POA: Diagnosis present

## 2013-09-18 DIAGNOSIS — Z7901 Long term (current) use of anticoagulants: Secondary | ICD-10-CM

## 2013-09-18 DIAGNOSIS — M1711 Unilateral primary osteoarthritis, right knee: Secondary | ICD-10-CM | POA: Diagnosis present

## 2013-09-18 HISTORY — PX: TOTAL KNEE ARTHROPLASTY: SHX125

## 2013-09-18 LAB — APTT: aPTT: 31 seconds (ref 24–37)

## 2013-09-18 LAB — PROTIME-INR
INR: 0.97 (ref 0.00–1.49)
Prothrombin Time: 12.7 seconds (ref 11.6–15.2)

## 2013-09-18 SURGERY — ARTHROPLASTY, KNEE, TOTAL
Anesthesia: Regional | Site: Knee | Laterality: Right

## 2013-09-18 MED ORDER — CHLORTHALIDONE 25 MG PO TABS
25.0000 mg | ORAL_TABLET | Freq: Every day | ORAL | Status: DC
  Administered 2013-09-19 – 2013-09-21 (×3): 25 mg via ORAL
  Filled 2013-09-18 (×3): qty 1

## 2013-09-18 MED ORDER — METOCLOPRAMIDE HCL 5 MG/ML IJ SOLN: 5.0000 mg | Freq: Three times a day (TID) | INTRAMUSCULAR | Status: DC | PRN

## 2013-09-18 MED ORDER — PHENYLEPHRINE 40 MCG/ML (10ML) SYRINGE FOR IV PUSH (FOR BLOOD PRESSURE SUPPORT)
PREFILLED_SYRINGE | INTRAVENOUS | Status: AC
  Filled 2013-09-18: qty 10

## 2013-09-18 MED ORDER — METHOCARBAMOL 500 MG PO TABS
500.0000 mg | ORAL_TABLET | Freq: Four times a day (QID) | ORAL | Status: DC | PRN
  Administered 2013-09-18 – 2013-09-21 (×12): 500 mg via ORAL
  Filled 2013-09-18 (×11): qty 1

## 2013-09-18 MED ORDER — GLYCOPYRROLATE 0.2 MG/ML IJ SOLN
INTRAMUSCULAR | Status: DC | PRN
  Administered 2013-09-18: 0.4 mg via INTRAVENOUS

## 2013-09-18 MED ORDER — MIDAZOLAM HCL 5 MG/5ML IJ SOLN
INTRAMUSCULAR | Status: DC | PRN
  Administered 2013-09-18: 2 mg via INTRAVENOUS

## 2013-09-18 MED ORDER — ARTIFICIAL TEARS OP OINT
TOPICAL_OINTMENT | OPHTHALMIC | Status: DC | PRN
  Administered 2013-09-18: 1 via OPHTHALMIC

## 2013-09-18 MED ORDER — NEOSTIGMINE METHYLSULFATE 1 MG/ML IJ SOLN
INTRAMUSCULAR | Status: DC | PRN
  Administered 2013-09-18: 3 mg via INTRAVENOUS

## 2013-09-18 MED ORDER — ACETAMINOPHEN 10 MG/ML IV SOLN
1000.0000 mg | Freq: Four times a day (QID) | INTRAVENOUS | Status: AC
  Administered 2013-09-18 – 2013-09-19 (×3): 1000 mg via INTRAVENOUS
  Filled 2013-09-18 (×3): qty 100

## 2013-09-18 MED ORDER — ACETAMINOPHEN 325 MG PO TABS
650.0000 mg | ORAL_TABLET | Freq: Four times a day (QID) | ORAL | Status: DC | PRN
  Administered 2013-09-20: 650 mg via ORAL
  Filled 2013-09-18: qty 2

## 2013-09-18 MED ORDER — DILTIAZEM HCL ER COATED BEADS 180 MG PO CP24
180.0000 mg | ORAL_CAPSULE | Freq: Every day | ORAL | Status: DC
  Administered 2013-09-19 – 2013-09-21 (×3): 180 mg via ORAL
  Filled 2013-09-18 (×3): qty 1

## 2013-09-18 MED ORDER — BISACODYL 10 MG RE SUPP: 10.0000 mg | Freq: Every day | RECTAL | Status: DC | PRN

## 2013-09-18 MED ORDER — ROCURONIUM BROMIDE 50 MG/5ML IV SOLN
INTRAVENOUS | Status: AC
  Filled 2013-09-18: qty 1

## 2013-09-18 MED ORDER — MAGNESIUM HYDROXIDE 400 MG/5ML PO SUSP: 30.0000 mL | Freq: Every day | ORAL | Status: DC | PRN

## 2013-09-18 MED ORDER — CHLORHEXIDINE GLUCONATE 4 % EX LIQD: 60.0000 mL | Freq: Once | CUTANEOUS | Status: DC

## 2013-09-18 MED ORDER — HYDROMORPHONE HCL PF 1 MG/ML IJ SOLN
INTRAMUSCULAR | Status: AC
  Administered 2013-09-18: 0.5 mg via INTRAVENOUS
  Filled 2013-09-18: qty 1

## 2013-09-18 MED ORDER — ONDANSETRON HCL 4 MG/2ML IJ SOLN
INTRAMUSCULAR | Status: AC
  Filled 2013-09-18: qty 2

## 2013-09-18 MED ORDER — OXYCODONE HCL 5 MG PO TABS
5.0000 mg | ORAL_TABLET | Freq: Once | ORAL | Status: AC | PRN
  Administered 2013-09-18: 5 mg via ORAL

## 2013-09-18 MED ORDER — CEFAZOLIN SODIUM-DEXTROSE 2-3 GM-% IV SOLR
2.0000 g | Freq: Four times a day (QID) | INTRAVENOUS | Status: AC
  Administered 2013-09-18 (×2): 2 g via INTRAVENOUS
  Filled 2013-09-18 (×2): qty 50

## 2013-09-18 MED ORDER — POTASSIUM CHLORIDE ER 10 MEQ PO TBCR
10.0000 meq | EXTENDED_RELEASE_TABLET | Freq: Two times a day (BID) | ORAL | Status: DC
  Administered 2013-09-18 – 2013-09-21 (×6): 10 meq via ORAL
  Filled 2013-09-18 (×7): qty 1

## 2013-09-18 MED ORDER — BISOPROLOL FUMARATE 5 MG PO TABS
2.5000 mg | ORAL_TABLET | Freq: Every day | ORAL | Status: DC
  Administered 2013-09-19 – 2013-09-21 (×3): 2.5 mg via ORAL
  Filled 2013-09-18 (×3): qty 0.5

## 2013-09-18 MED ORDER — SODIUM CHLORIDE 0.9 % IJ SOLN
INTRAMUSCULAR | Status: AC
  Filled 2013-09-18: qty 10

## 2013-09-18 MED ORDER — DABIGATRAN ETEXILATE MESYLATE 150 MG PO CAPS
150.0000 mg | ORAL_CAPSULE | Freq: Two times a day (BID) | ORAL | Status: DC
  Administered 2013-09-19 – 2013-09-21 (×5): 150 mg via ORAL
  Filled 2013-09-18 (×6): qty 1

## 2013-09-18 MED ORDER — SODIUM CHLORIDE 0.9 % IV SOLN: INTRAVENOUS | Status: DC

## 2013-09-18 MED ORDER — DIPHENHYDRAMINE HCL 50 MG PO CAPS: 50.0000 mg | ORAL_CAPSULE | Freq: Every day | ORAL | Status: DC

## 2013-09-18 MED ORDER — FENTANYL CITRATE 0.05 MG/ML IJ SOLN
INTRAMUSCULAR | Status: DC | PRN
  Administered 2013-09-18 (×5): 50 ug via INTRAVENOUS

## 2013-09-18 MED ORDER — ROCURONIUM BROMIDE 100 MG/10ML IV SOLN
INTRAVENOUS | Status: DC | PRN
  Administered 2013-09-18: 50 mg via INTRAVENOUS

## 2013-09-18 MED ORDER — FLEET ENEMA 7-19 GM/118ML RE ENEM: 1.0000 | ENEMA | Freq: Once | RECTAL | Status: AC | PRN

## 2013-09-18 MED ORDER — KETOROLAC TROMETHAMINE 15 MG/ML IJ SOLN
15.0000 mg | Freq: Four times a day (QID) | INTRAMUSCULAR | Status: AC
  Administered 2013-09-18 (×2): 15 mg via INTRAVENOUS
  Filled 2013-09-18 (×2): qty 1

## 2013-09-18 MED ORDER — ARTIFICIAL TEARS OP OINT
TOPICAL_OINTMENT | OPHTHALMIC | Status: AC
  Filled 2013-09-18: qty 3.5

## 2013-09-18 MED ORDER — ONDANSETRON HCL 4 MG/2ML IJ SOLN
INTRAMUSCULAR | Status: DC | PRN
  Administered 2013-09-18: 4 mg via INTRAVENOUS

## 2013-09-18 MED ORDER — ONDANSETRON HCL 4 MG/2ML IJ SOLN: 4.0000 mg | Freq: Four times a day (QID) | INTRAMUSCULAR | Status: DC | PRN

## 2013-09-18 MED ORDER — PROPOFOL 10 MG/ML IV BOLUS
INTRAVENOUS | Status: DC | PRN
  Administered 2013-09-18: 170 mg via INTRAVENOUS
  Administered 2013-09-18: 20 mg via INTRAVENOUS

## 2013-09-18 MED ORDER — PHENOL 1.4 % MT LIQD: 1.0000 | OROMUCOSAL | Status: DC | PRN

## 2013-09-18 MED ORDER — THROMBIN 20000 UNITS EX KIT
PACK | CUTANEOUS | Status: AC
  Filled 2013-09-18: qty 1

## 2013-09-18 MED ORDER — LIDOCAINE HCL (CARDIAC) 20 MG/ML IV SOLN
INTRAVENOUS | Status: AC
  Filled 2013-09-18: qty 5

## 2013-09-18 MED ORDER — OXYCODONE HCL 5 MG PO TABS
5.0000 mg | ORAL_TABLET | ORAL | Status: DC | PRN
  Administered 2013-09-18 (×2): 5 mg via ORAL
  Administered 2013-09-19: 10 mg via ORAL
  Administered 2013-09-19 (×2): 5 mg via ORAL
  Administered 2013-09-19 – 2013-09-20 (×4): 10 mg via ORAL
  Administered 2013-09-20: 5 mg via ORAL
  Administered 2013-09-20 (×2): 10 mg via ORAL
  Administered 2013-09-20: 5 mg via ORAL
  Administered 2013-09-20 – 2013-09-21 (×4): 10 mg via ORAL
  Administered 2013-09-21 (×3): 5 mg via ORAL
  Filled 2013-09-18 (×3): qty 2
  Filled 2013-09-18 (×2): qty 1
  Filled 2013-09-18 (×3): qty 2
  Filled 2013-09-18: qty 1
  Filled 2013-09-18: qty 2
  Filled 2013-09-18 (×4): qty 1
  Filled 2013-09-18: qty 2
  Filled 2013-09-18: qty 1
  Filled 2013-09-18: qty 2
  Filled 2013-09-18: qty 1
  Filled 2013-09-18 (×2): qty 2

## 2013-09-18 MED ORDER — DEXTROSE 5 % IV SOLN
500.0000 mg | Freq: Four times a day (QID) | INTRAVENOUS | Status: DC | PRN
  Filled 2013-09-18: qty 5

## 2013-09-18 MED ORDER — MIDAZOLAM HCL 2 MG/2ML IJ SOLN
INTRAMUSCULAR | Status: AC
  Filled 2013-09-18: qty 2

## 2013-09-18 MED ORDER — PANTOPRAZOLE SODIUM 40 MG PO TBEC
40.0000 mg | DELAYED_RELEASE_TABLET | Freq: Every day | ORAL | Status: DC
  Administered 2013-09-19 – 2013-09-21 (×3): 40 mg via ORAL
  Filled 2013-09-18 (×3): qty 1

## 2013-09-18 MED ORDER — MENTHOL 3 MG MT LOZG
1.0000 | LOZENGE | OROMUCOSAL | Status: DC | PRN
Start: 2013-09-18 — End: 2013-09-21

## 2013-09-18 MED ORDER — BUPIVACAINE-EPINEPHRINE 0.25% -1:200000 IJ SOLN
INTRAMUSCULAR | Status: DC | PRN
  Administered 2013-09-18: 30 mL

## 2013-09-18 MED ORDER — ZOLPIDEM TARTRATE 5 MG PO TABS: 5.0000 mg | ORAL_TABLET | Freq: Every evening | ORAL | Status: DC | PRN

## 2013-09-18 MED ORDER — BUPIVACAINE-EPINEPHRINE PF 0.5-1:200000 % IJ SOLN
INTRAMUSCULAR | Status: DC | PRN
  Administered 2013-09-18: 30 mL via PERINEURAL

## 2013-09-18 MED ORDER — ASPIRIN EC 325 MG PO TBEC
325.0000 mg | DELAYED_RELEASE_TABLET | Freq: Every day | ORAL | Status: DC
  Filled 2013-09-18 (×2): qty 1

## 2013-09-18 MED ORDER — BUDESONIDE 3 MG PO CP24
6.0000 mg | ORAL_CAPSULE | Freq: Every day | ORAL | Status: DC | PRN
  Filled 2013-09-18: qty 2

## 2013-09-18 MED ORDER — NEOSTIGMINE METHYLSULFATE 1 MG/ML IJ SOLN
INTRAMUSCULAR | Status: AC
  Filled 2013-09-18: qty 10

## 2013-09-18 MED ORDER — EPHEDRINE SULFATE 50 MG/ML IJ SOLN
INTRAMUSCULAR | Status: AC
  Filled 2013-09-18: qty 1

## 2013-09-18 MED ORDER — GLYCOPYRROLATE 0.2 MG/ML IJ SOLN
INTRAMUSCULAR | Status: AC
  Filled 2013-09-18: qty 2

## 2013-09-18 MED ORDER — BUPIVACAINE-EPINEPHRINE (PF) 0.25% -1:200000 IJ SOLN
INTRAMUSCULAR | Status: AC
  Filled 2013-09-18: qty 30

## 2013-09-18 MED ORDER — HYDROMORPHONE HCL PF 1 MG/ML IJ SOLN
0.5000 mg | INTRAMUSCULAR | Status: DC | PRN
  Administered 2013-09-18: 0.5 mg via INTRAVENOUS
  Filled 2013-09-18: qty 1

## 2013-09-18 MED ORDER — SODIUM CHLORIDE 0.9 % IV SOLN
75.0000 mL/h | INTRAVENOUS | Status: DC
  Administered 2013-09-18: 75 mL/h via INTRAVENOUS

## 2013-09-18 MED ORDER — THROMBIN 20000 UNITS EX KIT
PACK | CUTANEOUS | Status: DC | PRN
  Administered 2013-09-18: 20000 [IU] via TOPICAL

## 2013-09-18 MED ORDER — ACETAMINOPHEN 650 MG RE SUPP: 650.0000 mg | Freq: Four times a day (QID) | RECTAL | Status: DC | PRN

## 2013-09-18 MED ORDER — METHOCARBAMOL 500 MG PO TABS
ORAL_TABLET | ORAL | Status: AC
  Administered 2013-09-18: 500 mg via ORAL
  Filled 2013-09-18: qty 1

## 2013-09-18 MED ORDER — ACETAMINOPHEN 10 MG/ML IV SOLN
INTRAVENOUS | Status: AC
  Administered 2013-09-18: 1000 mg via INTRAVENOUS
  Filled 2013-09-18: qty 100

## 2013-09-18 MED ORDER — LACTATED RINGERS IV SOLN
INTRAVENOUS | Status: DC | PRN
  Administered 2013-09-18 (×2): via INTRAVENOUS

## 2013-09-18 MED ORDER — OXYCODONE HCL 5 MG PO TABS
ORAL_TABLET | ORAL | Status: AC
  Administered 2013-09-18: 5 mg via ORAL
  Filled 2013-09-18: qty 1

## 2013-09-18 MED ORDER — METOCLOPRAMIDE HCL 10 MG PO TABS: 5.0000 mg | ORAL_TABLET | Freq: Three times a day (TID) | ORAL | Status: DC | PRN

## 2013-09-18 MED ORDER — LIDOCAINE HCL (CARDIAC) 20 MG/ML IV SOLN
INTRAVENOUS | Status: DC | PRN
  Administered 2013-09-18: 50 mg via INTRAVENOUS

## 2013-09-18 MED ORDER — ONDANSETRON HCL 4 MG PO TABS
4.0000 mg | ORAL_TABLET | Freq: Four times a day (QID) | ORAL | Status: DC | PRN
  Administered 2013-09-21: 4 mg via ORAL
  Filled 2013-09-18: qty 1

## 2013-09-18 MED ORDER — SUCCINYLCHOLINE CHLORIDE 20 MG/ML IJ SOLN
INTRAMUSCULAR | Status: AC
  Filled 2013-09-18: qty 1

## 2013-09-18 MED ORDER — SODIUM CHLORIDE 0.9 % IR SOLN
Status: DC | PRN
  Administered 2013-09-18: 1000 mL

## 2013-09-18 MED ORDER — KETOROLAC TROMETHAMINE 30 MG/ML IJ SOLN
INTRAMUSCULAR | Status: AC
  Administered 2013-09-18: 15 mg
  Filled 2013-09-18: qty 1

## 2013-09-18 MED ORDER — OXYCODONE HCL 5 MG/5ML PO SOLN: 5.0000 mg | Freq: Once | ORAL | Status: AC | PRN

## 2013-09-18 MED ORDER — PROPOFOL 10 MG/ML IV BOLUS
INTRAVENOUS | Status: AC
  Filled 2013-09-18: qty 20

## 2013-09-18 MED ORDER — ALUM & MAG HYDROXIDE-SIMETH 200-200-20 MG/5ML PO SUSP: 30.0000 mL | ORAL | Status: DC | PRN

## 2013-09-18 MED ORDER — FENTANYL CITRATE 0.05 MG/ML IJ SOLN
INTRAMUSCULAR | Status: AC
  Filled 2013-09-18: qty 5

## 2013-09-18 MED ORDER — HYDROMORPHONE HCL PF 1 MG/ML IJ SOLN
0.2500 mg | INTRAMUSCULAR | Status: DC | PRN
  Administered 2013-09-18 (×4): 0.5 mg via INTRAVENOUS

## 2013-09-18 SURGICAL SUPPLY — 61 items
BANDAGE ESMARK 6X9 LF (GAUZE/BANDAGES/DRESSINGS) ×1 IMPLANT
BLADE SAGITTAL 25.0X1.19X90 (BLADE) ×2 IMPLANT
BLADE SAGITTAL 25.0X1.19X90MM (BLADE) ×1
BNDG CMPR 9X6 STRL LF SNTH (GAUZE/BANDAGES/DRESSINGS) ×1
BNDG ESMARK 6X9 LF (GAUZE/BANDAGES/DRESSINGS) ×3
BOWL SMART MIX CTS (DISPOSABLE) ×3 IMPLANT
CAPT RP KNEE ×2 IMPLANT
CEMENT HV SMART SET (Cement) ×6 IMPLANT
CLOTH BEACON ORANGE TIMEOUT ST (SAFETY) ×3 IMPLANT
COVER BACK TABLE 24X17X13 BIG (DRAPES) ×3 IMPLANT
COVER SURGICAL LIGHT HANDLE (MISCELLANEOUS) ×3 IMPLANT
CUFF TOURNIQUET SINGLE 34IN LL (TOURNIQUET CUFF) IMPLANT
CUFF TOURNIQUET SINGLE 44IN (TOURNIQUET CUFF) IMPLANT
DRAPE EXTREMITY T 121X128X90 (DRAPE) ×3 IMPLANT
DRAPE PROXIMA HALF (DRAPES) ×3 IMPLANT
DRSG ADAPTIC 3X8 NADH LF (GAUZE/BANDAGES/DRESSINGS) ×3 IMPLANT
DRSG PAD ABDOMINAL 8X10 ST (GAUZE/BANDAGES/DRESSINGS) ×4 IMPLANT
DURAPREP 26ML APPLICATOR (WOUND CARE) ×3 IMPLANT
ELECT CAUTERY BLADE 6.4 (BLADE) ×3 IMPLANT
ELECT REM PT RETURN 9FT ADLT (ELECTROSURGICAL) ×3
ELECTRODE REM PT RTRN 9FT ADLT (ELECTROSURGICAL) ×1 IMPLANT
EVACUATOR 1/8 PVC DRAIN (DRAIN) IMPLANT
FACESHIELD LNG OPTICON STERILE (SAFETY) ×6 IMPLANT
FLOSEAL 10ML (HEMOSTASIS) IMPLANT
GLOVE BIOGEL PI IND STRL 8 (GLOVE) ×1 IMPLANT
GLOVE BIOGEL PI IND STRL 8.5 (GLOVE) ×1 IMPLANT
GLOVE BIOGEL PI INDICATOR 8 (GLOVE) ×2
GLOVE BIOGEL PI INDICATOR 8.5 (GLOVE) ×2
GLOVE ECLIPSE 8.0 STRL XLNG CF (GLOVE) ×6 IMPLANT
GLOVE SURG ORTHO 8.5 STRL (GLOVE) ×6 IMPLANT
GOWN STRL NON-REIN LRG LVL3 (GOWN DISPOSABLE) ×6 IMPLANT
GOWN STRL REUS W/TWL 2XL LVL3 (GOWN DISPOSABLE) ×3 IMPLANT
HANDPIECE INTERPULSE COAX TIP (DISPOSABLE) ×3
KIT BASIN OR (CUSTOM PROCEDURE TRAY) ×3 IMPLANT
KIT ROOM TURNOVER OR (KITS) ×3 IMPLANT
MANIFOLD NEPTUNE II (INSTRUMENTS) ×3 IMPLANT
NEEDLE 22X1 1/2 (OR ONLY) (NEEDLE) IMPLANT
NS IRRIG 1000ML POUR BTL (IV SOLUTION) ×3 IMPLANT
PACK TOTAL JOINT (CUSTOM PROCEDURE TRAY) ×3 IMPLANT
PAD ARMBOARD 7.5X6 YLW CONV (MISCELLANEOUS) ×6 IMPLANT
PAD CAST 4YDX4 CTTN HI CHSV (CAST SUPPLIES) ×1 IMPLANT
PADDING CAST COTTON 4X4 STRL (CAST SUPPLIES) ×3
PADDING CAST COTTON 6X4 STRL (CAST SUPPLIES) ×3 IMPLANT
SET HNDPC FAN SPRY TIP SCT (DISPOSABLE) ×1 IMPLANT
SPONGE GAUZE 4X4 12PLY (GAUZE/BANDAGES/DRESSINGS) ×3 IMPLANT
SPONGE GAUZE 4X4 12PLY STER LF (GAUZE/BANDAGES/DRESSINGS) ×2 IMPLANT
STAPLER VISISTAT 35W (STAPLE) ×3 IMPLANT
SUCTION FRAZIER TIP 10 FR DISP (SUCTIONS) ×3 IMPLANT
SUT BONE WAX W31G (SUTURE) ×3 IMPLANT
SUT ETHIBOND NAB CT1 #1 30IN (SUTURE) ×9 IMPLANT
SUT MNCRL AB 3-0 PS2 18 (SUTURE) ×3 IMPLANT
SUT VIC AB 0 CT1 27 (SUTURE) ×3
SUT VIC AB 0 CT1 27XBRD ANBCTR (SUTURE) ×1 IMPLANT
SUT VIC AB 1 CT1 27 (SUTURE) ×3
SUT VIC AB 1 CT1 27XBRD ANBCTR (SUTURE) ×1 IMPLANT
SYR CONTROL 10ML LL (SYRINGE) IMPLANT
TOWEL OR 17X24 6PK STRL BLUE (TOWEL DISPOSABLE) ×3 IMPLANT
TOWEL OR 17X26 10 PK STRL BLUE (TOWEL DISPOSABLE) ×3 IMPLANT
TRAY FOLEY CATH 16FRSI W/METER (SET/KITS/TRAYS/PACK) IMPLANT
WATER STERILE IRR 1000ML POUR (IV SOLUTION) ×6 IMPLANT
WRAP KNEE MAXI GEL POST OP (GAUZE/BANDAGES/DRESSINGS) ×3 IMPLANT

## 2013-09-18 NOTE — Preoperative (Signed)
Beta Blockers   Reason not to administer Beta Blockers:Not Applicable 

## 2013-09-18 NOTE — Transfer of Care (Signed)
Immediate Anesthesia Transfer of Care Note  Patient: [name] [name]  Procedure(s) Performed: Procedure(s): TOTAL KNEE ARTHROPLASTY (Right)  Patient Location: PACU  Anesthesia Type:GA combined with regional for post-op pain  Level of Consciousness: awake, alert , oriented and patient cooperative  Airway & Oxygen Therapy: Patient Spontanous Breathing and Patient connected to nasal cannula oxygen  Post-op Assessment: Report given to PACU RN, Post -op Vital signs reviewed and stable and Patient moving all extremities  Post vital signs: Reviewed and stable  Complications: No apparent anesthesia complications

## 2013-09-18 NOTE — Anesthesia Postprocedure Evaluation (Signed)
Anesthesia Post Note  Patient: [name] [name]  Procedure(s) Performed: Procedure(s) (LRB): TOTAL KNEE ARTHROPLASTY (Right)  Anesthesia type: General  Patient location: PACU  Post pain: Pain level controlled and Adequate analgesia  Post assessment: Post-op Vital signs reviewed, Patient's Cardiovascular Status Stable, Respiratory Function Stable, Patent Airway and Pain level controlled  Last Vitals:  Filed Vitals:   09/18/13 1045  BP:   Pulse: 46  Temp: 36.6 C  Resp: 14    Post vital signs: Reviewed and stable  Level of consciousness: awake, alert  and oriented  Complications: No apparent anesthesia complications

## 2013-09-18 NOTE — Care Management Note (Signed)
CARE MANAGEMENT NOTE 09/18/2013  Patient:  [name] [name], [name] [name]   Account Number:  [id number]  Date Initiated:  09/18/2013  Documentation initiated by:  Ricki Miller  Subjective/Objective Assessment:   73 yr old female s/p right total knee arthroplasty.     Action/Plan:   Patient preoperatively setup with [facility]. CM waiting PT/OT eval.   Anticipated DC Date:     Anticipated DC Plan:           Choice offered to / List presented to:             Status of service:  In process, will continue to follow

## 2013-09-18 NOTE — H&P (Signed)
  The recent History & Physical has been reviewed. I have personally examined the patient today. There is no interval change to the documented History & Physical. The patient would like to proceed with the procedure.  [name] W 09/18/2013,  7:19 AM  The recent History & Physical has been reviewed. I have personally examined the patient today. There is no interval change to the documented History & Physical. The patient would like to proceed with the procedure.  [name] W 09/18/2013,  7:19 AM

## 2013-09-18 NOTE — Anesthesia Procedure Notes (Addendum)
Anesthesia Regional Block:  Femoral nerve block  Pre-Anesthetic Checklist: ,, timeout performed, Correct Patient, Correct Site, Correct Laterality, Correct Procedure,, site marked, risks and benefits discussed, Surgical consent,  Pre-op evaluation,  At surgeon's request and post-op pain management  Laterality: Right  Prep: chloraprep       Needles:  Injection technique: Single-shot  Needle Type: Echogenic Stimulator Needle     Needle Length: 9cm  Needle Gauge: 21 and 21 G    Additional Needles:  Procedures: nerve stimulator Femoral nerve block  Nerve Stimulator or Paresthesia:  Response: Quadriceps muscle contraction, 0.45 mA,   Additional Responses:   Narrative:  Start time: 09/18/2013 7:01 AM End time: 09/18/2013 7:12 AM Injection made incrementally with aspirations every 5 mL.  Performed by: Personally  Anesthesiologist: Dr Marcie Bal  Additional Notes: Functioning IV was confirmed and monitors were applied.  A 67mm 21ga Arrow echogenic stimulator needle was used. Sterile prep and drape,hand hygiene and sterile gloves were used.  Negative aspiration and negative test dose prior to incremental administration of local anesthetic. The patient tolerated the procedure well.     Procedure Name: Intubation Date/Time: 09/18/2013 7:35 AM Performed by: Julian Reil Pre-anesthesia Checklist: Patient identified, Emergency Drugs available, Suction available and Patient being monitored Patient Re-evaluated:Patient Re-evaluated prior to inductionOxygen Delivery Method: Circle system utilized Preoxygenation: Pre-oxygenation with 100% oxygen Intubation Type: IV induction Ventilation: Mask ventilation without difficulty Laryngoscope Size: Mac and 3 Grade View: Grade II Tube type: Oral Tube size: 7.0 mm Number of attempts: 1 Airway Equipment and Method: Stylet Placement Confirmation: positive ETCO2,  ETT inserted through vocal cords under direct vision and breath sounds checked-  equal and bilateral Secured at: 22 cm Tube secured with: Tape Dental Injury: Teeth and Oropharynx as per pre-operative assessment

## 2013-09-18 NOTE — Progress Notes (Signed)
error 

## 2013-09-18 NOTE — Op Note (Signed)
PATIENT ID:      [name] [name]  MRN:     [id number] DOB/AGE:    October 22, 1940 / 73 y.o.       OPERATIVE REPORT    DATE OF PROCEDURE:  09/18/2013       PREOPERATIVE DIAGNOSIS:   RIGHT KNEE OSTEOARTHRITIS-END STAGE                                                       Estimated body mass index is 27.13 kg/(m^2) as calculated from the following:   Height as of 08/30/13: 5\' 6"  (1.676 m).   Weight as of 08/07/13: 76.204 kg (168 lb).     POSTOPERATIVE DIAGNOSIS:   RIGHT KNEE OSTEOARTHRITIS -SAME                                                                   Estimated body mass index is 27.13 kg/(m^2) as calculated from the following:   Height as of 08/30/13: 5\' 6"  (1.676 m).   Weight as of 08/07/13: 76.204 kg (168 lb).     PROCEDURE:  Procedure(s): TOTAL KNEE ARTHROPLASTY right     SURGEON:  [name], MD    ASSISTANT:   Biagio Borg, PA-C   (Present and scrubbed throughout the case, critical for assistance with exposure, retraction, instrumentation, and closure.)          ANESTHESIA: regional and general     DRAINS: (right knee) Hemovact drain(s) in the clamped with  Suction Clamped :      TOURNIQUET TIME:  Total Tourniquet Time Documented: Thigh (Right) - 72 minutes Total: Thigh (Right) - 72 minutes     COMPLICATIONS:  None   CONDITION:  stable  PROCEDURE IN [id number]     [name] W 09/18/2013, 9:13 AM

## 2013-09-18 NOTE — Progress Notes (Signed)
Orthopedic Tech Progress Note Patient Details:  [name] [name] Feb 17, 1941 [id number]  CPM Right Knee CPM Right Knee: On Right Knee Flexion (Degrees): 60 Right Knee Extension (Degrees): 0 Additional Comments: Trapeze bar   Cammer, Theodoro Parma 09/18/2013, 10:36 AM

## 2013-09-18 NOTE — Anesthesia Preprocedure Evaluation (Signed)
Anesthesia Evaluation  Patient identified by MRN, date of birth, ID band Patient awake    Reviewed: Allergy & Precautions, H&P , NPO status , Patient's Chart, lab work & pertinent test results  Airway Mallampati: II  Neck ROM: full    Dental   Pulmonary former smoker,          Cardiovascular hypertension, + Peripheral Vascular Disease + dysrhythmias     Neuro/Psych    GI/Hepatic GERD-  ,  Endo/Other    Renal/GU      Musculoskeletal  (+) Arthritis -,   Abdominal   Peds  Hematology   Anesthesia Other Findings   Reproductive/Obstetrics                           Anesthesia Physical Anesthesia Plan  ASA: III  Anesthesia Plan: General and Regional   Post-op Pain Management:    Induction: Intravenous  Airway Management Planned: Oral ETT  Additional Equipment:   Intra-op Plan:   Post-operative Plan: Extubation in OR  Informed Consent: I have reviewed the patients History and Physical, chart, labs and discussed the procedure including the risks, benefits and alternatives for the proposed anesthesia with the patient or authorized representative who has indicated his/her understanding and acceptance.     Plan Discussed with: CRNA, Anesthesiologist and Surgeon  Anesthesia Plan Comments:         Anesthesia Quick Evaluation

## 2013-09-19 LAB — BASIC METABOLIC PANEL
BUN: 17 mg/dL (ref 6–23)
CALCIUM: 7.9 mg/dL — AB (ref 8.4–10.5)
CO2: 29 mEq/L (ref 19–32)
Chloride: 96 mEq/L (ref 96–112)
Creatinine, Ser: 0.64 mg/dL (ref 0.50–1.10)
GFR calc non Af Amer: 87 mL/min — ABNORMAL LOW (ref >90)
GLUCOSE: 124 mg/dL — AB (ref 70–99)
POTASSIUM: 3.6 meq/L — AB (ref 3.7–5.3)
SODIUM: 135 meq/L — AB (ref 137–147)

## 2013-09-19 LAB — CBC
HCT: 29 % — ABNORMAL LOW (ref 36.0–46.0)
HEMOGLOBIN: 10 g/dL — AB (ref 12.0–15.0)
MCH: 33.8 pg (ref 26.0–34.0)
MCHC: 34.5 g/dL (ref 30.0–36.0)
MCV: 98 fL (ref 78.0–100.0)
PLATELETS: 154 10*3/uL (ref 150–400)
RBC: 2.96 MIL/uL — ABNORMAL LOW (ref 3.87–5.11)
RDW: 13.1 % (ref 11.5–15.5)
WBC: 7 10*3/uL (ref 4.0–10.5)

## 2013-09-19 MED ORDER — ACETAMINOPHEN 10 MG/ML IV SOLN
1000.0000 mg | Freq: Once | INTRAVENOUS | Status: AC
  Administered 2013-09-19: 1000 mg via INTRAVENOUS

## 2013-09-19 NOTE — Progress Notes (Signed)
OT Cancellation Note  Patient Details Name: [name] [name] MRN: [id number] DOB: 05/19/1941   Cancelled Treatment:    Reason Eval/Treat Not Completed: OT screened, no needs identified, will sign off  Order received, reviewed chart. Pt planning to transfer to SNF for rehab. All further OT needs can be met in next venue of care. Please [place] [phone] if eval needed. Thank you.     [name] OTR/L [phone] 09/19/2013, 2:48 PM

## 2013-09-19 NOTE — Op Note (Signed)
NAME:  [name] [name], [name] [name]                   ACCOUNT NO.:  [id number]  MEDICAL RECORD NO.:  [id number]  LOCATION:  5N22C                        FACILITY:  [facility]  PHYSICIAN:  Vonna Kotyk. [place], M.D.DATE OF BIRTH:  09-26-1940  DATE OF PROCEDURE:  09/18/2013 DATE OF DISCHARGE:                              OPERATIVE REPORT   PREOPERATIVE DIAGNOSIS:  End-stage osteoarthritis, right knee.  POSTOPERATIVE DIAGNOSIS:  End-stage osteoarthritis, right knee.  PROCEDURE:  Right total knee replacement.  SURGEON:  Vonna Kotyk. Durward Fortes, MD  ASSISTANT:  Biagio Borg PA-C, who was present throughout the operative procedure to ensure its timely completion.  ANESTHESIA:  General with femoral nerve block.  COMPLICATIONS:  None.  COMPONENTS:  DePuy LCS standard femoral component, a #3 rotating keeled tray 12.5 mm bridging bearing, a metal-back 3-peg rotating patella.  All components were secured with polymethyl methacrylate.  PROCEDURE:  [name] was met in the holding area and identified the right knee as appropriate operative site and marked accordingly. Anesthesia performed a femoral nerve block.  The patient was then transported to room #1 and placed under general anesthesia without difficulty.  The right lower extremity was then placed in a thigh tourniquet, and the leg was prepped with chlorhexidine scrub and DuraPrep from the tourniquet to the tips of the toes.  Sterile draping was performed. With the extremity still elevated, it was Esmarch exsanguinated with a proximal tourniquet at 350 mmHg.  A midline longitudinal incision was made centered about the patella extending from the superior pouch to the tibial tubercle.  Via sharp dissection, incision was carried down to subcutaneous tissue.  Gross bleeders were Bovie coagulated.  First layer of capsule was incised in midline and medial parapatellar incision was then made with the Bovie. The joint was entered.  There was dark yellow joint  effusion approximately 20 mL in volume.  There were some areas of beefy-red synovitis, synovectomy was performed.  There were large osteophytes along the medial lateral femoral condyle and the medial tibial plateau, these were removed for sizing purposes.  The patient did have a fixed varus deformity and medial release was performed.  She also had about 8 to 9 degree fixed flexure contracture preoperatively, which was corrected postoperatively.  There was complete absence of articular cartilage along the medial femoral condyle and medial tibial plateau, and large areas of articular cartilage loss beneath the patella and the lateral compartment.  I measured a standard femoral component.  First, bony cuts were then made transversely in the proximal tibia with a 7-degree angle of declination using external tibial guide. At each bony cut on the femur and tibia, I did check the alignment jig.  Subsequent cuts were then made on the femur using the standard alignment jig.  ACL and PCL were removed as well as medial lateral menisci.  A lamina spreaders were inserted in both compartments to obtain visualization for the above. There were at least 5 ostial cartilaginous loose bodies that were removed, the largest was over a centimeter in diameter.  MCL and LCL remained intact.  Osteophytes in the posterior femoral condyles were removed with a curved 3/4 inch osteotome.  Final bony cuts were  made for tapering purposes on the femur after obtaining a distal femoral cut using the 3 degrees distal valgus position.  Retractors then placed about the tibia, was advanced anteriorly and measured a #3 tibial tray.  The center hole was made followed by the keeled cut.  Again, I checked the alignment to be sure it is appropriately aligned.  With the tibial jig in place, the 12.5 mm bridging bearing was inserted, 12.5 flexion and extension gaps were perfectly symmetrical throughout the procedure.   Standard femoral component was then inserted.  The knee was reduced and through a full range of motion, had full extension, no opening with varus or valgus stress and anatomic alignment.  The patella was prepared by removing 8 mm of bone leaving 13 mm of patella thickness, 3 holes were then made with the patella jig.  Trial patella was inserted and through a full range of motion, it was perfectly stable.  The trial components removed.  The joint was copiously irrigated with saline solution.  The final components were then impacted with polymethyl methacrylate, the #3 rotating tibial tray was inserted followed by the 12.5 mm bridging bearing and the standard femoral component.  The components were reduced.  Extraneous methacrylate was removed from the periphery of the components.  The patella was intact with the same methacrylate.  Approximately 16 minutes of methacrylate had matured, during which time we injected the knee with 0.25% Marcaine with epinephrine.  At 72 minutes, the tourniquet was deflated.  The joint was irrigated with saline solution.  Any gross bleeders were Bovie coagulated. Thrombin spray was placed about the joint surface.  A medium-size Hemovac was inserted.  We had excellent hemostasis.  The Hemovac was clamped . The deep capsule was closed with a combination of running and interrupted #1 Ethibond.  Superficial capsule closed with a running 0 Vicryl , subcu with 3-0 Monocryl and skin clips.  Sterile bulky dressing was applied followed by the patient's support stocking.  The patient tolerated the procedure without complications.     Vonna Kotyk. Durward Fortes, M.D.     PWW/MEDQ  D:  09/18/2013  T:  09/19/2013  Job:  [id number]

## 2013-09-19 NOTE — Progress Notes (Addendum)
Clinical Social Work Department CLINICAL SOCIAL WORK PLACEMENT NOTE 09/19/2013  Patient:  [name] [name], [name] [name]  Account Number:  [id number] Admit date:  09/18/2013  Clinical Social Worker:  Berton Mount, Latanya Presser  Date/time:  09/19/2013 02:00 PM  Clinical Social Work is seeking post-discharge placement for this patient at the following level of care:   [place]   (*CSW will update this form in Epic as items are completed)   09/19/2013  Patient/family provided with [place] Department of Clinical Social Work's list of facilities offering this level of care within the geographic area requested by the patient (or if unable, by the patient's family).  09/19/2013  Patient/family informed of their freedom to choose among providers that offer the needed level of care, that participate in Medicare, Medicaid or managed care program needed by the patient, have an available bed and are willing to accept the patient.  09/19/2013  Patient/family informed of [facility]' ownership interest in [facility], as well as of the fact that they are under no obligation to receive care at this facility.  PASARR submitted to EDS on 09/19/2013 PASARR number received from EDS on 09/19/2013  FL2 transmitted to all facilities in geographic area requested by pt/family on  09/19/2013 FL2 transmitted to all facilities within larger geographic area on   Patient informed that his/her managed care company has contracts with or will negotiate with  certain facilities, including the following:     Patient/family informed of bed offers received:  09/20/2013 Patient chooses bed at [facility] Physician recommends and patient chooses bed at    Patient to be transferred to  [facility] on  09/21/2013 Patient to be transferred to facility by [facility]  The following physician request were entered in Epic:   Additional Comments:  [place], [place]

## 2013-09-19 NOTE — Care Management Note (Signed)
CARE MANAGEMENT NOTE 09/19/2013  Patient:  [name] [name], [name] [name]   Account Number:  [id number]  Date Initiated:  09/18/2013  Documentation initiated by:  Ricki Miller  Subjective/Objective Assessment:   73 yr old female s/p right total knee arthroplasty.     Action/Plan:   Patient preoperatively setup with [facility]. CM waiting PT/OT eval.  patient will require shortterm rehab at [facility]. Social worker is aware.   Anticipated DC Date:  09/21/2013   Anticipated DC Plan:  SKILLED NURSING FACILITY  In-house referral  Clinical Social Worker      DC Planning Services  CM consult      Choice offered to / List presented to:             Status of service:  Completed, signed off  Discharge Disposition:  [place]

## 2013-09-19 NOTE — Progress Notes (Signed)
Clinical Social Work Department BRIEF PSYCHOSOCIAL ASSESSMENT 09/19/2013  Patient:  LAMERLE, JABS     Account Number:  [id number]     Admit date:  09/18/2013  Clinical Social Worker:  Adair Laundry  Date/Time:  09/19/2013 11:40 AM  Referred by:  Physician  Date Referred:  09/19/2013 Referred for  SNF Placement   Other Referral:   Interview type:  Patient Other interview type:   Spoke with pt and pt family at bedside    PSYCHOSOCIAL DATA Living Status:  HUSBAND Admitted from facility:   Level of care:   Primary support name:  [place] [phone] Primary support relationship to patient:  SPOUSE Degree of support available:   Pt has very supportive family    CURRENT CONCERNS Current Concerns  Post-Acute Placement   Other Concerns:    SOCIAL WORK ASSESSMENT / PLAN CSW made aware of PT recommendation for SNF. CSW spoke with pt and pt family who were already aware of recommendation. Pt has spoken with both [facility] in [place] and prefers those two facilities respectively. CSW explained referal and insurance process. Pt stated she was already informed of insurance referal and that is may be timely. Pt is happy to dc to SNF in order to get the rehab she needs.   Assessment/plan status:  Psychosocial Support/Ongoing Assessment of Needs Other assessment/ plan:   Information/referral to community resources:   SNF list not needed    PATIENT'S/FAMILY'S RESPONSE TO PLAN OF CARE: Pt is agreeable to SNF       [place], [place]

## 2013-09-19 NOTE — Evaluation (Signed)
Physical Therapy Evaluation Patient Details Name: [name] [name] MRN: [id number] DOB: 04-Mar-1941 Today's Date: 09/19/2013 Time: 4742-5956 PT Time Calculation (min): 41 min  PT Assessment / Plan / Recommendation History of Present Illness  S/p RTKA  Clinical Impression  Pt is s/p TKA resulting in the deficits listed below (see PT Problem List).  Pt will benefit from skilled PT to increase their independence and safety with mobility to allow discharge to the venue listed below.      PT Assessment  Patient needs continued PT services    Follow Up Recommendations  SNF    Does the patient have the potential to tolerate intense rehabilitation      Barriers to Discharge        Equipment Recommendations  Rolling walker with 5" wheels;3in1 (PT)    Recommendations for Other Services     Frequency 7X/week    Precautions / Restrictions Precautions Precautions: Knee Restrictions RLE Weight Bearing: Partial weight bearing RLE Partial Weight Bearing Percentage or Pounds: 50%   Pertinent Vitals/Pain 4/10 pain R knee patient repositioned for comfort and optimal knee ext      Mobility  Bed Mobility Overal bed mobility: Needs Assistance Bed Mobility: Supine to Sit Supine to sit: Min assist General bed mobility comments: Cues for technique Transfers Overall transfer level: Needs assistance Equipment used: Rolling walker (2 wheeled) Transfers: Sit to/from Stand Sit to Stand: Min assist General transfer comment: Cues for technique, safety, hand placement, and PWB Ambulation/Gait Ambulation/Gait assistance: Min assist Ambulation Distance (Feet): 25 Feet Assistive device: Rolling walker (2 wheeled) Gait Pattern/deviations: Step-to pattern General Gait Details: Cues for gait sequence, and to bear weight through UEs for 50%PWB; tends to stand with R knee slightly flexed; cued to activate quad    Exercises Total Joint Exercises Ankle Circles/Pumps: PROM;Right;10 reps Quad Sets:  AROM;Right;20 reps Heel Slides: AAROM;Right;5 reps Straight Leg Raises: AAROM;Right;5 reps   PT Diagnosis: Difficulty walking;Acute pain  PT Problem List: Decreased strength;Decreased range of motion;Decreased activity tolerance;Decreased knowledge of use of DME;Decreased knowledge of precautions;Pain PT Treatment Interventions: DME instruction;Gait training;Stair training;Functional mobility training;Therapeutic activities;Therapeutic exercise;Patient/family education     PT Goals(Current goals can be found in the care plan section) Acute Rehab PT Goals Patient Stated Goal: Back to her garden PT Goal Formulation: With patient Time For Goal Achievement: 09/26/13 Potential to Achieve Goals: Good  Visit Information  Last PT Received On: 09/19/13 Assistance Needed: +1 History of Present Illness: S/p RTKA       Prior Functioning  Home Living Family/patient expects to be discharged to:: Skilled nursing facility Living Arrangements: Spouse/significant other Prior Function Level of Independence: Independent Communication Communication: No difficulties    Cognition  Cognition Arousal/Alertness: Awake/alert Behavior During Therapy: WFL for tasks assessed/performed Overall Cognitive Status: Within Functional Limits for tasks assessed    Extremity/Trunk Assessment Upper Extremity Assessment Upper Extremity Assessment: Overall WFL for tasks assessed Lower Extremity Assessment Lower Extremity Assessment: RLE deficits/detail RLE Deficits / Details: Grossly decr AROM and strength. kimited by pain postop   Balance    End of Session PT - End of Session Equipment Utilized During Treatment: Gait belt Activity Tolerance: Patient tolerated treatment well Patient left: in chair;with call bell/phone within reach;with family/visitor present Nurse Communication: Mobility status  GP     Quin Hoop [place], [place]  09/19/2013, 12:10 PM

## 2013-09-19 NOTE — Progress Notes (Signed)
Patient ID: Berlynn Warsame Ausborn, female   DOB: 07-02-1941, 73 y.o.   MRN: [id number] PATIENT ID: Veverly Larimer Schoffstall        MRN:  [id number]          DOB/AGE: January 27, 1941 / 73 y.o.    [name], MD   Biagio Borg, PA-C [address] [place], [place]  [address]                             [phone]   PROGRESS NOTE  Subjective:  negative for Chest Pain  negative for Shortness of Breath  negative for Nausea/Vomiting   positive for Calf Pain-no thigh pain,pain diffusely in leg, but without swelling, n/v intact    Tolerating Diet: yes         Patient reports pain as mild.     Good night with minimal pain  Objective: Vital signs in last 24 hours:   Patient Vitals for the past 24 hrs:  BP Temp Temp src Pulse Resp SpO2 Height Weight  09/19/13 0400 - - - - 17 97 % - -  09/19/13 0355 117/44 mmHg 98.4 F (36.9 C) Oral 69 20 98 % - -  09/19/13 0129 118/48 mmHg 98.3 F (36.8 C) Oral 60 18 98 % - -  09/18/13 2359 - - - - 17 97 % - -  09/18/13 2046 125/57 mmHg 98 F (36.7 C) Oral 51 18 97 % - -  09/18/13 2000 - - - - 17 - - -  09/18/13 1600 - - - - 16 99 % - -  09/18/13 1415 - 97.5 F (36.4 C) - 46 22 99 % 5\' 6"  (1.676 m) 75.6 kg (166 lb 10.7 oz)  09/18/13 1400 - - - 50 14 98 % - -  09/18/13 1345 - - - 48 16 98 % - -  09/18/13 1330 - - - 47 13 97 % - -  09/18/13 1315 - - - 48 11 97 % - -  09/18/13 1300 - - - 47 14 97 % - -  09/18/13 1245 - - - 48 15 96 % - -  09/18/13 1230 - - - 50 16 98 % - -  09/18/13 1215 - - - 49 13 95 % - -  09/18/13 1205 - - - 55 22 98 % - -  09/18/13 1200 - - - 46 9 96 % - -  09/18/13 1145 - - - 51 11 98 % - -  09/18/13 1130 - - - 47 13 96 % - -  09/18/13 1115 - - - 49 13 96 % - -  09/18/13 1100 - - - 49 9 94 % - -  09/18/13 1045 130/50 mmHg 97.9 F (36.6 C) - 46 14 96 % - -  09/18/13 1030 124/49 mmHg - - 49 16 97 % - -  09/18/13 1015 135/54 mmHg - - 49 16 96 % - -  09/18/13 1000 123/47 mmHg - - 53 14 100 % - -  09/18/13 0945 125/64 mmHg - - 57 16 96 % - -    09/18/13 0938 146/66 mmHg 97.6 F (36.4 C) - - 16 92 % - -      Intake/Output from previous day:   02/03 0701 - 02/04 0700 In: 1100 [I.V.:1100] Out: 390 [Drains:315]   Intake/Output this shift:   02/04 0701 - 02/04 1900 In: 1475.6 [P.O.:250; I.V.:925.6] Out: -  Intake/Output     02/03 0701 - 02/04 0700 02/04 0701 - 02/05 0700   P.O.  250   I.V. (mL/kg) 1100 (14.6) 925.6 (12.2)   IV Piggyback  300   Total Intake(mL/kg) 1100 (14.6) 1475.6 (19.5)   Drains 315    Blood 75    Total Output 390     Net +710 +1475.6           LABORATORY DATA:  Recent Labs  09/19/13 0716  WBC 7.0  HGB 10.0*  HCT 29.0*  PLT 154   No results found for this basename: NA, K, CL, CO2, BUN, CREATININE, GLUCOSE, CALCIUM,  in the last 168 hours Lab Results  Component Value Date   INR 0.97 09/18/2013   INR 1.44 09/05/2013   INR 1.42 07/24/2013    Recent Radiographic Studies :  No results found.   Examination:  General appearance: alert, cooperative and no distress  Wound Exam: clean, dry, intact   Drainage:  Large amount Serosanguinous exudate-125 cc in hemovac this am  Motor Exam: EHL, FHL, Anterior Tibial and Posterior Tibial Intact  Sensory Exam: Superficial Peroneal, Deep Peroneal and Tibial normal  Vascular Exam: Normal  Assessment:    1 Day Post-Op  Procedure(s) (LRB): TOTAL KNEE ARTHROPLASTY (Right)  ADDITIONAL DIAGNOSIS:  Active Problems:   S/P total knee replacement using cement  Acute Blood Loss Anemia-asymptomatic   Plan: Physical Therapy as ordered Partial Weight Bearing @ 50% (PWB)  DVT Prophylaxis:  Aspirin,pradaxa  DISCHARGE PLAN: Skilled Nursing Facility/Rehab  DISCHARGE NEEDS: HHPT, CPM, Walker and 3-in-1 comode seat   monitor calf pain, on ASA and pradaxa as pre op, OOB with PT, social services to check on rehab facilities,saline lock IV      [name] W 09/19/2013 8:04 AM

## 2013-09-20 ENCOUNTER — Inpatient Hospital Stay (HOSPITAL_COMMUNITY): Payer: Medicare PPO

## 2013-09-20 DIAGNOSIS — M7989 Other specified soft tissue disorders: Secondary | ICD-10-CM

## 2013-09-20 DIAGNOSIS — M79609 Pain in unspecified limb: Secondary | ICD-10-CM

## 2013-09-20 LAB — CBC
HCT: 33.9 % — ABNORMAL LOW (ref 36.0–46.0)
HEMOGLOBIN: 11.6 g/dL — AB (ref 12.0–15.0)
MCH: 33.8 pg (ref 26.0–34.0)
MCHC: 34.2 g/dL (ref 30.0–36.0)
MCV: 98.8 fL (ref 78.0–100.0)
Platelets: 194 10*3/uL (ref 150–400)
RBC: 3.43 MIL/uL — AB (ref 3.87–5.11)
RDW: 13 % (ref 11.5–15.5)
WBC: 9.4 10*3/uL (ref 4.0–10.5)

## 2013-09-20 LAB — BASIC METABOLIC PANEL
BUN: 9 mg/dL (ref 6–23)
CALCIUM: 8.7 mg/dL (ref 8.4–10.5)
CO2: 30 meq/L (ref 19–32)
Chloride: 93 mEq/L — ABNORMAL LOW (ref 96–112)
Creatinine, Ser: 0.66 mg/dL (ref 0.50–1.10)
GFR calc Af Amer: >90 mL/min (ref >90)
GFR calc non Af Amer: 86 mL/min — ABNORMAL LOW (ref >90)
GLUCOSE: 128 mg/dL — AB (ref 70–99)
POTASSIUM: 4 meq/L (ref 3.7–5.3)
Sodium: 135 mEq/L — ABNORMAL LOW (ref 137–147)

## 2013-09-20 NOTE — Progress Notes (Signed)
Orthopedic Tech Progress Note Patient Details:  [name] [name] 14-Oct-1940 [id number]  Patient ID: [name] [name], female   DOB: Dec 28, 1940, 73 y.o.   MRN: [id number] Placed pt's rle in cpm @ 0-60 degrees @ 2140  Hildred Priest 09/20/2013, 9:44 PM

## 2013-09-20 NOTE — Progress Notes (Signed)
Patient has temp of 101.4 MD made aware ,order meds Tylenol  and spirometer incentive at bedside and  encouraged to use as ordered

## 2013-09-20 NOTE — Progress Notes (Signed)
Orthopedic Tech Progress Note Patient Details:  [name] [name] 08-07-1941 [id number] Patient already in [facility] for 1500 rounds Patient ID: [name], female   DOB: [birth year], 73 y.o.   MRN: [id number]   Fenton Foy 09/20/2013, 2:40 PM

## 2013-09-20 NOTE — Progress Notes (Signed)
Patient ID: [name] [name], female   DOB: 02/05/1941, 73 y.o.   MRN: [id number] PATIENT ID: [name] [name]        MRN:  [id number]          DOB/AGE: [birth year] / 73 y.o.    [name], MD   Biagio Borg, PA-C [address] [place], [place]  [address]                             [phone]   PROGRESS NOTE  Subjective:  negative for Chest Pain  negative for Shortness of Breath  negative for Nausea/Vomiting   positive for Calf Pain    Tolerating Diet: yes         Patient reports pain as moderate.       Objective: Vital signs in last 24 hours:   Patient Vitals for the past 24 hrs:  BP Temp Temp src Pulse Resp SpO2  09/20/13 0708 136/57 mmHg 101.4 F (38.6 C) Oral 95 16 96 %  09/20/13 0400 - - - - 16 -  09/20/13 0000 - - - - 16 -  09/19/13 2013 128/63 mmHg 100.1 F (37.8 C) Oral 69 18 98 %  09/19/13 2000 - - - - 16 -  09/19/13 1515 - - - - 18 97 %  09/19/13 1345 105/42 mmHg 98.6 F (37 C) - 52 16 96 %  09/19/13 1146 - - - - 18 96 %      Intake/Output from previous day:   02/04 0701 - 02/05 0700 In: 2435.6 [P.O.:1210; I.V.:925.6] Out: -    Intake/Output this shift:       Intake/Output     02/04 0701 - 02/05 0700 02/05 0701 - 02/06 0700   P.O. 1210    I.V. (mL/kg) 925.6 (12.2)    IV Piggyback 300    Total Intake(mL/kg) 2435.6 (32.2)    Drains     Blood     Total Output       Net +2435.6          Urine Occurrence 5 x       LABORATORY DATA:  Recent Labs  09/19/13 0716 09/20/13 0620  WBC 7.0 9.4  HGB 10.0* 11.6*  HCT 29.0* 33.9*  PLT 154 194    Recent Labs  09/19/13 0716 09/20/13 0620  NA 135* 135*  K 3.6* 4.0  CL 96 93*  CO2 29 30  BUN 17 9  CREATININE 0.64 0.66  GLUCOSE 124* 128*  CALCIUM 7.9* 8.7   Lab Results  Component Value Date   INR 0.97 09/18/2013   INR 1.44 09/05/2013   INR 1.42 07/24/2013    Recent Radiographic Studies :  No results found.   Examination:  General appearance: alert, cooperative and no distress  Wound  Exam: draining around hemovac tubing  Drainage:  Scant/small amount Serosanguinous exudate  Motor Exam: EHL, FHL, Anterior Tibial and Posterior Tibial Intact  Sensory Exam: Superficial Peroneal, Deep Peroneal and Tibial normal  Vascular Exam: Normal  Assessment:    2 Days Post-Op  Procedure(s) (LRB): TOTAL KNEE ARTHROPLASTY (Right)  ADDITIONAL DIAGNOSIS:  Active Problems:   S/P total knee replacement using cement  Acute Blood Loss Anemia-asymptomatic   Plan: Physical Therapy as ordered Partial Weight Bearing @ 50% (PWB)  DVT Prophylaxis:  pradaxa  DISCHARGE PLAN: Skilled Nursing Facility/Rehab  DISCHARGE NEEDS: HHRN, CPM, Walker and 3-in-1 comode seat   dressing changed with sterile  technique,still with calf pain-will order Doppler, hemovac D/C'd,elevated temp without SOB or chest pain, no urinary problems-will monitor and encourage OOB with respirometer, await SNF/rehab with medically stable     Tephanie Escorcia W 09/20/2013 10:20 AM

## 2013-09-20 NOTE — Progress Notes (Signed)
Physical Therapy Treatment Patient Details Name: [name] [name] MRN: [id number] DOB: 1941-05-31 Today's Date: 09/20/2013 Time: 2992-4268 PT Time Calculation (min): 24 min  PT Assessment / Plan / Recommendation  History of Present Illness S/p RTKA   PT Comments   Pt reports she has a temp. and is agreeable to do some exercises and walk to the chair. Pt is slowly progressing with therapy and continue to recommend SNF for continued PT to maximize independence.  Follow Up Recommendations  SNF     Does the patient have the potential to tolerate intense rehabilitation     Barriers to Discharge        Equipment Recommendations  None recommended by PT    Recommendations for Other Services    Frequency 7X/week   Progress towards PT Goals Progress towards PT goals: Progressing toward goals  Plan Current plan remains appropriate    Precautions / Restrictions Precautions Precautions: Knee;Fall Restrictions Weight Bearing Restrictions: Yes RLE Weight Bearing: Partial weight bearing RLE Partial Weight Bearing Percentage or Pounds: 50%   Pertinent Vitals/Pain     Mobility  Bed Mobility Overal bed mobility: Needs Assistance Bed Mobility: Supine to Sit Supine to sit: Min assist General bed mobility comments: min assist to lift LE Transfers Overall transfer level: Needs assistance Equipment used: Rolling walker (2 wheeled) Transfers: Sit to/from Stand Sit to Stand: Min assist General transfer comment: cues for hand placement for increased safety. Ambulation/Gait Ambulation/Gait assistance: Min assist Ambulation Distance (Feet): 6 Feet (agreeable to walk to chair only) Assistive device: Rolling walker (2 wheeled) Gait Pattern/deviations: Step-to pattern;Decreased stride length Gait velocity: decreased Gait velocity interpretation: Below normal speed for age/gender General Gait Details: cues to use UE more to assist with foot clearance on left. Pt drags and scoots left foot.     Exercises Total Joint Exercises Ankle Circles/Pumps: AROM;Strengthening;Both;10 reps Hip ABduction/ADduction: AAROM;Strengthening;Right;10 reps;Supine Straight Leg Raises: AAROM;Strengthening;Right;10 reps;Supine Knee Flexion: AAROM;Strengthening;Right;10 reps Goniometric ROM: 0-50   PT Diagnosis:    PT Problem List:   PT Treatment Interventions:     PT Goals (current goals can now be found in the care plan section)    Visit Information  Last PT Received On: 09/20/13 Assistance Needed: +1 History of Present Illness: S/p RTKA    Subjective Data      Cognition  Cognition Arousal/Alertness: Awake/alert Behavior During Therapy: WFL for tasks assessed/performed Overall Cognitive Status: Within Functional Limits for tasks assessed    Balance  Balance Overall balance assessment: Needs assistance  End of Session PT - End of Session Equipment Utilized During Treatment: Gait belt Activity Tolerance: Patient tolerated treatment well Patient left: in chair;with call bell/phone within reach Nurse Communication: Mobility status CPM Right Knee CPM Right Knee: Off   GP     Theodoro Grist [facility] 09/20/2013, 12:18 PM

## 2013-09-20 NOTE — Progress Notes (Signed)
VASCULAR LAB PRELIMINARY  PRELIMINARY  PRELIMINARY  PRELIMINARY  Right lower extremity venous duplex  completed.    Preliminary report: No obvious evidence of deep or superficial vein thrombosis noted in right leg.    Rich Paprocki, RVT 09/20/2013, 5:24 PM

## 2013-09-21 ENCOUNTER — Encounter (HOSPITAL_COMMUNITY): Payer: Self-pay | Admitting: Orthopaedic Surgery

## 2013-09-21 ENCOUNTER — Inpatient Hospital Stay
Admission: RE | Admit: 2013-09-21 | Discharge: 2013-10-10 | Disposition: A | Discharge status: Home / Self Care | Payer: Medicare PPO | Source: Ambulatory Visit | Attending: Internal Medicine | Admitting: Internal Medicine

## 2013-09-21 DIAGNOSIS — M1711 Unilateral primary osteoarthritis, right knee: Secondary | ICD-10-CM | POA: Diagnosis present

## 2013-09-21 LAB — CBC
HCT: 29.9 % — ABNORMAL LOW (ref 36.0–46.0)
HEMOGLOBIN: 10.4 g/dL — AB (ref 12.0–15.0)
MCH: 34 pg (ref 26.0–34.0)
MCHC: 34.8 g/dL (ref 30.0–36.0)
MCV: 97.7 fL (ref 78.0–100.0)
Platelets: 188 10*3/uL (ref 150–400)
RBC: 3.06 MIL/uL — AB (ref 3.87–5.11)
RDW: 12.7 % (ref 11.5–15.5)
WBC: 8.2 10*3/uL (ref 4.0–10.5)

## 2013-09-21 LAB — BASIC METABOLIC PANEL
BUN: 9 mg/dL (ref 6–23)
CO2: 31 meq/L (ref 19–32)
CREATININE: 0.58 mg/dL (ref 0.50–1.10)
Calcium: 9.1 mg/dL (ref 8.4–10.5)
Chloride: 94 mEq/L — ABNORMAL LOW (ref 96–112)
GFR calc Af Amer: >90 mL/min (ref >90)
GFR calc non Af Amer: 90 mL/min — ABNORMAL LOW (ref >90)
GLUCOSE: 112 mg/dL — AB (ref 70–99)
Potassium: 4.6 mEq/L (ref 3.7–5.3)
SODIUM: 135 meq/L — AB (ref 137–147)

## 2013-09-21 MED ORDER — OXYCODONE HCL 5 MG PO TABS: 5.0000 mg | ORAL_TABLET | ORAL | Status: DC | PRN

## 2013-09-21 MED ORDER — BISACODYL 10 MG RE SUPP: 10.0000 mg | Freq: Every day | RECTAL | Status: DC | PRN

## 2013-09-21 MED ORDER — METHOCARBAMOL 500 MG PO TABS: 500.0000 mg | ORAL_TABLET | Freq: Three times a day (TID) | ORAL | Status: DC | PRN

## 2013-09-21 NOTE — Progress Notes (Signed)
Physical Therapy Treatment Patient Details Name: [name] [name] MRN: [id number] DOB: Feb 27, 1941 Today's Date: 09/21/2013 Time: 5462-7035 PT Time Calculation (min): 27 min  PT Assessment / Plan / Recommendation  History of Present Illness S/p RTKA   PT Comments   Pt c/o dizziness with sit to stand this session, eased with standing x 30 seconds.  Pt encouraged to make sure she is not dizzy before attempting gait or transfers.  Pt improved mobility and gait distance.  Still requires assist for bed mobility due to R LE weakness.  Follow Up Recommendations  SNF     Does the patient have the potential to tolerate intense rehabilitation     Barriers to Discharge        Equipment Recommendations  None recommended by PT    Recommendations for Other Services    Frequency 7X/week   Progress towards PT Goals Progress towards PT goals: Progressing toward goals  Plan Current plan remains appropriate    Precautions / Restrictions Precautions Precautions: Knee;Fall Restrictions RLE Weight Bearing: Partial weight bearing RLE Partial Weight Bearing Percentage or Pounds: 50%   Pertinent Vitals/Pain Pt c/o "soreness" in R knee after session, ice applied.    Mobility  Bed Mobility Bed Mobility: Supine to Sit Supine to sit: Min assist General bed mobility comments: assist to lift LE Transfers Equipment used: Rolling walker (2 wheeled) Sit to Stand: Min assist General transfer comment: pt needs lifting assist to stand from bed, and [facility] Ambulation/Gait Ambulation/Gait assistance: Supervision Ambulation Distance (Feet): 20 Feet (20' x 2) Assistive device: Rolling walker (2 wheeled) General Gait Details: cues for posture and foot clearance on L    Exercises Total Joint Exercises Ankle Circles/Pumps: PROM;Both;15 reps Quad Sets: AROM;Right;20 reps Heel Slides: AAROM;Right;10 reps Hip ABduction/ADduction: AAROM;Right;20 reps   PT Diagnosis:    PT Problem List:   PT Treatment Interventions:      PT Goals (current goals can now be found in the care plan section)    Visit Information  Last PT Received On: 09/21/13 Assistance Needed: +1 History of Present Illness: S/p RTKA    Subjective Data      Cognition  Cognition Arousal/Alertness: Awake/alert Behavior During Therapy: WFL for tasks assessed/performed Overall Cognitive Status: Within Functional Limits for tasks assessed    Balance     End of Session PT - End of Session Activity Tolerance: Patient tolerated treatment well Patient left: in chair;with call bell/phone within reach Nurse Communication: Mobility status   GP     [name] [name] 09/21/2013, 2:06 PM

## 2013-09-21 NOTE — Progress Notes (Signed)
Patient ID: [name] [name], female   DOB: 22-Jun-1941, 73 y.o.   MRN: [id number] PATIENT ID: [name] [name]        MRN:  [id number]          DOB/AGE: [birth year] / 73 y.o.    [name], MD   Biagio Borg, PA-C [address] [place], [place]  [address]                             [phone]   PROGRESS NOTE  Subjective:  negative for Chest Pain  negative for Shortness of Breath  negative for Nausea/Vomiting   positive for Calf Pain-much better    Tolerating Diet: yes         Patient reports pain as mild.     OOB with PT this am-excellent progress, Doppler neg for DVT, CXR clear  Objective: Vital signs in last 24 hours:   Patient Vitals for the past 24 hrs:  BP Temp Temp src Pulse Resp SpO2  09/21/13 0702 134/68 mmHg 98.2 F (36.8 C) Oral 83 16 95 %  09/21/13 0400 - - - - 16 -  09/21/13 0216 142/64 mmHg 98.5 F (36.9 C) Oral 89 18 96 %  09/21/13 0000 - - - - 16 -  09/20/13 2106 138/87 mmHg 98.4 F (36.9 C) Oral 98 20 99 %  09/20/13 2000 - - - - 16 -  09/20/13 1959 151/80 mmHg 99.3 F (37.4 C) Oral 60 18 98 %  09/20/13 1753 109/59 mmHg 99.4 F (37.4 C) - 56 16 94 %  09/20/13 1541 - - - - 16 95 %  09/20/13 1200 - - - - 18 96 %      Intake/Output from previous day:   02/05 0701 - 02/06 0700 In: 480 [P.O.:480] Out: -    Intake/Output this shift:       Intake/Output     02/05 0701 - 02/06 0700 02/06 0701 - 02/07 0700   P.O. 480    I.V. (mL/kg)     IV Piggyback     Total Intake(mL/kg) 480 (6.3)    Net +480          Urine Occurrence 1 x       LABORATORY DATA:  Recent Labs  09/19/13 0716 09/20/13 0620 09/21/13 0342  WBC 7.0 9.4 8.2  HGB 10.0* 11.6* 10.4*  HCT 29.0* 33.9* 29.9*  PLT 154 194 188    Recent Labs  09/19/13 0716 09/20/13 0620 09/21/13 0342  NA 135* 135* 135*  K 3.6* 4.0 4.6  CL 96 93* 94*  CO2 29 30 31   BUN 17 9 9   CREATININE 0.64 0.66 0.58  GLUCOSE 124* 128* 112*  CALCIUM 7.9* 8.7 9.1   Lab Results  Component Value Date   INR 0.97 09/18/2013   INR 1.44 09/05/2013   INR 1.42 07/24/2013    Recent Radiographic Studies :  Dg Chest 2 View  09/20/2013   CLINICAL DATA:  Cough  EXAM: CHEST  2 VIEW  COMPARISON:  July 24, 2013  FINDINGS: The heart size and mediastinal contours are within normal limits. There is no focal infiltrate, pulmonary edema, or pleural effusion. Vessel on end is identified in the right suprahilar region. The visualized skeletal structures are stable.  IMPRESSION: No active cardiopulmonary disease.   Electronically Signed   By: Abelardo Diesel M.D.   On: 09/20/2013 11:50     Examination:  General appearance:  alert, cooperative and no distress  Wound Exam: clean, dry, intact   Drainage:  None: wound tissue dry  Motor Exam: EHL, FHL, Anterior Tibial and Posterior Tibial Intact  Sensory Exam: Superficial Peroneal, Deep Peroneal and Tibial normal  Vascular Exam: Normal  Assessment:    3 Days Post-Op  Procedure(s) (LRB): TOTAL KNEE ARTHROPLASTY (Right)  ADDITIONAL DIAGNOSIS:  Principal Problem:   Osteoarthritis of right knee Active Problems:   S/P total knee replacement using cement  Acute Blood Loss Anemia-stable, asymptomatic   Plan: Physical Therapy as ordered Partial Weight Bearing @ 50% (PWB)  DVT Prophylaxis:  pradaxa  DISCHARGE PLAN: Skilled Nursing Facility/Rehab  DISCHARGE NEEDS: HHPT, CPM, Walker and 3-in-1 comode seat     D/C to [place] facility today, office in 2 weeks-doing well    [name] W 09/21/2013 8:07 AM

## 2013-09-21 NOTE — Discharge Summary (Signed)
[name], MD   Biagio Borg, PA-C [address] [place], [place]  [address]                             [phone]  PATIENT ID: [name] [name]        MRN:  [id number]          DOB/AGE: [birth year] / 73 y.o.    DISCHARGE SUMMARY  ADMISSION DATE:    09/18/2013 DISCHARGE DATE:   09/21/2013   ADMISSION DIAGNOSIS: RIGHT KNEE OSTEOARTHRITIS    DISCHARGE DIAGNOSIS:  RIGHT KNEE OSTEOARTHRITIS    ADDITIONAL DIAGNOSIS: Principal Problem:   Osteoarthritis of right knee Active Problems:   S/P total knee replacement using cement  Past Medical History  Diagnosis Date  . Hyperlipidemia     was on Pravastatin but joint pain;has been off for 2-51months ;takes CoQ10  . Allergic rhinitis     takes Benadryl at bedtime  . Cancer of skin of leg   . History of poliomyelitis     Polio  age 39- no significant neuromuscular deficit  . Collagenous colitis     takes Budesonide daily as needed   . Potassium deficiency     takes KDUR daily  . Arthritis   . GERD (gastroesophageal reflux disease)     takes Omeprazole daily  . Atrial fibrillation 06-10-2011    takes Diltiazem and Pradaxa daily  . Hypertension     takes Bisoprolol and Chlorthalidone daily  . Cough     states every day of her life and every chest xray is always clear  . History of bronchitis     last time about 17yrs ago  . Pneumonia     as a child  . Joint pain   . Diarrhea     immodium daily  . History of colon polyps   . Anemia     hx of    PROCEDURE: Procedure(s): TOTAL KNEE ARTHROPLASTY Right on 09/18/2013  CONSULTS: none     HISTORY: [name] is a 73 year old white female who is seen today for evaluation of her right knee. She has had chronic problems with her right knee and this dates back to almost a year ago in February when she was seen with a 2-week history of pain along the medial aspect of her knee. She started having pain and discomfort, because she had to clean a house at that time and started  having some sensation of pain along the medial aspect of the knee and started having a limping episode. She tried conservative treatment, but this did not work. At that time she had been noted radiographically to have multiple loose bodies present as well as spurring. She also had patellofemoral OA. She did have a corticosteroid injection at that time and did not have long lasting from the corticosteroid and so we felt that possibly re-injection was indicated. She did have a corticosteroid injection then in April 2014 to the right knee and because of her Pradaxa we did call her cardiologist, but apparently there was not an appropriate medication to use with Pradaxa. We did start Hyalgan injections in May 2014. She did have continued pain with activities even after her viscosupplementation. It got to the point where she could not walk, exercise or actually do her activities of daily living. She is also complaining of swelling in the knee. She also has some sensation as if there are things floating  in her knee. She has nighttime pain as well as pain with activities of daily living. Her symptoms have worsened to the point now where she returns for re-evaluation.    HOSPITAL COURSE:  [name] [name] is a 73 y.o. admitted on 09/18/2013 and found to have a diagnosis of [place].  After appropriate laboratory studies were obtained  they were taken to the operating room on 09/18/2013 and underwent  Procedure(s): TOTAL KNEE ARTHROPLASTY  Right.   They were given perioperative antibiotics:  Anti-infectives   Start     Dose/Rate Route Frequency Ordered Stop   09/18/13 1600  ceFAZolin (ANCEF) IVPB 2 g/50 mL premix     2 g 100 mL/hr over 30 Minutes Intravenous Every 6 hours 09/18/13 1436 09/18/13 2303   09/18/13 0600  ceFAZolin (ANCEF) IVPB 2 g/50 mL premix     2 g 100 mL/hr over 30 Minutes Intravenous On call to O.R. 09/17/13 1405 09/18/13 1062    .  Tolerated the procedure well.  Toradol was given  post op.  POD #1, allowed out of bed to a chair.  PT for ambulation and exercise program.    IV saline locked.  O2 discontionued.  POD #2, continued PT and ambulation.   Hemovac pulled. Doppler was ordered because of calf pain which was negative.  Temp to 101.4 which resolved.  Chest X-Ray negative.  Denied dysuria . POD #3, continued PT.  Discharged to [place] facility  The remainder of the hospital course was dedicated to ambulation and strengthening.   The patient was discharged on 3 Days Post-Op in  Stable condition.  Blood products given:none  DIAGNOSTIC STUDIES: Recent vital signs: Patient Vitals for the past 24 hrs:  BP Temp Temp src Pulse Resp SpO2  09/21/13 0702 134/68 mmHg 98.2 F (36.8 C) Oral 83 16 95 %  09/21/13 0400 - - - - 16 -  09/21/13 0216 142/64 mmHg 98.5 F (36.9 C) Oral 89 18 96 %  09/21/13 0000 - - - - 16 -  09/20/13 2106 138/87 mmHg 98.4 F (36.9 C) Oral 98 20 99 %  09/20/13 2000 - - - - 16 -  09/20/13 1959 151/80 mmHg 99.3 F (37.4 C) Oral 60 18 98 %  09/20/13 1753 109/59 mmHg 99.4 F (37.4 C) - 56 16 94 %  09/20/13 1541 - - - - 16 95 %  09/20/13 1200 - - - - 18 96 %       Recent laboratory studies:  Recent Labs  09/19/13 0716 09/20/13 0620 09/21/13 0342  WBC 7.0 9.4 8.2  HGB 10.0* 11.6* 10.4*  HCT 29.0* 33.9* 29.9*  PLT 154 194 188    Recent Labs  09/19/13 0716 09/20/13 0620 09/21/13 0342  NA 135* 135* 135*  K 3.6* 4.0 4.6  CL 96 93* 94*  CO2 29 30 31   BUN 17 9 9   CREATININE 0.64 0.66 0.58  GLUCOSE 124* 128* 112*  CALCIUM 7.9* 8.7 9.1   Lab Results  Component Value Date   INR 0.97 09/18/2013   INR 1.44 09/05/2013   INR 1.42 07/24/2013     Recent Radiographic Studies :  Dg Chest 2 View  09/20/2013   CLINICAL DATA:  Cough  EXAM: CHEST  2 VIEW  COMPARISON:  July 24, 2013  FINDINGS: The heart size and mediastinal contours are within normal limits. There is no focal infiltrate, pulmonary edema, or pleural effusion. Vessel on  end is identified in the right suprahilar  region. The visualized skeletal structures are stable.  IMPRESSION: No active cardiopulmonary disease.   Electronically Signed   By: Abelardo Diesel M.D.   On: 09/20/2013 11:50    DISCHARGE INSTRUCTIONS: Discharge Orders   Future Appointments Provider Department Dept Phone   11/20/2013 1:00 PM Stacy Gardner, PA-C [place] [phone]   Future Orders Complete By Expires   Call MD / Call 911  As directed    Comments:     If you experience chest pain or shortness of breath, CALL 911 and be transported to the hospital emergency room.  If you develop a fever above 101 F, pus (white drainage) or increased drainage or redness at the wound, or calf pain, call your surgeon's office.   Change dressing  As directed    Comments:     Change dressing on SATURDAY, then change the dressing daily with sterile 4 x 4 inch gauze dressing and apply TED hose.  You may clean the incision with alcohol prior to redressing.   Constipation Prevention  As directed    Comments:     Drink plenty of fluids.  Prune juice and/or coffee may be helpful.  You may use a stool softener, such as Colace (over the counter) 100 mg twice a day.  Use MiraLax (over the counter) for constipation as needed but this may take several days to work.  Mag Citrate --OR-- Milk of Magnesia --OR -- Dulcolax pills/suppositories may also be used but follow directions on the label.   CPM  As directed    Comments:     Continuous passive motion machine (CPM):      Use the CPM from 0 to 60 for 6-8 hours per day.      You may increase by 5-10 per day.  You may break it up into 2 or 3 sessions per day.      Use CPM for 3-4 weeks or until you are told to stop.   Diet general  As directed    Discharge instructions  As directed    Comments:     YOU WERE GIVEN A DEVICE CALLED AN INCENTIVE SPIROMETER TO HELP YOU TAKE DEEP BREATHS.  PLEASE USE THIS AT LEAST TEN (10) TIMES EVERY 1-2 HOURS  EVERY DAY TO PREVENT PNEUMONIA.   Do not put a pillow under the knee. Place it under the heel.  As directed    Driving restrictions  As directed    Comments:     No driving for 6 weeks   Increase activity slowly as tolerated  As directed    Lifting restrictions  As directed    Comments:     No lifting for 6 weeks   Partial weight bearing  As directed    Comments:     50 % WEIGHT BEARING AS TAUGHT IN PHYSICAL THERAPY   Questions:     % Body Weight:     Laterality:     Extremity:     Patient may shower  As directed    Comments:     You may shower over the brown dressing.  Once the dressing is removed you may shower without a dressing once there is no drainage.  Do not wash over the wound.  If drainage remains, cover wound with plastic wrap and then shower.   TED hose  As directed    Comments:     Use stockings (TED hose) for 2 weeks on operative leg(s).  You may remove them  at night for sleeping.      DISCHARGE MEDICATIONS:     Medication List         bisacodyl 10 MG suppository  Commonly known as:  DULCOLAX  Place 1 suppository (10 mg total) rectally daily as needed for moderate constipation.     bisoprolol 5 MG tablet  Commonly known as:  ZEBETA  Take 2.5 mg by mouth daily.     budesonide 3 MG 24 hr capsule  Commonly known as:  ENTOCORT EC  Take 6 mg by mouth daily as needed (for colitis flare up).     chlorthalidone 25 MG tablet  Commonly known as:  HYGROTON  Take 1 tablet (25 mg total) by mouth daily.     Co Q 10 100 MG Caps  Take 100 mg by mouth daily.     dabigatran 150 MG Caps capsule  Commonly known as:  PRADAXA  Take 1 capsule (150 mg total) by mouth every 12 (twelve) hours.     diltiazem 180 MG 24 hr capsule  Commonly known as:  CARDIZEM CD  Take 1 capsule (180 mg total) by mouth daily.     diphenhydrAMINE 50 MG capsule  Commonly known as:  BENADRYL  Take 50 mg by mouth at bedtime.     methocarbamol 500 MG tablet  Commonly known as:  ROBAXIN    Take 1 tablet (500 mg total) by mouth every 8 (eight) hours as needed for muscle spasms.     multivitamin with minerals Tabs tablet  Take 1 tablet by mouth every other day.     omeprazole 20 MG capsule  Commonly known as:  PRILOSEC  Take 1 capsule (20 mg total) by mouth daily. Please Order In Box of #42.     oxyCODONE 5 MG immediate release tablet  Commonly known as:  Oxy IR/ROXICODONE  Take 1-2 tablets (5-10 mg total) by mouth every 4 (four) hours as needed for moderate pain, severe pain or breakthrough pain.     potassium chloride 10 MEQ tablet  Commonly known as:  K-DUR  Take 1 tablet (10 mEq total) by mouth 2 (two) times daily.        FOLLOW UP VISIT:       Follow-up Information   Follow up with [facility], Vonna Kotyk, MD. Schedule an appointment as soon as possible for a visit on 10/03/2013.   Specialty:  Orthopedic Surgery   Contact information:   [address] [address] [phone]       DISPOSITION:   Skilled Nursing Facility/Rehab  CONDITION:  Stable   [name] [name] 09/21/2013, 8:12 AM

## 2013-09-21 NOTE — Progress Notes (Signed)
CSW (Clinical Education officer, museum) prepared pt [employer] and provided to pt family. Pt family to provide transportation to facility. Pt nurse and facility aware. CSW signing off.  [place], [place]

## 2013-09-24 ENCOUNTER — Encounter: Payer: Self-pay | Admitting: Internal Medicine

## 2013-09-24 ENCOUNTER — Non-Acute Institutional Stay (SKILLED_NURSING_FACILITY): Payer: Medicare PPO | Admitting: Internal Medicine

## 2013-09-24 ENCOUNTER — Other Ambulatory Visit: Payer: Self-pay | Admitting: *Deleted

## 2013-09-24 DIAGNOSIS — R29898 Other symptoms and signs involving the musculoskeletal system: Secondary | ICD-10-CM

## 2013-09-24 DIAGNOSIS — I4891 Unspecified atrial fibrillation: Secondary | ICD-10-CM

## 2013-09-24 DIAGNOSIS — Z96659 Presence of unspecified artificial knee joint: Secondary | ICD-10-CM

## 2013-09-24 MED ORDER — OXYCODONE HCL 5 MG PO TABS: ORAL_TABLET | ORAL | Status: DC

## 2013-09-24 NOTE — Telephone Encounter (Signed)
[facility] 

## 2013-09-24 NOTE — Progress Notes (Signed)
This encounter was created in error - please disregard.

## 2013-09-24 NOTE — Progress Notes (Signed)
Patient ID: [name] [name], female   DOB: 07/30/1941, 73 y.o.   MRN: [id number]  Facility; [facility] Chief complaint; admission to SNF post admit to [place] from 2/3 to 2/6  History; this is a 73 year old woman who was admitted electively for severe osteoarthritis of the right knee. She underwent a right total knee replacement. She tolerated the surgery well. She tells me that she had polio when she was 14 involving her right side. She has some weakness of the right leg to when she is fatigued causing her to drag her right leg but that she does not use an ambulatory assist device and other than osteoarthritis has been otherwise functional  Past Medical History  Diagnosis Date  . Hyperlipidemia     was on Pravastatin but joint pain;has been off for 2-68months ;takes CoQ10  . Allergic rhinitis     takes Benadryl at bedtime  . Cancer of skin of leg   . History of poliomyelitis     Polio  age 73- no significant neuromuscular deficit  . Collagenous colitis     takes Budesonide daily as needed   . Potassium deficiency     takes KDUR daily  . Arthritis   . GERD (gastroesophageal reflux disease)     takes Omeprazole daily  . Atrial fibrillation 06-10-2011    takes Diltiazem and Pradaxa daily  . Hypertension     takes Bisoprolol and Chlorthalidone daily  . Cough     states every day of her life and every chest xray is always clear  . History of bronchitis     last time about 67yrs ago  . Pneumonia     as a child  . Joint pain   . Diarrhea     immodium daily  . History of colon polyps   . Anemia     hx of    Past Surgical History  Procedure Laterality Date  . Breast biopsy  1947 & 10/07    Benign  . Tonsillectomy      as a child  . Skin cancer excision      removal of skin cancer on left leg  . Endovenous ablation saphenous vein w/ laser  03-03-2011 left greater saphenous vein   . Endovenous ablation saphenous vein w/ laser  01-06-2011  right greater saphenous vein  .  Appendectomy      at age 4  . Colonoscopy    . Epidural infection      x 2   . Cataracts removed    . Total knee arthroplasty Right 09/18/2013    Procedure: TOTAL KNEE ARTHROPLASTY;  Surgeon: Garald Balding, MD;  Location: [place];  Service: Orthopedics;  Laterality: Right;    Current Outpatient Prescriptions on File Prior to Visit  Medication Sig Dispense Refill  . bisacodyl (DULCOLAX) 10 MG suppository Place 1 suppository (10 mg total) rectally daily as needed for moderate constipation.  12 suppository  0  . bisoprolol (ZEBETA) 5 MG tablet Take 2.5 mg by mouth daily.      . budesonide (ENTOCORT EC) 3 MG 24 hr capsule Take 6 mg by mouth daily as needed (for colitis flare up).       . chlorthalidone (HYGROTON) 25 MG tablet Take 1 tablet (25 mg total) by mouth daily.  30 tablet  6  . Coenzyme Q10 (CO Q 10) 100 MG CAPS Take 100 mg by mouth daily.      . dabigatran (PRADAXA) 150 MG CAPS capsule  Take 1 capsule (150 mg total) by mouth every 12 (twelve) hours.  60 capsule  6  . diltiazem (CARDIZEM CD) 180 MG 24 hr capsule Take 1 capsule (180 mg total) by mouth daily.  30 capsule  11  . diphenhydrAMINE (BENADRYL) 50 MG capsule Take 50 mg by mouth at bedtime.      . methocarbamol (ROBAXIN) 500 MG tablet Take 1 tablet (500 mg total) by mouth every 8 (eight) hours as needed for muscle spasms.  30 tablet  0  . Multiple Vitamin (MULTIVITAMIN WITH MINERALS) TABS tablet Take 1 tablet by mouth every other day.      Marland Kitchen omeprazole (PRILOSEC) 20 MG capsule Take 1 capsule (20 mg total) by mouth daily. Please Order In Box of #42.  42 capsule  2  . oxyCODONE (OXY IR/ROXICODONE) 5 MG immediate release tablet Take 1-2 tablets (5-10 mg total) by mouth every 4 (four) hours as needed for moderate pain, severe pain or breakthrough pain.  40 tablet  0  . potassium chloride (K-DUR) 10 MEQ tablet Take 1 tablet (10 mEq total) by mouth 2 (two) times daily.  60 tablet  5   No current facility-administered medications on file  prior to visit.   Social History: Patient tells me she lives in [place] with her husband. Independent with ADLs and IADLs. No ambulatory assist devices. On pradaxa chronically for afib  reports that she has quit smoking. Her smoking use included Cigarettes. She smoked 0.00 packs per day for 18 years. She has never used smokeless tobacco. She reports that she drinks alcohol. She reports that she does not use illicit drugs.  family history includes Alcohol abuse in her father; Colon cancer in her maternal grandmother; Heart disease in her father and paternal grandfather; Hypertension in her father and sister; Other in her mother and sister; Peripheral vascular disease in her mother; Stomach cancer in her maternal grandfather; Stroke in her father.  Review of systems Respiratory does not complain of exertional shortness of Cardiac no exertional chest pain or palpitations GI has collagenous colitis causing episodic diarrhea Musculoskeletal; episodic right leg weakness when she is tired might suggest post polio.  Physical examination Gen. patient looks very well no distress Respiratory clear entry bilaterallyResults for Teska, MCKELL SCIASCIA (MRN [id number]) as of 09/24/2013 10:14  Ref. Range 09/19/2013 07:16 09/20/2013 06:20 09/20/2013 11:33 09/20/2013 17:06 09/21/2013 03:42  Sodium Latest Range: 137-147 mEq/L 135 (L) 135 (L)   135 (L)  Potassium Latest Range: 3.7-5.3 mEq/L 3.6 (L) 4.0   4.6  Chloride Latest Range: 96-112 mEq/L 96 93 (L)   94 (L)  CO2 Latest Range: 19-32 mEq/L 29 30   31   BUN Latest Range: 6-23 mg/dL 17 9   9   Creatinine Latest Range: 0.50-1.10 mg/dL 0.64 0.66   0.58  Calcium Latest Range: 8.4-10.5 mg/dL 7.9 (L) 8.7   9.1  GFR calc non Af Amer Latest Range: >90 mL/min 87 (L) 86 (L)   90 (L)  GFR calc Af Amer Latest Range: >90 mL/min >90 >90   >90  Glucose Latest Range: 70-99 mg/dL 124 (H) 128 (H)   112 (H)  WBC Latest Range: 4.0-10.5 K/uL 7.0 9.4   8.2  RBC Latest Range: 3.87-5.11 MIL/uL 2.96  (L) 3.43 (L)   3.06 (L)  Hemoglobin Latest Range: 12.0-15.0 g/dL 10.0 (L) 11.6 (L)   10.4 (L)  HCT Latest Range: 36.0-46.0 % 29.0 (L) 33.9 (L)   29.9 (L)  MCV Latest Range: 78.0-100.0 fL 98.0 98.8  97.7  MCH Latest Range: 26.0-34.0 pg 33.8 33.8   34.0  MCHC Latest Range: 30.0-36.0 g/dL 34.5 34.2   34.8  RDW Latest Range: 11.5-15.5 % 13.1 13.0   12.7  Platelets Latest Range: 150-400 K/uL 154 194   188  DG CHEST 2 VIEW No range found   Rpt    LOWER EXTREMITY VENOUS DUPLEX No range found    Rpt    Cardiac heart sounds are either a regular other frequent PACs there is no murmurs JVP is borderline at 45 Abdomen no liver no spleen no tenderness Extremities right knee incision is well opposed to completely without any worrisome feature Neurologic; trying to keep the right knee in full extension appears there is some weakness of the right hip proximally we'll need to follow this. It is difficult to assess right leg strength in the postoperative state  Impression/plan #1 status post right knee replacement for severe osteoarthritis. She appears to be very stable #2 atrial fibrillation on diltiazem and protonic so this also appears to be stable at #3 history of polio at age 66 I wonder about the right leg weakness  The patient is a good candidate for active rehabilitation. I suspect her stay here will be short

## 2013-09-30 ENCOUNTER — Non-Acute Institutional Stay (SKILLED_NURSING_FACILITY): Payer: Medicare PPO | Admitting: Internal Medicine

## 2013-09-30 DIAGNOSIS — L03115 Cellulitis of right lower limb: Secondary | ICD-10-CM

## 2013-09-30 DIAGNOSIS — I4891 Unspecified atrial fibrillation: Secondary | ICD-10-CM

## 2013-09-30 DIAGNOSIS — L03119 Cellulitis of unspecified part of limb: Secondary | ICD-10-CM

## 2013-09-30 DIAGNOSIS — E876 Hypokalemia: Secondary | ICD-10-CM

## 2013-09-30 DIAGNOSIS — Z96659 Presence of unspecified artificial knee joint: Secondary | ICD-10-CM

## 2013-09-30 DIAGNOSIS — L02419 Cutaneous abscess of limb, unspecified: Secondary | ICD-10-CM

## 2013-09-30 NOTE — Progress Notes (Signed)
Patient ID: [name] [name], female   DOB: February 06, 1941, 73 y.o.   MRN: [id number]   This is an acute visit.  Level of care skilled.  Facility [facility].  Chief complaint-acute visit secondary to erythema right knee incision site.  History of present illness.  Patient is a pleasant 73 year old female who is here for rehabilitation after undergoing a right knee replacement secondary to end stage osteoarthritis-she tolerated the procedure well.  Apparently at today she's developed some erythema around the surgical site and nursing staff has asked me to look at it.  Currently she is afebrile although she says at times she does run a low-grade temperature.  She does not complaining of any fever or chills actually she is slated to see her surgeon tomorrow.  Family medical social history has been reviewed per admission note on 09/24/2013.  Medications have been reviewed per MAR.  Review of systems.  In general denies any fever or chills.  Respiratory no complaints of shortness of breath or cough.  Cardiac no chest pain.  GU does not complain of dysuria.  Muscle skeletal-has noted some erythema around her right knee she does not have a whole lot of pain does have discomfort  Sometimes.  Physical exam.  Temperature is 98.1 pulse 63 respirations 20 blood pressure 122/68.  In general this is a pleasant elderly female in no distress lying comfortably in bed.  Her skin is warm and dry I do note lateral to the right knee surgical site there is an appears some developing erythema approximately 2-3 cm along the surgical site at times there is minimal extending erythema on the medial aspect.  The area is somewhat warm to touch I do not see any drainage or bleeding around the staple area there is postop edema  Heart is regular irregular rate and rhythm without murmur gallop or rub.  Chest is clear to auscultation no labored breathing.  Extremities-does not really have significant pedal edema  bilaterally pedal pulses are intact bilaterally she does have some edema around her knee area this appears to be fairly typical postop presentation  Labs.  09/21/2013.  WBC 8.2 hemoglobin 10.4 platelets 188.  Sodium 135 potassium 4.6 BUN 9 creatinine 0.58.  Assessment and plan.  #1-right knee surgical site erythema-patient remains quite concerned about this-Will start doxycycline empirically 100 mg twice a day for 7 days-will have surgical followup tomorrow which is reassuring.  The area has been marked  So can monitor for any progression of the erythema--also will check a CBC to make sure her hemoglobin is stable as well as white count--also will add probiotic twice a day for 7 days  .  #2 atrial fibrillation this appears to be rate controlled she is on diltiazem---- as well as Pradaxa for anticoagulation  #3-history of hypokalemia she is on potassium will recheck basic metabolic panel   [id number]

## 2013-10-10 ENCOUNTER — Non-Acute Institutional Stay (SKILLED_NURSING_FACILITY): Payer: Medicare PPO | Admitting: Internal Medicine

## 2013-10-10 DIAGNOSIS — IMO0002 Reserved for concepts with insufficient information to code with codable children: Secondary | ICD-10-CM

## 2013-10-10 DIAGNOSIS — M1710 Unilateral primary osteoarthritis, unspecified knee: Secondary | ICD-10-CM

## 2013-10-10 DIAGNOSIS — I10 Essential (primary) hypertension: Secondary | ICD-10-CM

## 2013-10-10 DIAGNOSIS — E876 Hypokalemia: Secondary | ICD-10-CM

## 2013-10-10 DIAGNOSIS — Z96659 Presence of unspecified artificial knee joint: Secondary | ICD-10-CM

## 2013-10-10 DIAGNOSIS — I4891 Unspecified atrial fibrillation: Secondary | ICD-10-CM

## 2013-10-10 DIAGNOSIS — M1711 Unilateral primary osteoarthritis, right knee: Secondary | ICD-10-CM

## 2013-10-10 NOTE — Progress Notes (Signed)
Patient ID: [name] [name], female   DOB: 01-16-1941, 73 y.o.   MRN: [id number]      Facility; [facility] Chief complaint discharge note   History; this is a 73 year old woman who was admitted electively for severe osteoarthritis of the right knee. She underwent a right total knee replacement. She tolerated the surgery well. She has said she had polio when she was 14 involving her right side. She has some weakness of the right leg to when she is fatigued causing her to drag her right leg but that she does not use an ambulatory assist device and other than osteoarthritis has been otherwise functional  She had made good progress with therapy and will be discharged home.  Her stay here has been relatively uncomplicated she did have some postop anemia she was started on iron but could not tolerate this -- nonetheless her hemoglobin appears to be rising  She also has some history of hypokalemia here this is been relatively mild potassium last week was 3.2 increasedt her potassium to 20 mEq twice a day-appears her potassium hasonly risen to 3.3 as of today  A nursing did assess whether she was getting 20 meq twice a day and does not appear this was the case somewhat unclear exactly what dose she has been getting although appears to be less than 20 meq  Twice a day -I suspect we will continue current  dose with expedient followup by primary care provider early next week--and patient is aware of the importance of getting this updated lab in an expedient manner and assures [place] that will be done.  Also some initial concern about possibly a small infection at her surgical site this was treated with antibiotic and appears not to be an issue anymore in fact she just recently saw her surgeon and got a good report.  Vital signs continued to be stable she has no acute complaints today other than wanting to get home    Past Medical History   Diagnosis  Date   .  Hyperlipidemia         was on Pravastatin but joint  pain;has been off for 2-81months ;takes CoQ10   .  Allergic rhinitis         takes Benadryl at bedtime   .  Cancer of skin of leg     .  History of poliomyelitis         Polio  age 89- no significant neuromuscular deficit   .  Collagenous colitis         takes Budesonide daily as needed    .  Potassium deficiency         takes KDUR daily   .  Arthritis     .  GERD (gastroesophageal reflux disease)         takes Omeprazole daily   .  Atrial fibrillation  06-10-2011       takes Diltiazem and Pradaxa daily   .  Hypertension         takes Bisoprolol and Chlorthalidone daily   .  Cough         states every day of her life and every chest xray is always clear   .  History of bronchitis         last time about 34yrs ago   .  Pneumonia         as a child   .  Joint pain     .  Diarrhea  immodium daily   .  History of colon polyps     .  Anemia         hx of         Past Surgical History   Procedure  Laterality  Date   .  Breast biopsy    1947 & 10/07       Benign   .  Tonsillectomy           as a child   .  Skin cancer excision           removal of skin cancer on left leg   .  Endovenous ablation saphenous vein w/ laser    03-03-2011 left greater saphenous vein    .  Endovenous ablation saphenous vein w/ laser    01-06-2011  right greater saphenous vein   .  Appendectomy           at age 40   .  Colonoscopy       .  Epidural infection           x 2    .  Cataracts removed       .  Total knee arthroplasty  Right  09/18/2013       Procedure: TOTAL KNEE ARTHROPLASTY;  Surgeon: Garald Balding, MD;  Location: [place];  Service: Orthopedics;  Laterality: Right;         Current Outpatient Prescriptions on File Prior to Visit   Medication  Sig  Dispense  Refill   .  bisacodyl (DULCOLAX) 10 MG suppository  Place 1 suppository (10 mg total) rectally daily as needed for moderate constipation.   12 suppository   0   .  bisoprolol (ZEBETA) 5 MG tablet  Take 2.5 mg by mouth  daily.         .  budesonide (ENTOCORT EC) 3 MG 24 hr capsule  Take 6 mg by mouth daily as needed (for colitis flare up).          .  chlorthalidone (HYGROTON) 25 MG tablet  Take 1 tablet (25 mg total) by mouth daily.   30 tablet   6   .  Coenzyme Q10 (CO Q 10) 100 MG CAPS  Take 100 mg by mouth daily.         .  dabigatran (PRADAXA) 150 MG CAPS capsule  Take 1 capsule (150 mg total) by mouth every 12 (twelve) hours.   60 capsule   6   .  diltiazem (CARDIZEM CD) 180 MG 24 hr capsule  Take 1 capsule (180 mg total) by mouth daily.   30 capsule   11   .  diphenhydrAMINE (BENADRYL) 50 MG capsule  Take 50 mg by mouth at bedtime.         .  methocarbamol (ROBAXIN) 500 MG tablet  Take 1 tablet (500 mg total) by mouth every 8 (eight) hours as needed for muscle spasms.   30 tablet   0   .  Multiple Vitamin (MULTIVITAMIN WITH MINERALS) TABS tablet  Take 1 tablet by mouth every other day.         Marland Kitchen  omeprazole (PRILOSEC) 20 MG capsule  Take 1 capsule (20 mg total) by mouth daily. Please Order In Box of #42.   42 capsule   2   .  oxyCODONE (OXY IR/ROXICODONE) 5 MG immediate release tablet  Take 1-2 tablets (5-10 mg total) by mouth every 4 (four) hours as needed for  moderate pain, severe pain or breakthrough pain.   40 tablet   0   .  potassium chloride (K-DUR) 10 MEQ tablet  Take 1 tablet (20 mEq total) by mouth 2 (two) times daily.          No current facility-administered medications on file prior to visit.      Social History: Patient  lives in [place] with her husband. Independent with ADLs and IADLs. No ambulatory assist devices. On pradaxa chronically for afib  reports that she has quit smoking. Her smoking use included Cigarettes. She smoked 0.00 packs per day for 18 years. She has never used smokeless tobacco. She reports that she drinks alcohol. She reports that she does not use illicit drugs.   family history includes Alcohol abuse in her father; Colon cancer in her maternal grandmother; Heart  disease in her father and paternal grandfather; Hypertension in her father and sister; Other in her mother and sister; Peripheral vascular disease in her mother; Stomach cancer in her maternal grandfather; Stroke in her father.   Review of systems Gen. no complaints of fever chills Skin-does not comparing of any itching or rashes surgical site appears to be well healed Respiratory does not complain of exertional shortness of breath-has a chronic cough this has been stable Cardiac no exertional chest pain or palpitations GI has collagenous colitis causing episodic diarrhea--not complaining of abdominal pain nausea or vomiting Musculoskeletal; episodic right leg weakness when she is tired might suggest post polio--joint pain appears to be controlled on oxycodone.  Neurologic no complaints of headache or dizziness.   Physical examination Temperature 97.6 pulse of 80 respirations 20 blood pressure 116/78 Gen. patient looks very well no distress Skin is warm and dry surgical site appears to be well healed with expected crusting no increased erythema or drainage or warmth Respiratory clear entry bilaterally no labored breathing   Cardiac heart sounds are either a regular other frequent PACs  no murmur  Or significant lower extremity edema Abdomen soft nontender with positive bowel sounds Extremities right knee incision is well opposed to completely without any worrisome feature--again appears to be healing unremarkably Neurologic; Do not see any gross focal deficits she does have some history of right leg weakness at times she is ambulatory Psych she is alert and oriented x3 pleasant and appropriate  Labs.  2- 25th 2015.  WBC 10.4 hemoglobin 12.0 platelets 450.  Sodium 140 potassium 3.3 BUN 17 creatinine 0.81.  Magnesium-1.6   Impression/plan #1 status post right knee replacement for severe osteoarthritis. She appears to be very stable done well with therapy be going home I suspect  she will do well #2 atrial fibrillation on diltiazem and Prdaxas appears to be rate controlled stable #3 hypokalemia-apparently the somewhat chronic again there's been some confusion over the amount of potassium she has been getting-nonetheless it's just a bit low we'll continue the prescribed dose of 20 mEq twice a day and have this rechecked early next week this will need followup by primary care provider and this was discussed with patient who is aware  #4-hypertension appears to be stable on current medication including diltiazem and chlorthalidone and Zebeta   5-anemia-suspect this was postop hemoglobin is trending up--she did not tolerate iron well.   [id number] note greater than 30 minutes spent preparing this discharge summary                          Not recorded  Level of Service      PR INITIAL NURSING FACILITY CARE/DAY 35 MINUTES [99305]              All Flowsheet Templates (all recorded)      Nursing Home Patient Info Flowsheet                  All Charges for This Encounter      Code Description Service Date Service Provider Modifiers Qty      [place] 35 MINUTES 09/24/2013 Ricard Dillon, MD   1               Other Encounter Related Information      Allergies & Medications             Problem List             History             Patient-Entered Questionnaires       AVS Reports      No AVS Snapshots are available for this encounter.           Smoking Cessation Audit Trail        Ready to Quit Counseling Given User Date and Time      Office Visit - 12/21/2010 Not Answered Yes Jiles Garter, [facility] 12/21/2010 11:04 AM             Diabetic Foot Exam      No data filed           Diabetic Foot Form - Detailed      No data filed           Diabetic Foot Exam - Simple      No data filed

## 2013-10-11 ENCOUNTER — Encounter: Payer: Self-pay | Admitting: Internal Medicine

## 2013-10-15 ENCOUNTER — Telehealth: Payer: Self-pay | Admitting: Family Medicine

## 2013-10-15 DIAGNOSIS — Z0000 Encounter for general adult medical examination without abnormal findings: Secondary | ICD-10-CM

## 2013-10-15 NOTE — Telephone Encounter (Signed)
Requesting lab order for potassium, request it be done at [facility] in [place] fax [phone]. Also is requesting to speak to nurse.

## 2013-10-15 NOTE — Telephone Encounter (Signed)
Please advise lab order.

## 2013-10-15 NOTE — Telephone Encounter (Signed)
She can have a renal panel run at the [facility] of her choice, but also should come in at some point for follow up it looks like she was just discharged from a nursing home after a surgery.

## 2013-10-16 NOTE — Telephone Encounter (Signed)
FYI: On the physician change  Lab order placed and faxed

## 2013-10-16 NOTE — Telephone Encounter (Signed)
So when is her appt with Dr Henderson Baltimore? Needs to be soon because of everything she has gone through. If I run labs and there is a problem I may need to see her if she is not seeing Dr Henderson Baltimore soon. As long as she is OK with that I am willing to have you fax over an order to [facility] to her.

## 2013-10-16 NOTE — Telephone Encounter (Signed)
Patient called back regarding this. I informed her of what Dr. Charlett Blake states and she said that she will go this week to [facility] on [address] in [place]. Fax# [phone]. Please send order. Also, patient did say that she is transferring over to [facility], due to location.

## 2013-10-16 NOTE — Telephone Encounter (Signed)
It looks like appt is 11-20-13.

## 2013-10-17 NOTE — Telephone Encounter (Signed)
OK willing to have you send an order for the renal as requested as long as she is willing to take the chance I may need to see her.

## 2013-10-17 NOTE — Telephone Encounter (Signed)
Lab order has already been placed and faxed per previous documentation. Pt voices understanding.

## 2013-10-18 LAB — RENAL FUNCTION PANEL
Albumin: 4 g/dL (ref 3.5–5.2)
BUN: 20 mg/dL (ref 6–23)
CHLORIDE: 101 meq/L (ref 96–112)
CO2: 27 mEq/L (ref 19–32)
Calcium: 9.2 mg/dL (ref 8.4–10.5)
Creat: 0.88 mg/dL (ref 0.50–1.10)
GLUCOSE: 88 mg/dL (ref 70–99)
Phosphorus: 2.6 mg/dL (ref 2.3–4.6)
Potassium: 3.6 mEq/L (ref 3.5–5.3)
Sodium: 137 mEq/L (ref 135–145)

## 2013-10-29 ENCOUNTER — Telehealth: Payer: Self-pay | Admitting: Internal Medicine

## 2013-10-29 ENCOUNTER — Encounter: Payer: Self-pay | Admitting: *Deleted

## 2013-10-29 NOTE — Telephone Encounter (Signed)
Patient with recent knee surgery and multiple rounds of antibiotics 6 weeks ago.  She has also has a history of collagenous colitis.  She will come in and see [name] [name] RNP on Thursday 3/18.  Nevin Bloodgood would you like stool studies prior? You have seen her in january

## 2013-10-30 NOTE — Telephone Encounter (Signed)
[name] [name],  We can wait until she gets here to collect stool studies but thanks for thinking ahead. Nevin Bloodgood

## 2013-11-01 ENCOUNTER — Ambulatory Visit (INDEPENDENT_AMBULATORY_CARE_PROVIDER_SITE_OTHER): Payer: Medicare PPO | Admitting: Nurse Practitioner

## 2013-11-01 ENCOUNTER — Other Ambulatory Visit: Payer: Medicare PPO

## 2013-11-01 ENCOUNTER — Encounter: Payer: Self-pay | Admitting: Nurse Practitioner

## 2013-11-01 VITALS — BP 118/72 | HR 86 | Ht 66.0 in | Wt 154.0 lb

## 2013-11-01 DIAGNOSIS — R197 Diarrhea, unspecified: Secondary | ICD-10-CM

## 2013-11-01 MED ORDER — NYSTATIN 100000 UNIT/ML MT SUSP: 500000.0000 [IU] | Freq: Four times a day (QID) | OROMUCOSAL | Status: DC

## 2013-11-01 NOTE — Patient Instructions (Addendum)
Your physician has requested that you go to the basement for the following lab work before leaving today: C-Diff   We have sent the following medications to your pharmacy for you to pick up at your convenience: Nystatin, swish and swallow four times a day

## 2013-11-01 NOTE — Progress Notes (Signed)
     History of Present Illness:  Patient is a 73 year old female known to Dr. Olevia Perches for a history of recurrent collagenous colitis (1988 and 2001). Her last colonoscopy in 2011 revealed only a hyperplastic polyp. I saw patient in mid-January for evaluation of diarrhea. She had called the office 6 days prior and started on Entocort  We apparently prescribed one TID but patient took it only twice daily and was already improved by the time I saw her in clinic .Patient was scheduled for knee surgery in February and concerned about being on Entocort while trying to heal her knee. We therefore taper the dose and discontinued it. Patient had knee surgery mid February. She developed recurrent diarrhea, started herself on one Entocort daily with improvement in symptoms. Several days later diarrhea recurred. She is here for followup. Stool does not contain any blood. She has no abdominal discomfort. No fevers.   Current Medications, Allergies, Past Medical History, Past Surgical History, Family History and Social History were reviewed in [employer] record.  Physical Exam: General: Pleasant, well developed , white female in no acute distress Head: Normocephalic and atraumatic Eyes:  sclerae anicteric, conjunctiva pink  Ears: Normal auditory acuity Lungs: Clear throughout to auscultation Heart: Regular rate and rhythm Abdomen: Soft, non distended, non-tender. No masses, no hepatomegaly. Normal bowel sounds Musculoskeletal: Symmetrical with no gross deformities  Extremities: No edema  Neurological: Alert oriented x 4, grossly nonfocal Psychological:  Alert and cooperative. Normal mood and affect  Assessment and Recommendations:  73 year old female with history of collagenous colitis. Entocort was discontinued prior to recent knee surgery. Patient did have 2 rounds of antibiotics recently. Will check stool for C. Difficile. Will call patient with results. If negative, will restart  Entocort 9 mg daily with plans to taper to a maintenance dose.

## 2013-11-02 ENCOUNTER — Encounter: Payer: Self-pay | Admitting: Nurse Practitioner

## 2013-11-02 LAB — CLOSTRIDIUM DIFFICILE BY PCR: CDIFFPCR: NOT DETECTED

## 2013-11-02 NOTE — Progress Notes (Signed)
Reviewed and agree.

## 2013-11-05 ENCOUNTER — Encounter: Payer: Self-pay | Admitting: Nurse Practitioner

## 2013-11-08 ENCOUNTER — Telehealth: Payer: Self-pay | Admitting: Family Medicine

## 2013-11-08 MED ORDER — OMEPRAZOLE 20 MG PO CPDR: 20.0000 mg | DELAYED_RELEASE_CAPSULE | Freq: Every day | ORAL | Status: DC

## 2013-11-08 NOTE — Telephone Encounter (Signed)
Requesting refill on omeprazole DR 20mg , 1 tab daily QTY 42

## 2013-11-08 NOTE — Telephone Encounter (Signed)
Omeprazole refilled per protocol. JG//CMA

## 2013-11-19 LAB — HM MAMMOGRAPHY: HM Mammogram: NORMAL

## 2013-11-20 ENCOUNTER — Ambulatory Visit: Payer: Medicare PPO | Admitting: Physician Assistant

## 2013-11-28 ENCOUNTER — Encounter: Payer: Self-pay | Admitting: Internal Medicine

## 2013-11-28 ENCOUNTER — Ambulatory Visit (INDEPENDENT_AMBULATORY_CARE_PROVIDER_SITE_OTHER): Payer: Medicare PPO | Admitting: Internal Medicine

## 2013-11-28 ENCOUNTER — Other Ambulatory Visit (INDEPENDENT_AMBULATORY_CARE_PROVIDER_SITE_OTHER): Payer: Medicare PPO

## 2013-11-28 VITALS — BP 132/84 | HR 88 | Temp 98.1°F | Ht 66.0 in | Wt 155.0 lb

## 2013-11-28 DIAGNOSIS — I4891 Unspecified atrial fibrillation: Secondary | ICD-10-CM

## 2013-11-28 DIAGNOSIS — M359 Systemic involvement of connective tissue, unspecified: Secondary | ICD-10-CM

## 2013-11-28 DIAGNOSIS — I10 Essential (primary) hypertension: Secondary | ICD-10-CM

## 2013-11-28 DIAGNOSIS — E785 Hyperlipidemia, unspecified: Secondary | ICD-10-CM

## 2013-11-28 DIAGNOSIS — E876 Hypokalemia: Secondary | ICD-10-CM

## 2013-11-28 LAB — BASIC METABOLIC PANEL
BUN: 18 mg/dL (ref 6–23)
CHLORIDE: 98 meq/L (ref 96–112)
CO2: 31 mEq/L (ref 19–32)
Calcium: 9.7 mg/dL (ref 8.4–10.5)
Creatinine, Ser: 0.7 mg/dL (ref 0.4–1.2)
GFR: 83.04 mL/min (ref >60.00)
GLUCOSE: 97 mg/dL (ref 70–99)
Potassium: 3.7 mEq/L (ref 3.5–5.1)
Sodium: 138 mEq/L (ref 135–145)

## 2013-11-28 LAB — CBC WITH DIFFERENTIAL/PLATELET
BASOS PCT: 0.5 % (ref 0.0–3.0)
Basophils Absolute: 0.1 10*3/uL (ref 0.0–0.1)
EOS ABS: 0.1 10*3/uL (ref 0.0–0.7)
Eosinophils Relative: 1.4 % (ref 0.0–5.0)
HCT: 37.6 % (ref 36.0–46.0)
HEMOGLOBIN: 12.9 g/dL (ref 12.0–15.0)
Lymphocytes Relative: 19.1 % (ref 12.0–46.0)
Lymphs Abs: 2 10*3/uL (ref 0.7–4.0)
MCHC: 34.5 g/dL (ref 30.0–36.0)
MCV: 94.5 fl (ref 78.0–100.0)
MONO ABS: 0.8 10*3/uL (ref 0.1–1.0)
Monocytes Relative: 8.1 % (ref 3.0–12.0)
NEUTROS ABS: 7.3 10*3/uL (ref 1.4–7.7)
Neutrophils Relative %: 70.9 % (ref 43.0–77.0)
Platelets: 241 10*3/uL (ref 150.0–400.0)
RBC: 3.98 Mil/uL (ref 3.87–5.11)
RDW: 14.8 % — AB (ref 11.5–14.6)
WBC: 10.4 10*3/uL (ref 4.5–10.5)

## 2013-11-28 NOTE — Assessment & Plan Note (Signed)
Recurrent symptoms with bx dx -  Recurrent flare 08/2013 after being off steroids preop for TKR - back on budesonide, but tapering down with good control of symptoms Advised follow up with GI as ongoing, interval hx reviewed

## 2013-11-28 NOTE — Assessment & Plan Note (Signed)
Poor tolerance of statin - prior simva and prava trials reviewed - on none since 05/2013 Pt reluctant to consider resuming same given good ratio

## 2013-11-28 NOTE — Progress Notes (Signed)
Pre visit review using our clinic review tool, if applicable. No additional management support is needed unless otherwise documented below in the visit note. 

## 2013-11-28 NOTE — Assessment & Plan Note (Signed)
BP Readings from Last 3 Encounters:  11/28/13 132/84  11/01/13 118/72  10/11/13 116/78   The current medical regimen is effective;  continue present plan and medications.

## 2013-11-28 NOTE — Assessment & Plan Note (Signed)
asymptomatic - On chronic anticoag with pradaxa The current medical regimen is effective;  continue present plan and medications.

## 2013-11-28 NOTE — Assessment & Plan Note (Signed)
Recurrent issues - exac by chronic diarrhea and diuretic  Recheck now and adjust replacement dose as needed (poor tol size of 28meq pills)

## 2013-11-28 NOTE — Patient Instructions (Signed)
It was good to see you today.  We have reviewed your prior records including labs and tests today  Test(s) ordered today. Your results will be released to MyChart (or called to you) after review, usually within 72hours after test completion. If any changes need to be made, you will be notified at that same time.  Medications reviewed and updated, no changes recommended at this time.  Please schedule followup in 6 months, call sooner if problems.  

## 2013-11-28 NOTE — Progress Notes (Signed)
Subjective:    Patient ID: [name] [name], female    DOB: 1940/12/29, 73 y.o.   MRN: [id number]  HPI  Patient is here for follow up - transfer from [facility] back to [place] office (remote [place]) Reviewed chronic medical issues and interval medical events  Past Medical History  Diagnosis Date  . Hyperlipidemia     was on Pravastatin but joint pain;has been off for 2-53months ;takes CoQ10  . Allergic rhinitis     takes Benadryl at bedtime  . Cancer of skin of leg   . History of poliomyelitis     Polio  age 4- no significant neuromuscular deficit  . Collagenous colitis     recurrent, takes Budesonide daily as needed   . Potassium deficiency     takes KDUR daily  . Arthritis   . GERD (gastroesophageal reflux disease)     takes Omeprazole daily  . Atrial fibrillation 06-10-2011    takes Diltiazem and Pradaxa daily  . Hypertension     takes Bisoprolol and Chlorthalidone daily  . Cough     states every day of her life and every chest xray is always clear    Review of Systems  Constitutional: Negative for fever and fatigue.  Respiratory: Negative for shortness of breath and wheezing.   Cardiovascular: Negative for chest pain, palpitations and leg swelling.       Objective:   Physical Exam  BP 132/84  Pulse 88  Temp(Src) 98.1 F (36.7 C) (Oral)  Ht 5\' 6"  (1.676 m)  Wt 155 lb (70.308 kg)  BMI 25.03 kg/m2  SpO2 94% Wt Readings from Last 3 Encounters:  11/28/13 155 lb (70.308 kg)  11/01/13 154 lb (69.854 kg)  09/18/13 166 lb 10.7 oz (75.6 kg)   Constitutional: She appears well-developed and well-nourished. No distress.  Neck: Normal range of motion. Neck supple. No JVD present. No thyromegaly present.  Cardiovascular: Normal rate, irregular rhythm and normal heart sounds.  No murmur heard. No BLE edema. Pulmonary/Chest: Effort normal and breath sounds normal. No respiratory distress. She has no wheezes.  Psychiatric: She has an expansive but normal mood and affect. Her behavior is  normal. Judgment and thought content normal.   Lab Results  Component Value Date   WBC 8.2 09/21/2013   HGB 10.4* 09/21/2013   HCT 29.9* 09/21/2013   PLT 188 09/21/2013   GLUCOSE 88 10/16/2013   CHOL 177 03/13/2013   TRIG 176* 03/13/2013   HDL 60 03/13/2013   LDLDIRECT 128.6 12/09/2010   LDLCALC 82 03/13/2013   ALT 18 09/05/2013   AST 17 09/05/2013   NA 137 10/16/2013   K 3.6 10/16/2013   CL 101 10/16/2013   CREATININE 0.88 10/16/2013   BUN 20 10/16/2013   CO2 27 10/16/2013   TSH 1.111 03/13/2013   INR 0.97 09/18/2013   HGBA1C 5.5 03/13/2013    No results found.     Assessment & Plan:   Problem List Items Addressed This Visit   COLLAGENOUS COLITIS     Recurrent symptoms with bx dx -  Recurrent flare 08/2013 after being off steroids preop for TKR - back on budesonide, but tapering down with good control of symptoms Advised follow up with GI as ongoing, interval hx reviewed     Relevant Orders      Basic metabolic panel      CBC with Differential   HYPERLIPIDEMIA - Primary     Poor tolerance of statin - prior simva and prava trials  reviewed - on none since 05/2013 Pt reluctant to consider resuming same given good ratio    HYPERTENSION      BP Readings from Last 3 Encounters:  11/28/13 132/84  11/01/13 118/72  10/11/13 116/78   The current medical regimen is effective;  continue present plan and medications.     Relevant Orders      Basic metabolic panel   Hypokalemia     Recurrent issues - exac by chronic diarrhea and diuretic  Recheck now and adjust replacement dose as needed (poor tol size of 55meq pills)    Relevant Orders      Basic metabolic panel   Paroxysmal Atrial Fibrillation     asymptomatic - On chronic anticoag with pradaxa The current medical regimen is effective;  continue present plan and medications.     Relevant Orders      CBC with Differential

## 2013-12-01 ENCOUNTER — Encounter: Payer: Self-pay | Admitting: Internal Medicine

## 2013-12-03 ENCOUNTER — Encounter: Payer: Self-pay | Admitting: Internal Medicine

## 2013-12-18 ENCOUNTER — Encounter: Payer: Self-pay | Admitting: Nurse Practitioner

## 2013-12-20 ENCOUNTER — Encounter: Payer: Self-pay | Admitting: *Deleted

## 2013-12-20 ENCOUNTER — Other Ambulatory Visit: Payer: Self-pay

## 2013-12-20 MED ORDER — POTASSIUM CHLORIDE ER 10 MEQ PO TBCR: 20.0000 meq | EXTENDED_RELEASE_TABLET | Freq: Two times a day (BID) | ORAL | Status: DC

## 2013-12-20 NOTE — Telephone Encounter (Signed)
This encounter was created in error - please disregard.

## 2013-12-20 NOTE — Telephone Encounter (Signed)
Kim, can you verify how patient should be taking her potassium. In looking at her chart, I am not sure what the dose should be. She stated that she is taking 46meq bid, but it looks like it should be 66meq bid. Please advise. She would like it to go to [facility].  Thanks, MI

## 2014-01-14 ENCOUNTER — Other Ambulatory Visit: Payer: Self-pay | Admitting: *Deleted

## 2014-01-14 MED ORDER — POTASSIUM CHLORIDE ER 10 MEQ PO TBCR: 10.0000 meq | EXTENDED_RELEASE_TABLET | Freq: Two times a day (BID) | ORAL | Status: DC

## 2014-01-14 NOTE — Telephone Encounter (Signed)
Patient stated that she takes 96meq of potassium bid.

## 2014-02-22 ENCOUNTER — Telehealth: Payer: Self-pay | Admitting: Cardiology

## 2014-02-22 ENCOUNTER — Other Ambulatory Visit: Payer: Self-pay | Admitting: *Deleted

## 2014-02-22 MED ORDER — BISOPROLOL FUMARATE 5 MG PO TABS: 2.5000 mg | ORAL_TABLET | Freq: Every day | ORAL | Status: DC

## 2014-02-22 MED ORDER — DABIGATRAN ETEXILATE MESYLATE 150 MG PO CAPS: 150.0000 mg | ORAL_CAPSULE | Freq: Two times a day (BID) | ORAL | Status: DC

## 2014-02-22 NOTE — Telephone Encounter (Signed)
Need a refill on her Pradaxa 150 mg #60,Bisoprolol 5 mg # 30. Please call to [facility] [phone].Pt says she does have an appt on 03-29-14.

## 2014-03-29 ENCOUNTER — Ambulatory Visit (INDEPENDENT_AMBULATORY_CARE_PROVIDER_SITE_OTHER): Payer: Medicare PPO | Admitting: Cardiology

## 2014-03-29 ENCOUNTER — Encounter: Payer: Self-pay | Admitting: Cardiology

## 2014-03-29 VITALS — BP 132/82 | HR 66 | Wt 157.7 lb

## 2014-03-29 DIAGNOSIS — I4891 Unspecified atrial fibrillation: Secondary | ICD-10-CM

## 2014-03-29 DIAGNOSIS — I10 Essential (primary) hypertension: Secondary | ICD-10-CM

## 2014-03-29 DIAGNOSIS — E785 Hyperlipidemia, unspecified: Secondary | ICD-10-CM

## 2014-03-29 MED ORDER — DILTIAZEM HCL ER COATED BEADS 180 MG PO CP24: 180.0000 mg | ORAL_CAPSULE | Freq: Every day | ORAL | Status: DC

## 2014-03-29 MED ORDER — BISOPROLOL FUMARATE 5 MG PO TABS: 2.5000 mg | ORAL_TABLET | Freq: Every day | ORAL | Status: DC

## 2014-03-29 MED ORDER — POTASSIUM CHLORIDE ER 10 MEQ PO TBCR: 10.0000 meq | EXTENDED_RELEASE_TABLET | Freq: Two times a day (BID) | ORAL | Status: DC

## 2014-03-29 NOTE — Patient Instructions (Signed)
Your physician wants you to follow-up in: ONE YEAR WITH DR CRENSHAW You will receive a reminder letter in the mail two months in advance. If you don't receive a letter, please call our office to schedule the follow-up appointment.   Your physician recommends that you HAVE LAB WORK TODAY 

## 2014-03-29 NOTE — Assessment & Plan Note (Signed)
Patient remains in sinus rhythm. Continue beta blocker and calcium blocker. Continue pradaxa. Check hemoglobin and renal function.

## 2014-03-29 NOTE — Assessment & Plan Note (Signed)
Blood pressure controlled. Continue present medications. 

## 2014-03-29 NOTE — Progress Notes (Signed)
HPI: FU AFib. She was evaluated in 03/2009 for chest pain. ETT-echo was done and was normal at submaximal exercise. Patient had episode of atrial fib in [place] in Oct 2012; treated with cardizem and converted spontaneously. TSH 0.901. Echo in Oct 2012 showed normal LV function. There was grade 1 diastolic dysfunction. There was mild left atrial enlargement and mild right atrial enlargement. There was mild aortic insufficiency. There was mild tricuspid regurgitation. Had [facility] Nov 2012 that showed EF 69 and normal perfusion. Patient had monitor for recurrent palpitations. This showed sinus with pacs. Since I last saw her, She denies dyspnea, chest pain, palpitations or syncope.   Current Outpatient Prescriptions  Medication Sig Dispense Refill  . bisoprolol (ZEBETA) 5 MG tablet Take 0.5 tablets (2.5 mg total) by mouth daily.  15 tablet  1  . budesonide (ENTOCORT EC) 3 MG 24 hr capsule Take 1 tablet a day      . chlorthalidone (HYGROTON) 25 MG tablet Take 1 tablet (25 mg total) by mouth daily.  30 tablet  6  . dabigatran (PRADAXA) 150 MG CAPS capsule Take 1 capsule (150 mg total) by mouth every 12 (twelve) hours.  60 capsule  1  . diltiazem (CARDIZEM CD) 180 MG 24 hr capsule Take 1 capsule (180 mg total) by mouth daily.  30 capsule  11  . diphenhydrAMINE (BENADRYL) 50 MG capsule Take 50 mg by mouth at bedtime.      . Multiple Vitamin (MULTIVITAMIN WITH MINERALS) TABS tablet Take 1 tablet by mouth every other day.      . mupirocin ointment (BACTROBAN) 2 % Apply 1 application topically as needed.      Marland Kitchen omeprazole (PRILOSEC) 20 MG capsule Take 1 capsule (20 mg total) by mouth daily. Please Order In Box of #42.  42 capsule  5  . potassium chloride (K-DUR) 10 MEQ tablet Take 1 tablet (10 mEq total) by mouth 2 (two) times daily.  60 tablet  2   No current facility-administered medications for this visit.     Past Medical History  Diagnosis Date  . Hyperlipidemia     was on Pravastatin but  joint pain;has been off for 2-44months ;takes CoQ10  . Allergic rhinitis     takes Benadryl at bedtime  . Cancer of skin of leg   . History of poliomyelitis     Polio  age 37- no significant neuromuscular deficit  . Collagenous colitis     recurrent, takes Budesonide daily as needed   . Potassium deficiency     takes KDUR daily  . Arthritis   . GERD (gastroesophageal reflux disease)     takes Omeprazole daily  . Atrial fibrillation 06-10-2011    takes Diltiazem and Pradaxa daily  . Hypertension     takes Bisoprolol and Chlorthalidone daily  . Cough     states every day of her life and every chest xray is always clear    Past Surgical History  Procedure Laterality Date  . Breast biopsy  1947 & 10/07    Benign  . Tonsillectomy      as a child  . Skin cancer excision      removal of skin cancer on left leg  . Endovenous ablation saphenous vein w/ laser  03-03-2011 left greater saphenous vein   . Endovenous ablation saphenous vein w/ laser  01-06-2011  right greater saphenous vein  . Appendectomy      at age 71  . Colonoscopy    .  Epidural infection      x 2   . Cataracts removed    . Total knee arthroplasty Right 09/18/2013    Procedure: TOTAL KNEE ARTHROPLASTY;  Surgeon: Garald Balding, MD;  Location: [facility];  Service: Orthopedics;  Laterality: Right;    History   Social History  . Marital Status: Married    Spouse Name: N/A    Number of Children: N/A  . Years of Education: N/A   Occupational History  . Not on file.   Social History Main Topics  . Smoking status: Former Smoker -- 18 years    Types: Cigarettes  . Smokeless tobacco: Never Used     Comment: quit smoking 60yrs ago  . Alcohol Use: 0.0 oz/week     Comment: glass of wine daily  . Drug Use: No  . Sexual Activity: Yes    Birth Control/ Protection: Surgical   Other Topics Concern  . Not on file   Social History Narrative   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   Married     Former Smoker quit  1989    Alcohol use-yes -  glasses of wine/day      son works for [employer] - she flies to [place] to visit her grandchildren    ROS: no fevers or chills, productive cough, hemoptysis, dysphasia, odynophagia, melena, hematochezia, dysuria, hematuria, rash, seizure activity, orthopnea, PND, pedal edema, claudication. Remaining systems are negative.  Physical Exam: Well-developed well-nourished in no acute distress.  Skin is warm and dry.  HEENT is normal.  Neck is supple.  Chest is clear to auscultation with normal expansion.  Cardiovascular exam is regular rate and rhythm.  Abdominal exam nontender or distended. No masses palpated. Extremities show no edema. neuro grossly intact  ECG Sinus rhythm at a rate of 66. No ST changes.

## 2014-03-29 NOTE — Assessment & Plan Note (Signed)
Patient discontinued her statin because of myalgias/arthralgias. Check lipids. Continue diet.

## 2014-04-09 ENCOUNTER — Other Ambulatory Visit: Payer: Self-pay | Admitting: Cardiology

## 2014-04-09 LAB — CBC
HEMATOCRIT: 36.4 % (ref 36.0–46.0)
HEMOGLOBIN: 12.5 g/dL (ref 12.0–15.0)
MCH: 33.6 pg (ref 26.0–34.0)
MCHC: 34.3 g/dL (ref 30.0–36.0)
MCV: 97.8 fL (ref 78.0–100.0)
Platelets: 227 10*3/uL (ref 150–400)
RBC: 3.72 MIL/uL — ABNORMAL LOW (ref 3.87–5.11)
RDW: 13.8 % (ref 11.5–15.5)
WBC: 7.4 10*3/uL (ref 4.0–10.5)

## 2014-04-09 LAB — LIPID PANEL
Cholesterol: 235 mg/dL — ABNORMAL HIGH (ref 0–200)
HDL: 99 mg/dL (ref >39)
LDL CALC: 125 mg/dL — AB (ref 0–99)
Total CHOL/HDL Ratio: 2.4 Ratio
Triglycerides: 57 mg/dL (ref <150)
VLDL: 11 mg/dL (ref 0–40)

## 2014-04-09 LAB — BASIC METABOLIC PANEL WITH GFR
BUN: 18 mg/dL (ref 6–23)
CHLORIDE: 101 meq/L (ref 96–112)
CO2: 29 mEq/L (ref 19–32)
Calcium: 9.4 mg/dL (ref 8.4–10.5)
Creat: 0.64 mg/dL (ref 0.50–1.10)
GFR, Est African American: >89 mL/min
GFR, Est Non African American: 89 mL/min
Glucose, Bld: 98 mg/dL (ref 70–99)
POTASSIUM: 3.5 meq/L (ref 3.5–5.3)
Sodium: 140 mEq/L (ref 135–145)

## 2014-04-10 ENCOUNTER — Encounter: Payer: Self-pay | Admitting: *Deleted

## 2014-04-24 ENCOUNTER — Telehealth: Payer: Self-pay | Admitting: Cardiology

## 2014-04-24 MED ORDER — DABIGATRAN ETEXILATE MESYLATE 150 MG PO CAPS: 150.0000 mg | ORAL_CAPSULE | Freq: Two times a day (BID) | ORAL | Status: DC

## 2014-04-24 NOTE — Telephone Encounter (Signed)
Rx was sent to pharmacy electronically. 

## 2014-04-24 NOTE — Telephone Encounter (Signed)
Pt need a new prescription for her Pradaxa 150 mg #60.Please call this in today if possible,going out of town. Please call to [place] [facility] [phone]

## 2014-06-01 ENCOUNTER — Other Ambulatory Visit: Payer: Self-pay

## 2014-06-01 DIAGNOSIS — I10 Essential (primary) hypertension: Secondary | ICD-10-CM

## 2014-06-01 DIAGNOSIS — I4891 Unspecified atrial fibrillation: Secondary | ICD-10-CM

## 2014-06-01 MED ORDER — BISOPROLOL FUMARATE 5 MG PO TABS: 2.5000 mg | ORAL_TABLET | Freq: Every day | ORAL | Status: DC

## 2014-06-24 ENCOUNTER — Telehealth: Payer: Self-pay | Admitting: Internal Medicine

## 2014-06-24 NOTE — Telephone Encounter (Signed)
Pt states she believes she is allergic/ andor side effect to antibiotic she took (levofloxacin). Severe joint pain, tendonitis, weakness legs - pharmacist said this could be side effect but she is still having symptoms even though meds were stopped 2 wks ago. Pt went to Urgent Care mid October in [place], [place].  Pt had no symptoms prior to this medication. Pt is seeking advise from Dr Asa Lente.  [phone]

## 2014-06-25 NOTE — Telephone Encounter (Signed)
Tendon problems are uncommon but potential reaction to Levaquin.  I advise OV with Dr Tamala Julian to further eval possibility of any permanent tendon damage if continued problems  thanks

## 2014-07-29 ENCOUNTER — Telehealth: Payer: Self-pay | Admitting: Internal Medicine

## 2014-07-29 MED ORDER — OMEPRAZOLE 20 MG PO CPDR: 20.0000 mg | DELAYED_RELEASE_CAPSULE | Freq: Every day | ORAL | Status: DC

## 2014-07-29 NOTE — Telephone Encounter (Signed)
Pt called in and wanted to see if Dr Asa Lente could refill her   omeprazole (PRILOSEC) 20 MG capsule [[id number]]   She was getting from Dr Charlett Blake but she is seeing Dr Asa Lente now and she has not yet refilled that for her.

## 2014-07-30 ENCOUNTER — Other Ambulatory Visit: Payer: Self-pay | Admitting: *Deleted

## 2014-07-30 MED ORDER — POTASSIUM CHLORIDE ER 10 MEQ PO TBCR: 10.0000 meq | EXTENDED_RELEASE_TABLET | Freq: Two times a day (BID) | ORAL | Status: DC

## 2014-08-01 ENCOUNTER — Other Ambulatory Visit: Payer: Self-pay | Admitting: Cardiology

## 2014-08-14 ENCOUNTER — Telehealth: Payer: Self-pay | Admitting: Cardiology

## 2014-08-14 MED ORDER — CHLORTHALIDONE 25 MG PO TABS: 25.0000 mg | ORAL_TABLET | Freq: Every day | ORAL | Status: DC

## 2014-08-14 NOTE — Telephone Encounter (Signed)
Refill sent, pt informed

## 2014-08-14 NOTE — Telephone Encounter (Signed)
[name] [name] is calling because she is needing a renewal on her Chlorthalidone 25mg  .sent to [facility] . The pharmacy state that they faxed it ove ron 08/13/14. She will out of medication after tomorrow . Please call if you have any questions

## 2014-08-19 ENCOUNTER — Telehealth: Payer: Self-pay | Admitting: Cardiology

## 2014-08-19 DIAGNOSIS — I4891 Unspecified atrial fibrillation: Secondary | ICD-10-CM

## 2014-08-19 DIAGNOSIS — I10 Essential (primary) hypertension: Secondary | ICD-10-CM

## 2014-08-19 DIAGNOSIS — I480 Paroxysmal atrial fibrillation: Secondary | ICD-10-CM

## 2014-08-19 MED ORDER — BISOPROLOL FUMARATE 5 MG PO TABS: 2.5000 mg | ORAL_TABLET | Freq: Every day | ORAL | Status: DC

## 2014-08-19 MED ORDER — CHLORTHALIDONE 25 MG PO TABS: 25.0000 mg | ORAL_TABLET | Freq: Every day | ORAL | Status: DC

## 2014-08-19 MED ORDER — POTASSIUM CHLORIDE ER 10 MEQ PO TBCR: 10.0000 meq | EXTENDED_RELEASE_TABLET | Freq: Two times a day (BID) | ORAL | Status: DC

## 2014-08-19 MED ORDER — DABIGATRAN ETEXILATE MESYLATE 150 MG PO CAPS: 150.0000 mg | ORAL_CAPSULE | Freq: Two times a day (BID) | ORAL | Status: DC

## 2014-08-19 MED ORDER — DILTIAZEM HCL ER COATED BEADS 180 MG PO CP24: 180.0000 mg | ORAL_CAPSULE | Freq: Every day | ORAL | Status: DC

## 2014-08-19 NOTE — Telephone Encounter (Signed)
Pt called in stating that [facility] is now requesting that her prescriptions be switched to 90 day supplies, so those meds are:  Dabigatran Diltiazem  Potassium chloride  Chlorthalidone Bisoprolol  Please call  Thanks

## 2014-08-19 NOTE — Telephone Encounter (Signed)
Rx(s) sent to pharmacy electronically.  

## 2014-08-22 ENCOUNTER — Other Ambulatory Visit: Payer: Self-pay | Admitting: *Deleted

## 2014-08-22 ENCOUNTER — Telehealth: Payer: Self-pay | Admitting: Cardiology

## 2014-08-22 DIAGNOSIS — I10 Essential (primary) hypertension: Secondary | ICD-10-CM

## 2014-08-22 DIAGNOSIS — I480 Paroxysmal atrial fibrillation: Secondary | ICD-10-CM

## 2014-08-22 DIAGNOSIS — I4891 Unspecified atrial fibrillation: Secondary | ICD-10-CM

## 2014-08-22 MED ORDER — BISOPROLOL FUMARATE 5 MG PO TABS: 2.5000 mg | ORAL_TABLET | Freq: Every day | ORAL | Status: DC

## 2014-08-22 MED ORDER — POTASSIUM CHLORIDE ER 10 MEQ PO TBCR: 10.0000 meq | EXTENDED_RELEASE_TABLET | Freq: Two times a day (BID) | ORAL | Status: DC

## 2014-08-22 MED ORDER — DILTIAZEM HCL ER COATED BEADS 180 MG PO CP24: 180.0000 mg | ORAL_CAPSULE | Freq: Every day | ORAL | Status: DC

## 2014-08-22 MED ORDER — DABIGATRAN ETEXILATE MESYLATE 150 MG PO CAPS
150.0000 mg | ORAL_CAPSULE | Freq: Two times a day (BID) | ORAL | Status: DC
Start: 2014-08-22 — End: 2015-02-20

## 2014-08-22 MED ORDER — CHLORTHALIDONE 25 MG PO TABS: 25.0000 mg | ORAL_TABLET | Freq: Every day | ORAL | Status: DC

## 2014-08-22 NOTE — Telephone Encounter (Signed)
Pt called in for her medicine on Monday,it was 5 medicine. They were not called to her correct pharmcy. Please call them to [place] [phone].

## 2014-09-09 ENCOUNTER — Telehealth: Payer: Self-pay | Admitting: Internal Medicine

## 2014-09-09 MED ORDER — OMEPRAZOLE 20 MG PO CPDR: 20.0000 mg | DELAYED_RELEASE_CAPSULE | Freq: Every day | ORAL | Status: DC

## 2014-09-09 NOTE — Telephone Encounter (Signed)
erx done

## 2014-09-09 NOTE — Telephone Encounter (Signed)
Pt called and needs a 90day supply of omeprazole (PRILOSEC) 20 MG capsule [[id number]]   Sent into [facility]

## 2014-10-08 ENCOUNTER — Ambulatory Visit (INDEPENDENT_AMBULATORY_CARE_PROVIDER_SITE_OTHER): Payer: Medicare PPO | Admitting: Family

## 2014-10-08 ENCOUNTER — Encounter: Payer: Self-pay | Admitting: Family

## 2014-10-08 ENCOUNTER — Ambulatory Visit: Payer: Medicare PPO | Admitting: Internal Medicine

## 2014-10-08 VITALS — BP 132/88 | HR 65 | Temp 98.2°F | Resp 18 | Ht 66.0 in | Wt 161.1 lb

## 2014-10-08 DIAGNOSIS — Z0000 Encounter for general adult medical examination without abnormal findings: Secondary | ICD-10-CM | POA: Insufficient documentation

## 2014-10-08 DIAGNOSIS — I10 Essential (primary) hypertension: Secondary | ICD-10-CM

## 2014-10-08 DIAGNOSIS — E785 Hyperlipidemia, unspecified: Secondary | ICD-10-CM

## 2014-10-08 MED ORDER — ZOLPIDEM TARTRATE 5 MG PO TABS: 5.0000 mg | ORAL_TABLET | Freq: Every evening | ORAL | Status: DC | PRN

## 2014-10-08 MED ORDER — LORAZEPAM 1 MG PO TABS: 1.0000 mg | ORAL_TABLET | Freq: Three times a day (TID) | ORAL | Status: DC | PRN

## 2014-10-08 MED ORDER — CIPROFLOXACIN HCL 500 MG PO TABS: 500.0000 mg | ORAL_TABLET | Freq: Two times a day (BID) | ORAL | Status: DC

## 2014-10-08 NOTE — Patient Instructions (Addendum)
Thank you for choosing [facility].  Summary/Instructions:  Health Maintenance Adopting a healthy lifestyle and getting preventive care can go a long way to promote health and wellness. Talk with your health care provider about what schedule of regular examinations is right for you. This is a good chance for you to check in with your provider about disease prevention and staying healthy. In between checkups, there are plenty of things you can do on your own. Experts have done a lot of research about which lifestyle changes and preventive measures are most likely to keep you healthy. Ask your health care provider for more information. WEIGHT AND DIET  Eat a healthy diet  Be sure to include plenty of vegetables, fruits, low-fat dairy products, and lean protein.  Do not eat a lot of foods high in solid fats, added sugars, or salt.  Get regular exercise. This is one of the most important things you can do for your health.  Most adults should exercise for at least 150 minutes each week. The exercise should increase your heart rate and make you sweat (moderate-intensity exercise).  Most adults should also do strengthening exercises at least twice a week. This is in addition to the moderate-intensity exercise.  Maintain a healthy weight  Body mass index (BMI) is a measurement that can be used to identify possible weight problems. It estimates body fat based on height and weight. Your health care provider can help determine your BMI and help you achieve or maintain a healthy weight.  For females 20 years of age and older:   A BMI below 18.5 is considered underweight.  A BMI of 18.5 to 24.9 is normal.  A BMI of 25 to 29.9 is considered overweight.  A BMI of 30 and above is considered obese.  Watch levels of cholesterol and blood lipids  You should start having your blood tested for lipids and cholesterol at 74 years of age, then have this test every 5 years.  You may need to have  your cholesterol levels checked more often if:  Your lipid or cholesterol levels are high.  You are older than 74 years of age.  You are at high risk for heart disease.  CANCER SCREENING   Lung Cancer  Lung cancer screening is recommended for adults 55-80 years old who are at high risk for lung cancer because of a history of smoking.  A yearly low-dose CT scan of the lungs is recommended for people who:  Currently smoke.  Have quit within the past 15 years.  Have at least a 30-pack-year history of smoking. A pack year is smoking an average of one pack of cigarettes a day for 1 year.  Yearly screening should continue until it has been 15 years since you quit.  Yearly screening should stop if you develop a health problem that would prevent you from having lung cancer treatment.  Breast Cancer  Practice breast self-awareness. This means understanding how your breasts normally appear and feel.  It also means doing regular breast self-exams. Let your health care provider know about any changes, no matter how small.  If you are in your 20s or 30s, you should have a clinical breast exam (CBE) by a health care provider every 1-3 years as part of a regular health exam.  If you are 40 or older, have a CBE every year. Also consider having a breast X-ray (mammogram) every year.  If you have a family history of breast cancer, talk to your health   care provider about genetic screening.  If you are at high risk for breast cancer, talk to your health care provider about having an MRI and a mammogram every year.  Breast cancer gene (BRCA) assessment is recommended for women who have family members with BRCA-related cancers. BRCA-related cancers include:  Breast.  Ovarian.  Tubal.  Peritoneal cancers.  Results of the assessment will determine the need for genetic counseling and BRCA1 and BRCA2 testing. Cervical Cancer Routine pelvic examinations to screen for cervical cancer are no  longer recommended for nonpregnant women who are considered low risk for cancer of the pelvic organs (ovaries, uterus, and vagina) and who do not have symptoms. A pelvic examination may be necessary if you have symptoms including those associated with pelvic infections. Ask your health care provider if a screening pelvic exam is right for you.   The Pap test is the screening test for cervical cancer for women who are considered at risk.  If you had a hysterectomy for a problem that was not cancer or a condition that could lead to cancer, then you no longer need Pap tests.  If you are older than 65 years, and you have had normal Pap tests for the past 10 years, you no longer need to have Pap tests.  If you have had past treatment for cervical cancer or a condition that could lead to cancer, you need Pap tests and screening for cancer for at least 20 years after your treatment.  If you no longer get a Pap test, assess your risk factors if they change (such as having a new sexual partner). This can affect whether you should start being screened again.  Some women have medical problems that increase their chance of getting cervical cancer. If this is the case for you, your health care provider may recommend more frequent screening and Pap tests.  The human papillomavirus (HPV) test is another test that may be used for cervical cancer screening. The HPV test looks for the virus that can cause cell changes in the cervix. The cells collected during the Pap test can be tested for HPV.  The HPV test can be used to screen women 30 years of age and older. Getting tested for HPV can extend the interval between normal Pap tests from three to five years.  An HPV test also should be used to screen women of any age who have unclear Pap test results.  After 74 years of age, women should have HPV testing as often as Pap tests.  Colorectal Cancer  This type of cancer can be detected and often  prevented.  Routine colorectal cancer screening usually begins at 74 years of age and continues through 75 years of age.  Your health care provider may recommend screening at an earlier age if you have risk factors for colon cancer.  Your health care provider may also recommend using home test kits to check for hidden blood in the stool.  A small camera at the end of a tube can be used to examine your colon directly (sigmoidoscopy or colonoscopy). This is done to check for the earliest forms of colorectal cancer.  Routine screening usually begins at age 50.  Direct examination of the colon should be repeated every 5-10 years through 75 years of age. However, you may need to be screened more often if early forms of precancerous polyps or small growths are found. Skin Cancer  Check your skin from head to toe regularly.  Tell your health   your health care provider about any new moles or changes in moles, especially if there is a change in a mole's shape or color.  Also tell your health care provider if you have a mole that is larger than the size of a pencil eraser.  Always use sunscreen. Apply sunscreen liberally and repeatedly throughout the day.  Protect yourself by wearing long sleeves, pants, a wide-brimmed hat, and sunglasses whenever you are outside. HEART DISEASE, DIABETES, AND HIGH BLOOD PRESSURE   Have your blood pressure checked at least every 1-2 years. High blood pressure causes heart disease and increases the risk of stroke.  If you are between 1 years and 61 years old, ask your health care provider if you should take aspirin to prevent strokes.  Have regular diabetes screenings. This involves taking a blood sample to check your fasting blood sugar level.  If you are at a normal weight and have a low risk for diabetes, have this test once every three years after 74 years of age.  If you are overweight and have a high risk for diabetes, consider being tested at a younger age or more  often. PREVENTING INFECTION  Hepatitis B  If you have a higher risk for hepatitis B, you should be screened for this virus. You are considered at high risk for hepatitis B if:  You were born in a country where hepatitis B is common. Ask your health care provider which countries are considered high risk.  Your parents were born in a high-risk country, and you have not been immunized against hepatitis B (hepatitis B vaccine).  You have HIV or AIDS.  You use needles to inject street drugs.  You live with someone who has hepatitis B.  You have had sex with someone who has hepatitis B.  You get hemodialysis treatment.  You take certain medicines for conditions, including cancer, organ transplantation, and autoimmune conditions. Hepatitis C  Blood testing is recommended for:  Everyone born from [birth year] through 1965.  Anyone with known risk factors for hepatitis C. Sexually transmitted infections (STIs)  You should be screened for sexually transmitted infections (STIs) including gonorrhea and chlamydia if:  You are sexually active and are younger than 74 years of age.  You are older than 74 years of age and your health care provider tells you that you are at risk for this type of infection.  Your sexual activity has changed since you were last screened and you are at an increased risk for chlamydia or gonorrhea. Ask your health care provider if you are at risk.  If you do not have HIV, but are at risk, it may be recommended that you take a prescription medicine daily to prevent HIV infection. This is called pre-exposure prophylaxis (PrEP). You are considered at risk if:  You are sexually active and do not regularly use condoms or know the HIV status of your partner(s).  You take drugs by injection.  You are sexually active with a partner who has HIV. Talk with your health care provider about whether you are at high risk of being infected with HIV. If you choose to begin PrEP, you  should first be tested for HIV. You should then be tested every 3 months for as long as you are taking PrEP.  PREGNANCY   If you are premenopausal and you may become pregnant, ask your health care provider about preconception counseling.  If you may become pregnant, take 400 to 800 micrograms (mcg) of folic acid every  day.  If you want to prevent pregnancy, talk to your health care provider about birth control (contraception). OSTEOPOROSIS AND MENOPAUSE   Osteoporosis is a disease in which the bones lose minerals and strength with aging. This can result in serious bone fractures. Your risk for osteoporosis can be identified using a bone density scan.  If you are 5 years of age or older, or if you are at risk for osteoporosis and fractures, ask your health care provider if you should be screened.  Ask your health care provider whether you should take a calcium or vitamin D supplement to lower your risk for osteoporosis.  Menopause may have certain physical symptoms and risks.  Hormone replacement therapy may reduce some of these symptoms and risks. Talk to your health care provider about whether hormone replacement therapy is right for you.  HOME CARE INSTRUCTIONS   Schedule regular health, dental, and eye exams.  Stay current with your immunizations.   Do not use any tobacco products including cigarettes, chewing tobacco, or electronic cigarettes.  If you are pregnant, do not drink alcohol.  If you are breastfeeding, limit how much and how often you drink alcohol.  Limit alcohol intake to no more than 1 drink per day for nonpregnant women. One drink equals 12 ounces of beer, 5 ounces of wine, or 1 ounces of hard liquor.  Do not use street drugs.  Do not share needles.  Ask your health care provider for help if you need support or information about quitting drugs.  Tell your health care provider if you often feel depressed.  Tell your health care provider if you have ever  been abused or do not feel safe at home. Document Released: 02/15/2011 Document Revised: 12/17/2013 Document Reviewed: 07/04/2013 [facility] Patient Information 2015 [place], [place]. This information is not intended to replace advice given to you by your health care provider. Make sure you discuss any questions you have with your health care provider.

## 2014-10-08 NOTE — Progress Notes (Signed)
Subjective:    Patient ID: [name] [name], female    DOB: 07-09-1941, 74 y.o.   MRN: [id number]  Chief Complaint  Patient presents with  . CPE    Not fasting     HPI:  [name] is a 74 y.o. female who presents today for an annual wellness visit.   1) Health Maintenance -   Diet - Eats 3 meals per day; this includes fruits, vegetables and protein; 1-2 cups of caffeine per day  Exercise - Is doing leg exercise and increasing her physical activity.  Wt Readings from Last 3 Encounters:  10/08/14 161 lb 1.9 oz (73.084 kg)  03/29/14 157 lb 11.2 oz (71.532 kg)  11/28/13 155 lb (70.308 kg)    2) Preventative Exams / Immunizations:  Dental -- Up to date  Vision -- Up to date   Health Maintenance  Topic Date Due  . DEXA SCAN  10/25/2005  . INFLUENZA VACCINE  03/17/2015  . MAMMOGRAM  11/20/2015  . TETANUS/TDAP  09/20/2019  . COLONOSCOPY  01/15/2020  . PNEUMOCOCCAL POLYSACCHARIDE VACCINE AGE 51 AND OVER  Completed  . ZOSTAVAX  Completed  . PNA vac Low Risk Adult  Completed  Dr. Lynnette Caffey will order bone scan.    Immunization History  Administered Date(s) Administered  . Influenza Split 06/29/2011, 06/09/2012  . Influenza Whole 06/05/2007, 06/04/2008, 04/27/2009  . Influenza-Unspecified 05/16/2013  . Pneumococcal Conjugate-13 07/24/2013  . Pneumococcal Polysaccharide-23 04/23/2008  . Td 10/20/1998, 09/19/2009  . Zoster 01/25/2006    Allergies  Allergen Reactions  . Levaquin [Levofloxacin In D5w]     Severe join pain, muscle pain, and tendonitis  . Sulfonamide Derivatives Hives    Childhood reaction  . Erythromycin Swelling and Rash    Current Outpatient Prescriptions on File Prior to Visit  Medication Sig Dispense Refill  . bisoprolol (ZEBETA) 5 MG tablet Take 0.5 tablets (2.5 mg total) by mouth daily. 45 tablet 1  . budesonide (ENTOCORT EC) 3 MG 24 hr capsule Take 1 tablet a day    . chlorthalidone (HYGROTON) 25 MG tablet Take 1 tablet (25 mg total) by mouth  daily. 90 tablet 1  . dabigatran (PRADAXA) 150 MG CAPS capsule Take 1 capsule (150 mg total) by mouth every 12 (twelve) hours. 180 capsule 1  . diltiazem (CARDIZEM CD) 180 MG 24 hr capsule Take 1 capsule (180 mg total) by mouth daily. 90 capsule 1  . diphenhydrAMINE (BENADRYL) 50 MG capsule Take 50 mg by mouth at bedtime.    . Multiple Vitamin (MULTIVITAMIN WITH MINERALS) TABS tablet Take 1 tablet by mouth every other day.    . mupirocin ointment (BACTROBAN) 2 % Apply 1 application topically as needed.    Marland Kitchen omeprazole (PRILOSEC) 20 MG capsule Take 1 capsule (20 mg total) by mouth daily. 90 capsule 1  . potassium chloride (K-DUR) 10 MEQ tablet Take 1 tablet (10 mEq total) by mouth 2 (two) times daily. 180 tablet 1   No current facility-administered medications on file prior to visit.    Past Medical History  Diagnosis Date  . Hyperlipidemia     was on Pravastatin but joint pain;has been off for 2-39months ;takes CoQ10  . Allergic rhinitis     takes Benadryl at bedtime  . Cancer of skin of leg   . History of poliomyelitis     Polio  age 3- no significant neuromuscular deficit  . Collagenous colitis     recurrent, takes Budesonide daily as needed   .  Potassium deficiency     takes KDUR daily  . Arthritis   . GERD (gastroesophageal reflux disease)     takes Omeprazole daily  . Atrial fibrillation 06-10-2011    takes Diltiazem and Pradaxa daily  . Hypertension     takes Bisoprolol and Chlorthalidone daily  . Cough     states every day of her life and every chest xray is always clear    Past Surgical History  Procedure Laterality Date  . Breast biopsy  1947 & 10/07    Benign  . Tonsillectomy      as a child  . Skin cancer excision      removal of skin cancer on left leg  . Endovenous ablation saphenous vein w/ laser  03-03-2011 left greater saphenous vein   . Endovenous ablation saphenous vein w/ laser  01-06-2011  right greater saphenous vein  . Appendectomy      at age 29    . Colonoscopy    . Epidural infection      x 2   . Cataracts removed    . Total knee arthroplasty Right 09/18/2013    Procedure: TOTAL KNEE ARTHROPLASTY;  Surgeon: Garald Balding, MD;  Location: [place];  Service: Orthopedics;  Laterality: Right;    Family History  Problem Relation Age of Onset  . Alcohol abuse Father   . Heart disease Father   . Stroke Father   . Hypertension Father   . Colon cancer Maternal Grandmother   . Hypertension Sister   . Other Sister     varicose veins  . Stomach cancer Maternal Grandfather   . Heart disease Paternal Grandfather   . Peripheral vascular disease Mother   . Other Mother     varicose veins    History   Social History  . Marital Status: Married    Spouse Name: N/A  . Number of Children: N/A  . Years of Education: N/A   Occupational History  . Not on file.   Social History Main Topics  . Smoking status: Former Smoker -- 18 years    Types: Cigarettes  . Smokeless tobacco: Never Used     Comment: quit smoking 37yrs ago  . Alcohol Use: 0.0 oz/week     Comment: glass of wine daily  . Drug Use: No  . Sexual Activity: Yes    Birth Control/ Protection: Surgical   Other Topics Concern  . Not on file   Social History Narrative   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   Married     Former Smoker quit 1989    Alcohol use-yes -  glasses of wine/day      son works for [employer] - she flies to [place] to visit her grandchildren      RISK FACTORS  Tobacco History  Smoking status  . Former Smoker -- 18 years  . Types: Cigarettes  Smokeless tobacco  . Never Used    Comment: quit smoking 64yrs ago     Cardiac risk factors: advanced age (older than 16 for men, 71 for women) and hypertension.  Depression Screen  Q1: Over the past two weeks, have you felt down, depressed or hopeless? No  Q2: Over the past two weeks, have you felt little interest or pleasure in doing things? No  Have you lost interest or pleasure in  daily life? No  Do you often feel hopeless? No  Do you cry easily over simple problems? No  Activities  of Daily Living In your present state of health, do you have any difficulty performing the following activities?:  Driving? No Managing money?  No Feeding yourself? No Getting from bed to chair? No Climbing a flight of stairs? No Preparing food and eating?: No Bathing or showering? No Getting dressed: No Getting to the toilet? No Using the toilet: No Moving around from place to place: No In the past year have you fallen or had a near fall?:No   Home Safety Has smoke detector and wears seat belts. No firearms. No excess sun exposure. Are there smokers in your home (other than you)?  No Do you feel safe at home?  Yes  Hearing Difficulties: No Do you often ask people to speak up or repeat themselves? No Do you experience ringing or noises in your ears? No  Do you have difficulty understanding soft or whispered voices? No    Cognitive Testing  Alert? Yes   Normal Appearance? Yes  Oriented to person? Yes  Place? Yes   Time? Yes  Recall of three objects?  Yes  Can perform simple calculations? Yes  Displays appropriate judgment? Yes  Can read the correct time from a watch face? Yes  Do you feel that you have a problem with memory? No  Do you often misplace items? No   Advanced Directives have been discussed with the patient? Yes    Current Physicians/Providers and Suppliers 1.  Gwendolyn Grant, MD - PCP 2.  [name], MD - Cardiology 3.  Delfin Edis, MD - Gastroenterology 4.  Dr. [name], MD - Orthopedics 5.  Megan Morris, DO - GYN/OB 6.  Su Monks, MD - Dermatology   Indicate any recent Medical Services you may have received from other than [facility] providers in the past year (date may be approximate).  All answers were reviewed with the patient and necessary referrals were made:  Mauricio Po, [place]   10/08/2014    Review of Systems   Constitutional: Denies fever, chills, fatigue, or significant weight gain/loss. HENT: Head: Denies headache or neck pain Ears: Denies changes in hearing, ringing in ears, earache, drainage Nose: Denies discharge, stuffiness, itching, nosebleed, sinus pain Throat: Denies sore throat, hoarseness, dry mouth, sores, thrush Eyes: Denies loss/changes in vision, pain, redness, blurry/double vision, flashing lights Cardiovascular: Denies chest pain/discomfort, tightness, palpitations, shortness of breath with activity, difficulty lying down, swelling, sudden awakening with shortness of breath Respiratory: Denies shortness of breath, cough, sputum production, wheezing Gastrointestinal: Denies dysphasia, heartburn, change in appetite, nausea, change in bowel habits, rectal bleeding, constipation, diarrhea, yellow skin or eyes Genitourinary: Denies frequency, urgency, burning/pain, blood in urine, incontinence, change in urinary strength. Musculoskeletal: Denies muscle/joint pain, stiffness, back pain, redness or swelling of joints, trauma Skin: Denies rashes, lumps, itching, dryness, color changes, or hair/nail changes Neurological: Denies dizziness, fainting, seizures, weakness, numbness, tingling, tremor Psychiatric - Denies nervousness, stress, depression or memory loss Endocrine: Denies heat or cold intolerance, sweating, frequent urination, excessive thirst, changes in appetite Hematologic: Denies ease of bruising or bleeding    Objective:     BP 132/88 mmHg  Pulse 65  Temp(Src) 98.2 F (36.8 C) (Oral)  Resp 18  Ht 5\' 6"  (1.676 m)  Wt 161 lb 1.9 oz (73.084 kg)  BMI 26.02 kg/m2  SpO2 97% Nursing note and vital signs reviewed.  Physical Exam  Constitutional: She is oriented to person, place, and time. She appears well-developed and well-nourished.  HENT:  Head: Normocephalic.  Right Ear: Hearing, tympanic membrane, external  ear and ear canal normal.  Left Ear: Hearing, tympanic  membrane, external ear and ear canal normal.  Nose: Nose normal.  Mouth/Throat: Uvula is midline, oropharynx is clear and moist and mucous membranes are normal.  Eyes: Conjunctivae and EOM are normal. Pupils are equal, round, and reactive to light.  Neck: Neck supple. No JVD present. No tracheal deviation present. No thyromegaly present.  Cardiovascular: Normal rate, regular rhythm, normal heart sounds and intact distal pulses.   Pulmonary/Chest: Effort normal and breath sounds normal.  Abdominal: Soft. Bowel sounds are normal. She exhibits no distension and no mass. There is no tenderness. There is no rebound and no guarding.  Musculoskeletal: Normal range of motion. She exhibits no edema or tenderness.  Lymphadenopathy:    She has no cervical adenopathy.  Neurological: She is alert and oriented to person, place, and time. She has normal reflexes. No cranial nerve deficit. She exhibits normal muscle tone. Coordination normal.  Skin: Skin is warm and dry.  Psychiatric: She has a normal mood and affect. Her behavior is normal. Judgment and thought content normal.       Assessment & Plan:   During the course of the visit the patient was educated and counseled about appropriate screening and preventive services including:    Pneumococcal vaccine   Influenza vaccine  Td vaccine  Colorectal cancer screening  Nutrition counseling   Diet review for nutrition referral? Yes ____  Not Indicated _X___   Patient Instructions (the written plan) was given to the patient.  Medicare Attestation I have personally reviewed: The patient's medical and social history Their use of alcohol, tobacco or illicit drugs Their current medications and supplements The patient's functional ability including ADLs,fall risks, home safety risks, cognitive, and hearing and visual impairment Diet and physical activities Evidence for depression or mood disorders  The patient's weight, height, BMI,  have been  recorded in the chart.  I have made referrals, counseling, and provided education to the patient based on review of the above and I have provided the patient with a written personalized care plan for preventive services.     Mauricio Po, [place]   10/08/2014

## 2014-10-08 NOTE — Assessment & Plan Note (Signed)
Reviewed and updated patient's medical, surgical, family and social history. Medications and allergies were also reviewed. Basic screenings for depression, activities of daily living, hearing, cognition and safety were performed. Provider list was updated and health plan was provided to the patient.   1) Anticipatory Guidance: Discussed importance of wearing a seatbelt while driving and not texting while driving; changing batteries in smoke detector at least once annually; wearing suntan lotion when outside; eating a balanced and moderate diet; getting physical activity at least 30 minutes per day.  2) Immunizations / Screenings / Labs:  All immunizations are up-to-date per recommendations. All screenings are up-to-date per recommendations. Previous lab work was reviewed and no further intervention is needed at this time.

## 2014-10-08 NOTE — Progress Notes (Signed)
Pre visit review using our clinic review tool, if applicable. No additional management support is needed unless otherwise documented below in the visit note. 

## 2014-12-17 LAB — HM MAMMOGRAPHY

## 2014-12-25 ENCOUNTER — Encounter: Payer: Self-pay | Admitting: Internal Medicine

## 2015-02-20 ENCOUNTER — Other Ambulatory Visit: Payer: Self-pay | Admitting: Internal Medicine

## 2015-02-20 ENCOUNTER — Other Ambulatory Visit: Payer: Self-pay | Admitting: *Deleted

## 2015-02-20 DIAGNOSIS — I10 Essential (primary) hypertension: Secondary | ICD-10-CM

## 2015-02-20 DIAGNOSIS — I480 Paroxysmal atrial fibrillation: Secondary | ICD-10-CM

## 2015-02-20 DIAGNOSIS — I4891 Unspecified atrial fibrillation: Secondary | ICD-10-CM

## 2015-02-20 MED ORDER — DABIGATRAN ETEXILATE MESYLATE 150 MG PO CAPS: 150.0000 mg | ORAL_CAPSULE | Freq: Two times a day (BID) | ORAL | Status: DC

## 2015-02-20 MED ORDER — POTASSIUM CHLORIDE ER 10 MEQ PO TBCR: 10.0000 meq | EXTENDED_RELEASE_TABLET | Freq: Two times a day (BID) | ORAL | Status: DC

## 2015-02-20 MED ORDER — DILTIAZEM HCL ER COATED BEADS 180 MG PO CP24: 180.0000 mg | ORAL_CAPSULE | Freq: Every day | ORAL | Status: DC

## 2015-02-20 MED ORDER — CHLORTHALIDONE 25 MG PO TABS: 25.0000 mg | ORAL_TABLET | Freq: Every day | ORAL | Status: DC

## 2015-02-20 MED ORDER — BISOPROLOL FUMARATE 5 MG PO TABS: 2.5000 mg | ORAL_TABLET | Freq: Every day | ORAL | Status: DC

## 2015-03-05 ENCOUNTER — Other Ambulatory Visit: Payer: Self-pay | Admitting: *Deleted

## 2015-03-06 ENCOUNTER — Other Ambulatory Visit: Payer: Self-pay | Admitting: *Deleted

## 2015-03-06 MED ORDER — CHLORTHALIDONE 25 MG PO TABS: ORAL_TABLET | ORAL | Status: DC

## 2015-04-15 DIAGNOSIS — B373 Candidiasis of vulva and vagina: Secondary | ICD-10-CM | POA: Diagnosis not present

## 2015-04-15 DIAGNOSIS — R300 Dysuria: Secondary | ICD-10-CM | POA: Diagnosis not present

## 2015-04-15 DIAGNOSIS — N390 Urinary tract infection, site not specified: Secondary | ICD-10-CM | POA: Diagnosis not present

## 2015-04-15 DIAGNOSIS — N952 Postmenopausal atrophic vaginitis: Secondary | ICD-10-CM | POA: Diagnosis not present

## 2015-04-29 ENCOUNTER — Other Ambulatory Visit: Payer: Self-pay | Admitting: Obstetrics & Gynecology

## 2015-04-29 DIAGNOSIS — M8588 Other specified disorders of bone density and structure, other site: Secondary | ICD-10-CM | POA: Diagnosis not present

## 2015-04-29 DIAGNOSIS — N958 Other specified menopausal and perimenopausal disorders: Secondary | ICD-10-CM | POA: Diagnosis not present

## 2015-04-29 DIAGNOSIS — Z124 Encounter for screening for malignant neoplasm of cervix: Secondary | ICD-10-CM | POA: Diagnosis not present

## 2015-04-29 DIAGNOSIS — Z6827 Body mass index (BMI) 27.0-27.9, adult: Secondary | ICD-10-CM | POA: Diagnosis not present

## 2015-04-30 ENCOUNTER — Ambulatory Visit: Payer: Medicare PPO | Admitting: Family

## 2015-04-30 LAB — CYTOLOGY - PAP

## 2015-05-23 NOTE — Progress Notes (Signed)
HPI: FU AFib. She was evaluated in 03/2009 for chest pain. ETT-echo was done and was normal at submaximal exercise. Patient had episode of atrial fib in [place] in Oct 2012; treated with cardizem and converted spontaneously. TSH 0.901. Echo in Oct 2012 showed normal LV function. There was grade 1 diastolic dysfunction. There was mild left atrial enlargement and mild right atrial enlargement. There was mild aortic insufficiency. There was mild tricuspid regurgitation. Had [facility] Nov 2012 that showed EF 69 and normal perfusion. Patient had monitor for recurrent palpitations. This showed sinus with pacs. Since I last saw her, She has some fatigue. She has dyspnea with more extreme activities. No orthopnea, PND, pedal edema, chest pain, palpitations or syncope.  Current Outpatient Prescriptions  Medication Sig Dispense Refill  . bisoprolol (ZEBETA) 5 MG tablet Take 0.5 tablets (2.5 mg total) by mouth daily. NEED OV. 45 tablet 0  . budesonide (ENTOCORT EC) 3 MG 24 hr capsule Take 1 tablet a day    . chlorthalidone (HYGROTON) 25 MG tablet Please call for appointment for further refills 90 tablet 0  . dabigatran (PRADAXA) 150 MG CAPS capsule Take 1 capsule (150 mg total) by mouth every 12 (twelve) hours. NEED OV. 180 capsule 0  . diltiazem (CARDIZEM CD) 180 MG 24 hr capsule Take 1 capsule (180 mg total) by mouth daily. NEED OV. 90 capsule 0  . Doxylamine Succinate, Sleep, (UNISOM PO) Take by mouth at bedtime as needed.    Marland Kitchen LORazepam (ATIVAN) 1 MG tablet Take 1 tablet (1 mg total) by mouth every 8 (eight) hours as needed (Prior to air travel). 10 tablet 0  . omeprazole (PRILOSEC) 20 MG capsule Take 1 capsule (20 mg total) by mouth daily. 90 capsule 3  . potassium chloride (K-DUR) 10 MEQ tablet Take 1 tablet (10 mEq total) by mouth 2 (two) times daily. NEED OV. 180 tablet 0  . zolpidem (AMBIEN) 5 MG tablet Take 1 tablet (5 mg total) by mouth at bedtime as needed for sleep (As needed for air travel). 6  tablet 0   No current facility-administered medications for this visit.     Past Medical History  Diagnosis Date  . Hyperlipidemia     was on Pravastatin but joint pain;has been off for 2-10months ;takes CoQ10  . Allergic rhinitis     takes Benadryl at bedtime  . Cancer of skin of leg   . History of poliomyelitis     Polio  age 61- no significant neuromuscular deficit  . Collagenous colitis     recurrent, takes Budesonide daily as needed   . Potassium deficiency     takes KDUR daily  . Arthritis   . GERD (gastroesophageal reflux disease)     takes Omeprazole daily  . Atrial fibrillation ([place]) 06-10-2011    takes Diltiazem and Pradaxa daily  . Hypertension     takes Bisoprolol and Chlorthalidone daily  . Cough     states every day of her life and every chest xray is always clear    Past Surgical History  Procedure Laterality Date  . Breast biopsy  1947 & 10/07    Benign  . Tonsillectomy      as a child  . Skin cancer excision      removal of skin cancer on left leg  . Endovenous ablation saphenous vein w/ laser  03-03-2011 left greater saphenous vein   . Endovenous ablation saphenous vein w/ laser  01-06-2011  right greater saphenous  vein  . Appendectomy      at age 22  . Colonoscopy    . Epidural infection      x 2   . Cataracts removed    . Total knee arthroplasty Right 09/18/2013    Procedure: TOTAL KNEE ARTHROPLASTY;  Surgeon: Garald Balding, MD;  Location: [facility];  Service: Orthopedics;  Laterality: Right;    Social History   Social History  . Marital Status: Married    Spouse Name: N/A  . Number of Children: N/A  . Years of Education: N/A   Occupational History  . Not on file.   Social History Main Topics  . Smoking status: Former Smoker -- 18 years    Types: Cigarettes  . Smokeless tobacco: Never Used     Comment: quit smoking 82yrs ago  . Alcohol Use: 0.0 oz/week     Comment: glass of wine daily  . Drug Use: No  . Sexual Activity: Yes     Birth Control/ Protection: Surgical   Other Topics Concern  . 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 Remaining systems are negative.  Physical Exam: Well-developed well-nourished in no acute distress.  Skin is warm and dry.  HEENT is normal.  Neck is supple.  Chest is clear to auscultation with normal expansion.  Cardiovascular exam is regular rate and rhythm.  Abdominal exam nontender or distended. No masses palpated. Extremities show no edema. neuro grossly intact  ECG Normal sinus rhythm at a rate of 64. No ST changes.

## 2015-05-29 ENCOUNTER — Encounter: Payer: Self-pay | Admitting: Cardiology

## 2015-05-29 ENCOUNTER — Ambulatory Visit (INDEPENDENT_AMBULATORY_CARE_PROVIDER_SITE_OTHER): Payer: Medicare PPO | Admitting: Cardiology

## 2015-05-29 VITALS — BP 110/70 | HR 64 | Ht 66.0 in | Wt 168.8 lb

## 2015-05-29 DIAGNOSIS — R5383 Other fatigue: Secondary | ICD-10-CM | POA: Diagnosis not present

## 2015-05-29 DIAGNOSIS — I4891 Unspecified atrial fibrillation: Secondary | ICD-10-CM

## 2015-05-29 DIAGNOSIS — Z79899 Other long term (current) drug therapy: Secondary | ICD-10-CM

## 2015-05-29 DIAGNOSIS — I10 Essential (primary) hypertension: Secondary | ICD-10-CM

## 2015-05-29 DIAGNOSIS — I480 Paroxysmal atrial fibrillation: Secondary | ICD-10-CM

## 2015-05-29 DIAGNOSIS — E785 Hyperlipidemia, unspecified: Secondary | ICD-10-CM

## 2015-05-29 MED ORDER — CHLORTHALIDONE 25 MG PO TABS: 25.0000 mg | ORAL_TABLET | Freq: Every day | ORAL | Status: DC

## 2015-05-29 MED ORDER — POTASSIUM CHLORIDE ER 10 MEQ PO TBCR: 10.0000 meq | EXTENDED_RELEASE_TABLET | Freq: Two times a day (BID) | ORAL | Status: DC

## 2015-05-29 MED ORDER — BISOPROLOL FUMARATE 5 MG PO TABS: 2.5000 mg | ORAL_TABLET | Freq: Every day | ORAL | Status: DC

## 2015-05-29 MED ORDER — DABIGATRAN ETEXILATE MESYLATE 150 MG PO CAPS: 150.0000 mg | ORAL_CAPSULE | Freq: Two times a day (BID) | ORAL | Status: DC

## 2015-05-29 MED ORDER — DILTIAZEM HCL ER COATED BEADS 180 MG PO CP24: 180.0000 mg | ORAL_CAPSULE | Freq: Every day | ORAL | Status: DC

## 2015-05-29 NOTE — Assessment & Plan Note (Signed)
Check lipids. Statin discontinued previously because of muscle weakness.

## 2015-05-29 NOTE — Assessment & Plan Note (Signed)
Etiology unclear. Check TSH and hemoglobin.

## 2015-05-29 NOTE — Patient Instructions (Addendum)
Your physician recommends that you return for lab work (FASTING) - CBC, BMET, lipid, TSH  Your physician wants you to follow-up in: 1 year with Dr. Stanford Breed. You will receive a reminder letter in the mail two months in advance. If you don't receive a letter, please call our office to schedule the follow-up appointment.

## 2015-05-29 NOTE — Assessment & Plan Note (Signed)
Continue beta blocker, calcium blocker and pradaxa. Patient remains in sinus rhythm. Check hemoglobin and renal function.

## 2015-05-29 NOTE — Assessment & Plan Note (Signed)
Blood pressure controlled. Continue present medications. 

## 2015-06-09 DIAGNOSIS — R5383 Other fatigue: Secondary | ICD-10-CM | POA: Diagnosis not present

## 2015-06-09 DIAGNOSIS — Z79899 Other long term (current) drug therapy: Secondary | ICD-10-CM | POA: Diagnosis not present

## 2015-06-09 DIAGNOSIS — E785 Hyperlipidemia, unspecified: Secondary | ICD-10-CM | POA: Diagnosis not present

## 2015-06-10 LAB — BASIC METABOLIC PANEL
BUN: 20 mg/dL (ref 7–25)
CALCIUM: 9.4 mg/dL (ref 8.6–10.4)
CO2: 32 mmol/L — ABNORMAL HIGH (ref 20–31)
Chloride: 101 mmol/L (ref 98–110)
Creat: 0.61 mg/dL (ref 0.60–0.93)
GLUCOSE: 81 mg/dL (ref 65–99)
Potassium: 3.9 mmol/L (ref 3.5–5.3)
SODIUM: 141 mmol/L (ref 135–146)

## 2015-06-10 LAB — TSH: TSH: 0.582 u[IU]/mL (ref 0.350–4.500)

## 2015-06-10 LAB — CBC
HEMATOCRIT: 37.6 % (ref 36.0–46.0)
Hemoglobin: 12.8 g/dL (ref 12.0–15.0)
MCH: 33.3 pg (ref 26.0–34.0)
MCHC: 34 g/dL (ref 30.0–36.0)
MCV: 97.9 fL (ref 78.0–100.0)
MPV: 11.7 fL (ref 8.6–12.4)
Platelets: 268 10*3/uL (ref 150–400)
RBC: 3.84 MIL/uL — ABNORMAL LOW (ref 3.87–5.11)
RDW: 14 % (ref 11.5–15.5)
WBC: 7 10*3/uL (ref 4.0–10.5)

## 2015-06-10 LAB — LIPID PANEL
CHOLESTEROL: 219 mg/dL — AB (ref 125–200)
HDL: 91 mg/dL (ref >=46)
LDL CALC: 115 mg/dL (ref <130)
TRIGLYCERIDES: 66 mg/dL (ref <150)
Total CHOL/HDL Ratio: 2.4 Ratio (ref <=5.0)
VLDL: 13 mg/dL (ref <30)

## 2015-06-16 ENCOUNTER — Telehealth: Payer: Self-pay | Admitting: Internal Medicine

## 2015-06-16 NOTE — Telephone Encounter (Signed)
Spoke to patient and she states that she is having abd pain and diarrhea. It started about a week ago. Patient scheduled with Amy for evaluation on 06-23-15. Patient aware to go to the ED or urgent care if symptoms get worse.

## 2015-06-23 ENCOUNTER — Ambulatory Visit: Payer: Medicare PPO | Admitting: Physician Assistant

## 2015-07-08 ENCOUNTER — Encounter: Payer: Self-pay | Admitting: *Deleted

## 2015-07-14 ENCOUNTER — Encounter: Payer: Self-pay | Admitting: Physician Assistant

## 2015-07-14 ENCOUNTER — Telehealth: Payer: Self-pay | Admitting: *Deleted

## 2015-07-14 ENCOUNTER — Ambulatory Visit (INDEPENDENT_AMBULATORY_CARE_PROVIDER_SITE_OTHER): Payer: Medicare PPO | Admitting: Physician Assistant

## 2015-07-14 VITALS — BP 160/94 | HR 84 | Ht 64.25 in | Wt 169.1 lb

## 2015-07-14 DIAGNOSIS — R197 Diarrhea, unspecified: Secondary | ICD-10-CM | POA: Diagnosis not present

## 2015-07-14 DIAGNOSIS — K52831 Collagenous colitis: Secondary | ICD-10-CM | POA: Diagnosis not present

## 2015-07-14 DIAGNOSIS — Z800 Family history of malignant neoplasm of digestive organs: Secondary | ICD-10-CM | POA: Diagnosis not present

## 2015-07-14 MED ORDER — BUDESONIDE 3 MG PO CPEP: ORAL_CAPSULE | ORAL | Status: DC

## 2015-07-14 MED ORDER — NA SULFATE-K SULFATE-MG SULF 17.5-3.13-1.6 GM/177ML PO SOLN: 1.0000 | Freq: Once | ORAL | Status: AC

## 2015-07-14 NOTE — Telephone Encounter (Signed)
  07/14/2015   RE: Larsyn Losa Jarriel DOB: 03/02/1941 MRN: [id number]   Dear  Dr. [name],    We have scheduled the above patient for an endoscopic procedure. Our records show that she is on anticoagulation therapy.   Please advise as to how long the patient may come off her therapy of Pradaxa prior to the procedure, which is scheduled for 09-04-2015.  Please fax back/ or route the completed form to [facility] at [phone].   Sincerely,    Amy Esterwood PA-C

## 2015-07-14 NOTE — Progress Notes (Signed)
Patient ID: [name] [name], female   DOB: 06-03-1941, 74 y.o.   MRN: [id number]   Subjective:    Patient ID: [name] [name], female    DOB: 1940-08-27, 74 y.o.   MRN: [id number]  HPI  Cloyce is a pleasant 74 year old female former patient of Dr. Sydell Axon Brodie's who has history of collagenous colitis. She was last seen in March 2015 at which time she had had an exacerbation. She generally responds quickly to Entocort. She says she had a recurrence of diarrhea in September 2016 which started about 2 days after she started taking Augmentin for bronchitis. She says she stopped the medicine immediately but she continued to have diarrhea for about 2 or 3 weeks. She started herself on Entocort 3 mg twice daily and says she did not respond very quickly but eventually the diarrhea or resolved. Over the past month she has not had any problems with diarrhea. At her worst she was going 5-6 times per day, now one to 2 formed bowel movements per day. No melena or hematochezia. She has no complaints of abdominal pain. She is still using Imodium as needed if she knows that she has to go out somewhere. Patient also questions having another colonoscopy. Last colon was done in 2011, she had one hyperplastic polyp at that time. She has strong family history of colon cancer with paternal grandmother diagnosed in her 58s and now has a son who was diagnosed at age 15 with colorectal cancer.  Patient is on Pradaxa for atrial fibrillation, followed by Dr. Stanford Breed. Other medical problems include hypertension and hyperlipidemia.  Review of Systems Pertinent positive and negative review of systems were noted in the above HPI section.  All other review of systems was otherwise negative.  Outpatient Encounter Prescriptions as of 07/14/2015  Medication Sig  . bisoprolol (ZEBETA) 5 MG tablet Take 0.5 tablets (2.5 mg total) by mouth daily.  . chlorthalidone (HYGROTON) 25 MG tablet Take 1 tablet (25 mg total) by mouth daily.  . dabigatran  (PRADAXA) 150 MG CAPS capsule Take 1 capsule (150 mg total) by mouth every 12 (twelve) hours.  Marland Kitchen diltiazem (CARDIZEM CD) 180 MG 24 hr capsule Take 1 capsule (180 mg total) by mouth daily.  . Doxylamine Succinate, Sleep, (UNISOM PO) Take by mouth at bedtime as needed.  Marland Kitchen omeprazole (PRILOSEC) 20 MG capsule Take 1 capsule (20 mg total) by mouth daily.  . potassium chloride (K-DUR) 10 MEQ tablet Take 1 tablet (10 mEq total) by mouth 2 (two) times daily.  . Triprolidine-Pseudoephedrine (ANTIHISTAMINE PO) Take 1 tablet by mouth at bedtime.  . budesonide (ENTOCORT EC) 3 MG 24 hr capsule Take 3 capsules 3 times daily  . Na Sulfate-K Sulfate-Mg Sulf SOLN Take 1 kit by mouth once.  . [DISCONTINUED] budesonide (ENTOCORT EC) 3 MG 24 hr capsule Take 1 tablet a day  . [DISCONTINUED] LORazepam (ATIVAN) 1 MG tablet Take 1 tablet (1 mg total) by mouth every 8 (eight) hours as needed (Prior to air travel).  . [DISCONTINUED] zolpidem (AMBIEN) 5 MG tablet Take 1 tablet (5 mg total) by mouth at bedtime as needed for sleep (As needed for air travel).   No facility-administered encounter medications on file as of 07/14/2015.   Allergies  Allergen Reactions  . Augmentin [Amoxicillin-Pot Clavulanate] Diarrhea  . Levaquin [Levofloxacin In D5w]     Severe join pain, muscle pain, and tendonitis  . Polysporin [Bacitracin-Polymyxin B]     Blisters   . Sulfonamide Derivatives Hives  Childhood reaction  . Erythromycin Swelling and Rash   Patient Active Problem List   Diagnosis Date Noted  . Fatigue 05/29/2015  . Routine general medical examination at a health care facility 10/08/2014  . Medicare annual wellness visit, subsequent 10/08/2014  . Knee joint replacement by other means 09/30/2013  . Osteoarthritis of right knee 09/21/2013  . S/P total knee replacement using cement 09/18/2013  . Anisocoria 07/26/2013  . Knee osteoarthritis 06/09/2012  . Varicose veins of lower extremities with other complications  27/01/2375  . Paroxysmal Atrial Fibrillation 06/29/2011  . Hypokalemia 06/29/2011  . COUGH, CHRONIC 03/09/2010  . HIP PAIN, BILATERAL 07/17/2009  . CHEST PAIN 03/31/2009  . ALLERGIC RHINITIS 04/23/2008  . GERD 11/01/2007  . Essential hypertension 09/20/2007  . SKIN CANCER, LEG 07/03/2007  . HYPERLIPIDEMIA 07/03/2007  . COLLAGENOUS COLITIS 07/03/2007  . POLIOMYELITIS, HX OF 07/03/2007  . ANEMIA, HX OF 07/03/2007   Social History   Social History  . Marital Status: Married    Spouse Name: N/A  . Number of Children: N/A  . Years of Education: N/A   Occupational History  . Not on file.   Social History Main Topics  . Smoking status: Former Smoker -- 18 years    Types: Cigarettes  . Smokeless tobacco: Never Used     Comment: quit smoking 69yr ago  . Alcohol Use: 0.0 oz/week     Comment: glass of wine daily  . Drug Use: No  . Sexual Activity: Yes    Birth Control/ Protection: Surgical   Other Topics Concern  . 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 Crissman's family history includes Alcohol abuse in her father; Cancer in her son; Colon cancer in her maternal grandmother; Heart disease in her father and paternal grandfather; Hypertension in her father and sister; Other in her mother and sister; Peripheral vascular disease in her mother; Stomach cancer in her maternal grandfather; Stroke in her father.      Objective:    Filed Vitals:   07/14/15 1437  BP: 160/94  Pulse: 84    Physical Exam  well-developed older white female in no acute distress, pleasant blood pressure 160/94 pulse 84 height 5 foot 4 weight 169. HEENT ;nontraumatic normocephalic EOMI PERRLA , sclera anicteric, Supple ;no JVD, Cardiovascular; regular rate and rhythm with S1-S2, Pulmonary; clear  bilaterally, Abdomen; soft nontender nondistended bowel sounds are active there is no palpable mass or hepatosplenomegaly, Rectal ;exam not done, Ext; no clubbing cyanosis or edema skin warm and dry, Neuropsych; mood and affect appropriate       Assessment & Plan:    #1 74yo female with hx of collagenous colitis -periodic exacerbations- episode of diarrhea in September aggravated by Augmentin - resolved #2 Strong family hx of colon cancer in son  Age 74 and PGM age 74#3 hx colon polyps #4 chronic anticoagulation - Pradaxa #5 hx atrial fib  Plan; Refill Entocort 937mdaily  To use for flares - she is asked to call when she has a flare so we can follow, and reviewed dosing etc  Schedule for Colonoscopy with Dr. NaSilverio Decamp Procedure discussed in detail with pt and she is  agreeable to proceed. Pt will need to hold Pradaxa  3-5 days  Prior to procedure . We will communicate with her Cardiologist Dr  Stanford Breed  to assure this is reasonable for this pt.  Amy Genia Harold PA-C 07/14/2015   Cc: Rowe Clack, MD

## 2015-07-14 NOTE — Telephone Encounter (Signed)
Hold pradaxa 2 days prior to procedure and resume day after if no intervention [name]

## 2015-07-14 NOTE — Patient Instructions (Signed)
We sent a prescription to [facility], [address]. Entocort 3 mg.Take 3 tab daily.  We also sent a prescription for Suprep for the colonoscopy. We will call you when we hear from Dr. Stanford Breed with the Pradaxa instructions.  You have been scheduled for a colonoscopy. Please follow written instructions given to you at your visit today.  Please pick up your prep supplies at the pharmacy within the next 1-3 days. If you use inhalers (even only as needed), please bring them with you on the day of your procedure. Your physician has requested that you go to www.startemmi.com and enter the access code given to you at your visit today. This web site gives a general overview about your procedure. However, you should still follow specific instructions given to you by our office regarding your preparation for the procedure.

## 2015-07-15 NOTE — Progress Notes (Signed)
Reviewed and agree with documentation and assessment and plan. K. Veena Amberlyn Martinezgarcia , MD   

## 2015-07-16 NOTE — Telephone Encounter (Signed)
LM for the patient on her home number, advising we hear from Dr. Stanford Breed regarding the Pradaxa.  Lm that she can hold the Pradaxa 2 days prior to the procedure date. She can resume it on 09-04-2014.  ( Off a total of 3 days). LM for her to call me if she has any qquestions.

## 2015-08-26 ENCOUNTER — Telehealth: Payer: Self-pay | Admitting: Gastroenterology

## 2015-08-26 NOTE — Telephone Encounter (Signed)
She had a flare and had to start the budesonide. She is feeling much better. Her colonoscopy is 09/04/15. Is this okay?

## 2015-08-27 NOTE — Telephone Encounter (Signed)
Yes , that is fine- would ask her to take 9 mg per day x 4 -6 weeks

## 2015-09-04 ENCOUNTER — Ambulatory Visit (AMBULATORY_SURGERY_CENTER): Payer: Medicare Other | Admitting: Gastroenterology

## 2015-09-04 ENCOUNTER — Encounter: Payer: Self-pay | Admitting: Gastroenterology

## 2015-09-04 VITALS — BP 180/78 | HR 52 | Temp 96.7°F | Resp 52

## 2015-09-04 DIAGNOSIS — Z1211 Encounter for screening for malignant neoplasm of colon: Secondary | ICD-10-CM

## 2015-09-04 DIAGNOSIS — D120 Benign neoplasm of cecum: Secondary | ICD-10-CM

## 2015-09-04 DIAGNOSIS — K52831 Collagenous colitis: Secondary | ICD-10-CM

## 2015-09-04 DIAGNOSIS — Z800 Family history of malignant neoplasm of digestive organs: Secondary | ICD-10-CM

## 2015-09-04 MED ORDER — SODIUM CHLORIDE 0.9 % IV SOLN: 500.0000 mL | INTRAVENOUS | Status: DC

## 2015-09-04 NOTE — Op Note (Signed)
[facility]  [address]. [place], [id number]   COLONOSCOPY PROCEDURE REPORT  PATIENT: [name] [name], [name] [name]  MR#: [id number] BIRTHDATE: Feb 01, 1941 , 3  yrs. old GENDER: female ENDOSCOPIST: Harl Bowie, MD REFERRED [id number] Asa Lente, M.D. PROCEDURE DATE:  09/04/2015 PROCEDURE:   Colonoscopy, screening, Colonoscopy with biopsy, and Colonoscopy with snare polypectomy First Screening Colonoscopy - Avg.  risk and is 50 yrs.  old or older - No.  Prior Negative Screening - Now for repeat screening. N/A  History of Adenoma - Now for follow-up colonoscopy & has been > or = to 3 yrs.  N/A  Polyps removed today? Yes ASA CLASS:   Class II INDICATIONS:Screening for colonic neoplasia and FH Colon or Rectal Adenocarcinoma. MEDICATIONS: Propofol 300 mg IV  DESCRIPTION OF PROCEDURE:   After the risks benefits and alternatives of the procedure were thoroughly explained, informed consent was obtained.  The digital rectal exam revealed no abnormalities of the rectum.   The LB [id number]  endoscope was introduced through the anus and advanced to the terminal ileum which was intubated for a short distance. No adverse events experienced.   The quality of the prep was good.  The instrument was then slowly withdrawn as the colon was fully examined. Estimated blood loss is zero unless otherwise noted in this procedure report.   COLON FINDINGS: Two flat polyps measuring 10-15 mm in size were found at the cecum.  A polypectomy was performed using snare cautery.  The resection was complete, the polyp tissue was completely retrieved and sent to histology.   The examination was otherwise normal and random biopsies were obtained throughout the colon.   Small internal hemorrhoids were found.  Retroflexed views revealed internal hemorrhoids. The time to cecum = 6.0 Withdrawal time = 12.4   The scope was withdrawn and the procedure completed. COMPLICATIONS: There were no immediate  complications.  ENDOSCOPIC IMPRESSION: 1.   Two flat polyps were found at the cecum; polypectomy was performed using snare cautery 2.   The examination was otherwise normal 3.   Small internal hemorrhoids  RECOMMENDATIONS: If the polyp(s) removed today are proven to be adenomatous (pre-cancerous) polyps, you will need a colonoscopy in 3 years. You will receive a letter within 1-2 weeks with the results of your biopsy as well as final recommendations.  Please call my office if you have not received a letter after 3 weeks. Restart Pradaxa in 2 days  eSigned:  Harl Bowie, MD 09/04/2015 2:17 PM

## 2015-09-04 NOTE — Patient Instructions (Signed)
YOU HAD AN ENDOSCOPIC PROCEDURE TODAY AT [facility]:   Refer to the procedure report that was given to you for any specific questions about what was found during the examination.  If the procedure report does not answer your questions, please call your gastroenterologist to clarify.  If you requested that your care partner not be given the details of your procedure findings, then the procedure report has been included in a sealed envelope for you to review at your convenience later.  YOU SHOULD EXPECT: Some feelings of bloating in the abdomen. Passage of more gas than usual.  Walking can help get rid of the air that was put into your GI tract during the procedure and reduce the bloating. If you had a lower endoscopy (such as a colonoscopy or flexible sigmoidoscopy) you may notice spotting of blood in your stool or on the toilet paper. If you underwent a bowel prep for your procedure, you may not have a normal bowel movement for a few days.  Please Note:  You might notice some irritation and congestion in your nose or some drainage.  This is from the oxygen used during your procedure.  There is no need for concern and it should clear up in a day or so.  SYMPTOMS TO REPORT IMMEDIATELY:   Following lower endoscopy (colonoscopy or flexible sigmoidoscopy):  Excessive amounts of blood in the stool  Significant tenderness or worsening of abdominal pains  Swelling of the abdomen that is new, acute  Fever of 100F or higher   For urgent or emergent issues, a gastroenterologist can be reached at any hour by calling [phone].   DIET: Your first meal following the procedure should be a small meal and then it is ok to progress to your normal diet. Heavy or fried foods are harder to digest and may make you feel nauseous or bloated.  Likewise, meals heavy in dairy and vegetables can increase bloating.  Drink plenty of fluids but you should avoid alcoholic beverages for 24  hours.  ACTIVITY:  You should plan to take it easy for the rest of today and you should NOT DRIVE or use heavy machinery until tomorrow (because of the sedation medicines used during the test).    FOLLOW UP: Our staff will call the number listed on your records the next business day following your procedure to check on you and address any questions or concerns that you may have regarding the information given to you following your procedure. If we do not reach you, we will leave a message.  However, if you are feeling well and you are not experiencing any problems, there is no need to return our call.  We will assume that you have returned to your regular daily activities without incident.  If any biopsies were taken you will be contacted by phone or by letter within the next 1-3 weeks.  Please call us at [phone] if you have not heard about the biopsies in 3 weeks.    SIGNATURES/CONFIDENTIALITY: You and/or your care partner have signed paperwork which will be entered into your electronic medical record.  These signatures attest to the fact that that the information above on your After Visit Summary has been reviewed and is understood.  Full responsibility of the confidentiality of this discharge information lies with you and/or your care-partner.  Polyp/hemorrhoid handout given Await pathology results

## 2015-09-04 NOTE — Progress Notes (Signed)
Patient's hr irregular upon adm to unit. She has a history of A-fib. Upon monitor evaluation, the heart rate was regular.  See strip in chart. Patient's BP is elevated, and both Dr. Silverio Decamp and the CRNA were notified about that and the heart rate.

## 2015-09-04 NOTE — Progress Notes (Signed)
Called to room to assist during endoscopic procedure.  Patient ID and intended procedure confirmed with present staff. Received instructions for my participation in the procedure from the performing physician.  

## 2015-09-04 NOTE — Progress Notes (Signed)
Report to PACU, RN, vss, BBS= Clear.  

## 2015-09-05 ENCOUNTER — Telehealth: Payer: Self-pay

## 2015-09-05 NOTE — Telephone Encounter (Signed)
  Follow up Call-  Call back number 09/04/2015  Post procedure Call Back phone  # [phone]  Permission to leave phone message Yes     Patient questions:  Do you have a fever, pain , or abdominal swelling? No. Pain Score  0 *  Have you tolerated food without any problems? Yes.    Have you been able to return to your normal activities? Yes.    Do you have any questions about your discharge instructions: Diet   No. Medications  No. Follow up visit  No.  Do you have questions or concerns about your Care? No.  Actions: * If pain score is 4 or above: No action needed, pain <4.

## 2015-09-17 ENCOUNTER — Encounter: Payer: Self-pay | Admitting: Gastroenterology

## 2015-09-29 ENCOUNTER — Telehealth: Payer: Self-pay | Admitting: Cardiology

## 2015-09-29 NOTE — Telephone Encounter (Signed)
Pt says she needs to talk to you about getting a prior authorization for her Pradaxa.

## 2015-09-29 NOTE — Telephone Encounter (Signed)
Message routed to Dr. Stanford Breed primary nurse, Hilda Blades RN

## 2015-09-30 NOTE — Telephone Encounter (Signed)
Spoke with pt, aware PA for pradaxa has been approved until 08-15-16.

## 2015-11-21 ENCOUNTER — Telehealth: Payer: Self-pay

## 2015-11-21 NOTE — Telephone Encounter (Signed)
Call to [name] [name] and left VM regarding staying a few minutes after her apt with dr. Quay Burow to complete the AWV; ? About 11am (Apt with Dr. Quay Burow is at 10:30)

## 2015-11-24 ENCOUNTER — Ambulatory Visit (INDEPENDENT_AMBULATORY_CARE_PROVIDER_SITE_OTHER): Payer: Medicare Other | Admitting: Internal Medicine

## 2015-11-24 ENCOUNTER — Other Ambulatory Visit (INDEPENDENT_AMBULATORY_CARE_PROVIDER_SITE_OTHER): Payer: Medicare Other

## 2015-11-24 ENCOUNTER — Encounter: Payer: Self-pay | Admitting: Internal Medicine

## 2015-11-24 VITALS — BP 148/90 | HR 69 | Temp 98.4°F | Resp 16 | Wt 172.0 lb

## 2015-11-24 DIAGNOSIS — I10 Essential (primary) hypertension: Secondary | ICD-10-CM

## 2015-11-24 DIAGNOSIS — I480 Paroxysmal atrial fibrillation: Secondary | ICD-10-CM

## 2015-11-24 DIAGNOSIS — K219 Gastro-esophageal reflux disease without esophagitis: Secondary | ICD-10-CM | POA: Diagnosis not present

## 2015-11-24 DIAGNOSIS — Z0000 Encounter for general adult medical examination without abnormal findings: Secondary | ICD-10-CM | POA: Diagnosis not present

## 2015-11-24 LAB — COMPREHENSIVE METABOLIC PANEL
ALBUMIN: 4.3 g/dL (ref 3.5–5.2)
ALK PHOS: 48 U/L (ref 39–117)
ALT: 23 U/L (ref 0–35)
AST: 18 U/L (ref 0–37)
BUN: 20 mg/dL (ref 6–23)
CHLORIDE: 99 meq/L (ref 96–112)
CO2: 33 mEq/L — ABNORMAL HIGH (ref 19–32)
Calcium: 9.7 mg/dL (ref 8.4–10.5)
Creatinine, Ser: 0.74 mg/dL (ref 0.40–1.20)
GFR: 81.3 mL/min (ref >60.00)
GLUCOSE: 100 mg/dL — AB (ref 70–99)
POTASSIUM: 3.8 meq/L (ref 3.5–5.1)
SODIUM: 141 meq/L (ref 135–145)
TOTAL PROTEIN: 7.3 g/dL (ref 6.0–8.3)
Total Bilirubin: 0.5 mg/dL (ref 0.2–1.2)

## 2015-11-24 LAB — CBC WITH DIFFERENTIAL/PLATELET
BASOS PCT: 0.5 % (ref 0.0–3.0)
Basophils Absolute: 0 10*3/uL (ref 0.0–0.1)
EOS PCT: 1.2 % (ref 0.0–5.0)
Eosinophils Absolute: 0.1 10*3/uL (ref 0.0–0.7)
HCT: 38.4 % (ref 36.0–46.0)
HEMOGLOBIN: 13 g/dL (ref 12.0–15.0)
Lymphocytes Relative: 25.9 % (ref 12.0–46.0)
Lymphs Abs: 2.4 10*3/uL (ref 0.7–4.0)
MCHC: 33.8 g/dL (ref 30.0–36.0)
MCV: 99.3 fl (ref 78.0–100.0)
MONOS PCT: 10.9 % (ref 3.0–12.0)
Monocytes Absolute: 1 10*3/uL (ref 0.1–1.0)
Neutro Abs: 5.7 10*3/uL (ref 1.4–7.7)
Neutrophils Relative %: 61.5 % (ref 43.0–77.0)
Platelets: 296 10*3/uL (ref 150.0–400.0)
RBC: 3.87 Mil/uL (ref 3.87–5.11)
RDW: 13.6 % (ref 11.5–15.5)
WBC: 9.3 10*3/uL (ref 4.0–10.5)

## 2015-11-24 MED ORDER — OMEPRAZOLE 20 MG PO CPDR: 20.0000 mg | DELAYED_RELEASE_CAPSULE | Freq: Every day | ORAL | Status: DC

## 2015-11-24 NOTE — Assessment & Plan Note (Signed)
Controlled with daily medication Tried to change to zantac, but it did not control her GERD Continue omeprazole daily for now

## 2015-11-24 NOTE — Progress Notes (Signed)
Pre visit review using our clinic review tool, if applicable. No additional management support is needed unless otherwise documented below in the visit note. 

## 2015-11-24 NOTE — Patient Instructions (Addendum)
Start monitoring your blood pressure at home.  Follow up in one year   [name] [name] , Thank you for taking time to come for your Medicare Wellness Visit. I appreciate your ongoing commitment to your health goals. Please review the following plan we discussed and let me know if I can assist you in the future.   These are the goals we discussed: Goals    None      This is a list of the screening recommended for you and due dates:  Health Maintenance  Topic Date Due  . DEXA scan (bone density measurement)  01/18/2017*  . Flu Shot  03/16/2016  . Colon Cancer Screening  09/03/2018  . Tetanus Vaccine  09/20/2019  . Shingles Vaccine  Completed  . Pneumonia vaccines  Completed  *Topic was postponed. The date shown is not the original due date.    Health Maintenance, Female Adopting a healthy lifestyle and getting preventive care can go a long way to promote health and wellness. Talk with your health care provider about what schedule of regular examinations is right for you. This is a good chance for you to check in with your provider about disease prevention and staying healthy. In between checkups, there are plenty of things you can do on your own. Experts have done a lot of research about which lifestyle changes and preventive measures are most likely to keep you healthy. Ask your health care provider for more information. WEIGHT AND DIET  Eat a healthy diet  Be sure to include plenty of vegetables, fruits, low-fat dairy products, and lean protein.  Do not eat a lot of foods high in solid fats, added sugars, or salt.  Get regular exercise. This is one of the most important things you can do for your health.  Most adults should exercise for at least 150 minutes each week. The exercise should increase your heart rate and make you sweat (moderate-intensity exercise).  Most adults should also do strengthening exercises at least twice a week. This is in addition to the moderate-intensity  exercise.  Maintain a healthy weight  Body mass index (BMI) is a measurement that can be used to identify possible weight problems. It estimates body fat based on height and weight. Your health care provider can help determine your BMI and help you achieve or maintain a healthy weight.  For females 71 years of age and older:   A BMI below 18.5 is considered underweight.  A BMI of 18.5 to 24.9 is normal.  A BMI of 25 to 29.9 is considered overweight.  A BMI of 30 and above is considered obese.  Watch levels of cholesterol and blood lipids  You should start having your blood tested for lipids and cholesterol at 75 years of age, then have this test every 5 years.  You may need to have your cholesterol levels checked more often if:  Your lipid or cholesterol levels are high.  You are older than 75 years of age.  You are at high risk for heart disease.  CANCER SCREENING   Lung Cancer  Lung cancer screening is recommended for adults 33-90 years old who are at high risk for lung cancer because of a history of smoking.  A yearly low-dose CT scan of the lungs is recommended for people who:  Currently smoke.  Have quit within the past 15 years.  Have at least a 30-pack-year history of smoking. A pack year is smoking an average of one pack of cigarettes  a day for 1 year.  Yearly screening should continue until it has been 15 years since you quit.  Yearly screening should stop if you develop a health problem that would prevent you from having lung cancer treatment.  Breast Cancer  Practice breast self-awareness. This means understanding how your breasts normally appear and feel.  It also means doing regular breast self-exams. Let your health care provider know about any changes, no matter how small.  If you are in your 20s or 30s, you should have a clinical breast exam (CBE) by a health care provider every 1-3 years as part of a regular health exam.  If you are 14 or  older, have a CBE every year. Also consider having a breast X-ray (mammogram) every year.  If you have a family history of breast cancer, talk to your health care provider about genetic screening.  If you are at high risk for breast cancer, talk to your health care provider about having an MRI and a mammogram every year.  Breast cancer gene (BRCA) assessment is recommended for women who have family members with BRCA-related cancers. BRCA-related cancers include:  Breast.  Ovarian.  Tubal.  Peritoneal cancers.  Results of the assessment will determine the need for genetic counseling and BRCA1 and BRCA2 testing. Cervical Cancer Your health care provider may recommend that you be screened regularly for cancer of the pelvic organs (ovaries, uterus, and vagina). This screening involves a pelvic examination, including checking for microscopic changes to the surface of your cervix (Pap test). You may be encouraged to have this screening done every 3 years, beginning at age 45.  For women ages 39-65, health care providers may recommend pelvic exams and Pap testing every 3 years, or they may recommend the Pap and pelvic exam, combined with testing for human papilloma virus (HPV), every 5 years. Some types of HPV increase your risk of cervical cancer. Testing for HPV may also be done on women of any age with unclear Pap test results.  Other health care providers may not recommend any screening for nonpregnant women who are considered low risk for pelvic cancer and who do not have symptoms. Ask your health care provider if a screening pelvic exam is right for you.  If you have had past treatment for cervical cancer or a condition that could lead to cancer, you need Pap tests and screening for cancer for at least 20 years after your treatment. If Pap tests have been discontinued, your risk factors (such as having a new sexual partner) need to be reassessed to determine if screening should resume. Some  women have medical problems that increase the chance of getting cervical cancer. In these cases, your health care provider may recommend more frequent screening and Pap tests. Colorectal Cancer  This type of cancer can be detected and often prevented.  Routine colorectal cancer screening usually begins at 75 years of age and continues through 75 years of age.  Your health care provider may recommend screening at an earlier age if you have risk factors for colon cancer.  Your health care provider may also recommend using home test kits to check for hidden blood in the stool.  A small camera at the end of a tube can be used to examine your colon directly (sigmoidoscopy or colonoscopy). This is done to check for the earliest forms of colorectal cancer.  Routine screening usually begins at age 81.  Direct examination of the colon should be repeated every 5-10 years through  75 years of age. However, you may need to be screened more often if early forms of precancerous polyps or small growths are found. Skin Cancer  Check your skin from head to toe regularly.  Tell your health care provider about any new moles or changes in moles, especially if there is a change in a mole's shape or color.  Also tell your health care provider if you have a mole that is larger than the size of a pencil eraser.  Always use sunscreen. Apply sunscreen liberally and repeatedly throughout the day.  Protect yourself by wearing long sleeves, pants, a wide-brimmed hat, and sunglasses whenever you are outside. HEART DISEASE, DIABETES, AND HIGH BLOOD PRESSURE   High blood pressure causes heart disease and increases the risk of stroke. High blood pressure is more likely to develop in:  People who have blood pressure in the high end of the normal range (130-139/85-89 mm Hg).  People who are overweight or obese.  People who are African American.  If you are 45-29 years of age, have your blood pressure checked every  3-5 years. If you are 18 years of age or older, have your blood pressure checked every year. You should have your blood pressure measured twice--once when you are at a hospital or clinic, and once when you are not at a hospital or clinic. Record the average of the two measurements. To check your blood pressure when you are not at a hospital or clinic, you can use:  An automated blood pressure machine at a pharmacy.  A home blood pressure monitor.  If you are between 44 years and 46 years old, ask your health care provider if you should take aspirin to prevent strokes.  Have regular diabetes screenings. This involves taking a blood sample to check your fasting blood sugar level.  If you are at a normal weight and have a low risk for diabetes, have this test once every three years after 75 years of age.  If you are overweight and have a high risk for diabetes, consider being tested at a younger age or more often. PREVENTING INFECTION  Hepatitis B  If you have a higher risk for hepatitis B, you should be screened for this virus. You are considered at high risk for hepatitis B if:  You were born in a country where hepatitis B is common. Ask your health care provider which countries are considered high risk.  Your parents were born in a high-risk country, and you have not been immunized against hepatitis B (hepatitis B vaccine).  You have HIV or AIDS.  You use needles to inject street drugs.  You live with someone who has hepatitis B.  You have had sex with someone who has hepatitis B.  You get hemodialysis treatment.  You take certain medicines for conditions, including cancer, organ transplantation, and autoimmune conditions. Hepatitis C  Blood testing is recommended for:  Everyone born from [birth year] through 1965.  Anyone with known risk factors for hepatitis C. Sexually transmitted infections (STIs)  You should be screened for sexually transmitted infections (STIs) including  gonorrhea and chlamydia if:  You are sexually active and are younger than 75 years of age.  You are older than 75 years of age and your health care provider tells you that you are at risk for this type of infection.  Your sexual activity has changed since you were last screened and you are at an increased risk for chlamydia or gonorrhea. Ask your health care  provider if you are at risk.  If you do not have HIV, but are at risk, it may be recommended that you take a prescription medicine daily to prevent HIV infection. This is called pre-exposure prophylaxis (PrEP). You are considered at risk if:  You are sexually active and do not regularly use condoms or know the HIV status of your partner(s).  You take drugs by injection.  You are sexually active with a partner who has HIV. Talk with your health care provider about whether you are at high risk of being infected with HIV. If you choose to begin PrEP, you should first be tested for HIV. You should then be tested every 3 months for as long as you are taking PrEP.  PREGNANCY   If you are premenopausal and you may become pregnant, ask your health care provider about preconception counseling.  If you may become pregnant, take 400 to 800 micrograms (mcg) of folic acid every day.  If you want to prevent pregnancy, talk to your health care provider about birth control (contraception). OSTEOPOROSIS AND MENOPAUSE   Osteoporosis is a disease in which the bones lose minerals and strength with aging. This can result in serious bone fractures. Your risk for osteoporosis can be identified using a bone density scan.  If you are 60 years of age or older, or if you are at risk for osteoporosis and fractures, ask your health care provider if you should be screened.  Ask your health care provider whether you should take a calcium or vitamin D supplement to lower your risk for osteoporosis.  Menopause may have certain physical symptoms and  risks.  Hormone replacement therapy may reduce some of these symptoms and risks. Talk to your health care provider about whether hormone replacement therapy is right for you.  HOME CARE INSTRUCTIONS   Schedule regular health, dental, and eye exams.  Stay current with your immunizations.   Do not use any tobacco products including cigarettes, chewing tobacco, or electronic cigarettes.  If you are pregnant, do not drink alcohol.  If you are breastfeeding, limit how much and how often you drink alcohol.  Limit alcohol intake to no more than 1 drink per day for nonpregnant women. One drink equals 12 ounces of beer, 5 ounces of wine, or 1 ounces of hard liquor.  Do not use street drugs.  Do not share needles.  Ask your health care provider for help if you need support or information about quitting drugs.  Tell your health care provider if you often feel depressed.  Tell your health care provider if you have ever been abused or do not feel safe at home.   This information is not intended to replace advice given to you by your health care provider. Make sure you discuss any questions you have with your health care provider.   Document Released: 02/15/2011 Document Revised: 08/23/2014 Document Reviewed: 07/04/2013 Elsevier Interactive Patient Education [employer].

## 2015-11-24 NOTE — Assessment & Plan Note (Signed)
BP elevated here today  - asked her to start monitoring at home regularly Will call me or Dr Stanford Breed if elevated Check cmp

## 2015-11-24 NOTE — Progress Notes (Signed)
Subjective:    Patient ID: [name] [name], female    DOB: 04-21-1941, 75 y.o.   MRN: [id number]  HPI She is here to establish with a new pcp.    Here for medicare wellness exam.   A couple of weeks ago she woke up with feeling fatigue, shakey and felt a fluttering in her chest.  She did go to the ED and was on a cardizem drip.   She converted in the ED.  Her potassium was low.  She was placed on a blood thinner.  She has occasional palpitations that are transient, but denies any recent chest pain.  Hypertension: She is taking her medication daily. She is compliant with a low sodium diet.  She is exercising regularly.  She does not monitor her blood pressure at home regularly, but will check it occasionally.     I have personally reviewed and have noted 1.The patient's medical and social history 2.Their use of alcohol, tobacco or illicit drugs 3.Their current medications and supplements 4.The patient's functional ability including ADL's, fall risks, home safety risks and                 hearing or visual impairment. 5.Diet and physical activities 6.Evidence for depression or mood disorders 7.Care team reviewed and updated : Dr Stanford Breed for cardiology, Dr Silverio Decamp for [facility], dr Rozann Lesches for dermatology, dentist, Dr Durward Fortes for Carl Best - doe snot recall her name   Are there smokers in your home (other than you)? No  Risk Factors Exercise: walking, hiking gardening, less active in winter Dietary issues discussed: healthy diet, craves salty foods, can avoid sweets, does not eat much meat, does not snack  Cardiac risk factors: advanced age (older than 30 for men, 71 for women), hypertension, hyperlipidemia, Afib  Depression Screen  Have you felt down, depressed or hopeless? No  Have you felt little interest or pleasure in doing things?  No Activities of Daily Living In your present state of health, do you have any  difficulty performing the following activities?:  Driving? No Managing money?  No Feeding yourself? No Getting from bed to chair? No Climbing a flight of stairs? No Preparing food and eating?: No Bathing or showering? No Getting dressed: No Getting to/using the toilet? No Moving around from place to place: No In the past year have you fallen or had a near fall?: No - she is careful  Does everything at home because her husband has started to have memory issues.     Are you sexually active?  No  Do you have more than one partner?  N/A  Hearing Difficulties: No Do you often ask people to speak up or repeat themselves? No Do you experience ringing or noises in your ears? No Do you have difficulty understanding soft or whispered voices? No Vision:              Any change in vision:  No              Up to date with eye exam:  Up to date Memory:  Do you feel that you have a problem with memory? No  Do you often misplace items? No  Do you feel safe at home?  Yes  Cognitive Testing  Alert, Orientated? Yes  Normal Appearance? Yes  Recall of three objects?  Yes  Can perform simple calculations? Yes  Displays appropriate judgment? Yes  Can read the correct time from a watch face? Yes  Advanced Directives have been discussed with the patient? Yes, discussed  Medications and allergies reviewed with patient and updated if appropriate.  Patient Active Problem List   Diagnosis Date Noted  . Fatigue 05/29/2015  . Knee joint replacement by other means 09/30/2013  . Osteoarthritis of right knee 09/21/2013  . S/P total knee replacement using cement 09/18/2013  . Anisocoria 07/26/2013  . Knee osteoarthritis 06/09/2012  . Varicose veins of lower extremities with other complications [id number]  . Paroxysmal Atrial Fibrillation 06/29/2011  . Hypokalemia 06/29/2011  . COUGH, CHRONIC 03/09/2010  . HIP PAIN, BILATERAL 07/17/2009  . CHEST PAIN 03/31/2009  . ALLERGIC RHINITIS 04/23/2008  .  GERD 11/01/2007  . Essential hypertension 09/20/2007  . SKIN CANCER, LEG 07/03/2007  . HYPERLIPIDEMIA 07/03/2007  . COLLAGENOUS COLITIS 07/03/2007  . POLIOMYELITIS, HX OF 07/03/2007  . ANEMIA, HX OF 07/03/2007    Current Outpatient Prescriptions on File Prior to Visit  Medication Sig Dispense Refill  . bisoprolol (ZEBETA) 5 MG tablet Take 0.5 tablets (2.5 mg total) by mouth daily. 45 tablet 3  . budesonide (ENTOCORT EC) 3 MG 24 hr capsule Take 3 capsules 3 times daily 90 capsule 6  . chlorthalidone (HYGROTON) 25 MG tablet Take 1 tablet (25 mg total) by mouth daily. 90 tablet 3  . dabigatran (PRADAXA) 150 MG CAPS capsule Take 1 capsule (150 mg total) by mouth every 12 (twelve) hours. 180 capsule 3  . diltiazem (CARDIZEM CD) 180 MG 24 hr capsule Take 1 capsule (180 mg total) by mouth daily. 90 capsule 3  . Doxylamine Succinate, Sleep, (UNISOM PO) Take by mouth at bedtime as needed.    Marland Kitchen omeprazole (PRILOSEC) 20 MG capsule Take 1 capsule (20 mg total) by mouth daily. 90 capsule 3  . potassium chloride (K-DUR) 10 MEQ tablet Take 1 tablet (10 mEq total) by mouth 2 (two) times daily. 180 tablet 3   No current facility-administered medications on file prior to visit.    Past Medical History  Diagnosis Date  . Hyperlipidemia     was on Pravastatin but joint pain;has been off for 2-35months ;takes CoQ10  . Allergic rhinitis     takes Benadryl at bedtime  . Cancer of skin of leg   . History of poliomyelitis     Polio  age 58- no significant neuromuscular deficit  . Collagenous colitis     recurrent, takes Budesonide daily as needed   . Potassium deficiency     takes KDUR daily  . Arthritis   . GERD (gastroesophageal reflux disease)     takes Omeprazole daily  . Atrial fibrillation ([facility]) 06-10-2011    takes Diltiazem and Pradaxa daily  . Hypertension     takes Bisoprolol and Chlorthalidone daily  . Cough     states every day of her life and every chest xray is always clear  . Colon  polyps 01/14/2010    Hyperplastic    Past Surgical History  Procedure Laterality Date  . Breast biopsy  1947 & 10/07    Benign  . Tonsillectomy      as a child  . Skin cancer excision      removal of skin cancer on left leg  . Endovenous ablation saphenous vein w/ laser  03-03-2011 left greater saphenous vein   . Endovenous ablation saphenous vein w/ laser  01-06-2011  right greater saphenous vein  . Appendectomy      at age 9  . Colonoscopy    . Epidural infection  x 2   . Cataracts removed    . Total knee arthroplasty Right 09/18/2013    Procedure: TOTAL KNEE ARTHROPLASTY;  Surgeon: Garald Balding, MD;  Location: [facility];  Service: Orthopedics;  Laterality: Right;    Social History   Social History  . Marital Status: Married    Spouse Name: N/A  . Number of Children: N/A  . Years of Education: N/A   Social History Main Topics  . Smoking status: Former Smoker -- 18 years    Types: Cigarettes  . Smokeless tobacco: Never Used     Comment: quit smoking 33yrs ago  . Alcohol Use: 0.0 oz/week     Comment: glass of wine daily  . Drug Use: No  . Sexual Activity: Yes    Birth Control/ Protection: Surgical   Other Topics Concern  . 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 Alcohol abuse Father   . Heart disease Father   . Stroke Father   . Hypertension Father   . Colon cancer Maternal Grandmother   . Hypertension Sister   . Other Sister     varicose veins  . Stomach cancer Maternal Grandfather   . Heart disease Paternal Grandfather   . Peripheral vascular disease Mother   . Other Mother     varicose veins  . Cancer Son     colorectal    Review of Systems  Constitutional: Negative for fever, chills, appetite  change, fatigue and unexpected weight change.  HENT: Negative for hearing loss.   Eyes: Negative for visual disturbance.  Respiratory: Positive for cough (chronic in morning only). Negative for shortness of breath and wheezing.   Cardiovascular: Positive for palpitations (last episode two weeks ago, none since then) and leg swelling (chronic edema). Negative for chest pain.  Gastrointestinal: Positive for diarrhea (with collitis). Negative for nausea, abdominal pain, constipation and blood in stool.       Gerd rarely - takes prilosec daily  Genitourinary: Negative for dysuria and hematuria.  Musculoskeletal: Positive for arthralgias. Negative for back pain.  Skin: Negative for color change and rash.  Neurological: Positive for headaches (rare). Negative for dizziness and light-headedness.  Hematological: Bruises/bleeds easily (since being on blood thinner).  Psychiatric/Behavioral: Negative for dysphoric mood. The patient is not nervous/anxious.        Objective:   Filed Vitals:   11/24/15 1036  BP: 148/90  Pulse: 69  Temp: 98.4 F (36.9 C)  Resp: 16   Filed Weights   11/24/15 1036  Weight: 172 lb (78.019 kg)   Body mass index is 29.29 kg/(m^2).   Physical Exam Constitutional: She appears well-developed and well-nourished. No distress.  HENT:  Head: Normocephalic and atraumatic.  Right Ear: External ear normal. Normal ear canal and TM Left Ear: External ear normal.  Normal ear canal and TM Mouth/Throat: Oropharynx is clear and moist.  Eyes: Conjunctivae and EOM are normal.  Neck: Neck supple. No tracheal deviation present. No thyromegaly present.  No carotid bruit  Cardiovascular: Normal rate, regular rhythm and normal heart sounds.   No murmur heard.  trace edema. Varicose veins b/l LE Pulmonary/Chest: Effort normal  and breath sounds normal. No respiratory distress. She has no wheezes. She has no rales.  Breast: deferred to Gyn Abdominal: Soft. She exhibits no  distension. There is no tenderness.  Lymphadenopathy: She has no cervical adenopathy.  Skin: Skin is warm and dry. She is not diaphoretic.  Psychiatric: She has a normal mood and affect. Her behavior is normal.       Assessment & Plan:   Wellness Exam: Immunizations Up to date  Colonoscopy Up to date  Mammogram  Up to date  dexa  Up to date  Gyn   Up to date  Eye exam up to date Hearing loss    none Memory concerns/difficulties  None.  Discussed ways to help prevent memory loss Exercising regularly Independent of ADLs  Completely independent   Physical exam: Screening blood work  ordered Immunizations    Up to date  Colonoscopy  Up to date  Mammogram  Up to date  Gyn   Up to date  Dexa  Up to date  Eye exams  Up to date EKG - done 05/2015 by cardiology Exercise gardening, walking, hiking - stressed regular exercise Weight - overweight, work on weight loss Skin  - no active concerns, has seen derm Substance abuse  - none   See Problem List for Assessment and Plan of chronic medical problems.  Follow up annually

## 2015-11-24 NOTE — Assessment & Plan Note (Signed)
In sinus today, rate controlled On pradaxa Check cbc, cmp today Will see Dr. Stanford Breed in 6 months

## 2015-12-25 LAB — HM MAMMOGRAPHY

## 2015-12-26 ENCOUNTER — Encounter: Payer: Self-pay | Admitting: Internal Medicine

## 2016-03-10 ENCOUNTER — Encounter (HOSPITAL_COMMUNITY): Payer: Self-pay | Admitting: Emergency Medicine

## 2016-03-10 ENCOUNTER — Ambulatory Visit (HOSPITAL_COMMUNITY)
Admission: EM | Admit: 2016-03-10 | Discharge: 2016-03-10 | Disposition: A | Discharge status: Home / Self Care | Payer: Medicare Other | Attending: Family Medicine | Admitting: Family Medicine

## 2016-03-10 DIAGNOSIS — R21 Rash and other nonspecific skin eruption: Secondary | ICD-10-CM | POA: Diagnosis not present

## 2016-03-10 MED ORDER — CEPHALEXIN 250 MG PO CAPS: 250.0000 mg | ORAL_CAPSULE | Freq: Four times a day (QID) | ORAL | 0 refills | Status: DC

## 2016-03-10 NOTE — ED Triage Notes (Signed)
The patient presented to the [facility] with a complaint of a rash on her chest that has been there for 2 weeks but is growing in intensity recently.

## 2016-03-11 NOTE — ED Provider Notes (Signed)
CSN: [id number]     Arrival date & time 03/10/16  1230 History   First MD Initiated Contact with Patient 03/10/16 1303     Chief Complaint  Patient presents with  . Rash   (Consider location/radiation/quality/duration/timing/severity/associated sxs/prior Treatment) HPI 75 year old female patient with a rash on her chest between breast that she is worried that it is a staph infection. She states that it started out as 1 small lesion and now is several red bumps. There is no pain. Does itch quite a bit she states that she has not used any home treatment including Benadryl. Past Medical History:  Diagnosis Date  . Allergic rhinitis    takes Benadryl at bedtime  . Arthritis   . Atrial fibrillation ([place]) 06-10-2011   takes Diltiazem and Pradaxa daily  . Cancer of skin of leg   . Collagenous colitis    recurrent, takes Budesonide daily as needed   . Colon polyps 01/14/2010   Hyperplastic  . Cough    states every day of her life and every chest xray is always clear  . GERD (gastroesophageal reflux disease)    takes Omeprazole daily  . History of poliomyelitis    Polio  age 36- no significant neuromuscular deficit  . Hyperlipidemia    was on Pravastatin but joint pain;has been off for 2-24months ;takes CoQ10  . Hypertension    takes Bisoprolol and Chlorthalidone daily  . Potassium deficiency    takes KDUR daily   Past Surgical History:  Procedure Laterality Date  . APPENDECTOMY     at age 23  . BREAST BIOPSY  1947 & 10/07   Benign  . cataracts removed    . COLONOSCOPY    . ENDOVENOUS ABLATION SAPHENOUS VEIN W/ LASER  03-03-2011 left greater saphenous vein   . ENDOVENOUS ABLATION SAPHENOUS VEIN W/ LASER  01-06-2011  right greater saphenous vein  . epidural infection     x 2   . SKIN CANCER EXCISION     removal of skin cancer on left leg  . TONSILLECTOMY     as a child  . TOTAL KNEE ARTHROPLASTY Right 09/18/2013   Procedure: TOTAL KNEE ARTHROPLASTY;  Surgeon: Garald Balding,  MD;  Location: [facility];  Service: Orthopedics;  Laterality: Right;   Family History  Problem Relation Age of Onset  . Alcohol abuse Father   . Heart disease Father   . Stroke Father   . Hypertension Father   . Colon cancer Maternal Grandmother   . Hypertension Sister   . Other Sister     varicose veins  . Stomach cancer Maternal Grandfather   . Heart disease Paternal Grandfather   . Peripheral vascular disease Mother   . Other Mother     varicose veins  . Cancer Son     colorectal   Social History  Substance Use Topics  . Smoking status: Former Smoker    Years: 18.00    Types: Cigarettes  . Smokeless tobacco: Never Used     Comment: quit smoking 34yrs ago  . Alcohol use 0.0 oz/week     Comment: glass of wine daily   OB History    No data available     Review of Systems  Denies: HEADACHE, NAUSEA, ABDOMINAL PAIN, CHEST PAIN, CONGESTION, DYSURIA, SHORTNESS OF BREATH  Allergies  Augmentin [amoxicillin-pot clavulanate]; Levaquin [levofloxacin in d5w]; Polysporin [bacitracin-polymyxin b]; Pravastatin; Sulfonamide derivatives; and Erythromycin  Home Medications   Prior to Admission medications   Medication Sig Start  Date End Date Taking? Authorizing Provider  bisoprolol (ZEBETA) 5 MG tablet Take 0.5 tablets (2.5 mg total) by mouth daily. 05/29/15  Yes Lelon Perla, MD  chlorthalidone (HYGROTON) 25 MG tablet Take 1 tablet (25 mg total) by mouth daily. 05/29/15  Yes Lelon Perla, MD  dabigatran (PRADAXA) 150 MG CAPS capsule Take 1 capsule (150 mg total) by mouth every 12 (twelve) hours. 05/29/15  Yes Lelon Perla, MD  diltiazem (CARDIZEM CD) 180 MG 24 hr capsule Take 1 capsule (180 mg total) by mouth daily. 05/29/15  Yes Lelon Perla, MD  Doxylamine Succinate, Sleep, (UNISOM PO) Take by mouth at bedtime as needed.   Yes Historical Provider, MD  omeprazole (PRILOSEC) 20 MG capsule Take 1 capsule (20 mg total) by mouth daily. 11/24/15  Yes Binnie Rail, MD   potassium chloride (K-DUR) 10 MEQ tablet Take 1 tablet (10 mEq total) by mouth 2 (two) times daily. 05/29/15  Yes Lelon Perla, MD  budesonide (ENTOCORT EC) 3 MG 24 hr capsule Take 3 capsules 3 times daily 07/14/15   Amy S Esterwood, PA-C  cephALEXin (KEFLEX) 250 MG capsule Take 1 capsule (250 mg total) by mouth 4 (four) times daily. 03/10/16   Konrad Felix, PA   Meds Ordered and Administered this Visit  Medications - No data to display  BP 153/89 (BP Location: Left Arm)   Pulse 94   Temp 98.4 F (36.9 C) (Oral)   Resp 18   Ht 5' 6.5" (1.689 m)   Wt 166 lb 8 oz (75.5 kg)   SpO2 97%   BMI 26.47 kg/m  No data found.   Physical Exam NURSES NOTES AND VITAL SIGNS REVIEWED. CONSTITUTIONAL: Well developed, well nourished, no acute distress HEENT: normocephalic, atraumatic EYES: Conjunctiva normal NECK:normal ROM, supple, no adenopathy PULMONARY:No respiratory distress, normal effort ABDOMINAL: Soft, ND, NT BS+, No CVAT MUSCULOSKELETAL: Normal ROM of all extremities,  SKIN: warm and dry red raised rash, does appear like a staph infection, no signs of shingles.  PSYCHIATRIC: Mood and affect, behavior are normal  Urgent Care Course   Clinical Course    Procedures (including critical care time)  Labs Review Labs Reviewed - No data to display  Imaging Review No results found.   Visual Acuity Review  Right Eye Distance:   Left Eye Distance:   Bilateral Distance:    Right Eye Near:   Left Eye Near:    Bilateral Near:       Prescription for Keflex is provided to the patient. She is advised to follow-up with her primary care provider to no worsening of symptoms.  MDM   1. Rash     Patient is reassured that there are no issues that require transfer to higher level of care at this time or additional tests. Patient is advised to continue home symptomatic treatment. Patient is advised that if there are new or worsening symptoms to attend the emergency department,  contact primary care provider, or return to UC. Instructions of care provided discharged home in stable condition.    THIS NOTE WAS GENERATED USING A VOICE RECOGNITION SOFTWARE PROGRAM. ALL REASONABLE EFFORTS  WERE MADE TO PROOFREAD THIS DOCUMENT FOR ACCURACY.  I have verbally reviewed the discharge instructions with the patient. A printed AVS was given to the patient.  All questions were answered prior to discharge.      Konrad Felix, [place] 03/11/16 [phone]

## 2016-03-26 ENCOUNTER — Ambulatory Visit: Payer: Medicare Other | Admitting: Internal Medicine

## 2016-03-29 ENCOUNTER — Ambulatory Visit: Payer: Medicare Other | Admitting: Internal Medicine

## 2016-04-13 ENCOUNTER — Encounter: Payer: Self-pay | Admitting: Internal Medicine

## 2016-04-13 ENCOUNTER — Ambulatory Visit (INDEPENDENT_AMBULATORY_CARE_PROVIDER_SITE_OTHER): Payer: Medicare Other | Admitting: Internal Medicine

## 2016-04-13 VITALS — BP 110/72 | HR 56 | Temp 98.0°F | Wt 164.0 lb

## 2016-04-13 DIAGNOSIS — R05 Cough: Secondary | ICD-10-CM | POA: Diagnosis not present

## 2016-04-13 DIAGNOSIS — R600 Localized edema: Secondary | ICD-10-CM | POA: Insufficient documentation

## 2016-04-13 DIAGNOSIS — R059 Cough, unspecified: Secondary | ICD-10-CM

## 2016-04-13 MED ORDER — FUROSEMIDE 20 MG PO TABS: 20.0000 mg | ORAL_TABLET | Freq: Every day | ORAL | 3 refills | Status: DC | PRN

## 2016-04-13 MED ORDER — RANITIDINE HCL 150 MG PO TABS: 150.0000 mg | ORAL_TABLET | Freq: Two times a day (BID) | ORAL | 5 refills | Status: DC

## 2016-04-13 NOTE — Progress Notes (Signed)
Pre visit review using our clinic review tool, if applicable. No additional management support is needed unless otherwise documented below in the visit note. 

## 2016-04-13 NOTE — Assessment & Plan Note (Addendum)
B/l legs Likely multifactorial - varicose veins/venous insuff, sitting long hours, no regular exercise, increased salt intake No evidence of heart failure on exam, no sob or orthopnea Will check cbc, cmp, bnp She has compression socks and will start wearing them Low sodium diet, elevate legs when able, exercise Lasix 20 mg daily as needed

## 2016-04-13 NOTE — Assessment & Plan Note (Signed)
Going on for at least a few months - ? Related to GERD Initially improved with switching prilosec from am to pm Likely GERD, given lack of other respiratory symptoms Change omeprazole to am - 30 min prior to breakfast Start zantac 150 mg BID  Once well controlled decrease zantac to QHS only Call or return if no improvement

## 2016-04-13 NOTE — Patient Instructions (Signed)
  Test(s) ordered today. Your results will be released to [facility] (or called to you) after review, usually within 72hours after test completion. If any changes need to be made, you will be notified at that same time.   Medications reviewed and updated.  Changes include switching omeprazole to the morning - take 30 minutes prior to your meal.  Take zantac twice daily and taper off slowly when you symptoms are controlled.   Take the lasix daily as needed. Use compression socks daily.    Your prescription(s) have been submitted to your pharmacy. Please take as directed and contact our office if you believe you are having problem(s) with the medication(s).

## 2016-04-13 NOTE — Progress Notes (Signed)
Subjective:    Patient ID: [name] [name], female    DOB: 1940-12-31, 75 y.o.   MRN: [id number]  HPI She is here for an acute visit.   Edema:  It started in the middle of July and it has not gone away.  She has had this in the past, but it usually goes down after decreasing her sodium intake.  Over the summer she was consuming more salt when family was visiting.  She has stopped all sodium consumption and it has not improved.  The skin gets rough in her lower ankles.  Her rings in her hands are not tight so she does not think it is all salt intake. She does have a history of varicose veins and had an ablation in the past.  She has not been able to exercise regularly and knows that would help.  She has been sitting more.   No change in exercise tolerance.  She denies sob with laying down at night. She denies any dyspnea with exertion, chest pain or palpitations.    She was given a prescription for lasix 20 mg years ago and was advised to take it daily as needed and the past few days she has taken it daily.  She does not feel it has helped.    Productive cough:  She has had it for a while - months.  She does not feel sick.  The phlegm is while. She clears there throat a lot.  She wonders if it is related to GERD.  She was having GERD symptoms and started taking prilosec at night instead of the morning and it helped initially.    Medications and allergies reviewed with patient and updated if appropriate.  Patient Active Problem List   Diagnosis Date Noted  . Fatigue 05/29/2015  . Osteoarthritis of right knee 09/21/2013  . S/P total knee replacement using cement 09/18/2013  . Anisocoria 07/26/2013  . Knee osteoarthritis 06/09/2012  . Varicose veins of lower extremities with other complications [id number]  . Paroxysmal Atrial Fibrillation 06/29/2011  . Hypokalemia 06/29/2011  . COUGH, CHRONIC 03/09/2010  . HIP PAIN, BILATERAL 07/17/2009  . CHEST PAIN 03/31/2009  . ALLERGIC RHINITIS  04/23/2008  . GERD 11/01/2007  . Essential hypertension 09/20/2007  . SKIN CANCER, LEG 07/03/2007  . HYPERLIPIDEMIA 07/03/2007  . COLLAGENOUS COLITIS 07/03/2007  . POLIOMYELITIS, HX OF 07/03/2007    Current Outpatient Prescriptions on File Prior to Visit  Medication Sig Dispense Refill  . bisoprolol (ZEBETA) 5 MG tablet Take 0.5 tablets (2.5 mg total) by mouth daily. 45 tablet 3  . chlorthalidone (HYGROTON) 25 MG tablet Take 1 tablet (25 mg total) by mouth daily. 90 tablet 3  . dabigatran (PRADAXA) 150 MG CAPS capsule Take 1 capsule (150 mg total) by mouth every 12 (twelve) hours. 180 capsule 3  . diltiazem (CARDIZEM CD) 180 MG 24 hr capsule Take 1 capsule (180 mg total) by mouth daily. 90 capsule 3  . Doxylamine Succinate, Sleep, (UNISOM PO) Take by mouth at bedtime as needed.    Marland Kitchen omeprazole (PRILOSEC) 20 MG capsule Take 1 capsule (20 mg total) by mouth daily. 90 capsule 3  . potassium chloride (K-DUR) 10 MEQ tablet Take 1 tablet (10 mEq total) by mouth 2 (two) times daily. 180 tablet 3   No current facility-administered medications on file prior to visit.     Past Medical History:  Diagnosis Date  . Allergic rhinitis    takes Benadryl at bedtime  . Arthritis   .  Atrial fibrillation ([place]) 06-10-2011   takes Diltiazem and Pradaxa daily  . Cancer of skin of leg   . Collagenous colitis    recurrent, takes Budesonide daily as needed   . Colon polyps 01/14/2010   Hyperplastic  . Cough    states every day of her life and every chest xray is always clear  . GERD (gastroesophageal reflux disease)    takes Omeprazole daily  . History of poliomyelitis    Polio  age 79- no significant neuromuscular deficit  . Hyperlipidemia    was on Pravastatin but joint pain;has been off for 2-30months ;takes CoQ10  . Hypertension    takes Bisoprolol and Chlorthalidone daily  . Potassium deficiency    takes KDUR daily    Past Surgical History:  Procedure Laterality Date  . APPENDECTOMY      at age 5  . BREAST BIOPSY  1947 & 10/07   Benign  . cataracts removed    . COLONOSCOPY    . ENDOVENOUS ABLATION SAPHENOUS VEIN W/ LASER  03-03-2011 left greater saphenous vein   . ENDOVENOUS ABLATION SAPHENOUS VEIN W/ LASER  01-06-2011  right greater saphenous vein  . epidural infection     x 2   . SKIN CANCER EXCISION     removal of skin cancer on left leg  . TONSILLECTOMY     as a child  . TOTAL KNEE ARTHROPLASTY Right 09/18/2013   Procedure: TOTAL KNEE ARTHROPLASTY;  Surgeon: Garald Balding, MD;  Location: [facility];  Service: Orthopedics;  Laterality: Right;    Social History   Social History  . Marital status: Married    Spouse name: N/A  . Number of children: N/A  . Years of education: N/A   Social History Main Topics  . Smoking status: Former Smoker    Years: 18.00    Types: Cigarettes  . Smokeless tobacco: Never Used     Comment: quit smoking 81yrs ago  . Alcohol use 0.0 oz/week     Comment: glass of wine daily  . Drug use: No  . Sexual activity: Yes    Birth control/ protection: Surgical   Other Topics Concern  . 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 Alcohol abuse Father   . Heart disease Father   . Stroke Father   . Hypertension Father   . Colon cancer Maternal Grandmother   . Hypertension Sister   . Other Sister     varicose veins  . Stomach cancer Maternal Grandfather   . Heart disease Paternal Grandfather   . Peripheral vascular disease Mother   . Other Mother     varicose veins  . Cancer Son     colorectal    Review of Systems  Constitutional: Negative for chills and fever.  Respiratory: Positive for cough (productive of white phlegm). Negative for shortness of breath and wheezing.     Cardiovascular: Positive for leg swelling. Negative for chest pain and palpitations.  Gastrointestinal: Negative for abdominal pain and nausea.       GERD occasional with omeprazole once daily  Neurological: Negative for dizziness, light-headedness and headaches.  Objective:   Vitals:   04/13/16 1645  BP: 110/72  Pulse: (!) 56  Temp: 98 F (36.7 C)   Filed Weights   04/13/16 1645  Weight: 164 lb (74.4 kg)   Body mass index is 26.07 kg/m.   Physical Exam Constitutional: Appears well-developed and well-nourished. No distress.  HENT:  Head: Normocephalic and atraumatic.  Neck: Neck supple. No tracheal deviation present. No thyromegaly present.  Cardiovascular: Normal rate, regular rhythm and normal heart sounds.   2/6 systolic murmur heard. No carotid bruit  Pulmonary/Chest: Effort normal and breath sounds normal. No respiratory distress. No has no wheezes. No rales.  Musculoskeletal/vascular : 1+ non pitting edema.  Varicose veins LE b/l.  Lymphadenopathy: No cervical adenopathy.  Skin: Skin is warm and dry. Not diaphoretic. feet and lower extremities dusky appearance Psychiatric: Normal mood and affect. Behavior is normal.         Assessment & Plan:   See Problem List for Assessment and Plan of chronic medical problems.

## 2016-04-14 ENCOUNTER — Other Ambulatory Visit (INDEPENDENT_AMBULATORY_CARE_PROVIDER_SITE_OTHER): Payer: Medicare Other

## 2016-04-14 DIAGNOSIS — R600 Localized edema: Secondary | ICD-10-CM | POA: Diagnosis not present

## 2016-04-14 LAB — COMPREHENSIVE METABOLIC PANEL
ALBUMIN: 4.3 g/dL (ref 3.5–5.2)
ALK PHOS: 58 U/L (ref 39–117)
ALT: 19 U/L (ref 0–35)
AST: 20 U/L (ref 0–37)
BILIRUBIN TOTAL: 0.5 mg/dL (ref 0.2–1.2)
BUN: 18 mg/dL (ref 6–23)
CO2: 33 mEq/L — ABNORMAL HIGH (ref 19–32)
Calcium: 9.2 mg/dL (ref 8.4–10.5)
Chloride: 98 mEq/L (ref 96–112)
Creatinine, Ser: 0.76 mg/dL (ref 0.40–1.20)
GFR: 78.75 mL/min (ref >60.00)
Glucose, Bld: 111 mg/dL — ABNORMAL HIGH (ref 70–99)
POTASSIUM: 3.1 meq/L — AB (ref 3.5–5.1)
Sodium: 139 mEq/L (ref 135–145)
Total Protein: 7.5 g/dL (ref 6.0–8.3)

## 2016-04-14 LAB — CBC WITH DIFFERENTIAL/PLATELET
BASOS ABS: 0 10*3/uL (ref 0.0–0.1)
Basophils Relative: 0.6 % (ref 0.0–3.0)
EOS PCT: 3.5 % (ref 0.0–5.0)
Eosinophils Absolute: 0.3 10*3/uL (ref 0.0–0.7)
HCT: 39 % (ref 36.0–46.0)
HEMOGLOBIN: 13.5 g/dL (ref 12.0–15.0)
Lymphocytes Relative: 26.4 % (ref 12.0–46.0)
Lymphs Abs: 1.9 10*3/uL (ref 0.7–4.0)
MCHC: 34.5 g/dL (ref 30.0–36.0)
MCV: 98.5 fl (ref 78.0–100.0)
MONO ABS: 0.9 10*3/uL (ref 0.1–1.0)
MONOS PCT: 11.7 % (ref 3.0–12.0)
NEUTROS PCT: 57.8 % (ref 43.0–77.0)
Neutro Abs: 4.2 10*3/uL (ref 1.4–7.7)
Platelets: 301 10*3/uL (ref 150.0–400.0)
RBC: 3.96 Mil/uL (ref 3.87–5.11)
RDW: 13 % (ref 11.5–15.5)
WBC: 7.3 10*3/uL (ref 4.0–10.5)

## 2016-04-14 LAB — BRAIN NATRIURETIC PEPTIDE: Pro B Natriuretic peptide (BNP): 180 pg/mL — ABNORMAL HIGH (ref 0.0–100.0)

## 2016-04-16 ENCOUNTER — Other Ambulatory Visit: Payer: Self-pay | Admitting: Internal Medicine

## 2016-04-16 ENCOUNTER — Encounter: Payer: Self-pay | Admitting: Emergency Medicine

## 2016-04-16 DIAGNOSIS — R7989 Other specified abnormal findings of blood chemistry: Secondary | ICD-10-CM

## 2016-04-16 DIAGNOSIS — R600 Localized edema: Secondary | ICD-10-CM

## 2016-04-16 MED ORDER — POTASSIUM CHLORIDE ER 10 MEQ PO TBCR: 20.0000 meq | EXTENDED_RELEASE_TABLET | Freq: Two times a day (BID) | ORAL | 3 refills | Status: DC

## 2016-05-14 ENCOUNTER — Other Ambulatory Visit: Payer: Self-pay

## 2016-05-14 DIAGNOSIS — I4891 Unspecified atrial fibrillation: Secondary | ICD-10-CM

## 2016-05-14 DIAGNOSIS — I10 Essential (primary) hypertension: Secondary | ICD-10-CM

## 2016-05-14 DIAGNOSIS — I480 Paroxysmal atrial fibrillation: Secondary | ICD-10-CM

## 2016-05-14 MED ORDER — DILTIAZEM HCL ER COATED BEADS 180 MG PO CP24: 180.0000 mg | ORAL_CAPSULE | Freq: Every day | ORAL | 3 refills | Status: DC

## 2016-05-14 MED ORDER — CHLORTHALIDONE 25 MG PO TABS: 25.0000 mg | ORAL_TABLET | Freq: Every day | ORAL | 3 refills | Status: DC

## 2016-05-14 MED ORDER — POTASSIUM CHLORIDE ER 10 MEQ PO TBCR: 20.0000 meq | EXTENDED_RELEASE_TABLET | Freq: Two times a day (BID) | ORAL | 0 refills | Status: DC

## 2016-05-14 MED ORDER — BISOPROLOL FUMARATE 5 MG PO TABS
2.5000 mg | ORAL_TABLET | Freq: Every day | ORAL | 3 refills | Status: DC
Start: 2016-05-14 — End: 2016-05-14

## 2016-05-14 MED ORDER — BISOPROLOL FUMARATE 5 MG PO TABS
2.5000 mg | ORAL_TABLET | Freq: Every day | ORAL | 3 refills | Status: DC
Start: 2016-05-14 — End: 2016-06-11

## 2016-06-08 NOTE — Progress Notes (Signed)
HPI: FU AFib. She was evaluated in 03/2009 for chest pain. ETT-echo was done and was normal at submaximal exercise. Patient had episode of atrial fib in [place] in Oct 2012; treated with cardizem and converted spontaneously. TSH 0.901. Echo in Oct 2012 showed normal LV function. There was grade 1 diastolic dysfunction. There was mild left atrial enlargement and mild right atrial enlargement. There was mild aortic insufficiency. There was mild tricuspid regurgitation. Had [facility] Nov 2012 that showed EF 69 and normal perfusion. Patient had monitor for recurrent palpitations. This showed sinus with pacs. Since I last saw her, the patient denies any dyspnea on exertion, orthopnea, PND, pedal edema, palpitations, syncope or chest pain. She has fallen twice recently tripping over her garden hose and her shoes. She did hit her head and has ecchymosis around her left eye. She is not having headaches.    Current Outpatient Prescriptions  Medication Sig Dispense Refill  . bisoprolol (ZEBETA) 5 MG tablet Take 0.5 tablets (2.5 mg total) by mouth daily. 45 tablet 3  . chlorthalidone (HYGROTON) 25 MG tablet Take 1 tablet (25 mg total) by mouth daily. 90 tablet 3  . dabigatran (PRADAXA) 150 MG CAPS capsule Take 1 capsule (150 mg total) by mouth every 12 (twelve) hours. 180 capsule 3  . diltiazem (CARDIZEM CD) 180 MG 24 hr capsule Take 1 capsule (180 mg total) by mouth daily. 90 capsule 3  . Doxylamine Succinate, Sleep, (UNISOM PO) Take by mouth at bedtime as needed.    . furosemide (LASIX) 20 MG tablet Take 1 tablet (20 mg total) by mouth daily as needed. 30 tablet 3  . omeprazole (PRILOSEC) 20 MG capsule Take 1 capsule (20 mg total) by mouth daily. 90 capsule 3  . potassium chloride (K-DUR) 10 MEQ tablet Take 2 tablets (20 mEq total) by mouth 2 (two) times daily. 120 tablet 0  . ranitidine (ZANTAC) 150 MG tablet Take 1 tablet (150 mg total) by mouth 2 (two) times daily. 60 tablet 5   No current  facility-administered medications for this visit.      Past Medical History:  Diagnosis Date  . Allergic rhinitis    takes Benadryl at bedtime  . Arthritis   . Atrial fibrillation ([place]) 06-10-2011   takes Diltiazem and Pradaxa daily  . Cancer of skin of leg   . Collagenous colitis    recurrent, takes Budesonide daily as needed   . Colon polyps 01/14/2010   Hyperplastic  . Cough    states every day of her life and every chest xray is always clear  . GERD (gastroesophageal reflux disease)    takes Omeprazole daily  . History of poliomyelitis    Polio  age 60- no significant neuromuscular deficit  . Hyperlipidemia    was on Pravastatin but joint pain;has been off for 2-20months ;takes CoQ10  . Hypertension    takes Bisoprolol and Chlorthalidone daily  . Potassium deficiency    takes KDUR daily    Past Surgical History:  Procedure Laterality Date  . APPENDECTOMY     at age 39  . BREAST BIOPSY  1947 & 10/07   Benign  . cataracts removed    . COLONOSCOPY    . ENDOVENOUS ABLATION SAPHENOUS VEIN W/ LASER  03-03-2011 left greater saphenous vein   . ENDOVENOUS ABLATION SAPHENOUS VEIN W/ LASER  01-06-2011  right greater saphenous vein  . epidural infection     x 2   . SKIN CANCER EXCISION  removal of skin cancer on left leg  . TONSILLECTOMY     as a child  . TOTAL KNEE ARTHROPLASTY Right 09/18/2013   Procedure: TOTAL KNEE ARTHROPLASTY;  Surgeon: Garald Balding, MD;  Location: [facility];  Service: Orthopedics;  Laterality: Right;    Social History   Social History  . Marital status: Married    Spouse name: N/A  . Number of children: N/A  . Years of education: N/A   Occupational History  . Not on file.   Social History Main Topics  . Smoking status: Former Smoker    Years: 18.00    Types: Cigarettes  . Smokeless tobacco: Never Used     Comment: quit smoking 75yrs ago  . Alcohol use 0.0 oz/week     Comment: glass of wine daily  . Drug use: No  . Sexual activity:  Yes    Birth control/ protection: Surgical   Other Topics Concern  . 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 Alcohol abuse Father   . Heart disease Father   . Stroke Father   . Hypertension Father   . Colon cancer Maternal Grandmother   . Hypertension Sister   . Other Sister     varicose veins  . Stomach cancer Maternal Grandfather   . Heart disease Paternal Grandfather   . Peripheral vascular disease Mother   . Other Mother     varicose veins  . Cancer Son     colorectal    ROS: no fevers or chills, productive cough, hemoptysis, dysphasia, odynophagia, melena, hematochezia, dysuria, hematuria, rash, seizure activity, orthopnea, PND, pedal edema, claudication. Remaining systems are negative.  Physical Exam: Well-developed well-nourished in no acute distress.  Skin is warm and dry.  HEENT is normal.  Neck is supple.  Chest is clear to auscultation with normal expansion.  Cardiovascular exam is irregular and tachycardic Abdominal exam nontender or distended. No masses palpated. Extremities show no edema. neuro grossly intact  ECG-atrial fibrillation at a rate of 113.  A/P  1 hyperlipidemia-intolerant to statins. Continue diet.  2 hypertension-blood pressure controlled. Continue present medications.  3 paroxysmal atrial fibrillation-patient has developed recurrent atrial fibrillation. She is not symptomatic. Her rate is mildly increased. Continue bisoprolol. Increase Cardizem to 240 mg daily. Continue pradaxa. I will see her back in 4-6 weeks and if she remains in atrial fibrillation we will likely proceed with cardioversion. If she does not hold sinus rhythm I would favor rate control and anticoagulation as she is not  particularly symptomatic. I explained that if she has more frequent falls in the future we would potentially be forced to discontinue anticoagulation as the risk would outweigh the benefit. However her risk for stroke would be higher. I will plan to repeat her echocardiogram.  [name], MD

## 2016-06-11 ENCOUNTER — Encounter: Payer: Self-pay | Admitting: Cardiology

## 2016-06-11 ENCOUNTER — Ambulatory Visit (INDEPENDENT_AMBULATORY_CARE_PROVIDER_SITE_OTHER): Payer: Medicare Other | Admitting: Cardiology

## 2016-06-11 DIAGNOSIS — I10 Essential (primary) hypertension: Secondary | ICD-10-CM | POA: Diagnosis not present

## 2016-06-11 DIAGNOSIS — I480 Paroxysmal atrial fibrillation: Secondary | ICD-10-CM

## 2016-06-11 MED ORDER — DILTIAZEM HCL ER COATED BEADS 240 MG PO CP24: 240.0000 mg | ORAL_CAPSULE | Freq: Every day | ORAL | 3 refills | Status: DC

## 2016-06-11 MED ORDER — POTASSIUM CHLORIDE ER 10 MEQ PO TBCR: 20.0000 meq | EXTENDED_RELEASE_TABLET | Freq: Two times a day (BID) | ORAL | 3 refills | Status: DC

## 2016-06-11 MED ORDER — BISOPROLOL FUMARATE 5 MG PO TABS: 2.5000 mg | ORAL_TABLET | Freq: Every day | ORAL | 3 refills | Status: DC

## 2016-06-11 MED ORDER — FUROSEMIDE 20 MG PO TABS: 20.0000 mg | ORAL_TABLET | Freq: Every day | ORAL | 3 refills | Status: DC | PRN

## 2016-06-11 MED ORDER — DABIGATRAN ETEXILATE MESYLATE 150 MG PO CAPS: 150.0000 mg | ORAL_CAPSULE | Freq: Two times a day (BID) | ORAL | 3 refills | Status: DC

## 2016-06-11 NOTE — Patient Instructions (Addendum)
Medication Instructions:   INCREASE DILTIAZEM TO 240 MG ONCE DAILY  Testing/Procedures:  Your physician has requested that you have an echocardiogram. Echocardiography is a painless test that uses sound waves to create images of your heart. It provides your doctor with information about the size and shape of your heart and how well your heart's chambers and valves are working. This procedure takes approximately one hour. There are no restrictions for this procedure.    Follow-Up:  Your physician recommends that you schedule a follow-up appointment in: [place]

## 2016-06-30 ENCOUNTER — Ambulatory Visit (HOSPITAL_COMMUNITY): Payer: Medicare Other | Attending: Cardiology

## 2016-06-30 ENCOUNTER — Other Ambulatory Visit: Payer: Self-pay

## 2016-06-30 DIAGNOSIS — I480 Paroxysmal atrial fibrillation: Secondary | ICD-10-CM

## 2016-06-30 DIAGNOSIS — I10 Essential (primary) hypertension: Secondary | ICD-10-CM | POA: Diagnosis not present

## 2016-06-30 DIAGNOSIS — Z87891 Personal history of nicotine dependence: Secondary | ICD-10-CM | POA: Diagnosis not present

## 2016-06-30 DIAGNOSIS — I083 Combined rheumatic disorders of mitral, aortic and tricuspid valves: Secondary | ICD-10-CM | POA: Diagnosis not present

## 2016-06-30 DIAGNOSIS — E785 Hyperlipidemia, unspecified: Secondary | ICD-10-CM | POA: Diagnosis not present

## 2016-07-21 ENCOUNTER — Encounter: Payer: Self-pay | Admitting: Cardiology

## 2016-07-21 NOTE — Progress Notes (Signed)
HPI: FU AFib. She was evaluated in 03/2009 for chest pain. ETT-echo was done and was normal at submaximal exercise. Patient had episode of atrial fib in [place] in Oct 2012; treated with cardizem and converted spontaneously. TSH 0.901. Had [facility] Nov 2012 that showed EF 69 and normal perfusion. Patient had monitor for recurrent palpitations. This showed sinus with pacs. Echocardiogram November 2017 showed ejection fraction 50-55%, mild left ventricular hypertrophy, mild aortic and mitral regurgitation, mild left atrial enlargement and moderate tricuspid regurgitation. Noted to be in recurrent atrial fibrillation at last ov. Since I last saw her, the patient has dyspnea with more extreme activities but not with routine activities. It is relieved with rest. It is not associated with chest pain. There is no orthopnea, PND. There is no syncope or palpitations. There is no exertional chest pain.     Current Outpatient Prescriptions  Medication Sig Dispense Refill  . bisoprolol (ZEBETA) 5 MG tablet Take 0.5 tablets (2.5 mg total) by mouth daily. 45 tablet 3  . chlorthalidone (HYGROTON) 25 MG tablet Take 1 tablet (25 mg total) by mouth daily. 90 tablet 3  . dabigatran (PRADAXA) 150 MG CAPS capsule Take 1 capsule (150 mg total) by mouth every 12 (twelve) hours. 180 capsule 3  . diltiazem (CARDIZEM CD) 240 MG 24 hr capsule Take 1 capsule (240 mg total) by mouth daily. 90 capsule 3  . Doxylamine Succinate, Sleep, (UNISOM PO) Take by mouth at bedtime as needed.    . furosemide (LASIX) 20 MG tablet Take 1 tablet (20 mg total) by mouth daily as needed. 30 tablet 3  . omeprazole (PRILOSEC) 20 MG capsule Take 1 capsule (20 mg total) by mouth daily. 90 capsule 3  . potassium chloride (K-DUR) 10 MEQ tablet Take 2 tablets (20 mEq total) by mouth 2 (two) times daily. (Patient taking differently: Take 20 mEq by mouth 2 (two) times daily. TAKE 2 TABLETS IN THE MORNING AND 1 TABLET AT NIGHT.) 360 tablet 3  .  ranitidine (ZANTAC) 150 MG tablet Take 1 tablet (150 mg total) by mouth 2 (two) times daily. (Patient taking differently: Take 150 mg by mouth at bedtime. ) 60 tablet 5   No current facility-administered medications for this visit.      Past Medical History:  Diagnosis Date  . Allergic rhinitis    takes Benadryl at bedtime  . Arthritis   . Atrial fibrillation ([place]) 06-10-2011   takes Diltiazem and Pradaxa daily  . Cancer of skin of leg   . Collagenous colitis    recurrent, takes Budesonide daily as needed   . Colon polyps 01/14/2010   Hyperplastic  . Cough    states every day of her life and every chest xray is always clear  . GERD (gastroesophageal reflux disease)    takes Omeprazole daily  . History of poliomyelitis    Polio  age 70- no significant neuromuscular deficit  . Hyperlipidemia    was on Pravastatin but joint pain;has been off for 2-17months ;takes CoQ10  . Hypertension    takes Bisoprolol and Chlorthalidone daily  . Potassium deficiency    takes KDUR daily    Past Surgical History:  Procedure Laterality Date  . APPENDECTOMY     at age 43  . BREAST BIOPSY  1947 & 10/07   Benign  . cataracts removed    . COLONOSCOPY    . ENDOVENOUS ABLATION SAPHENOUS VEIN W/ LASER  03-03-2011 left greater saphenous vein   .  ENDOVENOUS ABLATION SAPHENOUS VEIN W/ LASER  01-06-2011  right greater saphenous vein  . epidural infection     x 2   . SKIN CANCER EXCISION     removal of skin cancer on left leg  . TONSILLECTOMY     as a child  . TOTAL KNEE ARTHROPLASTY Right 09/18/2013   Procedure: TOTAL KNEE ARTHROPLASTY;  Surgeon: Garald Balding, MD;  Location: [place];  Service: Orthopedics;  Laterality: Right;    Social History   Social History  . Marital status: Married    Spouse name: N/A  . Number of children: N/A  . Years of education: N/A   Occupational History  . Not on file.   Social History Main Topics  . Smoking status: Former Smoker    Years: 18.00     Types: Cigarettes  . Smokeless tobacco: Never Used     Comment: quit smoking 67yrs ago  . Alcohol use 0.0 oz/week     Comment: glass of wine daily  . Drug use: No  . Sexual activity: Yes    Birth control/ protection: Surgical   Other Topics Concern  . 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 Colon cancer Maternal Grandmother   . Stomach cancer Maternal Grandfather   . Heart disease Paternal Grandfather   . Cancer Son     colorectal  . Alcohol abuse Father   . Heart disease Father   . Stroke Father   . Hypertension Father   . Hypertension Sister   . Other Sister     varicose veins  . Peripheral vascular disease Mother   . Other Mother     varicose veins    ROS: no fevers or chills, productive cough, hemoptysis, dysphasia, odynophagia, melena, hematochezia, dysuria, hematuria, rash, seizure activity, orthopnea, PND, pedal edema, claudication. Remaining systems are negative.  Physical Exam: Well-developed well-nourished in no acute distress.  Skin is warm and dry.  HEENT is normal.  Neck is supple.  Chest is clear to auscultation with normal expansion.  Cardiovascular exam is irregular and tachycardic Abdominal exam nontender or distended. No masses palpated. Extremities show trace edema. neuro grossly intact  ECG-Atrial fibrillation at a rate of 119. No ST changes.  A/P  1 persistent atrial fibrillation-patient remains in atrial fibrillation today. Heart rate is elevated. Increase Cardizem CD to 360 mg daily. We discussed rhythm control versus rate control. She is not symptomatic. For now we have decided to continue with rate control. In approximately 1-2 weeks I will check a 48-hour Holter monitor to make sure that  her rate is adequately controlled. Continue pradaxa. If she becomes symptomatic we will proceed with cardioversion.  2 hypertension-blood pressure controlled.  3 hypokalemia-check K and renal function.  4 hyperlipidemia-intolerant to statins. Continue diet.  [name], MD

## 2016-07-26 ENCOUNTER — Encounter: Payer: Self-pay | Admitting: Cardiology

## 2016-07-26 ENCOUNTER — Ambulatory Visit (INDEPENDENT_AMBULATORY_CARE_PROVIDER_SITE_OTHER): Payer: Medicare Other | Admitting: Cardiology

## 2016-07-26 VITALS — BP 134/80 | HR 119 | Ht 65.0 in | Wt 170.0 lb

## 2016-07-26 DIAGNOSIS — I4819 Other persistent atrial fibrillation: Secondary | ICD-10-CM

## 2016-07-26 DIAGNOSIS — I481 Persistent atrial fibrillation: Secondary | ICD-10-CM | POA: Diagnosis not present

## 2016-07-26 DIAGNOSIS — I10 Essential (primary) hypertension: Secondary | ICD-10-CM | POA: Diagnosis not present

## 2016-07-26 DIAGNOSIS — E876 Hypokalemia: Secondary | ICD-10-CM | POA: Diagnosis not present

## 2016-07-26 DIAGNOSIS — I480 Paroxysmal atrial fibrillation: Secondary | ICD-10-CM | POA: Diagnosis not present

## 2016-07-26 LAB — BASIC METABOLIC PANEL
BUN: 22 mg/dL (ref 7–25)
CHLORIDE: 98 mmol/L (ref 98–110)
CO2: 32 mmol/L — AB (ref 20–31)
CREATININE: 0.8 mg/dL (ref 0.60–0.93)
Calcium: 9.1 mg/dL (ref 8.6–10.4)
Glucose, Bld: 80 mg/dL (ref 65–99)
POTASSIUM: 3.3 mmol/L — AB (ref 3.5–5.3)
SODIUM: 140 mmol/L (ref 135–146)

## 2016-07-26 MED ORDER — DILTIAZEM HCL ER COATED BEADS 360 MG PO CP24: 360.0000 mg | ORAL_CAPSULE | Freq: Every day | ORAL | 3 refills | Status: DC

## 2016-07-26 NOTE — Patient Instructions (Signed)
Medication Instructions:   INCREASE DILTIAZEM TO 360 MG ONCE DAILY  Labwork:  Your physician recommends that you HAVE LAB WORK TODAY  Testing/Procedures:  Your physician has recommended that you wear a 48 HOUR holter monitor. Holter monitors are medical devices that record the heart's electrical activity. Doctors most often use these monitors to diagnose arrhythmias. Arrhythmias are problems with the speed or rhythm of the heartbeat. The monitor is a small, portable device. You can wear one while you do your normal daily activities. This is usually used to diagnose what is causing palpitations/syncope (passing out).  SCHEDULE IN 2 WEEKS  Follow-Up:  Your physician recommends that you schedule a follow-up appointment in: AS SCHEDULED

## 2016-07-28 ENCOUNTER — Telehealth: Payer: Self-pay | Admitting: *Deleted

## 2016-07-28 DIAGNOSIS — E876 Hypokalemia: Secondary | ICD-10-CM

## 2016-07-28 MED ORDER — SPIRONOLACTONE 25 MG PO TABS: 25.0000 mg | ORAL_TABLET | Freq: Every day | ORAL | 12 refills | Status: DC

## 2016-07-28 MED ORDER — POTASSIUM CHLORIDE ER 10 MEQ PO TBCR: EXTENDED_RELEASE_TABLET | ORAL | 3 refills | Status: DC

## 2016-07-28 NOTE — Telephone Encounter (Addendum)
-----   Message from Lelon Perla, MD sent at 07/27/2016  7:25 AM EST ----- DC chlorthalidone; spironolactone 25 mg daily; change kdur to 20 meq daily only when she takes lasix 20 mg daily (on this as needed); bmet one week  [name]   Left message for pt to call

## 2016-07-28 NOTE — Telephone Encounter (Signed)
Patient voiced understanding of medication changes. New script sent to the pharmacy patient will have repeat labs at the [address] office.

## 2016-08-11 ENCOUNTER — Other Ambulatory Visit: Payer: Medicare Other | Admitting: *Deleted

## 2016-08-11 ENCOUNTER — Ambulatory Visit (INDEPENDENT_AMBULATORY_CARE_PROVIDER_SITE_OTHER): Payer: Medicare Other

## 2016-08-11 ENCOUNTER — Telehealth: Payer: Self-pay | Admitting: Internal Medicine

## 2016-08-11 DIAGNOSIS — E876 Hypokalemia: Secondary | ICD-10-CM

## 2016-08-11 DIAGNOSIS — I480 Paroxysmal atrial fibrillation: Secondary | ICD-10-CM | POA: Diagnosis not present

## 2016-08-11 LAB — BASIC METABOLIC PANEL
BUN: 20 mg/dL (ref 7–25)
CO2: 27 mmol/L (ref 20–31)
Calcium: 8.9 mg/dL (ref 8.6–10.4)
Chloride: 102 mmol/L (ref 98–110)
Creat: 0.95 mg/dL — ABNORMAL HIGH (ref 0.60–0.93)
GLUCOSE: 97 mg/dL (ref 65–99)
Potassium: 4.2 mmol/L (ref 3.5–5.3)
SODIUM: 138 mmol/L (ref 135–146)

## 2016-08-11 NOTE — Telephone Encounter (Signed)
Pt is wanting a

## 2016-08-12 ENCOUNTER — Telehealth: Payer: Self-pay | Admitting: Internal Medicine

## 2016-08-12 DIAGNOSIS — R600 Localized edema: Secondary | ICD-10-CM

## 2016-08-12 NOTE — Telephone Encounter (Signed)
Referral is going to [facility] for [facility]ema. Phone number [phone]

## 2016-08-12 NOTE — Telephone Encounter (Signed)
Please advise 

## 2016-08-12 NOTE — Telephone Encounter (Signed)
ordered

## 2016-08-12 NOTE — Telephone Encounter (Signed)
Referral for who/where?

## 2016-08-12 NOTE — Telephone Encounter (Signed)
States patient is requesting referral to be seen for edema.  States patient has previously been seen there but since it has been so long the office needs another referral.

## 2016-08-19 ENCOUNTER — Other Ambulatory Visit: Payer: Self-pay | Admitting: *Deleted

## 2016-08-19 DIAGNOSIS — I83893 Varicose veins of bilateral lower extremities with other complications: Secondary | ICD-10-CM

## 2016-08-20 ENCOUNTER — Telehealth: Payer: Self-pay | Admitting: *Deleted

## 2016-08-20 DIAGNOSIS — I10 Essential (primary) hypertension: Secondary | ICD-10-CM

## 2016-08-20 MED ORDER — BISOPROLOL FUMARATE 5 MG PO TABS
5.0000 mg | ORAL_TABLET | Freq: Every day | ORAL | 3 refills | Status: DC
Start: 2016-08-20 — End: 2016-09-14

## 2016-08-20 NOTE — Telephone Encounter (Signed)
-----   Message from Lelon Perla, MD sent at 08/19/2016  5:06 PM EST ----- Increase bisoprolol to 5 mg daily [name]

## 2016-08-20 NOTE — Telephone Encounter (Signed)
Spoke with pt, Aware of dr crenshaw's recommendations. New script sent to the pharmacy  

## 2016-08-27 ENCOUNTER — Encounter: Payer: Self-pay | Admitting: Nurse Practitioner

## 2016-08-27 ENCOUNTER — Ambulatory Visit (INDEPENDENT_AMBULATORY_CARE_PROVIDER_SITE_OTHER): Payer: Medicare Other | Admitting: Nurse Practitioner

## 2016-08-27 ENCOUNTER — Telehealth: Payer: Self-pay | Admitting: *Deleted

## 2016-08-27 VITALS — BP 132/62 | HR 72 | Ht 64.17 in | Wt 165.0 lb

## 2016-08-27 DIAGNOSIS — R6881 Early satiety: Secondary | ICD-10-CM

## 2016-08-27 DIAGNOSIS — K219 Gastro-esophageal reflux disease without esophagitis: Secondary | ICD-10-CM | POA: Diagnosis not present

## 2016-08-27 NOTE — Patient Instructions (Addendum)
Stop the Prilosec ( Omeprazole) 20 mg.  Take Zantac 150 mg, take 1 tab twice daily.  You have been scheduled for an endoscopy. Please follow written instructions given to you at your visit today. If you use inhalers (even only as needed), please bring them with you on the day of your procedure. Your physician has requested that you go to www.startemmi.com and enter the access code given to you at your visit today. This web site gives a general overview about your procedure. However, you should still follow specific instructions given to you by our office regarding your preparation for the procedure.  We will call you when we get the Pradaxa clearance instructions from Dr. Stanford Breed.

## 2016-08-27 NOTE — Progress Notes (Addendum)
HPI: Patient is a a 76 year old female previously followed by Dr. Olevia Perches, most recently by Dr. Silverio Decamp. Patient has a history of GERD and collagenous colitis. Last colonoscopy with polypectomy and random biopsies January 2017. Random biopsies negative. 2 adenomatous cecal polyps removed.  Patient comes in today for 2 issues. She has a chronic cough and wonders about its association with GERD. No heartburn or obvious reflux on treatment. Over the summer patient saw PCP for cough. Prilosec was continued and twice a day Zantac added. Patient does believe that her cough did improve it was difficult to tell because she has had some sinus/respiratory problems lately. Her cough is predominantly a morning time issue. Patient recently stopped her morning time Zantac. She is interested in eventually coming off Prilosec.  Second issue is that of early satiety since Thanksgiving. Cold foods seemed to be okay but hot foods or greasy foods cause her to feel "yucky" in upper abdomen. She is not really nauseated. Has a gnawing sensation in her upper abdomen. On anticoagulants, does not take NSAIDs. Maybe some mild weight loss. No BM changes. Collagenous colitis is in remission (off Entocort)  Husband has a recent recurrent of cancer (in chest).    Past Medical History:  Diagnosis Date  . Allergic rhinitis    takes Benadryl at bedtime  . Arthritis   . Atrial fibrillation ([place]) 06-10-2011   takes Diltiazem and Pradaxa daily  . Cancer of skin of leg   . Collagenous colitis    recurrent, takes Budesonide daily as needed   . Colon polyps 01/14/2010   Hyperplastic  . Cough    states every day of her life and every chest xray is always clear  . GERD (gastroesophageal reflux disease)    takes Omeprazole daily  . History of poliomyelitis    Polio  age 75- no significant neuromuscular deficit  . Hyperlipidemia    was on Pravastatin but joint pain;has been off for 2-44months ;takes CoQ10  . Hypertension      takes Bisoprolol and Chlorthalidone daily  . Potassium deficiency    takes KDUR daily    Patient's surgical history, family medical history, social history, medications and allergies were all reviewed in Epic    Physical Exam: BP 132/62   Pulse 72   Ht 5' 4.17" (1.63 m) Comment: w/o shoes  Wt 165 lb (74.8 kg)   BMI 28.17 kg/m   GENERAL: well developed white female in NAD PSYCH: :Pleasant, cooperative, normal affect HEENT: Normocephalic, conjunctiva pink, mucous membranes moist, neck supple without masses CARDIAC:  RRR, no murmur heard, trace BLE edema PULM: Normal respiratory effort, lungs CTA bilaterally, no wheezing ABDOMEN:  soft, nontender, nondistended, no obvious masses, no hepatomegaly,  normal bowel sounds SKIN:  turgor, no lesions seen Musculoskeletal:  Normal muscle tone, normal strength NEURO: Alert and oriented x 3, no focal neurologic deficits  ASSESSMENT and PLAN:  59. 76 year old female with six week history of early satiety, mainly to warm / greasy foods. Gnawing sensation in upper abdomen. No true nausea. No bowel changes or significant weight loss. On chronic PPI + H2 blocker.  -Patient wants to visit [place] to see Son but scared to travel until feels better. Early satiety / gnawing present for several weeks. It is reasonable to proceed with an EGD for further evaluation. She should be off anticoagulant for procedure. See #3.     2. Chronic cough, worse in am. Could be GERD related but also sinus drainage  which she admits to having. BID zantac added to PPI over the summer. Recently stopped am dose of Zantac. Patient interested in getting off Prilosec. Currently her GERD symptoms are controlled.  -Patient will trying stopping prilosec but I recommended she take the Zantac BID for now. If cough gets worse or develops heartburn then she should resume Prilosec. At some point, if doing well she may be able to reduce Zantac to once daily.   3. Collagenous colitis. In  remission off Entocort.  4. PAF / Chronic anticoagulation. Hold Pradaxa three day before procedure - will instruct when and how to resume after procedure. Patient aware that there is a low but real risk of cardiovascular event such as heart attack, stroke, embolism, thrombosis or ischemia/infarct of organs off Pradaxa. The patient consents to proceed. Will communicate by phone or EMR with patient's prescribing provider to confirm that holding Pradaxa is reasonable in this case.    Tye Savoy , NP 08/27/2016, 11:45 AM

## 2016-08-27 NOTE — Telephone Encounter (Signed)
Hold pradaxa 2 days prior to procedure and resume 2 days after [name]

## 2016-08-27 NOTE — Telephone Encounter (Signed)
08/27/2016   RE: Candida [place] DOB: 09/04/1940 MRN: [id number]   Dear Dr. [name],    We have scheduled the above patient for an endoscopic procedure. Our records show that she is on anticoagulation therapy.   Please advise as to how long the patient may come off her therapy of Pradaxa prior to the procedure, which is scheduled for 09-09-2016.  Please  route the Pradaxa clearance instructions to [place].   Sincerely,    Tye Savoy NP-C

## 2016-08-30 NOTE — Telephone Encounter (Signed)
Called the patient to advise her of Dr. Jacalyn Lefevre Pradaxa clearance instructions. She can hold the Pradaxa on 1-23,24,25,26 and resume on the 27th.  The patient verbalized understanding the instructions.

## 2016-09-02 ENCOUNTER — Encounter: Payer: Self-pay | Admitting: Gastroenterology

## 2016-09-06 NOTE — Progress Notes (Signed)
Reviewed and agree with documentation and assessment and plan. K. Veena Nandigam , MD   

## 2016-09-06 NOTE — Progress Notes (Signed)
HPI: FU AFib. Patient had episode of atrial fib in [place] in Oct 2012; treated with cardizem and converted spontaneously. TSH 0.901. Had [facility] Nov 2012 that showed EF 69 and normal perfusion. Echocardiogram November 2017 showed ejection fraction 50-55%, mild left ventricular hypertrophy, mild aortic and mitral regurgitation, mild left atrial enlargement and moderate tricuspid regurgitation. Noted to be in recurrent atrial fibrillation at previous ov. Holter monitor December 2017 showed atrial fibrillation with rate mildly elevated. Bisoprolol was increased. Since I last saw her, she denies dyspnea, chest pain, palpitations, syncope or bleeding. She has noticed worsening bilateral pedal edema that improves overnight. She has decreased energy at night.  Current Outpatient Prescriptions  Medication Sig Dispense Refill  . bisoprolol (ZEBETA) 5 MG tablet Take 1 tablet (5 mg total) by mouth daily. 90 tablet 3  . dabigatran (PRADAXA) 150 MG CAPS capsule Take 1 capsule (150 mg total) by mouth every 12 (twelve) hours. 180 capsule 3  . diltiazem (CARDIZEM CD) 360 MG 24 hr capsule Take 1 capsule (360 mg total) by mouth daily. 90 capsule 3  . Doxylamine Succinate, Sleep, (UNISOM PO) Take by mouth at bedtime as needed.    . furosemide (LASIX) 20 MG tablet Take 1 tablet (20 mg total) by mouth daily as needed. 30 tablet 3  . omeprazole (PRILOSEC) 20 MG capsule Take 1 capsule (20 mg total) by mouth daily. 90 capsule 3  . ranitidine (ZANTAC) 150 MG tablet Take 1 tablet (150 mg total) by mouth 2 (two) times daily. (Patient taking differently: Take 150 mg by mouth at bedtime. ) 60 tablet 5  . spironolactone (ALDACTONE) 25 MG tablet Take 1 tablet (25 mg total) by mouth daily. 30 tablet 12   Current Facility-Administered Medications  Medication Dose Route Frequency Provider Last Rate Last Dose  . 0.9 %  sodium chloride infusion  500 mL Intravenous Continuous Mauri Pole, MD         Past Medical  History:  Diagnosis Date  . Allergic rhinitis    takes Benadryl at bedtime  . Allergy   . Anemia   . Arthritis    osteoarthritis  . Atrial fibrillation ([place]) 06-10-2011   takes Diltiazem and Pradaxa daily  . Cancer of skin of leg   . Cataract    bilateral cataracts removed  . Clotting disorder ([place])    on PRADAXA - chronic a fib  . Collagenous colitis    recurrent, takes Budesonide daily as needed   . Colon polyps 01/14/2010   Hyperplastic  . Cough    states every day of her life and every chest xray is always clear  . GERD (gastroesophageal reflux disease)    takes Omeprazole daily  . History of poliomyelitis    Polio  age 62- no significant neuromuscular deficit  . Hyperlipidemia    was on Pravastatin but joint pain;has been off for 2-3months ;takes CoQ10  . Hypertension    takes Bisoprolol and Chlorthalidone daily  . Potassium deficiency    takes KDUR daily    Past Surgical History:  Procedure Laterality Date  . APPENDECTOMY     at age 15  . BREAST BIOPSY  1947 & 10/07   Benign  . cataracts removed    . COLONOSCOPY    . ENDOVENOUS ABLATION SAPHENOUS VEIN W/ LASER  03-03-2011 left greater saphenous vein   . ENDOVENOUS ABLATION SAPHENOUS VEIN W/ LASER  01-06-2011  right greater saphenous vein  . epidural infection  x 2   . SKIN CANCER EXCISION     removal of skin cancer on left leg  . TONSILLECTOMY     as a child  . TOTAL KNEE ARTHROPLASTY Right 09/18/2013   Procedure: TOTAL KNEE ARTHROPLASTY;  Surgeon: Garald Balding, MD;  Location: [place];  Service: Orthopedics;  Laterality: Right;    Social History   Social History  . Marital status: Married    Spouse name: N/A  . Number of children: N/A  . Years of education: N/A   Occupational History  . Not on file.   Social History Main Topics  . Smoking status: Former Smoker    Years: 18.00    Types: Cigarettes    Quit date: 08/17/1987  . Smokeless tobacco: Never Used     Comment: quit smoking 63yrs ago    . Alcohol use 4.2 oz/week    7 Glasses of wine per week     Comment: glass of wine daily  . Drug use: No  . Sexual activity: Yes    Birth control/ protection: Surgical   Other Topics Concern  . 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 Stomach cancer Maternal Grandfather   . Heart disease Paternal Grandfather   . Colon cancer Son   . Rectal cancer Son 61  . Colon cancer Paternal Grandmother   . Alcohol abuse Father   . Heart disease Father   . Stroke Father   . Hypertension Father   . Hypertension Sister   . Other Sister     varicose veins  . Peripheral vascular disease Mother   . Other Mother     varicose veins  . Esophageal cancer Neg Hx   . Pancreatic cancer Neg Hx     ROS: no fevers or chills, productive cough, hemoptysis, dysphasia, odynophagia, melena, hematochezia, dysuria, hematuria, rash, seizure activity, orthopnea, PND, pedal edema, claudication. Remaining systems are negative.  Physical Exam: Well-developed well-nourished in no acute distress.  Skin is warm and dry.  HEENT is normal.  Neck is supple.  Chest is clear to auscultation with normal expansion.  Cardiovascular exam is irregular Abdominal exam nontender or distended. No masses palpated. Extremities show no edema. neuro grossly intact  A/P -Atrial fibrillation at a rate of 82. No ST changes.  1 Persistent atrial fibrillation-patient remains in atrial fibrillation. Continue Cardizem and bisoprolol for rate control. Continue pradaxa. She is having increased pedal edema at times. She also notes decreased energy at night. I wonder if the atrial fibrillation is contributing. We will arrange a cardioversion to see if her symptoms improve. She is  scheduled for a trip to [place] and will return mid March. I will arrange for the cardioversion following this. We discussed importance of compliance with pradaxa prior to the procedure.   2 hypertension-blood pressure controlled. Continue present medications.  3 hyperlipidemia-intolerant to statins. Continue diet.  [name], MD

## 2016-09-07 ENCOUNTER — Encounter: Payer: Self-pay | Admitting: Vascular Surgery

## 2016-09-09 ENCOUNTER — Ambulatory Visit (AMBULATORY_SURGERY_CENTER): Payer: Medicare Other | Admitting: Gastroenterology

## 2016-09-09 ENCOUNTER — Encounter: Payer: Self-pay | Admitting: Gastroenterology

## 2016-09-09 VITALS — BP 113/67 | HR 87 | Temp 98.0°F | Resp 16 | Ht 64.0 in | Wt 165.0 lb

## 2016-09-09 DIAGNOSIS — K2970 Gastritis, unspecified, without bleeding: Secondary | ICD-10-CM

## 2016-09-09 DIAGNOSIS — R6881 Early satiety: Secondary | ICD-10-CM | POA: Diagnosis not present

## 2016-09-09 DIAGNOSIS — K317 Polyp of stomach and duodenum: Secondary | ICD-10-CM

## 2016-09-09 DIAGNOSIS — K922 Gastrointestinal hemorrhage, unspecified: Secondary | ICD-10-CM

## 2016-09-09 DIAGNOSIS — K2990 Gastroduodenitis, unspecified, without bleeding: Secondary | ICD-10-CM

## 2016-09-09 MED ORDER — SODIUM CHLORIDE 0.9 % IV SOLN: 500.0000 mL | INTRAVENOUS | Status: DC

## 2016-09-09 NOTE — Op Note (Signed)
[place] Patient Name: [name] [name] Procedure Date: 09/09/2016 10:24 AM MRN: [id number] Endoscopist: Mauri Pole , MD Age: 76 Referring MD:  Date of Birth: [birth year] Gender: Female Account #: [id number] Procedure:                Upper GI endoscopy Indications:              New-onset upper abdominal symptoms in patient older                            than 50 years Medicines:                Monitored Anesthesia Care Procedure:                Pre-Anesthesia Assessment:                           - Prior to the procedure, a History and Physical                            was performed, and patient medications and                            allergies were reviewed. The patient's tolerance of                            previous anesthesia was also reviewed. The risks                            and benefits of the procedure and the sedation                            options and risks were discussed with the patient.                            All questions were answered, and informed consent                            was obtained. Prior Anticoagulants: The patient                            last took Pradaxa (dabigatran) 5 days prior to the                            procedure. ASA Grade Assessment: III - A patient                            with severe systemic disease. After reviewing the                            risks and benefits, the patient was deemed in                            satisfactory condition to undergo the procedure.  After obtaining informed consent, the endoscope was                            passed under direct vision. Throughout the                            procedure, the patient's blood pressure, pulse, and                            oxygen saturations were monitored continuously. The                            Model GIF-HQ190 [id number]) scope was introduced                            through the mouth, and advanced  to the second part                            of duodenum. The upper GI endoscopy was                            accomplished without difficulty. The patient                            tolerated the procedure well. Scope In: Scope Out: Findings:                 The esophagus was normal.                           Multiple 5 to 15 mm sessile polyps with no bleeding                            and no stigmata of recent bleeding were found in                            the gastric fundus.                           A single 14 mm pedunculated polyp with bleeding and                            stigmata of recent bleeding was found on the lesser                            curvature of the stomach. The polyp was removed                            with a hot snare. Resection and retrieval were                            complete. To prevent bleeding after the  polypectomy, one hemostatic clip was successfully                            placed (MR conditional). There was no bleeding at                            the end of the procedure.                           Biopsies were taken with a cold forceps in the                            gastric body and in the gastric antrum for                            Helicobacter pylori testing using CLOtest. To stop                            active bleeding from one of the biopsy sites, one                            hemostatic clip was successfully placed (MR                            conditional). There was no bleeding at the end of                            the procedure.                           The examined duodenum was normal. Complications:            No immediate complications. Estimated Blood Loss:     Estimated blood loss was minimal. Impression:               - Normal esophagus.                           - Multiple gastric polyps.                           - A single gastric polyp. Resected and retrieved.                             Clip (MR conditional) was placed.                           - Normal examined duodenum.                           - Biopsies were taken with a cold forceps for                            Helicobacter pylori testing using CLOtest.                           -  One hemostatic clip was successfully placed (MR                            conditional). Recommendation:           - Patient has a contact number available for                            emergencies. The signs and symptoms of potential                            delayed complications were discussed with the                            patient. Return to normal activities tomorrow.                            Written discharge instructions were provided to the                            patient.                           - Resume previous diet.                           - Continue present medications.                           - Await pathology results.                           - No aspirin, ibuprofen, naproxen, or other                            non-steroidal anti-inflammatory drugs for 2 weeks.                           - Resume Pradaxa (dabigatran) at prior dose                            tomorrow. Refer to managing physician for further                            adjustment of therapy. Mauri Pole, MD 09/09/2016 11:02:17 AM This report has been signed electronically.

## 2016-09-09 NOTE — Patient Instructions (Signed)
Discharge instructions given. Clip card given in recovery room. No aspirin,ibuprofen,naproxen,or other non-steroidal anti-inflammatory drugs for 2 weeks. Resume previous medications. Resume Pradaxa at prior dose tomorrow. YOU HAD AN ENDOSCOPIC PROCEDURE TODAY AT [facility]:   Refer to the procedure report that was given to you for any specific questions about what was found during the examination.  If the procedure report does not answer your questions, please call your gastroenterologist to clarify.  If you requested that your care partner not be given the details of your procedure findings, then the procedure report has been included in a sealed envelope for you to review at your convenience later.  YOU SHOULD EXPECT: Some feelings of bloating in the abdomen. Passage of more gas than usual.  Walking can help get rid of the air that was put into your GI tract during the procedure and reduce the bloating. If you had a lower endoscopy (such as a colonoscopy or flexible sigmoidoscopy) you may notice spotting of blood in your stool or on the toilet paper. If you underwent a bowel prep for your procedure, you may not have a normal bowel movement for a few days.  Please Note:  You might notice some irritation and congestion in your nose or some drainage.  This is from the oxygen used during your procedure.  There is no need for concern and it should clear up in a day or so.  SYMPTOMS TO REPORT IMMEDIATELY:    Following upper endoscopy (EGD)  Vomiting of blood or coffee ground material  New chest pain or pain under the shoulder blades  Painful or persistently difficult swallowing  New shortness of breath  Fever of 100F or higher  Black, tarry-looking stools  For urgent or emergent issues, a gastroenterologist can be reached at any hour by calling [phone].   DIET:  We do recommend a small meal at first, but then you may proceed to your regular diet.  Drink plenty of  fluids but you should avoid alcoholic beverages for 24 hours.  ACTIVITY:  You should plan to take it easy for the rest of today and you should NOT DRIVE or use heavy machinery until tomorrow (because of the sedation medicines used during the test).    FOLLOW UP: Our staff will call the number listed on your records the next business day following your procedure to check on you and address any questions or concerns that you may have regarding the information given to you following your procedure. If we do not reach you, we will leave a message.  However, if you are feeling well and you are not experiencing any problems, there is no need to return our call.  We will assume that you have returned to your regular daily activities without incident.  If any biopsies were taken you will be contacted by phone or by letter within the next 1-3 weeks.  Please call us at [phone] if you have not heard about the biopsies in 3 weeks.    SIGNATURES/CONFIDENTIALITY: You and/or your care partner have signed paperwork which will be entered into your electronic medical record.  These signatures attest to the fact that that the information above on your After Visit Summary has been reviewed and is understood.  Full responsibility of the confidentiality of this discharge information lies with you and/or your care-partner.

## 2016-09-09 NOTE — Progress Notes (Signed)
Report to PACU, RN, vss, BBS= Clear.  

## 2016-09-09 NOTE — Progress Notes (Signed)
Called to room to assist during endoscopic procedure.  Patient ID and intended procedure confirmed with present staff. Received instructions for my participation in the procedure from the performing physician.  

## 2016-09-10 ENCOUNTER — Telehealth: Payer: Self-pay | Admitting: *Deleted

## 2016-09-10 ENCOUNTER — Ambulatory Visit (HOSPITAL_COMMUNITY)
Admission: RE | Admit: 2016-09-10 | Discharge: 2016-09-10 | Disposition: A | Discharge status: Home / Self Care | Payer: Medicare Other | Source: Ambulatory Visit | Attending: Vascular Surgery | Admitting: Vascular Surgery

## 2016-09-10 ENCOUNTER — Other Ambulatory Visit (INDEPENDENT_AMBULATORY_CARE_PROVIDER_SITE_OTHER): Payer: Medicare Other

## 2016-09-10 DIAGNOSIS — D509 Iron deficiency anemia, unspecified: Secondary | ICD-10-CM

## 2016-09-10 DIAGNOSIS — I83893 Varicose veins of bilateral lower extremities with other complications: Secondary | ICD-10-CM | POA: Insufficient documentation

## 2016-09-10 LAB — HELICOBACTER PYLORI SCREEN-BIOPSY: UREASE: NEGATIVE

## 2016-09-10 NOTE — Telephone Encounter (Signed)
  Follow up Call-  Call back number 09/09/2016 09/04/2015  Post procedure Call Back phone  # [phone] hm [phone]  Permission to leave phone message Yes Yes  Some recent data might be hidden     Patient questions:  Do you have a fever, pain , or abdominal swelling? No. Pain Score  0 *  Have you tolerated food without any problems? Yes.    Have you been able to return to your normal activities? Yes.    Do you have any questions about your discharge instructions: Diet   No. Medications  No. Follow up visit  No.  Do you have questions or concerns about your Care? No.  Actions: * If pain score is 4 or above: No action needed, pain <4.

## 2016-09-14 ENCOUNTER — Ambulatory Visit (INDEPENDENT_AMBULATORY_CARE_PROVIDER_SITE_OTHER): Payer: Medicare Other | Admitting: Vascular Surgery

## 2016-09-14 ENCOUNTER — Encounter: Payer: Self-pay | Admitting: *Deleted

## 2016-09-14 ENCOUNTER — Encounter: Payer: Self-pay | Admitting: Cardiology

## 2016-09-14 ENCOUNTER — Ambulatory Visit (INDEPENDENT_AMBULATORY_CARE_PROVIDER_SITE_OTHER): Payer: Medicare Other | Admitting: Cardiology

## 2016-09-14 ENCOUNTER — Encounter: Payer: Self-pay | Admitting: Vascular Surgery

## 2016-09-14 VITALS — BP 122/76 | HR 77 | Temp 97.0°F | Resp 18 | Ht 65.0 in | Wt 164.2 lb

## 2016-09-14 DIAGNOSIS — I480 Paroxysmal atrial fibrillation: Secondary | ICD-10-CM | POA: Diagnosis not present

## 2016-09-14 DIAGNOSIS — I10 Essential (primary) hypertension: Secondary | ICD-10-CM

## 2016-09-14 DIAGNOSIS — R600 Localized edema: Secondary | ICD-10-CM

## 2016-09-14 MED ORDER — BISOPROLOL FUMARATE 5 MG PO TABS: 5.0000 mg | ORAL_TABLET | Freq: Every day | ORAL | 3 refills | Status: DC

## 2016-09-14 MED ORDER — DABIGATRAN ETEXILATE MESYLATE 150 MG PO CAPS: 150.0000 mg | ORAL_CAPSULE | Freq: Two times a day (BID) | ORAL | 3 refills | Status: DC

## 2016-09-14 MED ORDER — SPIRONOLACTONE 25 MG PO TABS: 25.0000 mg | ORAL_TABLET | Freq: Every day | ORAL | 3 refills | Status: DC

## 2016-09-14 MED ORDER — FUROSEMIDE 20 MG PO TABS: 20.0000 mg | ORAL_TABLET | Freq: Every day | ORAL | 3 refills | Status: DC | PRN

## 2016-09-14 MED ORDER — DILTIAZEM HCL ER COATED BEADS 360 MG PO CP24: 360.0000 mg | ORAL_CAPSULE | Freq: Every day | ORAL | 3 refills | Status: DC

## 2016-09-14 NOTE — Patient Instructions (Signed)
Medication Instructions:   NO CHANGE  Labwork:  Your physician recommends that you return for lab work PRIOR TO CARDIOVERSION  Testing/Procedures:  Your physician has recommended that you have a Cardioversion (DCCV). Electrical Cardioversion uses a jolt of electricity to your heart either through paddles or wired patches attached to your chest. This is a controlled, usually prescheduled, procedure. Defibrillation is done under light anesthesia in the hospital, and you usually go home the day of the procedure. This is done to get your heart back into a normal rhythm. You are not awake for the procedure. Please see the instruction sheet given to you today.    Follow-Up:  Your physician recommends that you schedule a follow-up appointment in: 4-6 [place]

## 2016-09-14 NOTE — Progress Notes (Signed)
Vascular and Vein Specialist of [facility]  Patient name: [name] [name] MRN: [id number] DOB: [birth year] Sex: female  REASON FOR CONSULT: Evaluation of lower extremity edema  HPI: [name] [name] is a 76 y.o. female, who is seen today for further evaluation. She is known to me from prior staged bilateral great saphenous vein ablation in 2012. She has had the progressive changes of bilateral lower from the edema which is mostly from her knees down to her ankles and onto her feet to some degree. She has no history of DVT. She reports that she has minimal swelling when she first arises in the morning and has progressive swelling throughout the day. She does have a cardiac arrhythmia and is planned for potential ablation or cardioversion for chronic A. fib. She has an upcoming trip to [place].  Past Medical History:  Diagnosis Date  . Allergic rhinitis    takes Benadryl at bedtime  . Allergy   . Anemia   . Arthritis    osteoarthritis  . Atrial fibrillation ([place]) 06-10-2011   takes Diltiazem and Pradaxa daily  . Cancer of skin of leg   . Cataract    bilateral cataracts removed  . Clotting disorder ([place])    on PRADAXA - chronic a fib  . Collagenous colitis    recurrent, takes Budesonide daily as needed   . Colon polyps 01/14/2010   Hyperplastic  . Cough    states every day of her life and every chest xray is always clear  . GERD (gastroesophageal reflux disease)    takes Omeprazole daily  . History of poliomyelitis    Polio  age 86- no significant neuromuscular deficit  . Hyperlipidemia    was on Pravastatin but joint pain;has been off for 2-55months ;takes CoQ10  . Hypertension    takes Bisoprolol and Chlorthalidone daily  . Potassium deficiency    takes KDUR daily    Family History  Problem Relation Age of Onset  . Stomach cancer Maternal Grandfather   . Heart disease Paternal Grandfather   . Colon cancer Son   . Rectal cancer Son 18  . Colon  cancer Paternal Grandmother   . Alcohol abuse Father   . Heart disease Father   . Stroke Father   . Hypertension Father   . Hypertension Sister   . Other Sister     varicose veins  . Peripheral vascular disease Mother   . Other Mother     varicose veins  . Esophageal cancer Neg Hx   . Pancreatic cancer Neg Hx     SOCIAL HISTORY: Social History   Social History  . Marital status: Married    Spouse name: N/A  . Number of children: N/A  . Years of education: N/A   Occupational History  . Not on file.   Social History Main Topics  . Smoking status: Former Smoker    Years: 18.00    Types: Cigarettes    Quit date: 08/17/1987  . Smokeless tobacco: Never Used     Comment: quit smoking 61yrs ago  . Alcohol use 4.2 oz/week    7 Glasses of wine per week     Comment: glass of wine daily  . Drug use: No  . Sexual activity: Yes    Birth control/ protection: Surgical   Other Topics Concern  . Not on file   Social History Narrative   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   Married  Former Smoker quit 1989    Alcohol use-yes -  glasses of wine/day      son works for [employer] - she flies to [place] to visit her grandchildren    Allergies  Allergen Reactions  . Augmentin [Amoxicillin-Pot Clavulanate] Diarrhea  . Levaquin [Levofloxacin In D5w]     Severe join pain, muscle pain, and tendonitis  . Polysporin [Bacitracin-Polymyxin B]     Blisters   . Pravastatin Other (See Comments)    Muscle pain and weakness  . Sulfonamide Derivatives Hives    Childhood reaction  . Erythromycin Swelling and Rash    Current Outpatient Prescriptions  Medication Sig Dispense Refill  . bisoprolol (ZEBETA) 5 MG tablet Take 1 tablet (5 mg total) by mouth daily. 90 tablet 3  . dabigatran (PRADAXA) 150 MG CAPS capsule Take 1 capsule (150 mg total) by mouth every 12 (twelve) hours. 180 capsule 3  . diltiazem (CARDIZEM CD) 360 MG 24 hr capsule Take 1 capsule (360 mg total) by mouth daily.  90 capsule 3  . Doxylamine Succinate, Sleep, (UNISOM PO) Take by mouth at bedtime as needed.    . furosemide (LASIX) 20 MG tablet Take 1 tablet (20 mg total) by mouth daily as needed. 90 tablet 3  . omeprazole (PRILOSEC) 20 MG capsule Take 1 capsule (20 mg total) by mouth daily. 90 capsule 3  . ranitidine (ZANTAC) 150 MG tablet Take 1 tablet (150 mg total) by mouth 2 (two) times daily. (Patient taking differently: Take 150 mg by mouth at bedtime. ) 60 tablet 5  . spironolactone (ALDACTONE) 25 MG tablet Take 1 tablet (25 mg total) by mouth daily. 90 tablet 3   Current Facility-Administered Medications  Medication Dose Route Frequency Provider Last Rate Last Dose  . 0.9 %  sodium chloride infusion  500 mL Intravenous Continuous Kavitha Nandigam V, MD        REVIEW OF SYSTEMS:  [X]  denotes positive finding, [ ]  denotes negative finding Cardiac  Comments:  Chest pain or chest pressure:    Shortness of breath upon exertion:    Short of breath when lying flat:    Irregular heart rhythm: x       Vascular    Pain in calf, thigh, or hip brought on by ambulation:    Pain in feet at night that wakes you up from your sleep:     Blood clot in your veins:    Leg swelling:  x       Pulmonary    Oxygen at home:    Productive cough:  x   Wheezing:         Neurologic    Sudden weakness in arms or legs:     Sudden numbness in arms or legs:     Sudden onset of difficulty speaking or slurred speech:    Temporary loss of vision in one eye:     Problems with dizziness:         Gastrointestinal    Blood in stool:     Vomited blood:         Genitourinary    Burning when urinating:     Blood in urine:        Psychiatric    Major depression:         Hematologic    Bleeding problems:    Problems with blood clotting too easily:        Skin    Rashes or ulcers:  Constitutional    Fever or chills:      PHYSICAL EXAM: Vitals:   09/14/16 1443  BP: 122/76  Pulse: 77  Resp: 18    Temp: 97 F (36.1 C)  TempSrc: Oral  SpO2: 98%  Weight: 164 lb 3.2 oz (74.5 kg)  Height: 5\' 5"  (1.651 m)    GENERAL: The patient is a well-nourished female, in no acute distress. The vital signs are documented above. CARDIOVASCULAR: 2+ radial and 2+ dorsalis pedis pulses bilaterally PULMONARY: There is good air exchange  ABDOMEN: Soft and non-tender  MUSCULOSKELETAL: There are no major deformities or cyanosis. NEUROLOGIC: No focal weakness or paresthesias are detected. SKIN: She does have scattered varicose veins over the medial aspect of her thigh and calves bilaterally. Also telangiectasia pronounced in her left medial proximal calf PSYCHIATRIC: The patient has a normal affect.  DATA:  Noninvasive studies reveal reflux limited to her popliteal vein and the deep system. She has no evidence of superficial reflux on the left. On the right she has a probable superficial great saphenous vein branches running throughout her medial thigh and proximal calf with some reflux  MEDICAL ISSUES: I discussed significance of this with patient. I feel that her main cause for swelling is most likely volume overload rather than any correctable cause. She does popliteal vein reflux which explain only treatment would be elevation compression. She does wear knee-high compression is and I have encouraged her to continue this. I would not recommend any attempt at ablation of her superficial branch of her saphenous vein on the right since I feel this would give her minimal improvement in swelling particularly in light of equal bilateral leg swelling. She is comfortable with this discussion and will see [place] again on as-needed basis   Rosetta Posner, MD [facility] Vascular and Vein Specialists of [facility] Tel [phone] Pager [phone]

## 2016-09-20 ENCOUNTER — Encounter: Payer: Self-pay | Admitting: Gastroenterology

## 2016-10-06 ENCOUNTER — Encounter: Payer: Medicare Other | Admitting: Vascular Surgery

## 2016-10-06 ENCOUNTER — Encounter (HOSPITAL_COMMUNITY): Payer: Medicare Other

## 2016-11-01 ENCOUNTER — Ambulatory Visit (INDEPENDENT_AMBULATORY_CARE_PROVIDER_SITE_OTHER): Payer: Medicare Other

## 2016-11-01 ENCOUNTER — Encounter (INDEPENDENT_AMBULATORY_CARE_PROVIDER_SITE_OTHER): Payer: Self-pay | Admitting: Orthopaedic Surgery

## 2016-11-01 ENCOUNTER — Ambulatory Visit (INDEPENDENT_AMBULATORY_CARE_PROVIDER_SITE_OTHER): Payer: Medicare Other | Admitting: Orthopaedic Surgery

## 2016-11-01 ENCOUNTER — Ambulatory Visit (INDEPENDENT_AMBULATORY_CARE_PROVIDER_SITE_OTHER): Payer: Self-pay

## 2016-11-01 VITALS — Ht 65.0 in | Wt 160.0 lb

## 2016-11-01 DIAGNOSIS — M79672 Pain in left foot: Secondary | ICD-10-CM | POA: Diagnosis not present

## 2016-11-01 DIAGNOSIS — M545 Low back pain: Secondary | ICD-10-CM | POA: Diagnosis not present

## 2016-11-01 DIAGNOSIS — M25572 Pain in left ankle and joints of left foot: Secondary | ICD-10-CM

## 2016-11-01 DIAGNOSIS — M160 Bilateral primary osteoarthritis of hip: Secondary | ICD-10-CM

## 2016-11-01 DIAGNOSIS — G8929 Other chronic pain: Secondary | ICD-10-CM

## 2016-11-01 LAB — CBC
HEMATOCRIT: 39 % (ref 34.0–46.6)
Hemoglobin: 13.5 g/dL (ref 11.1–15.9)
MCH: 33.1 pg — ABNORMAL HIGH (ref 26.6–33.0)
MCHC: 34.6 g/dL (ref 31.5–35.7)
MCV: 96 fL (ref 79–97)
Platelets: 274 10*3/uL (ref 150–379)
RBC: 4.08 x10E6/uL (ref 3.77–5.28)
RDW: 12.9 % (ref 12.3–15.4)
WBC: 8.4 10*3/uL (ref 3.4–10.8)

## 2016-11-01 LAB — BASIC METABOLIC PANEL
BUN/Creatinine Ratio: 26 (ref 12–28)
BUN: 24 mg/dL (ref 8–27)
CALCIUM: 9.3 mg/dL (ref 8.7–10.3)
CO2: 25 mmol/L (ref 18–29)
CREATININE: 0.93 mg/dL (ref 0.57–1.00)
Chloride: 98 mmol/L (ref 96–106)
GFR, EST AFRICAN AMERICAN: 69 mL/min/{1.73_m2} (ref >59)
GFR, EST NON AFRICAN AMERICAN: 60 mL/min/{1.73_m2} (ref >59)
Glucose: 97 mg/dL (ref 65–99)
POTASSIUM: 4.1 mmol/L (ref 3.5–5.2)
Sodium: 133 mmol/L — ABNORMAL LOW (ref 134–144)

## 2016-11-01 NOTE — Progress Notes (Signed)
Office Visit Note   Patient: [name] [name]           Date of Birth: [birth year]           MRN: [id number] Visit Date: 11/01/2016              Requested by: Binnie Rail, MD [place], [place] [address] PCP: Binnie Rail, MD   Assessment & Plan: Visit Diagnoses: Mild Left foot pronation. Chronic back and pelvis pain  Plan: Full arch supports both shoes. MRI scan pelvis . [name] [name] has had prior MRI scans of her lumbar spine demonstrating degenerative changes. Him to the pelvis demonstrate areas of what appear to be cyst formation in the femoral heads and subchondral region of the acetabulum I'd like to obtain an MRI scan to further defined any pathology.  Follow-Up Instructions: No Follow-up on file.   Orders:  No orders of the defined types were placed in this encounter.  No orders of the defined types were placed in this encounter.     Procedures: No procedures performed   Clinical Data: No additional findings.   Subjective: No chief complaint on file.   [name] [name] is a 76 y.o. female, who is seen today for left ankle pain, prior severe left sprained ankle 5 years ago. Dr. Donnetta Hutching reported patient prior staged bilateral great saphenous vein ablation in 2012. She has had the progressive changes of bilateral lower from the edema which is mostly from her knees down to her ankles and onto her feet to some degree. She has no history of DVT. She reports that she has minimal swelling when she first arises in the morning and has progressive swelling throughout the day. She does have a cardiac arrhythmia and is planned for potential ablation or cardioversion for chronic A. Fib on 11/05/16.  She has bilateral hip pain that she takes her expired Tramadol for a R TKA in 2015. That knee is in great shape, no pain.  Review of Systems  [name] [name] has been experiencing a sensation of her "ankle turning in" associated with lateral ankle pain. She has been wearing good comfortable shoes.  Denies injury or trauma.  She also has been experiencing low back pain and bilateral lateral hip discomfort. She has worn a back support that seems to "helps". She has had prior MRI scans demonstrating some arthritic change in her back:  Physical Exam  Ortho Exam left foot with some pronation on weightbearing. Minimal lateral ankle pain along the anterior talofibular ligament. No obvious instability No. Loss of subtalar motion. No Achilles pain. Chronic bilateral ankle swelling. Good pulses. Neuro intact. No pain along the posterior tibial tendon or peroneal tendons. Normal inversion of os calcis with weightbearing  No pain with range of motion of either hip in internal or external rotation. Straight leg raise negative. Minimal percussible tenderness of lumbar spine.  Specialty Comments:  No specialty comments available.  Imaging: No results found.   PMFS History: Patient Active Problem List   Diagnosis Date Noted  . Localized edema 04/13/2016  . Fatigue 05/29/2015  . Osteoarthritis of right knee 09/21/2013  . S/P total knee replacement using cement 09/18/2013  . Anisocoria 07/26/2013  . Knee osteoarthritis 06/09/2012  . Varicose veins of lower extremities with other complications 91/63/8466  . Paroxysmal Atrial Fibrillation 06/29/2011  . Hypokalemia 06/29/2011  . COUGH, CHRONIC 03/09/2010  . HIP PAIN, BILATERAL 07/17/2009  . CHEST PAIN 03/31/2009  . ALLERGIC RHINITIS 04/23/2008  .  GERD 11/01/2007  . Essential hypertension 09/20/2007  . SKIN CANCER, LEG 07/03/2007  . HYPERLIPIDEMIA 07/03/2007  . COLLAGENOUS COLITIS 07/03/2007  . POLIOMYELITIS, HX OF 07/03/2007   Past Medical History:  Diagnosis Date  . Allergic rhinitis    takes Benadryl at bedtime  . Allergy   . Anemia   . Arthritis    osteoarthritis  . Atrial fibrillation ([place]) 06-10-2011   takes Diltiazem and Pradaxa daily  . Cancer of skin of leg   . Cataract    bilateral cataracts removed  . Clotting disorder  ([place])    on PRADAXA - chronic a fib  . Collagenous colitis    recurrent, takes Budesonide daily as needed   . Colon polyps 01/14/2010   Hyperplastic  . Cough    states every day of her life and every chest xray is always clear  . GERD (gastroesophageal reflux disease)    takes Omeprazole daily  . History of poliomyelitis    Polio  age 1- no significant neuromuscular deficit  . Hyperlipidemia    was on Pravastatin but joint pain;has been off for 2-58months ;takes CoQ10  . Hypertension    takes Bisoprolol and Chlorthalidone daily  . Potassium deficiency    takes KDUR daily    Family History  Problem Relation Age of Onset  . Stomach cancer Maternal Grandfather   . Heart disease Paternal Grandfather   . Colon cancer Son   . Rectal cancer Son 7  . Colon cancer Paternal Grandmother   . Alcohol abuse Father   . Heart disease Father   . Stroke Father   . Hypertension Father   . Hypertension Sister   . Other Sister     varicose veins  . Peripheral vascular disease Mother   . Other Mother     varicose veins  . Esophageal cancer Neg Hx   . Pancreatic cancer Neg Hx     Past Surgical History:  Procedure Laterality Date  . APPENDECTOMY     at age 24  . BREAST BIOPSY  1947 & 10/07   Benign  . cataracts removed    . COLONOSCOPY    . ENDOVENOUS ABLATION SAPHENOUS VEIN W/ LASER  03-03-2011 left greater saphenous vein   . ENDOVENOUS ABLATION SAPHENOUS VEIN W/ LASER  01-06-2011  right greater saphenous vein  . epidural infection     x 2   . SKIN CANCER EXCISION     removal of skin cancer on left leg  . TONSILLECTOMY     as a child  . TOTAL KNEE ARTHROPLASTY Right 09/18/2013   Procedure: TOTAL KNEE ARTHROPLASTY;  Surgeon: Garald Balding, MD;  Location: [facility];  Service: Orthopedics;  Laterality: Right;   Social History   Occupational History  . Not on file.   Social History Main Topics  . Smoking status: Former Smoker    Years: 18.00    Types: Cigarettes    Quit date:  08/17/1987  . Smokeless tobacco: Never Used     Comment: quit smoking 43yrs ago  . Alcohol use 4.2 oz/week    7 Glasses of wine per week     Comment: glass of wine daily  . Drug use: No  . Sexual activity: Yes    Birth control/ protection: Surgical

## 2016-11-02 ENCOUNTER — Telehealth: Payer: Self-pay | Admitting: Cardiology

## 2016-11-02 NOTE — Telephone Encounter (Signed)
Returned call to patient.She stated she is scheduled for a cardioversion 11/05/16.Stated she is anxious,she has been reading about cardioversions.She wanted to know her risk.Patient reassured.Advised to take Pradaxa as prescribed.Advised not to miss any doses.Stated she has been taking and has not missed any doses.Advised to follow directions she was given and keep follow up appointment with Dr.Crenshaw 12/06/16 9:00 am.

## 2016-11-02 NOTE — Telephone Encounter (Signed)
New message      Pt has a cardioversion scheduled on Friday.  She has questions.  Please call

## 2016-11-05 ENCOUNTER — Encounter (HOSPITAL_COMMUNITY)
Admission: RE | Disposition: A | Discharge status: Home / Self Care | Payer: Self-pay | Source: Ambulatory Visit | Attending: Cardiology

## 2016-11-05 ENCOUNTER — Ambulatory Visit (HOSPITAL_COMMUNITY)
Admission: RE | Admit: 2016-11-05 | Discharge: 2016-11-05 | Disposition: A | Discharge status: Home / Self Care | Payer: Medicare Other | Source: Ambulatory Visit | Attending: Cardiology | Admitting: Cardiology

## 2016-11-05 ENCOUNTER — Ambulatory Visit (HOSPITAL_COMMUNITY): Payer: Medicare Other | Admitting: Anesthesiology

## 2016-11-05 ENCOUNTER — Encounter (HOSPITAL_COMMUNITY): Payer: Self-pay

## 2016-11-05 ENCOUNTER — Encounter: Payer: Self-pay | Admitting: Cardiology

## 2016-11-05 DIAGNOSIS — Z87891 Personal history of nicotine dependence: Secondary | ICD-10-CM | POA: Diagnosis not present

## 2016-11-05 DIAGNOSIS — Z882 Allergy status to sulfonamides status: Secondary | ICD-10-CM | POA: Insufficient documentation

## 2016-11-05 DIAGNOSIS — I10 Essential (primary) hypertension: Secondary | ICD-10-CM | POA: Insufficient documentation

## 2016-11-05 DIAGNOSIS — E785 Hyperlipidemia, unspecified: Secondary | ICD-10-CM | POA: Diagnosis not present

## 2016-11-05 DIAGNOSIS — Z7901 Long term (current) use of anticoagulants: Secondary | ICD-10-CM | POA: Diagnosis not present

## 2016-11-05 DIAGNOSIS — I481 Persistent atrial fibrillation: Secondary | ICD-10-CM | POA: Insufficient documentation

## 2016-11-05 DIAGNOSIS — M1990 Unspecified osteoarthritis, unspecified site: Secondary | ICD-10-CM | POA: Diagnosis not present

## 2016-11-05 DIAGNOSIS — I482 Chronic atrial fibrillation: Secondary | ICD-10-CM | POA: Insufficient documentation

## 2016-11-05 DIAGNOSIS — Z96651 Presence of right artificial knee joint: Secondary | ICD-10-CM | POA: Diagnosis not present

## 2016-11-05 DIAGNOSIS — Z880 Allergy status to penicillin: Secondary | ICD-10-CM | POA: Diagnosis not present

## 2016-11-05 DIAGNOSIS — K219 Gastro-esophageal reflux disease without esophagitis: Secondary | ICD-10-CM | POA: Insufficient documentation

## 2016-11-05 DIAGNOSIS — Z86010 Personal history of colonic polyps: Secondary | ICD-10-CM | POA: Diagnosis not present

## 2016-11-05 HISTORY — PX: CARDIOVERSION: SHX1299

## 2016-11-05 SURGERY — CARDIOVERSION
Anesthesia: General

## 2016-11-05 MED ORDER — PROPOFOL 10 MG/ML IV BOLUS
INTRAVENOUS | Status: DC | PRN
  Administered 2016-11-05: 50 mg via INTRAVENOUS

## 2016-11-05 MED ORDER — SODIUM CHLORIDE 0.9 % IV SOLN: INTRAVENOUS | Status: DC

## 2016-11-05 MED ORDER — LIDOCAINE HCL (CARDIAC) 20 MG/ML IV SOLN
INTRAVENOUS | Status: DC | PRN
  Administered 2016-11-05: 3 mL via INTRATRACHEAL

## 2016-11-05 NOTE — Transfer of Care (Signed)
Immediate Anesthesia Transfer of Care Note  Patient: [name] [name]  Procedure(s) Performed: Procedure(s): CARDIOVERSION (N/A)  Patient Location: Endoscopy Unit  Anesthesia Type:MAC  Level of Consciousness: awake and alert   Airway & Oxygen Therapy: Patient Spontanous Breathing and Patient connected to nasal cannula oxygen  Post-op Assessment: Report given to RN and Post -op Vital signs reviewed and stable  Post vital signs: Reviewed and stable  Last Vitals:  Vitals:   11/05/16 0742  BP: 132/73  Pulse: 88  Resp: 19  Temp: 36.7 C    Last Pain:  Vitals:   11/05/16 0742  TempSrc: Oral         Complications: No apparent anesthesia complications

## 2016-11-05 NOTE — Discharge Instructions (Signed)
Electrical Cardioversion, Care After °This sheet gives you information about how to care for yourself after your procedure. Your health care provider may also give you more specific instructions. If you have problems or questions, contact your health care provider. °What can I expect after the procedure? °After the procedure, it is common to have: °· Some redness on the skin where the shocks were given. °Follow these instructions at home: °· Do not drive for 24 hours if you were given a medicine to help you relax (sedative). °· Take over-the-counter and prescription medicines only as told by your health care provider. °· Ask your health care provider how to check your pulse. Check it often. °· Rest for 48 hours after the procedure or as told by your health care provider. °· Avoid or limit your caffeine use as told by your health care provider. °Contact a health care provider if: °· You feel like your heart is beating too quickly or your pulse is not regular. °· You have a serious muscle cramp that does not go away. °Get help right away if: °· You have discomfort in your chest. °· You are dizzy or you feel faint. °· You have trouble breathing or you are short of breath. °· Your speech is slurred. °· You have trouble moving an arm or leg on one side of your body. °· Your fingers or toes turn cold or blue. °This information is not intended to replace advice given to you by your health care provider. Make sure you discuss any questions you have with your health care provider. °Document Released: 05/23/2013 Document Revised: 03/05/2016 Document Reviewed: 02/06/2016 °Elsevier Interactive Patient Education © 2017 [employer] ° °

## 2016-11-05 NOTE — Anesthesia Preprocedure Evaluation (Addendum)
Anesthesia Evaluation  Patient identified by MRN, date of birth, ID band Patient awake    Reviewed: Allergy & Precautions, NPO status , Patient's Chart, lab work & pertinent test results, reviewed documented beta blocker date and time   Airway Mallampati: II  TM Distance: >3 FB Neck ROM: Full    Dental  (+) Teeth Intact, Dental Advisory Given   Pulmonary former smoker,    Pulmonary exam normal breath sounds clear to auscultation       Cardiovascular hypertension, Pt. on home beta blockers and Pt. on medications + Peripheral Vascular Disease  + dysrhythmias Atrial Fibrillation  Rhythm:Irregular Rate:Normal  Echo 06/30/16: Study Conclusions  - Left ventricle: The cavity size was normal. Wall thickness was increased in a pattern of mild LVH. Systolic function was normal. The estimated ejection fraction was in the range of 50% to 55%. There is hypokinesis of the anteroseptal and apical myocardium.   The study is not technically sufficient to allow evaluation of LV diastolic function. - Aortic valve: Trileaflet; mildly thickened, mildly calcified   leaflets. There was mild regurgitation. - Aorta: The aorta was mildly calcified. - Mitral valve: There was mild regurgitation. - Left atrium: The atrium was mildly dilated. - Tricuspid valve: There was moderate regurgitation. - Pulmonary arteries: Systolic pressure was mildly increased. PA   peak pressure: 32 mm Hg (S).   Neuro/Psych h/o Polio negative psych ROS   GI/Hepatic Neg liver ROS, GERD  Medicated,  Endo/Other  negative endocrine ROS  Renal/GU negative Renal ROS     Musculoskeletal  (+) Arthritis , Osteoarthritis,    Abdominal   Peds  Hematology  (+) Blood dyscrasia (Pradaxa), ,   Anesthesia Other Findings Day of surgery medications reviewed with the patient.  Reproductive/Obstetrics                             Anesthesia  Physical Anesthesia Plan  ASA: III  Anesthesia Plan: General   Post-op Pain Management:    Induction: Intravenous  Airway Management Planned: Mask  Additional Equipment:   Intra-op Plan:   Post-operative Plan:   Informed Consent: I have reviewed the patients History and Physical, chart, labs and discussed the procedure including the risks, benefits and alternatives for the proposed anesthesia with the patient or authorized representative who has indicated his/her understanding and acceptance.   Dental advisory given  Plan Discussed with: CRNA, Anesthesiologist and Surgeon  Anesthesia Plan Comments:        Anesthesia Quick Evaluation

## 2016-11-05 NOTE — Interval H&P Note (Signed)
History and Physical Interval Note:  11/05/2016 8:41 AM  [name] [name]  has presented today for surgery, with the diagnosis of afib  The various methods of treatment have been discussed with the patient and family. After consideration of risks, benefits and other options for treatment, the patient has consented to  Procedure(s): CARDIOVERSION (N/A) as a surgical intervention .  The patient's history has been reviewed, patient examined, no change in status, stable for surgery.  I have reviewed the patient's chart and labs.  Questions were answered to the patient's satisfaction.     [name]

## 2016-11-05 NOTE — CV Procedure (Signed)
   Electrical Cardioversion Procedure Note [name] [name] [id number] 1941-04-07  Procedure: Electrical Cardioversion Indications:  Atrial Fibrillation  Time Out: Verified patient identification, verified procedure,medications/allergies/relevent history reviewed, required imaging and test results available.  Performed  Procedure Details  The patient was NPO after midnight. Anesthesia was administered at the beside  by Dr.Turk with 50mg  of propofol and 60mg  of Lidocaine.  Cardioversion was done with synchronized biphasic defibrillation with AP pads with 150watts.  The patient converted to normal sinus rhythm. The patient tolerated the procedure well   IMPRESSION:  Successful cardioversion of atrial fibrillation    [name] [name] 11/05/2016, 8:41 AM

## 2016-11-05 NOTE — Anesthesia Postprocedure Evaluation (Signed)
Anesthesia Post Note  Patient: [name] [name]  Procedure(s) Performed: Procedure(s) (LRB): CARDIOVERSION (N/A)  Patient location during evaluation: PACU Anesthesia Type: General Level of consciousness: awake and alert Pain management: pain level controlled Vital Signs Assessment: post-procedure vital signs reviewed and stable Respiratory status: spontaneous breathing, nonlabored ventilation, respiratory function stable and patient connected to nasal cannula oxygen Cardiovascular status: blood pressure returned to baseline and stable Postop Assessment: no signs of nausea or vomiting Anesthetic complications: no       Last Vitals:  Vitals:   11/05/16 0910 11/05/16 0920  BP: 127/62 126/64  Pulse: (!) 51 (!) 50  Resp: 14 16  Temp:      Last Pain:  Vitals:   11/05/16 0857  TempSrc: Oral                 Catalina Gravel

## 2016-11-05 NOTE — H&P (Signed)
Patient ID: Verlin Duke Rainone MRN: [id number], DOB/AGE: 19-Dec-1940   Admit date: 11/05/2016   Primary Physician: Binnie Rail, MD Primary Cardiologist: Dr. Stanford Breed   Pt. Profile:  76 y/o female with persistent atrial fibrillation, on chronic anticoagulation therapy, presenting for elective DCCV.   Problem List  Past Medical History:  Diagnosis Date  . Allergic rhinitis    takes Benadryl at bedtime  . Allergy   . Anemia   . Arthritis    osteoarthritis  . Atrial fibrillation ([place]) 06-10-2011   takes Diltiazem and Pradaxa daily  . Cancer of skin of leg   . Cataract    bilateral cataracts removed  . Clotting disorder ([place])    on PRADAXA - chronic a fib  . Collagenous colitis    recurrent, takes Budesonide daily as needed   . Colon polyps 01/14/2010   Hyperplastic  . Cough    states every day of her life and every chest xray is always clear  . GERD (gastroesophageal reflux disease)    takes Omeprazole daily  . History of poliomyelitis    Polio  age 89- no significant neuromuscular deficit  . Hyperlipidemia    was on Pravastatin but joint pain;has been off for 2-25months ;takes CoQ10  . Hypertension    takes Bisoprolol and Chlorthalidone daily  . Potassium deficiency    takes KDUR daily    Past Surgical History:  Procedure Laterality Date  . APPENDECTOMY     at age 70  . BREAST BIOPSY  1947 & 10/07   Benign  . cataracts removed    . COLONOSCOPY    . ENDOVENOUS ABLATION SAPHENOUS VEIN W/ LASER  03-03-2011 left greater saphenous vein   . ENDOVENOUS ABLATION SAPHENOUS VEIN W/ LASER  01-06-2011  right greater saphenous vein  . epidural infection     x 2   . SKIN CANCER EXCISION     removal of skin cancer on left leg  . TONSILLECTOMY     as a child  . TOTAL KNEE ARTHROPLASTY Right 09/18/2013   Procedure: TOTAL KNEE ARTHROPLASTY;  Surgeon: Garald Balding, MD;  Location: [facility];  Service: Orthopedics;  Laterality: Right;     Allergies  Allergies  Allergen  Reactions  . Augmentin [Amoxicillin-Pot Clavulanate] Diarrhea  . Levaquin [Levofloxacin In D5w]     Severe join pain, muscle pain, and tendonitis  . Polysporin [Bacitracin-Polymyxin B]     Blisters   . Pravastatin Other (See Comments)    Muscle pain and weakness  . Sulfonamide Derivatives Hives    Childhood reaction  . Erythromycin Swelling and Rash    HPI  The patient is a 75 y/o female with persistent atrial fibrillation, on chronic anticoagulation therapy, presenting for elective DCCV. She is followed by Dr. Stanford Breed. Also with a h/o HTN and HLD. No known CAD. She had a myoview in Nov 2012 that showed normal perfusion and normal EF. Echocardiogram November 2017 showed ejection fraction 50-55%, mild left ventricular hypertrophy, mild aortic and mitral regurgitation, mild left atrial enlargement and moderate tricuspid regurgitation. She has been on Pradaxa and reports full compliance w/o interruption. She is on BB therapy with bisoprolol. She was last seen by Dr. Stanford Breed, in clinic, on 09/14/16, and was noted to be in atrial fibrillation. She has been symptomatic with fatigue. Dr. Stanford Breed recommended attempt at DCCV to restore NSR.   She presents to [facility] today for the planned procedure. She is in afib still. Has been fully compliant with  Pradaxa w/o interruption for the last 4 weeks.   Home Medications  Prior to Admission medications   Medication Sig Start Date End Date Taking? Authorizing Provider  bisoprolol (ZEBETA) 5 MG tablet Take 1 tablet (5 mg total) by mouth daily. 09/14/16  Yes Lelon Perla, MD  dabigatran (PRADAXA) 150 MG CAPS capsule Take 1 capsule (150 mg total) by mouth every 12 (twelve) hours. 09/14/16  Yes Lelon Perla, MD  diltiazem (CARDIZEM CD) 360 MG 24 hr capsule Take 1 capsule (360 mg total) by mouth daily. 09/14/16  Yes Lelon Perla, MD  Doxylamine Succinate, Sleep, (UNISOM PO) Take by mouth at bedtime as needed.   Yes Historical Provider, MD  furosemide  (LASIX) 20 MG tablet Take 1 tablet (20 mg total) by mouth daily as needed. 09/14/16  Yes Lelon Perla, MD  spironolactone (ALDACTONE) 25 MG tablet Take 1 tablet (25 mg total) by mouth daily. 09/14/16 12/13/16 Yes Lelon Perla, MD  omeprazole (PRILOSEC) 20 MG capsule Take 1 capsule (20 mg total) by mouth daily. Patient not taking: Reported on 11/01/2016 11/24/15   Binnie Rail, MD  ranitidine (ZANTAC) 150 MG tablet Take 1 tablet (150 mg total) by mouth 2 (two) times daily. Patient taking differently: Take 150 mg by mouth at bedtime.  04/13/16   Binnie Rail, MD    Family History  Family History  Problem Relation Age of Onset  . Stomach cancer Maternal Grandfather   . Heart disease Paternal Grandfather   . Colon cancer Son   . Rectal cancer Son 22  . Colon cancer Paternal Grandmother   . Alcohol abuse Father   . Heart disease Father   . Stroke Father   . Hypertension Father   . Hypertension Sister   . Other Sister     varicose veins  . Peripheral vascular disease Mother   . Other Mother     varicose veins  . Esophageal cancer Neg Hx   . Pancreatic cancer Neg Hx     Social History  Social History   Social History  . Marital status: Married    Spouse name: N/A  . Number of children: N/A  . Years of education: N/A   Occupational History  . Not on file.   Social History Main Topics  . Smoking status: Former Smoker    Years: 18.00    Types: Cigarettes    Quit date: 08/17/1987  . Smokeless tobacco: Never Used     Comment: quit smoking 31yrs ago  . Alcohol use 4.2 oz/week    7 Glasses of wine per week     Comment: glass of wine daily  . Drug use: No  . Sexual activity: Yes    Birth control/ protection: Surgical   Other Topics Concern  . Not on file   Social History Narrative   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   Married     Former Smoker quit 1989    Alcohol use-yes -  glasses of wine/day      son works for [employer] - she flies to [place] to visit her  grandchildren     Review of Systems General:  No chills, fever, night sweats or weight changes.  Cardiovascular:  No chest pain, dyspnea on exertion, edema, orthopnea, palpitations, paroxysmal nocturnal dyspnea. Dermatological: No rash, lesions/masses Respiratory: No cough, dyspnea Urologic: No hematuria, dysuria Abdominal:   No nausea, vomiting, diarrhea, bright red blood per rectum, melena, or hematemesis  Neurologic:  No visual changes, wkns, changes in mental status. All other systems reviewed and are otherwise negative except as noted above.  Physical Exam  Blood pressure 132/73, pulse 88, temperature 98 F (36.7 C), temperature source Oral, resp. rate 19, height 5\' 5"  (1.651 m), weight 155 lb (70.3 kg), SpO2 100 %.  General: Pleasant, NAD Psych: Normal affect. Neuro: Alert and oriented X 3. Moves all extremities spontaneously. HEENT: Normal  Neck: Supple without bruits or JVD. Lungs:  Resp regular and unlabored, CTA. Heart: irregularly irregular rhythm, regular rate no s3, s4, or murmurs. Abdomen: Soft, non-tender, non-distended, BS + x 4.  Extremities: No clubbing, cyanosis or edema. DP/PT/Radials 2+ and equal bilaterally.  Labs  Troponin (Point of Care Test) No results for input(s): TROPIPOC in the last 72 hours. No results for input(s): CKTOTAL, CKMB, TROPONINI in the last 72 hours. Lab Results  Component Value Date   WBC 8.4 11/01/2016   HGB 13.5 04/14/2016   HCT 39.0 11/01/2016   MCV 96 11/01/2016   PLT 274 11/01/2016    Recent Labs Lab 11/01/16 1153  NA 133*  K 4.1  CL 98  CO2 25  BUN 24  CREATININE 0.93  CALCIUM 9.3  GLUCOSE 97   Lab Results  Component Value Date   CHOL 219 (H) 06/09/2015   HDL 91 06/09/2015   LDLCALC 115 06/09/2015   TRIG 66 06/09/2015   No results found for: DDIMER   Radiology/Studies  Xr Foot Complete Left  Result Date: 11/01/2016 3 views of the left foot and ankle were obtained. There is slight subsidence of the  navicular in  relation to the other midfoot bones. Mild ectopic calcification near the talar neck dorsally. No evidence of fracture or subluxation. No Significant degenerative changes in the midfoot hindfoot or forefoot  Xr Lumbar Spine 2-3 Views  Result Date: 11/01/2016 2 views of the lumbar spine were obtained in the AP and lateral projections. There is a degenerative right thoracic or lumbar scoliosis. Sacroiliac joints intact. Calcification all of the abdominal aorta diffusely without obvious aneurysmal dilatation. Considerable degenerative disc disease throughout the lumbar spine including the lower thoracic levels. No evidence of a spondylolisthesis  Xr Pelvis 1-2 Views  Result Date: 11/01/2016 AP the pelvis demonstrates significant cyst formation of both femoral heads. Joint spaces are well maintained.De reased bony mineralization.   ECG/Telemetry  Atrial fibrillation -- personally reviewed    ASSESSMENT AND PLAN  1. Symptomatic Persistent Atrial Fibrillation: patient reports full compliance with Pradaxa w/o interruption for the last 4 weeks. She has been symptomatic with fatigue. Plan for elective DCCV today w/ Dr. Radford Pax.   Signed, Lyda Jester, PA-C 11/05/2016, 7:51 AM

## 2016-11-08 ENCOUNTER — Ambulatory Visit: Payer: Medicare Other | Admitting: Cardiology

## 2016-11-09 NOTE — Addendum Note (Signed)
Addendum  created 11/09/16 1855 by Catalina Gravel, MD   Anesthesia Staff edited

## 2016-11-16 ENCOUNTER — Telehealth: Payer: Self-pay | Admitting: Cardiology

## 2016-11-16 DIAGNOSIS — I10 Essential (primary) hypertension: Secondary | ICD-10-CM

## 2016-11-16 DIAGNOSIS — I480 Paroxysmal atrial fibrillation: Secondary | ICD-10-CM

## 2016-11-16 MED ORDER — DILTIAZEM HCL ER COATED BEADS 240 MG PO CP24: 360.0000 mg | ORAL_CAPSULE | Freq: Every day | ORAL | Status: DC

## 2016-11-16 NOTE — Telephone Encounter (Signed)
Spoke with pt states that her BP/HR has been running low for the last 10 days since being cardioverted. She has also had intermittent dizziness and increased dizziness when she bends over. She states that she is taking all her medications as ordered  Along with Diltizem 360 mg. Her BP has been running last night 630-730pm 120/54 HR 53, 117/51 HR 49, 104/50 HR 50. Today before her meds between 830-9am 140/60 HR 55, 136/61 HR 56. Pt states that she took her med about 30 min ago(920am) but has not taken since taking medication.  Should she decrease her Dilt?  Please advise

## 2016-11-16 NOTE — Telephone Encounter (Signed)
Pt notified to decrease cardizem to 240 mg daily. She will start tomorrow. She will call when she needs refill.

## 2016-11-16 NOTE — Telephone Encounter (Signed)
Patient calling, states that she had an cardioversion 10 days ago and since then her heartrate has been slow. Patient states that she is light-headed and would like to discuss this. Thanks.

## 2016-11-16 NOTE — Telephone Encounter (Signed)
Change cardizem to 240 mg daily [name]

## 2016-11-17 ENCOUNTER — Telehealth: Payer: Self-pay | Admitting: Cardiology

## 2016-11-17 NOTE — Addendum Note (Signed)
Addended by: Cristopher Estimable on: 11/17/2016 09:25 AM   Modules accepted: Orders

## 2016-11-17 NOTE — Telephone Encounter (Signed)
Will forward for dr crenshaw review  

## 2016-11-17 NOTE — Telephone Encounter (Signed)
Would hold bisoprolol. [name]

## 2016-11-17 NOTE — Telephone Encounter (Signed)
Spoke with pt, Aware of dr crenshaw's recommendations.  °

## 2016-11-17 NOTE — Telephone Encounter (Signed)
New message      Pt c/o BP issue: STAT if pt c/o blurred vision, one-sided weakness or slurred speech  1. What are your last 5 BP readings? 121/54 HR 48 this am 2. Are you having any other symptoms (ex. Dizziness, headache, blurred vision, passed out)?  lightheadedness 3. What is your BP issue? Pt had cardioversion approx 1 week ago.  She want to know if she can decrease her bisoprolol to 1/2 tab daily.  She is already going to start diltiazem 240mg  but since her HR is still low, pt want to decrease bisoprolol.  Please call

## 2016-11-18 ENCOUNTER — Other Ambulatory Visit: Payer: Medicare Other

## 2016-11-22 NOTE — Progress Notes (Signed)
HPI: FU AFib. Patient had episode of atrial fibrillation in [place] in Oct 2012; treated with cardizem and converted spontaneously. TSH 0.901. Nuclear study Nov 2012 showed EF 69 and normal perfusion. Echocardiogram November 2017 showed ejection fraction 50-55%, mild left ventricular hypertrophy, mild aortic and mitral regurgitation, mild left atrial enlargement and moderate tricuspid regurgitation. Noted to be in recurrent atrial fibrillation at previous ov. Holter monitor December 2017 showed atrial fibrillation with rate mildly elevated. Patient had successful cardioversion on 11/05/2016. Following cardioversion she was found to be bradycardic and her medications were decreased. Since I last saw her, 2 weeks ago she developed recurrent palpitations and resumed her bisoprolol. She now feels well with no dyspnea, chest pain or syncope. She has chronic mild pedal edema.   Current Outpatient Prescriptions  Medication Sig Dispense Refill  . bisoprolol (ZEBETA) 10 MG tablet Take 10 mg by mouth daily.    . dabigatran (PRADAXA) 150 MG CAPS capsule Take 1 capsule (150 mg total) by mouth every 12 (twelve) hours. 180 capsule 3  . diltiazem (CARDIZEM CD) 240 MG 24 hr capsule Take 2 capsules (480 mg total) by mouth daily.    . Doxylamine Succinate, Sleep, (UNISOM PO) Take by mouth at bedtime as needed.    . furosemide (LASIX) 20 MG tablet Take 1 tablet (20 mg total) by mouth daily as needed. 90 tablet 3  . ranitidine (ZANTAC) 150 MG tablet Take 1 tablet (150 mg total) by mouth 2 (two) times daily. (Patient taking differently: Take 150 mg by mouth at bedtime. ) 60 tablet 5  . spironolactone (ALDACTONE) 25 MG tablet Take 1 tablet (25 mg total) by mouth daily. 90 tablet 3   No current facility-administered medications for this visit.      Past Medical History:  Diagnosis Date  . Allergic rhinitis    takes Benadryl at bedtime  . Allergy   . Anemia   . Arthritis    osteoarthritis  . Atrial  fibrillation ([place]) 06-10-2011   takes Diltiazem and Pradaxa daily  . Cancer of skin of leg   . Cataract    bilateral cataracts removed  . Clotting disorder ([place])    on PRADAXA - chronic a fib  . Collagenous colitis    recurrent, takes Budesonide daily as needed   . Colon polyps 01/14/2010   Hyperplastic  . Cough    states every day of her life and every chest xray is always clear  . GERD (gastroesophageal reflux disease)    takes Omeprazole daily  . History of poliomyelitis    Polio  age 57- no significant neuromuscular deficit  . Hyperlipidemia    was on Pravastatin but joint pain;has been off for 2-57months ;takes CoQ10  . Hypertension    takes Bisoprolol and Chlorthalidone daily  . Potassium deficiency    takes KDUR daily    Past Surgical History:  Procedure Laterality Date  . APPENDECTOMY     at age 61  . BREAST BIOPSY  1947 & 10/07   Benign  . CARDIOVERSION N/A 11/05/2016   Procedure: CARDIOVERSION;  Surgeon: Sueanne Margarita, MD;  [facility];  Service: Cardiovascular;  Laterality: N/A;  . cataracts removed    . COLONOSCOPY    . ENDOVENOUS ABLATION SAPHENOUS VEIN W/ LASER  03-03-2011 left greater saphenous vein   . ENDOVENOUS ABLATION SAPHENOUS VEIN W/ LASER  01-06-2011  right greater saphenous vein  . epidural infection     x 2   .  SKIN CANCER EXCISION     removal of skin cancer on left leg  . TONSILLECTOMY     as a child  . TOTAL KNEE ARTHROPLASTY Right 09/18/2013   Procedure: TOTAL KNEE ARTHROPLASTY;  Surgeon: Garald Balding, MD;  Location: [place];  Service: Orthopedics;  Laterality: Right;    Social History   Social History  . Marital status: Married    Spouse name: N/A  . Number of children: N/A  . Years of education: N/A   Occupational History  . Not on file.   Social History Main Topics  . Smoking status: Former Smoker    Years: 18.00    Types: Cigarettes    Quit date: 08/17/1987  . Smokeless tobacco: Never Used     Comment: quit  smoking 44yrs ago  . Alcohol use 4.2 oz/week    7 Glasses of wine per week     Comment: glass of wine daily  . Drug use: No  . Sexual activity: Yes    Birth control/ protection: Surgical   Other Topics Concern  . 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 Stomach cancer Maternal Grandfather   . Heart disease Paternal Grandfather   . Colon cancer Son   . Rectal cancer Son 28  . Colon cancer Paternal Grandmother   . Alcohol abuse Father   . Heart disease Father   . Stroke Father   . Hypertension Father   . Hypertension Sister   . Other Sister     varicose veins  . Peripheral vascular disease Mother   . Other Mother     varicose veins  . Esophageal cancer Neg Hx   . Pancreatic cancer Neg Hx     ROS: arthritis but no fevers or chills, productive cough, hemoptysis, dysphasia, odynophagia, melena, hematochezia, dysuria, hematuria, rash, seizure activity, orthopnea, PND, claudication. Remaining systems are negative.  Physical Exam: Well-developed well-nourished in no acute distress.  Skin is warm and dry.  HEENT is normal.  Neck is supple. No bruits Chest is clear to auscultation with normal expansion.  Cardiovascular exam is tachycardic and irregular Abdominal exam nontender or distended. No masses palpated. Extremities show trace edema. neuro grossly intact  ECG- Atrial fibrillation at a rate 107. No ST changes. personally reviewed  A/P  1 Paroxysmal atrial fibrillation-patient has developed recurrent atrial fibrillation following her recent cardioversion. However she is not symptomatic. She is not complaining of dyspnea, palpitations or fatigue. No chest pain. I therefore favor rate control long-term. Continue  bisoprolol. Heart rate is mildly elevated. Increase Cardizem to 360 mg daily. Continue pradaxa. I will see her back in approximately 3 months. If she develops symptoms we could consider adding an antiarrhythmic and repeating cardioversion.  2 Hypertension-blood pressure controlled. Continue present medications.   3 hyperlipidemia-continue diet. Patient has been intolerant to statins in the past.   [name], MD

## 2016-11-24 ENCOUNTER — Ambulatory Visit
Admission: RE | Admit: 2016-11-24 | Discharge: 2016-11-24 | Disposition: A | Discharge status: Home / Self Care | Payer: Medicare Other | Source: Ambulatory Visit | Attending: Orthopaedic Surgery | Admitting: Orthopaedic Surgery

## 2016-11-24 DIAGNOSIS — M160 Bilateral primary osteoarthritis of hip: Secondary | ICD-10-CM

## 2016-12-06 ENCOUNTER — Encounter: Payer: Self-pay | Admitting: Cardiology

## 2016-12-06 ENCOUNTER — Ambulatory Visit (INDEPENDENT_AMBULATORY_CARE_PROVIDER_SITE_OTHER): Payer: Medicare Other | Admitting: Cardiology

## 2016-12-06 ENCOUNTER — Ambulatory Visit (INDEPENDENT_AMBULATORY_CARE_PROVIDER_SITE_OTHER): Payer: Medicare Other | Admitting: Orthopaedic Surgery

## 2016-12-06 VITALS — BP 112/70 | HR 107 | Ht 66.0 in | Wt 164.4 lb

## 2016-12-06 DIAGNOSIS — E7800 Pure hypercholesterolemia, unspecified: Secondary | ICD-10-CM | POA: Diagnosis not present

## 2016-12-06 DIAGNOSIS — G8929 Other chronic pain: Secondary | ICD-10-CM | POA: Diagnosis not present

## 2016-12-06 DIAGNOSIS — M5442 Lumbago with sciatica, left side: Secondary | ICD-10-CM

## 2016-12-06 DIAGNOSIS — I10 Essential (primary) hypertension: Secondary | ICD-10-CM | POA: Diagnosis not present

## 2016-12-06 DIAGNOSIS — I480 Paroxysmal atrial fibrillation: Secondary | ICD-10-CM | POA: Diagnosis not present

## 2016-12-06 DIAGNOSIS — M5441 Lumbago with sciatica, right side: Secondary | ICD-10-CM

## 2016-12-06 MED ORDER — TRAMADOL HCL 50 MG PO TABS: ORAL_TABLET | ORAL | 0 refills | Status: DC

## 2016-12-06 MED ORDER — DILTIAZEM HCL ER COATED BEADS 360 MG PO CP24: 360.0000 mg | ORAL_CAPSULE | Freq: Every day | ORAL | 3 refills | Status: DC

## 2016-12-06 NOTE — Patient Instructions (Signed)
Medication Instructions:   INCREASE DILTIAZEM TO 360 MG ONCE DAILY  Follow-Up:  Your physician recommends that you schedule a follow-up appointment in: [place]   If you need a refill on your cardiac medications before your next appointment, please call your pharmacy.

## 2016-12-06 NOTE — Progress Notes (Signed)
Post-Op Visit Note   Patient: [name] [name]           Date of Birth: 1941-06-10           MRN: [id number] Visit Date: 12/06/2016 PCP: Binnie Rail, MD   Assessment & Plan:  Chief Complaint: Chronic low back pain associated with hip and knee discomfort. MRI scan of both hips recently performed demonstrating moderate degenerative changes both hips as well as I lateral peri-tendinosis and mild bilateral trochanteric bursitis Visit Diagnoses: I think her present problems are related to her back. She's had prior injections per Dr. Ernestina Patches many years ago when courses of physical therapy. He like to go back and see Dr. Ernestina Patches and we'll plan to see her back as needed. We'll also give her a prescription for tramadol that she can use very sparingly .  [name] has had recurrent problems with her back hips and knees over a long period time. She's had a prior MRI scan of the lumbar spine demonstrate diffuse degenerative changes associated with spinal stenosis. She's been experiencing back and bilateral hip pain at night and when she stands for a length of time. She recently has had a little bit more trouble on the left than the right hip. I ordered an MRI scan both right and left hip and she is here for the results. Present pain originates in the lower lumbar spine referred to both greater trochanteric region she does find that the longer she stands the more discomfort she'll experience. On exam straight leg raise is negative bilaterally. She had little if any pain with range of motion of either hip with internal/external rotation. No localized areas of tenderness about either greater trochanter. Some percussible tenderness of the lower lumbar spine. Despite  mild to moderate changes of arthritis in both hips associated with greater trochanter bursitis her pain appears to originate from her lumbar spine. I believe she is experiencing some claudication. I'd like Dr. Ernestina Patches to evaluate her and monitor her response.  Specifically he had be aggressive in knowing if her hip pain resolved with the injecting her lumbar spine. I'll also give a prescription for tramadol which she uses very sparingly  and, perhaps, only once twice a month. Office visit nearly 30 minutes in counseling and discussing all of the above.      Imaging: No results found.  PMFS History: Patient Active Problem List   Diagnosis Date Noted  . Localized edema 04/13/2016  . Fatigue 05/29/2015  . Osteoarthritis of right knee 09/21/2013  . S/P total knee replacement using cement 09/18/2013  . Anisocoria 07/26/2013  . Knee osteoarthritis 06/09/2012  . Varicose veins of lower extremities with other complications 78/58/8502  . Paroxysmal Atrial Fibrillation 06/29/2011  . Hypokalemia 06/29/2011  . COUGH, CHRONIC 03/09/2010  . HIP PAIN, BILATERAL 07/17/2009  . CHEST PAIN 03/31/2009  . ALLERGIC RHINITIS 04/23/2008  . GERD 11/01/2007  . Essential hypertension 09/20/2007  . SKIN CANCER, LEG 07/03/2007  . HYPERLIPIDEMIA 07/03/2007  . COLLAGENOUS COLITIS 07/03/2007  . POLIOMYELITIS, HX OF 07/03/2007   Past Medical History:  Diagnosis Date  . Allergic rhinitis    takes Benadryl at bedtime  . Allergy   . Anemia   . Arthritis    osteoarthritis  . Atrial fibrillation ([place]) 06-10-2011   takes Diltiazem and Pradaxa daily  . Cancer of skin of leg   . Cataract    bilateral cataracts removed  . Clotting disorder ([place])    on PRADAXA - chronic a  fib  . Collagenous colitis    recurrent, takes Budesonide daily as needed   . Colon polyps 01/14/2010   Hyperplastic  . Cough    states every day of her life and every chest xray is always clear  . GERD (gastroesophageal reflux disease)    takes Omeprazole daily  . History of poliomyelitis    Polio  age 66- no significant neuromuscular deficit  . Hyperlipidemia    was on Pravastatin but joint pain;has been off for 2-60months ;takes CoQ10  . Hypertension    takes Bisoprolol and  Chlorthalidone daily  . Potassium deficiency    takes KDUR daily    Family History  Problem Relation Age of Onset  . Stomach cancer Maternal Grandfather   . Heart disease Paternal Grandfather   . Colon cancer Son   . Rectal cancer Son 60  . Colon cancer Paternal Grandmother   . Alcohol abuse Father   . Heart disease Father   . Stroke Father   . Hypertension Father   . Hypertension Sister   . Other Sister     varicose veins  . Peripheral vascular disease Mother   . Other Mother     varicose veins  . Esophageal cancer Neg Hx   . Pancreatic cancer Neg Hx     Past Surgical History:  Procedure Laterality Date  . APPENDECTOMY     at age 64  . BREAST BIOPSY  1947 & 10/07   Benign  . CARDIOVERSION N/A 11/05/2016   Procedure: CARDIOVERSION;  Surgeon: Sueanne Margarita, MD;  [facility];  Service: Cardiovascular;  Laterality: N/A;  . cataracts removed    . COLONOSCOPY    . ENDOVENOUS ABLATION SAPHENOUS VEIN W/ LASER  03-03-2011 left greater saphenous vein   . ENDOVENOUS ABLATION SAPHENOUS VEIN W/ LASER  01-06-2011  right greater saphenous vein  . epidural infection     x 2   . SKIN CANCER EXCISION     removal of skin cancer on left leg  . TONSILLECTOMY     as a child  . TOTAL KNEE ARTHROPLASTY Right 09/18/2013   Procedure: TOTAL KNEE ARTHROPLASTY;  Surgeon: Garald Balding, MD;  Location: [facility];  Service: Orthopedics;  Laterality: Right;   Social History   Occupational History  . Not on file.   Social History Main Topics  . Smoking status: Former Smoker    Years: 18.00    Types: Cigarettes    Quit date: 08/17/1987  . Smokeless tobacco: Never Used     Comment: quit smoking 58yrs ago  . Alcohol use 4.2 oz/week    7 Glasses of wine per week     Comment: glass of wine daily  . Drug use: No  . Sexual activity: Yes    Birth control/ protection: Surgical

## 2016-12-23 ENCOUNTER — Ambulatory Visit (INDEPENDENT_AMBULATORY_CARE_PROVIDER_SITE_OTHER): Payer: Medicare Other | Admitting: Physical Medicine and Rehabilitation

## 2016-12-23 ENCOUNTER — Encounter (INDEPENDENT_AMBULATORY_CARE_PROVIDER_SITE_OTHER): Payer: Self-pay | Admitting: Physical Medicine and Rehabilitation

## 2016-12-23 VITALS — BP 130/80 | HR 93

## 2016-12-23 DIAGNOSIS — M47816 Spondylosis without myelopathy or radiculopathy, lumbar region: Secondary | ICD-10-CM | POA: Diagnosis not present

## 2016-12-23 DIAGNOSIS — M25552 Pain in left hip: Secondary | ICD-10-CM

## 2016-12-23 DIAGNOSIS — M545 Low back pain, unspecified: Secondary | ICD-10-CM

## 2016-12-23 DIAGNOSIS — M25551 Pain in right hip: Secondary | ICD-10-CM | POA: Diagnosis not present

## 2016-12-23 DIAGNOSIS — G8929 Other chronic pain: Secondary | ICD-10-CM

## 2016-12-23 NOTE — Progress Notes (Signed)
[name] [name] - 76 y.o. female MRN [id number]  Date of birth: [birth year]  Office Visit Note: Visit Date: 12/23/2016 PCP: Binnie Rail, MD Referred by: Binnie Rail, MD  Subjective: Chief Complaint  Patient presents with  . Lower Back - Pain   HPI: [name] [name] is a 76 year old female followed by Dr. Durward Fortes for her low back and bilateral hip pain. He has actually got to the point where he is obtained MRIs of both hips showing some degenerative arthritis but he has felt like her pain is really related to her lumbar spine. She had a prior lumbar spine MRI in 2011 and this is reviewed below. I haven't seen her since that time and we completed an epidural injection. She did have significant disc herniation at the time at L1-2. She's had no recent advanced MRI imaging of the lumbar spine. She has had x-rays of the lumbar spine through Dr. Durward Fortes which were unrevealing except for degenerative changes of the lumbar spine. Her pain is such that it is low back pain worse with standing and ambulating and twisting and turning. It radiates to the posterior lateral. She has not had recent intervention of her lumbar spine her prior lumbar surgery. This is been ongoing now for many months and is just really getting to the point where physically she's not able to do think she would like to do.    Review of Systems  Constitutional: Negative for chills, fever, malaise/fatigue and weight loss.  HENT: Negative for hearing loss and sinus pain.   Eyes: Negative for blurred vision, double vision and photophobia.  Respiratory: Negative for cough and shortness of breath.   Cardiovascular: Negative for chest pain, palpitations and leg swelling.  Gastrointestinal: Negative for abdominal pain, nausea and vomiting.  Genitourinary: Negative for flank pain.  Musculoskeletal: Positive for back pain. Negative for myalgias.  Skin: Negative for itching and rash.  Neurological: Negative for tremors, focal weakness and  weakness.  Endo/Heme/Allergies: Negative.   Psychiatric/Behavioral: Negative for depression.  All other systems reviewed and are negative.  Otherwise per HPI.  Assessment & Plan: Visit Diagnoses:  1. Chronic bilateral low back pain without sciatica   2. Spondylosis without myelopathy or radiculopathy, lumbar region   3. Pain in left hip   4. Pain in right hip     Plan: Findings:  Chronic worsening severe axial low back pain referring into the posterior lateral hips a little bit further but without true radicular symptoms down the legs. No paresthesias or focal weakness. I think this is probably facet joint mediated low back pain with a referral pattern into the buttock region. Prior MRI findings show this will moderate to large disc herniation at L1-2 which is likely resolved at this point since I was 2011. She does have significant advanced facet arthropathy at L4-5 and moderate to advanced at L5-S1. These facet joints probably have worsened over time and is been a significant number of years. She could have had some increased stenosis but she really had very little stenosis at the time. I think we could perform diagnostic facet joint blocks at L4-5 and L5-S1 to see how much relief she gets. If she gets a lot of relief we could focus therapy and hopefully just watch that. She ultimately could be a candidate for radiofrequency ablation. If her symptoms were relieved but did not last long or if they were not relieved much at all that I think it would be time for repeating the lumbar  spine MRI. Otherwise no change in medications at this point she has been using some tramadol for Dr. Durward Fortes. Her case is complicated by being on chronic anticoagulation, Pradaxa.    Meds & Orders: No orders of the defined types were placed in this encounter.  No orders of the defined types were placed in this encounter.   Follow-up: Return for Bilateral L4-5 and L5-S1 facet joint blocks.   Procedures: No  procedures performed  No notes on file   Clinical History: MRI lumbar spine 05/27/2010 L1-2: Moderate disc degeneration with disc space narrowing which has progressed. There is a moderate-to-large lateral disc protrusion on the left extending lateral to the foramen. There is adjacent soft tissue edema in this area suggesting this is a recent disc protrusion. Expected impingement of the left L1 nerve root. Mild facet degeneration without central canal stenosis.  L2-3: Disc degeneration with disc bulging and mild vertebral spurring. Mild facet degeneration and mild spinal stenosis. No change from the prior study.  L3-4: Mild disc degeneration and facet degeneration. Small central disc protrusion without significant spinal stenosis.  L4-5: Disc degeneration and disc bulging. Moderate to advanced facet degeneration with mild spinal stenosis. This is unchanged from the prior study. Mild foraminal encroachment on the right. Discogenic edema in the endplates on the right is unchanged from the prior study.  L5-S1: Moderate to advanced disc degeneration and spondylosis. Bilateral facet degeneration, unchanged.  IMPRESSION: Moderate to large disc protrusion at L1-2 lateral to the foramen with expected impingement of the left L1 nerve root. This has developed since the prior MRI.  Remaining degenerative changes in the lumbar spine are stable from the prior study.  She reports that she quit smoking about 29 years ago. Her smoking use included Cigarettes. She quit after 18.00 years of use. She has never used smokeless tobacco. No results for input(s): HGBA1C, LABURIC in the last 8760 hours.  Objective:  VS:  HT:    WT:   BMI:     BP:130/80  HR:93bpm  TEMP: ( )  RESP:  Physical Exam  Constitutional: She is oriented to person, place, and time. She appears well-developed and well-nourished.  Eyes: Conjunctivae and EOM are normal. Pupils are equal, round, and reactive to light.    Cardiovascular: Normal rate and intact distal pulses.   Pulmonary/Chest: Effort normal.  Musculoskeletal:  Patient ambulates without aid she is slow to rise from a seated position. She has no pain with hip internal rotation until the end ranges released. She has mild tenderness over the greater trochanters. She has good distal strength. She does have pain with extension rotation of the lumbar spine.  Neurological: She is alert and oriented to person, place, and time. She exhibits normal muscle tone. Coordination normal.  Skin: Skin is warm and dry. No rash noted. No erythema.  Psychiatric: She has a normal mood and affect. Her behavior is normal.  Nursing note and vitals reviewed.   Ortho Exam Imaging: No results found.  Past Medical/Family/Surgical/Social History: Medications & Allergies reviewed per EMR Patient Active Problem List   Diagnosis Date Noted  . Localized edema 04/13/2016  . Fatigue 05/29/2015  . Osteoarthritis of right knee 09/21/2013  . S/P total knee replacement using cement 09/18/2013  . Anisocoria 07/26/2013  . Knee osteoarthritis 06/09/2012  . Varicose veins of lower extremities with other complications 08/65/7846  . Paroxysmal Atrial Fibrillation 06/29/2011  . Hypokalemia 06/29/2011  . COUGH, CHRONIC 03/09/2010  . HIP PAIN, BILATERAL 07/17/2009  . CHEST PAIN  03/31/2009  . ALLERGIC RHINITIS 04/23/2008  . GERD 11/01/2007  . Essential hypertension 09/20/2007  . SKIN CANCER, LEG 07/03/2007  . HYPERLIPIDEMIA 07/03/2007  . COLLAGENOUS COLITIS 07/03/2007  . POLIOMYELITIS, HX OF 07/03/2007   Past Medical History:  Diagnosis Date  . Allergic rhinitis    takes Benadryl at bedtime  . Allergy   . Anemia   . Arthritis    osteoarthritis  . Atrial fibrillation ([place]) 06-10-2011   takes Diltiazem and Pradaxa daily  . Cancer of skin of leg   . Cataract    bilateral cataracts removed  . Clotting disorder ([place])    on PRADAXA - chronic a fib  . Collagenous colitis     recurrent, takes Budesonide daily as needed   . Colon polyps 01/14/2010   Hyperplastic  . Cough    states every day of her life and every chest xray is always clear  . GERD (gastroesophageal reflux disease)    takes Omeprazole daily  . History of poliomyelitis    Polio  age 42- no significant neuromuscular deficit  . Hyperlipidemia    was on Pravastatin but joint pain;has been off for 2-75months ;takes CoQ10  . Hypertension    takes Bisoprolol and Chlorthalidone daily  . Potassium deficiency    takes KDUR daily   Family History  Problem Relation Age of Onset  . Stomach cancer Maternal Grandfather   . Heart disease Paternal Grandfather   . Colon cancer Son   . Rectal cancer Son 88  . Colon cancer Paternal Grandmother   . Alcohol abuse Father   . Heart disease Father   . Stroke Father   . Hypertension Father   . Hypertension Sister   . Other Sister        varicose veins  . Peripheral vascular disease Mother   . Other Mother        varicose veins  . Esophageal cancer Neg Hx   . Pancreatic cancer Neg Hx    Past Surgical History:  Procedure Laterality Date  . APPENDECTOMY     at age 37  . BREAST BIOPSY  1947 & 10/07   Benign  . CARDIOVERSION N/A 11/05/2016   Procedure: CARDIOVERSION;  Surgeon: Sueanne Margarita, MD;  [facility];  Service: Cardiovascular;  Laterality: N/A;  . cataracts removed    . COLONOSCOPY    . ENDOVENOUS ABLATION SAPHENOUS VEIN W/ LASER  03-03-2011 left greater saphenous vein   . ENDOVENOUS ABLATION SAPHENOUS VEIN W/ LASER  01-06-2011  right greater saphenous vein  . epidural infection     x 2   . SKIN CANCER EXCISION     removal of skin cancer on left leg  . TONSILLECTOMY     as a child  . TOTAL KNEE ARTHROPLASTY Right 09/18/2013   Procedure: TOTAL KNEE ARTHROPLASTY;  Surgeon: Garald Balding, MD;  Location: [facility];  Service: Orthopedics;  Laterality: Right;   Social History   Occupational History  . Not on file.   Social History  Main Topics  . Smoking status: Former Smoker    Years: 18.00    Types: Cigarettes    Quit date: 08/17/1987  . Smokeless tobacco: Never Used     Comment: quit smoking 69yrs ago  . Alcohol use 4.2 oz/week    7 Glasses of wine per week     Comment: glass of wine daily  . Drug use: No  . Sexual activity: Yes    Birth control/ protection: Surgical

## 2016-12-23 NOTE — Progress Notes (Deleted)
Lower back pain and bilateral hip pain. Standing for long periods and getting up make pain worse. Feels better when moving. No pain when sitting still.

## 2016-12-27 ENCOUNTER — Encounter (INDEPENDENT_AMBULATORY_CARE_PROVIDER_SITE_OTHER): Payer: Self-pay | Admitting: Physical Medicine and Rehabilitation

## 2016-12-30 LAB — HM MAMMOGRAPHY

## 2017-01-02 DIAGNOSIS — M549 Dorsalgia, unspecified: Secondary | ICD-10-CM | POA: Insufficient documentation

## 2017-01-02 DIAGNOSIS — G8929 Other chronic pain: Secondary | ICD-10-CM | POA: Insufficient documentation

## 2017-01-02 NOTE — Progress Notes (Signed)
Subjective:    Patient ID: [name] [name], female    DOB: 1941-08-12, 76 y.o.   MRN: [id number]  HPI She is here for a physical exam.   Leg edema;  She is still having difficulty with edema in both legs.  She has seen vascular.  Her edema is more persistent.  She is taking lasix daily.  She is compliant with a low sodium diet.  She wears compression socks in the cooler weather.    Her husband has recurrent lung cancer and memory issues.    She is very active, but not exercising regularly.    Medications and allergies reviewed with patient and updated if appropriate.  Patient Active Problem List   Diagnosis Date Noted  . Hyperglycemia 01/03/2017  . DDD (degenerative disc disease), lumbar 01/03/2017  . Localized edema 04/13/2016  . Fatigue 05/29/2015  . Osteoarthritis of right knee 09/21/2013  . S/P total knee replacement using cement 09/18/2013  . Anisocoria 07/26/2013  . Varicose veins of lower extremities with other complications 93/57/0177  . Paroxysmal Atrial Fibrillation 06/29/2011  . COUGH, CHRONIC 03/09/2010  . HIP PAIN, BILATERAL 07/17/2009  . ALLERGIC RHINITIS 04/23/2008  . GERD 11/01/2007  . Essential hypertension 09/20/2007  . SKIN CANCER, LEG 07/03/2007  . HYPERLIPIDEMIA 07/03/2007  . COLLAGENOUS COLITIS 07/03/2007  . POLIOMYELITIS, HX OF 07/03/2007    Current Outpatient Prescriptions on File Prior to Visit  Medication Sig Dispense Refill  . bisoprolol (ZEBETA) 10 MG tablet Take 10 mg by mouth daily.    . dabigatran (PRADAXA) 150 MG CAPS capsule Take 1 capsule (150 mg total) by mouth every 12 (twelve) hours. 180 capsule 3  . diltiazem (CARDIZEM CD) 360 MG 24 hr capsule Take 1 capsule (360 mg total) by mouth daily. 90 capsule 3  . Doxylamine Succinate, Sleep, (UNISOM PO) Take by mouth at bedtime as needed.    . furosemide (LASIX) 20 MG tablet Take 1 tablet (20 mg total) by mouth daily as needed. 90 tablet 3  . ranitidine (ZANTAC) 150 MG tablet Take 1 tablet (150  mg total) by mouth 2 (two) times daily. (Patient taking differently: Take 150 mg by mouth at bedtime. ) 60 tablet 5  . traMADol (ULTRAM) 50 MG tablet 1-2 tablets po bid prn 40 tablet 0  . spironolactone (ALDACTONE) 25 MG tablet Take 1 tablet (25 mg total) by mouth daily. 90 tablet 3   No current facility-administered medications on file prior to visit.     Past Medical History:  Diagnosis Date  . Allergic rhinitis    takes Benadryl at bedtime  . Allergy   . Anemia   . Arthritis    osteoarthritis  . Atrial fibrillation ([place]) 06-10-2011   takes Diltiazem and Pradaxa daily  . Cancer of skin of leg   . Cataract    bilateral cataracts removed  . Clotting disorder ([place])    on PRADAXA - chronic a fib  . Collagenous colitis    recurrent, takes Budesonide daily as needed   . Colon polyps 01/14/2010   Hyperplastic  . Cough    states every day of her life and every chest xray is always clear  . GERD (gastroesophageal reflux disease)    takes Omeprazole daily  . History of poliomyelitis    Polio  age 21- no significant neuromuscular deficit  . Hyperlipidemia    was on Pravastatin but joint pain;has been off for 2-30months ;takes CoQ10  . Hypertension    takes Bisoprolol and  Chlorthalidone daily  . Potassium deficiency    takes KDUR daily    Past Surgical History:  Procedure Laterality Date  . APPENDECTOMY     at age 18  . BREAST BIOPSY  1947 & 10/07   Benign  . CARDIOVERSION N/A 11/05/2016   Procedure: CARDIOVERSION;  Surgeon: Sueanne Margarita, MD;  Location: [facility];  Service: Cardiovascular;  Laterality: N/A;  . cataracts removed    . COLONOSCOPY    . ENDOVENOUS ABLATION SAPHENOUS VEIN W/ LASER  03-03-2011 left greater saphenous vein   . ENDOVENOUS ABLATION SAPHENOUS VEIN W/ LASER  01-06-2011  right greater saphenous vein  . epidural infection     x 2   . SKIN CANCER EXCISION     removal of skin cancer on left leg  . TONSILLECTOMY     as a child  . TOTAL KNEE  ARTHROPLASTY Right 09/18/2013   Procedure: TOTAL KNEE ARTHROPLASTY;  Surgeon: Garald Balding, MD;  Location: [facility];  Service: Orthopedics;  Laterality: Right;    Social History   Social History  . Marital status: Married    Spouse name: N/A  . Number of children: N/A  . Years of education: N/A   Social History Main Topics  . Smoking status: Former Smoker    Years: 18.00    Types: Cigarettes    Quit date: 08/17/1987  . Smokeless tobacco: Never Used     Comment: quit smoking 67yrs ago  . Alcohol use 4.2 oz/week    7 Glasses of wine per week     Comment: glass of wine daily  . Drug use: No  . Sexual activity: Yes    Birth control/ protection: Surgical   Other Topics Concern  . 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 Stomach cancer Maternal Grandfather   . Heart disease Paternal Grandfather   . Colon cancer Son   . Rectal cancer Son 15  . Colon cancer Paternal Grandmother   . Alcohol abuse Father   . Heart disease Father   . Stroke Father   . Hypertension Father   . Hypertension Sister   . Other Sister        varicose veins  . Peripheral vascular disease Mother   . Other Mother        varicose veins  . Esophageal cancer Neg Hx   . Pancreatic cancer Neg Hx     Review of Systems  Constitutional: Positive for fever. Negative for appetite change, chills and fatigue.  Eyes: Negative for visual disturbance.  Respiratory: Positive for cough (productive every morning - no cough rest of day). Negative for shortness of breath and wheezing.   Cardiovascular: Positive for leg swelling. Negative for chest pain and palpitations.  Gastrointestinal: Negative for abdominal pain, blood in stool, constipation, diarrhea and nausea.        Gerd controlled  Genitourinary: Negative for dysuria and hematuria.  Musculoskeletal: Positive for back pain.  Skin: Negative for color change and rash.  Neurological: Negative for light-headedness and headaches.  Psychiatric/Behavioral: Negative for dysphoric mood. The patient is nervous/anxious (mild).  Objective:   Vitals:   01/03/17 1421  BP: 120/84  Pulse: 63  Resp: 16  Temp: 98 F (36.7 C)   Filed Weights   01/03/17 1421  Weight: 166 lb (75.3 kg)   Body mass index is 26.79 kg/m.  Wt Readings from Last 3 Encounters:  01/03/17 166 lb (75.3 kg)  12/06/16 164 lb 6.4 oz (74.6 kg)  11/05/16 155 lb (70.3 kg)     Physical Exam Constitutional: She appears well-developed and well-nourished. No distress.  HENT:  Head: Normocephalic and atraumatic.  Right Ear: External ear normal. Normal ear canal and TM Left Ear: External ear normal.  Normal ear canal and TM Mouth/Throat: Oropharynx is clear and moist.  Eyes: Conjunctivae and EOM are normal.  Neck: Neck supple. No tracheal deviation present. No thyromegaly present.  No carotid bruit  Cardiovascular: Normal rate, regular rhythm and normal heart sounds.   No murmur heard.  No edema. Pulmonary/Chest: Effort normal and breath sounds normal. No respiratory distress. She has no wheezes. She has no rales.  Breast: deferred to Gyn Abdominal: Soft. She exhibits no distension. There is no tenderness.  Lymphadenopathy: She has no cervical adenopathy.  Skin: Skin is warm and dry. She is not diaphoretic.  Psychiatric: She has a normal mood and affect. Her behavior is normal.         Assessment & Plan:   Physical exam: Screening blood work   ordered Immunizations  Up to date, discussed shingrix Colonoscopy done 2017, Up to date  Mammogram  Up to date  - done 12/2015 Dexa - up to date - monitored by gyn Gyn - up to date Eye exams  Up to date  EKG    done by cardio 11/2016 Exercise - very active in garden and around  house, no formal exercise Weight  Advised weight loss Skin no concerns - sees derm annually Substance abuse  none  See Problem List for Assessment and Plan of chronic medical problems.

## 2017-01-02 NOTE — Assessment & Plan Note (Addendum)
Taking cardizem and pradaxa persistent Rate controlled Following with cardiology

## 2017-01-02 NOTE — Assessment & Plan Note (Signed)
Has seen ortho  -  Has started PT and will see Dr Lucia Gaskins for injections

## 2017-01-02 NOTE — Assessment & Plan Note (Addendum)
GERD controlled Continue twice daily zantac

## 2017-01-02 NOTE — Patient Instructions (Addendum)
Test(s) ordered today. Your results will be released to [facility] (or called to you) after review, usually within 72hours after test completion. If any changes need to be made, you will be notified at that same time.  All other Health Maintenance issues reviewed.   All recommended immunizations and age-appropriate screenings are up-to-date or discussed.  No immunizations administered today. Consider having a new shingles vaccine.   Medications reviewed and updated. No changes recommended at this time.  Your prescription(s) have been submitted to your pharmacy. Please take as directed and contact our office if you believe you are having problem(s) with the medication(s).   Please followup in one year with me for a physical  Please schedule a wellness visit with [facility] Maintenance, Female Adopting a healthy lifestyle and getting preventive care can go a long way to promote health and wellness. Talk with your health care provider about what schedule of regular examinations is right for you. This is a good chance for you to check in with your provider about disease prevention and staying healthy. In between checkups, there are plenty of things you can do on your own. Experts have done a lot of research about which lifestyle changes and preventive measures are most likely to keep you healthy. Ask your health care provider for more information. Weight and diet Eat a healthy diet  Be sure to include plenty of vegetables, fruits, low-fat dairy products, and lean protein.  Do not eat a lot of foods high in solid fats, added sugars, or salt.  Get regular exercise. This is one of the most important things you can do for your health.  Most adults should exercise for at least 150 minutes each week. The exercise should increase your heart rate and make you sweat (moderate-intensity exercise).  Most adults should also do strengthening exercises at least twice a week. This is in addition to the  moderate-intensity exercise. Maintain a healthy weight  Body mass index (BMI) is a measurement that can be used to identify possible weight problems. It estimates body fat based on height and weight. Your health care provider can help determine your BMI and help you achieve or maintain a healthy weight.  For females 49 years of age and older:  A BMI below 18.5 is considered underweight.  A BMI of 18.5 to 24.9 is normal.  A BMI of 25 to 29.9 is considered overweight.  A BMI of 30 and above is considered obese. Watch levels of cholesterol and blood lipids  You should start having your blood tested for lipids and cholesterol at 76 years of age, then have this test every 5 years.  You may need to have your cholesterol levels checked more often if:  Your lipid or cholesterol levels are high.  You are older than 76 years of age.  You are at high risk for heart disease. Cancer screening Lung Cancer  Lung cancer screening is recommended for adults 74-92 years old who are at high risk for lung cancer because of a history of smoking.  A yearly low-dose CT scan of the lungs is recommended for people who:  Currently smoke.  Have quit within the past 15 years.  Have at least a 30-pack-year history of smoking. A pack year is smoking an average of one pack of cigarettes a day for 1 year.  Yearly screening should continue until it has been 15 years since you quit.  Yearly screening should stop if you develop a health problem that  would prevent you from having lung cancer treatment. Breast Cancer  Practice breast self-awareness. This means understanding how your breasts normally appear and feel.  It also means doing regular breast self-exams. Let your health care provider know about any changes, no matter how small.  If you are in your 20s or 30s, you should have a clinical breast exam (CBE) by a health care provider every 1-3 years as part of a regular health exam.  If you are 7 or  older, have a CBE every year. Also consider having a breast X-ray (mammogram) every year.  If you have a family history of breast cancer, talk to your health care provider about genetic screening.  If you are at high risk for breast cancer, talk to your health care provider about having an MRI and a mammogram every year.  Breast cancer gene (BRCA) assessment is recommended for women who have family members with BRCA-related cancers. BRCA-related cancers include:  Breast.  Ovarian.  Tubal.  Peritoneal cancers.  Results of the assessment will determine the need for genetic counseling and BRCA1 and BRCA2 testing. Cervical Cancer  Your health care provider may recommend that you be screened regularly for cancer of the pelvic organs (ovaries, uterus, and vagina). This screening involves a pelvic examination, including checking for microscopic changes to the surface of your cervix (Pap test). You may be encouraged to have this screening done every 3 years, beginning at age 43.  For women ages 41-65, health care providers may recommend pelvic exams and Pap testing every 3 years, or they may recommend the Pap and pelvic exam, combined with testing for human papilloma virus (HPV), every 5 years. Some types of HPV increase your risk of cervical cancer. Testing for HPV may also be done on women of any age with unclear Pap test results.  Other health care providers may not recommend any screening for nonpregnant women who are considered low risk for pelvic cancer and who do not have symptoms. Ask your health care provider if a screening pelvic exam is right for you.  If you have had past treatment for cervical cancer or a condition that could lead to cancer, you need Pap tests and screening for cancer for at least 20 years after your treatment. If Pap tests have been discontinued, your risk factors (such as having a new sexual partner) need to be reassessed to determine if screening should resume. Some  women have medical problems that increase the chance of getting cervical cancer. In these cases, your health care provider may recommend more frequent screening and Pap tests. Colorectal Cancer  This type of cancer can be detected and often prevented.  Routine colorectal cancer screening usually begins at 75 years of age and continues through 76 years of age.  Your health care provider may recommend screening at an earlier age if you have risk factors for colon cancer.  Your health care provider may also recommend using home test kits to check for hidden blood in the stool.  A small camera at the end of a tube can be used to examine your colon directly (sigmoidoscopy or colonoscopy). This is done to check for the earliest forms of colorectal cancer.  Routine screening usually begins at age 57.  Direct examination of the colon should be repeated every 5-10 years through 76 years of age. However, you may need to be screened more often if early forms of precancerous polyps or small growths are found. Skin Cancer  Check your skin from  head to toe regularly.  Tell your health care provider about any new moles or changes in moles, especially if there is a change in a mole's shape or color.  Also tell your health care provider if you have a mole that is larger than the size of a pencil eraser.  Always use sunscreen. Apply sunscreen liberally and repeatedly throughout the day.  Protect yourself by wearing long sleeves, pants, a wide-brimmed hat, and sunglasses whenever you are outside. Heart disease, diabetes, and high blood pressure  High blood pressure causes heart disease and increases the risk of stroke. High blood pressure is more likely to develop in:  People who have blood pressure in the high end of the normal range (130-139/85-89 mm Hg).  People who are overweight or obese.  People who are African American.  If you are 19-30 years of age, have your blood pressure checked every  3-5 years. If you are 51 years of age or older, have your blood pressure checked every year. You should have your blood pressure measured twice-once when you are at a hospital or clinic, and once when you are not at a hospital or clinic. Record the average of the two measurements. To check your blood pressure when you are not at a hospital or clinic, you can use:  An automated blood pressure machine at a pharmacy.  A home blood pressure monitor.  If you are between 28 years and 61 years old, ask your health care provider if you should take aspirin to prevent strokes.  Have regular diabetes screenings. This involves taking a blood sample to check your fasting blood sugar level.  If you are at a normal weight and have a low risk for diabetes, have this test once every three years after 76 years of age.  If you are overweight and have a high risk for diabetes, consider being tested at a younger age or more often. Preventing infection Hepatitis B  If you have a higher risk for hepatitis B, you should be screened for this virus. You are considered at high risk for hepatitis B if:  You were born in a country where hepatitis B is common. Ask your health care provider which countries are considered high risk.  Your parents were born in a high-risk country, and you have not been immunized against hepatitis B (hepatitis B vaccine).  You have HIV or AIDS.  You use needles to inject street drugs.  You live with someone who has hepatitis B.  You have had sex with someone who has hepatitis B.  You get hemodialysis treatment.  You take certain medicines for conditions, including cancer, organ transplantation, and autoimmune conditions. Hepatitis C  Blood testing is recommended for:  Everyone born from [birth year] through 1965.  Anyone with known risk factors for hepatitis C. Sexually transmitted infections (STIs)  You should be screened for sexually transmitted infections (STIs) including  gonorrhea and chlamydia if:  You are sexually active and are younger than 76 years of age.  You are older than 76 years of age and your health care provider tells you that you are at risk for this type of infection.  Your sexual activity has changed since you were last screened and you are at an increased risk for chlamydia or gonorrhea. Ask your health care provider if you are at risk.  If you do not have HIV, but are at risk, it may be recommended that you take a prescription medicine daily to prevent HIV infection. This  is called pre-exposure prophylaxis (PrEP). You are considered at risk if:  You are sexually active and do not regularly use condoms or know the HIV status of your partner(s).  You take drugs by injection.  You are sexually active with a partner who has HIV. Talk with your health care provider about whether you are at high risk of being infected with HIV. If you choose to begin PrEP, you should first be tested for HIV. You should then be tested every 3 months for as long as you are taking PrEP. Pregnancy  If you are premenopausal and you may become pregnant, ask your health care provider about preconception counseling.  If you may become pregnant, take 400 to 800 micrograms (mcg) of folic acid every day.  If you want to prevent pregnancy, talk to your health care provider about birth control (contraception). Osteoporosis and menopause  Osteoporosis is a disease in which the bones lose minerals and strength with aging. This can result in serious bone fractures. Your risk for osteoporosis can be identified using a bone density scan.  If you are 62 years of age or older, or if you are at risk for osteoporosis and fractures, ask your health care provider if you should be screened.  Ask your health care provider whether you should take a calcium or vitamin D supplement to lower your risk for osteoporosis.  Menopause may have certain physical symptoms and risks.  Hormone  replacement therapy may reduce some of these symptoms and risks. Talk to your health care provider about whether hormone replacement therapy is right for you. Follow these instructions at home:  Schedule regular health, dental, and eye exams.  Stay current with your immunizations.  Do not use any tobacco products including cigarettes, chewing tobacco, or electronic cigarettes.  If you are pregnant, do not drink alcohol.  If you are breastfeeding, limit how much and how often you drink alcohol.  Limit alcohol intake to no more than 1 drink per day for nonpregnant women. One drink equals 12 ounces of beer, 5 ounces of wine, or 1 ounces of hard liquor.  Do not use street drugs.  Do not share needles.  Ask your health care provider for help if you need support or information about quitting drugs.  Tell your health care provider if you often feel depressed.  Tell your health care provider if you have ever been abused or do not feel safe at home. This information is not intended to replace advice given to you by your health care provider. Make sure you discuss any questions you have with your health care provider. Document Released: 02/15/2011 Document Revised: 01/08/2016 Document Reviewed: 05/06/2015 Elsevier Interactive Patient Education  2017 [employer].

## 2017-01-03 ENCOUNTER — Encounter: Payer: Self-pay | Admitting: Internal Medicine

## 2017-01-03 ENCOUNTER — Ambulatory Visit (INDEPENDENT_AMBULATORY_CARE_PROVIDER_SITE_OTHER): Payer: Medicare Other | Admitting: Internal Medicine

## 2017-01-03 VITALS — BP 120/84 | HR 63 | Temp 98.0°F | Resp 16 | Wt 166.0 lb

## 2017-01-03 DIAGNOSIS — E7800 Pure hypercholesterolemia, unspecified: Secondary | ICD-10-CM | POA: Diagnosis not present

## 2017-01-03 DIAGNOSIS — M5136 Other intervertebral disc degeneration, lumbar region: Secondary | ICD-10-CM

## 2017-01-03 DIAGNOSIS — Z0000 Encounter for general adult medical examination without abnormal findings: Secondary | ICD-10-CM | POA: Diagnosis not present

## 2017-01-03 DIAGNOSIS — I10 Essential (primary) hypertension: Secondary | ICD-10-CM

## 2017-01-03 DIAGNOSIS — I481 Persistent atrial fibrillation: Secondary | ICD-10-CM | POA: Diagnosis not present

## 2017-01-03 DIAGNOSIS — R739 Hyperglycemia, unspecified: Secondary | ICD-10-CM

## 2017-01-03 DIAGNOSIS — K219 Gastro-esophageal reflux disease without esophagitis: Secondary | ICD-10-CM

## 2017-01-03 DIAGNOSIS — I4819 Other persistent atrial fibrillation: Secondary | ICD-10-CM

## 2017-01-03 MED ORDER — RANITIDINE HCL 150 MG PO TABS: 150.0000 mg | ORAL_TABLET | Freq: Two times a day (BID) | ORAL | 3 refills | Status: DC

## 2017-01-03 NOTE — Assessment & Plan Note (Signed)
Check lipids Has not tolerated statins

## 2017-01-03 NOTE — Assessment & Plan Note (Signed)
Check a1c 

## 2017-01-03 NOTE — Assessment & Plan Note (Signed)
BP well controlled Current regimen effective and well tolerated Continue current medications at current doses  

## 2017-01-03 NOTE — Assessment & Plan Note (Signed)
Sees Dr Durward Fortes Taking tramadol as needed Will see Dr Ernestina Patches for injection

## 2017-01-04 ENCOUNTER — Other Ambulatory Visit (INDEPENDENT_AMBULATORY_CARE_PROVIDER_SITE_OTHER): Payer: Medicare Other

## 2017-01-04 DIAGNOSIS — I10 Essential (primary) hypertension: Secondary | ICD-10-CM | POA: Diagnosis not present

## 2017-01-04 DIAGNOSIS — Z0000 Encounter for general adult medical examination without abnormal findings: Secondary | ICD-10-CM | POA: Diagnosis not present

## 2017-01-04 DIAGNOSIS — R739 Hyperglycemia, unspecified: Secondary | ICD-10-CM

## 2017-01-04 DIAGNOSIS — I4819 Other persistent atrial fibrillation: Secondary | ICD-10-CM

## 2017-01-04 DIAGNOSIS — I481 Persistent atrial fibrillation: Secondary | ICD-10-CM

## 2017-01-04 LAB — CBC WITH DIFFERENTIAL/PLATELET
Basophils Absolute: 0.1 10*3/uL (ref 0.0–0.1)
Basophils Relative: 0.9 % (ref 0.0–3.0)
EOS ABS: 0.1 10*3/uL (ref 0.0–0.7)
Eosinophils Relative: 1.9 % (ref 0.0–5.0)
HEMATOCRIT: 38.2 % (ref 36.0–46.0)
HEMOGLOBIN: 13.2 g/dL (ref 12.0–15.0)
LYMPHS PCT: 26.1 % (ref 12.0–46.0)
Lymphs Abs: 1.6 10*3/uL (ref 0.7–4.0)
MCHC: 34.5 g/dL (ref 30.0–36.0)
MCV: 98.3 fl (ref 78.0–100.0)
Monocytes Absolute: 0.8 10*3/uL (ref 0.1–1.0)
Monocytes Relative: 12.6 % — ABNORMAL HIGH (ref 3.0–12.0)
Neutro Abs: 3.7 10*3/uL (ref 1.4–7.7)
Neutrophils Relative %: 58.5 % (ref 43.0–77.0)
Platelets: 268 10*3/uL (ref 150.0–400.0)
RBC: 3.88 Mil/uL (ref 3.87–5.11)
RDW: 13.6 % (ref 11.5–15.5)
WBC: 6.3 10*3/uL (ref 4.0–10.5)

## 2017-01-04 LAB — COMPREHENSIVE METABOLIC PANEL
ALBUMIN: 4.3 g/dL (ref 3.5–5.2)
ALT: 11 U/L (ref 0–35)
AST: 15 U/L (ref 0–37)
Alkaline Phosphatase: 65 U/L (ref 39–117)
BILIRUBIN TOTAL: 0.6 mg/dL (ref 0.2–1.2)
BUN: 23 mg/dL (ref 6–23)
CO2: 29 meq/L (ref 19–32)
CREATININE: 0.92 mg/dL (ref 0.40–1.20)
Calcium: 9.5 mg/dL (ref 8.4–10.5)
Chloride: 103 mEq/L (ref 96–112)
GFR: 63.05 mL/min (ref >60.00)
Glucose, Bld: 100 mg/dL — ABNORMAL HIGH (ref 70–99)
Potassium: 4.6 mEq/L (ref 3.5–5.1)
Sodium: 139 mEq/L (ref 135–145)
TOTAL PROTEIN: 7.1 g/dL (ref 6.0–8.3)

## 2017-01-04 LAB — LIPID PANEL
CHOL/HDL RATIO: 4
Cholesterol: 194 mg/dL (ref 0–200)
HDL: 50.4 mg/dL (ref >39.00)
LDL Cholesterol: 127 mg/dL — ABNORMAL HIGH (ref 0–99)
NonHDL: 143.46
Triglycerides: 80 mg/dL (ref 0.0–149.0)
VLDL: 16 mg/dL (ref 0.0–40.0)

## 2017-01-04 LAB — HEMOGLOBIN A1C: HEMOGLOBIN A1C: 5.6 % (ref 4.6–6.5)

## 2017-01-04 LAB — TSH: TSH: 1.35 u[IU]/mL (ref 0.35–4.50)

## 2017-01-05 ENCOUNTER — Encounter: Payer: Self-pay | Admitting: Internal Medicine

## 2017-01-11 ENCOUNTER — Encounter: Payer: Self-pay | Admitting: Internal Medicine

## 2017-01-11 NOTE — Progress Notes (Unsigned)
Results entered and sent to scan  

## 2017-01-21 ENCOUNTER — Ambulatory Visit (INDEPENDENT_AMBULATORY_CARE_PROVIDER_SITE_OTHER): Payer: Medicare Other

## 2017-01-21 ENCOUNTER — Encounter (INDEPENDENT_AMBULATORY_CARE_PROVIDER_SITE_OTHER): Payer: Self-pay | Admitting: Physical Medicine and Rehabilitation

## 2017-01-21 ENCOUNTER — Ambulatory Visit (INDEPENDENT_AMBULATORY_CARE_PROVIDER_SITE_OTHER): Payer: Medicare Other | Admitting: Physical Medicine and Rehabilitation

## 2017-01-21 VITALS — BP 143/88 | HR 104 | Temp 97.9°F

## 2017-01-21 DIAGNOSIS — M47816 Spondylosis without myelopathy or radiculopathy, lumbar region: Secondary | ICD-10-CM

## 2017-01-21 DIAGNOSIS — S39012A Strain of muscle, fascia and tendon of lower back, initial encounter: Secondary | ICD-10-CM | POA: Diagnosis not present

## 2017-01-21 DIAGNOSIS — M533 Sacrococcygeal disorders, not elsewhere classified: Secondary | ICD-10-CM | POA: Diagnosis not present

## 2017-01-21 DIAGNOSIS — M7918 Myalgia, other site: Secondary | ICD-10-CM

## 2017-01-21 DIAGNOSIS — M791 Myalgia: Secondary | ICD-10-CM

## 2017-01-21 MED ORDER — LIDOCAINE HCL (PF) 1 % IJ SOLN: 2.0000 mL | Freq: Once | INTRAMUSCULAR | Status: DC

## 2017-01-21 MED ORDER — METHYLPREDNISOLONE ACETATE 80 MG/ML IJ SUSP: 80.0000 mg | Freq: Once | INTRAMUSCULAR | Status: DC

## 2017-01-21 NOTE — Patient Instructions (Signed)

## 2017-01-21 NOTE — Progress Notes (Signed)
Here for planned facet injection. No changes to original pain. [place] about 10 days ago and is now having coccyx pain and buttock pain worse on left. Moving around increases pain. Stairs are the most painful.

## 2017-01-24 NOTE — Progress Notes (Signed)
[name] [name] - 76 y.o. female MRN [id number]  Date of birth: [birth year]  Office Visit Note: Visit Date: 01/21/2017 PCP: Binnie Rail, MD Referred by: Binnie Rail, MD  Subjective: Chief Complaint  Patient presents with  . Lower Back - Pain   HPI: [name] [name] is a very pleasant and active 76 year old female with history of lumbar spine osteoarthritis. She was coming in today for planned bilateral L4-5 and L5-S1 facet joint blocks for axial low back pain. We have seen her recently for reevaluation and those notes can be reviewed. Unfortunately while she was waiting for preauthorization she had a fall. This was about 10 days ago when she was outside walking on some wooden areas with it was raining and she was actually wearing cleats that she uses to go up the sand at the beach. She states that she really slipped and her feet went out from under her and she fell directly on her left buttock area and tell bone and coccyx. She is having coccydynia at this point and a great deal of pain in the left buttock. She is able to sit on it fairly well however. She's had no paresthesias or focal weakness. She was concerned about the pain note minimal to bring this up today. She has been using some heat and ice and she still stays mobile. We will obtain AP of the pelvis today to see if there is any obvious fracture. She is able to bear weight. We will go ahead today and complete the facet joint injections as well since that was planned and approved and I don't really see any reason not to do that.    Review of Systems  Constitutional: Negative for chills, fever, malaise/fatigue and weight loss.  HENT: Negative for hearing loss and sinus pain.   Eyes: Negative for blurred vision, double vision and photophobia.  Respiratory: Negative for cough and shortness of breath.   Cardiovascular: Negative for chest pain, palpitations and leg swelling.  Gastrointestinal: Negative for abdominal pain, nausea and vomiting.    Genitourinary: Negative for flank pain.  Musculoskeletal: Positive for back pain. Negative for myalgias.       Coccydynia  Skin: Negative for itching and rash.  Neurological: Negative for tremors, focal weakness and weakness.  Endo/Heme/Allergies: Negative.   Psychiatric/Behavioral: Negative for depression.  All other systems reviewed and are negative.  Otherwise per HPI.  Assessment & Plan: Visit Diagnoses:  1. Strain, sacral, initial encounter   2. Buttock pain   3. Spondylosis without myelopathy or radiculopathy, lumbar region   4. Coccygodynia     Plan: Findings:  Chronic history of axial low back pain with history of facet joint arthritis and some level of stenosis. MRI from 2011. Now with acute buttock pain and coccydynia after fall 10 days ago. We did look at AP x-rays of the pelvis with no fractures or dislocations. I think she has a sprain strain of the area and especially ligaments around the coccyx. This will heal on its own most likely. Consideration will be given to coccyx injection depending on length of time that the pain is there. She should continue to stay active. We are going to complete bilateral facet joint blocks as planned for her low back pain has been really bothering her. The little bit of cortisone steroid medication may be will help her coccyx pain as well.    Meds & Orders:  Meds ordered this encounter  Medications  . lidocaine (PF) (XYLOCAINE) 1 %  injection 2 mL  . methylPREDNISolone acetate (DEPO-MEDROL) injection 80 mg   Orders Placed This Encounter  Procedures  . Facet Injection  . XR Pelvis 1-2 Views  . XR C-ARM NO REPORT    Follow-up: Return if symptoms worsen or fail to improve.   Procedures: No procedures performed  No notes on file   Clinical History: MRI lumbar spine 05/27/2010 L1-2: Moderate disc degeneration with disc space narrowing which has progressed. There is a moderate-to-large lateral disc protrusion on the left extending  lateral to the foramen. There is adjacent soft tissue edema in this area suggesting this is a recent disc protrusion. Expected impingement of the left L1 nerve root. Mild facet degeneration without central canal stenosis.  L2-3: Disc degeneration with disc bulging and mild vertebral spurring. Mild facet degeneration and mild spinal stenosis. No change from the prior study.  L3-4: Mild disc degeneration and facet degeneration. Small central disc protrusion without significant spinal stenosis.  L4-5: Disc degeneration and disc bulging. Moderate to advanced facet degeneration with mild spinal stenosis. This is unchanged from the prior study. Mild foraminal encroachment on the right. Discogenic edema in the endplates on the right is unchanged from the prior study.  L5-S1: Moderate to advanced disc degeneration and spondylosis. Bilateral facet degeneration, unchanged.  IMPRESSION: Moderate to large disc protrusion at L1-2 lateral to the foramen with expected impingement of the left L1 nerve root. This has developed since the prior MRI.  Remaining degenerative changes in the lumbar spine are stable from the prior study.  She reports that she quit smoking about 29 years ago. Her smoking use included Cigarettes. She quit after 18.00 years of use. She has never used smokeless tobacco.  Recent Labs  01/04/17 0952  HGBA1C 5.6    Objective:  VS:  HT:    WT:   BMI:     BP:(!) 143/88  HR:(!) 104bpm  TEMP:97.9 F (36.6 C)(Oral)  RESP:100 % Physical Exam  Constitutional: She is oriented to person, place, and time. She appears well-developed and well-nourished. No distress.  HENT:  Head: Normocephalic and atraumatic.  Nose: Nose normal.  Mouth/Throat: Oropharynx is clear and moist.  Eyes: Conjunctivae are normal. Pupils are equal, round, and reactive to light.  Neck: Normal range of motion. Neck supple.  Cardiovascular: Regular rhythm and intact distal pulses.   Pulmonary/Chest:  Effort normal. No respiratory distress.  Abdominal: She exhibits no distension. There is no guarding.  Musculoskeletal:  No pain with hip rotation she has good distal strength. She still has pain with extension of the lumbar spine. She has some tenderness over the musculature bilaterally around the SI joints.  Neurological: She is alert and oriented to person, place, and time. She exhibits normal muscle tone. Coordination normal.  Skin: Skin is warm. No rash noted. No erythema.  Psychiatric: She has a normal mood and affect. Her behavior is normal.  Nursing note and vitals reviewed.   Ortho Exam Imaging: No results found.  Past Medical/Family/Surgical/Social History: Medications & Allergies reviewed per EMR Patient Active Problem List   Diagnosis Date Noted  . Hyperglycemia 01/03/2017  . DDD (degenerative disc disease), lumbar 01/03/2017  . Localized edema 04/13/2016  . Osteoarthritis of right knee 09/21/2013  . S/P total knee replacement using cement 09/18/2013  . Anisocoria 07/26/2013  . Varicose veins of lower extremities with other complications 84/16/6063  . Paroxysmal Atrial Fibrillation 06/29/2011  . COUGH, CHRONIC 03/09/2010  . HIP PAIN, BILATERAL 07/17/2009  . ALLERGIC RHINITIS 04/23/2008  .  GERD 11/01/2007  . Essential hypertension 09/20/2007  . SKIN CANCER, LEG 07/03/2007  . Hyperlipidemia 07/03/2007  . COLLAGENOUS COLITIS 07/03/2007  . POLIOMYELITIS, HX OF 07/03/2007   Past Medical History:  Diagnosis Date  . Allergic rhinitis    takes Benadryl at bedtime  . Allergy   . Anemia   . Arthritis    osteoarthritis  . Atrial fibrillation ([place]) 06-10-2011   takes Diltiazem and Pradaxa daily  . Cancer of skin of leg   . Cataract    bilateral cataracts removed  . Clotting disorder ([place])    on PRADAXA - chronic a fib  . Collagenous colitis    recurrent, takes Budesonide daily as needed   . Colon polyps 01/14/2010   Hyperplastic  . Cough    states every day of  her life and every chest xray is always clear  . GERD (gastroesophageal reflux disease)    takes Omeprazole daily  . History of poliomyelitis    Polio  age 11- no significant neuromuscular deficit  . Hyperlipidemia    was on Pravastatin but joint pain;has been off for 2-37months ;takes CoQ10  . Hypertension    takes Bisoprolol and Chlorthalidone daily  . Potassium deficiency    takes KDUR daily   Family History  Problem Relation Age of Onset  . Stomach cancer Maternal Grandfather   . Heart disease Paternal Grandfather   . Colon cancer Son   . Rectal cancer Son 58  . Colon cancer Paternal Grandmother   . Alcohol abuse Father   . Heart disease Father   . Stroke Father   . Hypertension Father   . Hypertension Sister   . Other Sister        varicose veins  . Peripheral vascular disease Mother   . Other Mother        varicose veins  . Esophageal cancer Neg Hx   . Pancreatic cancer Neg Hx    Past Surgical History:  Procedure Laterality Date  . APPENDECTOMY     at age 60  . BREAST BIOPSY  1947 & 10/07   Benign  . CARDIOVERSION N/A 11/05/2016   Procedure: CARDIOVERSION;  Surgeon: Sueanne Margarita, MD;  Location: [facility];  Service: Cardiovascular;  Laterality: N/A;  . cataracts removed    . COLONOSCOPY    . ENDOVENOUS ABLATION SAPHENOUS VEIN W/ LASER  03-03-2011 left greater saphenous vein   . ENDOVENOUS ABLATION SAPHENOUS VEIN W/ LASER  01-06-2011  right greater saphenous vein  . epidural infection     x 2   . SKIN CANCER EXCISION     removal of skin cancer on left leg  . TONSILLECTOMY     as a child  . TOTAL KNEE ARTHROPLASTY Right 09/18/2013   Procedure: TOTAL KNEE ARTHROPLASTY;  Surgeon: Garald Balding, MD;  Location: [facility];  Service: Orthopedics;  Laterality: Right;   Social History   Occupational History  . Not on file.   Social History Main Topics  . Smoking status: Former Smoker    Years: 18.00    Types: Cigarettes    Quit date: 08/17/1987  . Smokeless  tobacco: Never Used     Comment: quit smoking 81yrs ago  . Alcohol use 4.2 oz/week    7 Glasses of wine per week     Comment: glass of wine daily  . Drug use: No  . Sexual activity: Yes    Birth control/ protection: Surgical

## 2017-01-24 NOTE — Procedures (Signed)
Lumbar Facet Joint Intra-Articular Injection(s) with Fluoroscopic Guidance  Patient: [name] [name]      Date of Birth: 11-03-1940 MRN: [id number] PCP: Binnie Rail, MD      Visit Date: 01/21/2017   Universal Protocol:    Date/Time: 06/11/186:23 AM  Consent Given By: the patient  Position: PRONE   Additional Comments: Vital signs were monitored before and after the procedure. Patient was prepped and draped in the usual sterile fashion. The correct patient, procedure, and site was verified.   Injection Procedure Details:  Procedure Site One Meds Administered:  Meds ordered this encounter  Medications  . lidocaine (PF) (XYLOCAINE) 1 % injection 2 mL  . methylPREDNISolone acetate (DEPO-MEDROL) injection 80 mg     Laterality: Bilateral  Location/Site:  L4-L5 L5-S1  Needle size: 22 guage  Needle type: Spinal  Needle Placement: Articular  Findings:  -Contrast Used: 1 mL iohexol 180 mg iodine/mL   -Comments: Excellent flow of contrast producing a partial arthrogram.  Procedure Details: The fluoroscope beam is vertically oriented in AP, and the inferior recess is visualized beneath the lower pole of the inferior apophyseal process, which represents the target point for needle insertion. When direct visualization is difficult the target point is located at the medial projection of the vertebral pedicle. The region overlying each aforementioned target is locally anesthetized with a 1 to 2 ml. volume of 1% Lidocaine without Epinephrine.   The spinal needle was inserted into each of the above mentioned facet joints using biplanar fluoroscopic guidance. A 0.25 to 0.5 ml. volume of Isovue-250 was injected and a partial facet joint arthrogram was obtained. A single spot film was obtained of the resulting arthrogram.    One to 1.25 ml of the steroid/anesthetic solution was then injected into each of the facet joints noted above.   Additional Comments:  The patient tolerated the  procedure well Dressing: Band-Aid    Post-procedure details: Patient was observed during the procedure. Post-procedure instructions were reviewed.  Patient left the clinic in stable condition.

## 2017-02-02 ENCOUNTER — Encounter (INDEPENDENT_AMBULATORY_CARE_PROVIDER_SITE_OTHER): Payer: Self-pay | Admitting: Orthopedic Surgery

## 2017-02-02 ENCOUNTER — Ambulatory Visit (INDEPENDENT_AMBULATORY_CARE_PROVIDER_SITE_OTHER): Payer: Medicare Other

## 2017-02-02 ENCOUNTER — Ambulatory Visit (INDEPENDENT_AMBULATORY_CARE_PROVIDER_SITE_OTHER): Payer: Medicare Other | Admitting: Orthopedic Surgery

## 2017-02-02 VITALS — BP 117/65 | HR 84 | Resp 16 | Ht 66.0 in | Wt 158.0 lb

## 2017-02-02 DIAGNOSIS — M545 Low back pain: Secondary | ICD-10-CM

## 2017-02-02 NOTE — Progress Notes (Signed)
Office Visit Note   Patient: [name] [name]           Date of Birth: Dec 27, 1940           MRN: [id number] Visit Date: 02/02/2017              Requested by: Binnie Rail, MD [place], [place] [address] PCP: Binnie Rail, MD   Assessment & Plan: Visit Diagnoses:  1. Midline low back pain, unspecified chronicity, with sciatica presence unspecified     Plan:  #1: Lumbar MRI urgently. If there is a compression fracture she may be a candidate for kyphoplasty to help her pain. #2: Follow back up after MRI  Follow-Up Instructions: No Follow-up on file.   Orders:  Orders Placed This Encounter  Procedures  . XR Lumbar Spine 2-3 Views  . MR Lumbar Spine w/o contrast   No orders of the defined types were placed in this encounter.     Procedures: No procedures performed   Clinical Data: No additional findings.   Subjective: Chief Complaint  Patient presents with  . Lower Back - Pain  . Back Pain    Back pain x 3 days, standing from chair and walking to sink, standing and moving make it worse, pain radiates from back to front on left side, no injury, no back surgery, not diabetic, Dr. Ernestina Patches inj x 1 week lumbar, Tramadol didn't help, cannot take NSAIDS, resting helps, pain with walking, some burning down left leg, no pain when sitting or lying down    Knows very plleasant 76 year old white female who is seen today for evaluation of low back pain. She does state that she had a fall back in the end of May landing onto her buttock. She really did not have much pain at time. She did have an injection by Dr. Ernestina Patches and had told him about that and he did an AP pelvis because the pain was basically in the lumbar spine in the SI area and came into her groin. Apparently there was no fracture noted at that time in the pelvic area. I cannot find an x-ray of her lumbar spine though. About 3 days ago she was standing up from a chair and walking to sink and had pain in the  posterior  aspect of the lumbar spine. When she was standing and moving it made it worse with pain radiating from back to front on left side. Dr. Ernestina Patches inject her about 1 week ago. Tramadol didn't help and she cannot take NSAID. Rest helps but pain with walking, some burning down left leg, no pain when sitting or lying down. Denies any bowel or bladder incontinence.     Review of Systems  Constitutional: Negative.   HENT: Negative.   Respiratory: Negative.   Cardiovascular: Negative.   Gastrointestinal: Negative.   Genitourinary: Negative.   Skin: Negative.   Neurological: Negative.   Hematological: Negative.   Psychiatric/Behavioral: Negative.      Objective: Vital Signs: BP 117/65 (BP Location: Left Arm, Patient Position: Sitting, Cuff Size: Normal)   Pulse 84   Resp 16   Ht 5\' 6"  (1.676 m)   Wt 158 lb (71.7 kg)   BMI 25.50 kg/m   Physical Exam  Constitutional: She is oriented to person, place, and time. She appears well-developed and well-nourished.  HENT:  Head: Normocephalic and atraumatic.  Eyes: EOM are normal. Pupils are equal, round, and reactive to light.  Pulmonary/Chest: Effort normal.  Neurological:  She is alert and oriented to person, place, and time.  Skin: Skin is warm and dry.  Psychiatric: She has a normal mood and affect. Her behavior is normal. Judgment and thought content normal.    Ortho Exam Clinically she does have a kyphoscoliotic deformity of the brachial lumbar spine. She's tender more over the left the sacrum and left lower lumbar spine. She has negative straight leg raising at this time. Hips with good motion and no pain. Deep tendon reflexes 1+ in the knees absent in the ankles. Good strength in the lower extremities bilateral and equal. Sensation is intact to light touch in the lower extremity.  Specialty Comments:  No specialty comments available.  Imaging: Xr Lumbar Spine 2-3 Views  Result Date: 02/02/2017 Two-view x-rays of lumbar spine does  reveal what appears to be a compensatory scoliosis at the thoracolumbar area. Major apexes on the right at L2. Marked facet Changes were noted as especially at the L5 and S1 area more. She does have L5-S1 degenerative disc disease with marked disc space narrowing. L1-L2 appears to be arthrodesis. His anterior spurring L1 and T12. Location the aorta. Questionable L1 compression deformity.    PMFS History: Patient Active Problem List   Diagnosis Date Noted  . Hyperglycemia 01/03/2017  . DDD (degenerative disc disease), lumbar 01/03/2017  . Localized edema 04/13/2016  . Osteoarthritis of right knee 09/21/2013  . S/P total knee replacement using cement 09/18/2013  . Anisocoria 07/26/2013  . Varicose veins of lower extremities with other complications 89/21/1941  . Paroxysmal Atrial Fibrillation 06/29/2011  . COUGH, CHRONIC 03/09/2010  . HIP PAIN, BILATERAL 07/17/2009  . ALLERGIC RHINITIS 04/23/2008  . GERD 11/01/2007  . Essential hypertension 09/20/2007  . SKIN CANCER, LEG 07/03/2007  . Hyperlipidemia 07/03/2007  . COLLAGENOUS COLITIS 07/03/2007  . POLIOMYELITIS, HX OF 07/03/2007   Past Medical History:  Diagnosis Date  . Allergic rhinitis    takes Benadryl at bedtime  . Allergy   . Anemia   . Arthritis    osteoarthritis  . Atrial fibrillation ([place]) 06-10-2011   takes Diltiazem and Pradaxa daily  . Cancer of skin of leg   . Cataract    bilateral cataracts removed  . Clotting disorder ([place])    on PRADAXA - chronic a fib  . Collagenous colitis    recurrent, takes Budesonide daily as needed   . Colon polyps 01/14/2010   Hyperplastic  . Cough    states every day of her life and every chest xray is always clear  . GERD (gastroesophageal reflux disease)    takes Omeprazole daily  . History of poliomyelitis    Polio  age 26- no significant neuromuscular deficit  . Hyperlipidemia    was on Pravastatin but joint pain;has been off for 2-73months ;takes CoQ10  . Hypertension     takes Bisoprolol and Chlorthalidone daily  . Potassium deficiency    takes KDUR daily    Family History  Problem Relation Age of Onset  . Stomach cancer Maternal Grandfather   . Heart disease Paternal Grandfather   . Colon cancer Son   . Rectal cancer Son 85  . Colon cancer Paternal Grandmother   . Alcohol abuse Father   . Heart disease Father   . Stroke Father   . Hypertension Father   . Hypertension Sister   . Other Sister        varicose veins  . Peripheral vascular disease Mother   . Other Mother  varicose veins  . Esophageal cancer Neg Hx   . Pancreatic cancer Neg Hx     Past Surgical History:  Procedure Laterality Date  . APPENDECTOMY     at age 69  . BREAST BIOPSY  1947 & 10/07   Benign  . CARDIOVERSION N/A 11/05/2016   Procedure: CARDIOVERSION;  Surgeon: Sueanne Margarita, MD;  Location: [facility];  Service: Cardiovascular;  Laterality: N/A;  . cataracts removed    . COLONOSCOPY    . ENDOVENOUS ABLATION SAPHENOUS VEIN W/ LASER  03-03-2011 left greater saphenous vein   . ENDOVENOUS ABLATION SAPHENOUS VEIN W/ LASER  01-06-2011  right greater saphenous vein  . epidural infection     x 2   . SKIN CANCER EXCISION     removal of skin cancer on left leg  . TONSILLECTOMY     as a child  . TOTAL KNEE ARTHROPLASTY Right 09/18/2013   Procedure: TOTAL KNEE ARTHROPLASTY;  Surgeon: Garald Balding, MD;  Location: [facility];  Service: Orthopedics;  Laterality: Right;   Social History   Occupational History  . Not on file.   Social History Main Topics  . Smoking status: Former Smoker    Packs/day: 1.00    Years: 18.00    Types: Cigarettes    Quit date: 08/17/1987  . Smokeless tobacco: Never Used     Comment: quit smoking 8yrs ago  . Alcohol use 4.2 oz/week    7 Glasses of wine per week     Comment: glass of wine daily  . Drug use: No  . Sexual activity: Yes    Birth control/ protection: Surgical

## 2017-02-07 ENCOUNTER — Ambulatory Visit: Payer: Medicare Other | Admitting: Cardiology

## 2017-02-12 ENCOUNTER — Ambulatory Visit
Admission: RE | Admit: 2017-02-12 | Discharge: 2017-02-12 | Disposition: A | Discharge status: Home / Self Care | Payer: Medicare Other | Source: Ambulatory Visit | Attending: Orthopedic Surgery | Admitting: Orthopedic Surgery

## 2017-02-12 DIAGNOSIS — M545 Low back pain: Secondary | ICD-10-CM

## 2017-02-17 NOTE — Progress Notes (Signed)
Please schedule appt with Dr Juliet Rude at [facility] radiology to consider injection treatment of her insufficiency fracture, Call pt and relate the findings referable to her sacrum with RX being the injection

## 2017-02-18 ENCOUNTER — Telehealth (INDEPENDENT_AMBULATORY_CARE_PROVIDER_SITE_OTHER): Payer: Self-pay

## 2017-02-18 NOTE — Telephone Encounter (Signed)
Called pt and she will call Dr. Ernestina Patches and schedule an appt.

## 2017-02-21 ENCOUNTER — Telehealth (INDEPENDENT_AMBULATORY_CARE_PROVIDER_SITE_OTHER): Payer: Self-pay | Admitting: Orthopaedic Surgery

## 2017-02-21 ENCOUNTER — Other Ambulatory Visit (INDEPENDENT_AMBULATORY_CARE_PROVIDER_SITE_OTHER): Payer: Self-pay

## 2017-02-21 ENCOUNTER — Telehealth (INDEPENDENT_AMBULATORY_CARE_PROVIDER_SITE_OTHER): Payer: Self-pay | Admitting: Physical Medicine and Rehabilitation

## 2017-02-21 DIAGNOSIS — M5440 Lumbago with sciatica, unspecified side: Secondary | ICD-10-CM

## 2017-02-21 NOTE — Telephone Encounter (Signed)
[place] called GI to schedule

## 2017-02-21 NOTE — Telephone Encounter (Signed)
yes

## 2017-02-21 NOTE — Telephone Encounter (Signed)
Patient tried to schedule appt with Dr. Ernestina Patches, but doesn't know when they will be getting back with her for an appt. Patient in a lot of pain. Wants to know what she can do in the mean time. Patient has had a hard time getting out of bad due to pain. Please call to advise.

## 2017-02-21 NOTE — Telephone Encounter (Signed)
You can bring her in for office visit and potential epidural injection. Please tell her I did review the MRI and is really not a disc problem that she has a sacral insufficiency fracture. These usually heal on their own but can be quite painful. She may be a candidate see somebody for sacral plasty where they put some minutes and there.

## 2017-02-21 NOTE — Telephone Encounter (Signed)
Can I send her to [facility] imaging?

## 2017-02-22 ENCOUNTER — Other Ambulatory Visit (INDEPENDENT_AMBULATORY_CARE_PROVIDER_SITE_OTHER): Payer: Self-pay | Admitting: Orthopaedic Surgery

## 2017-02-22 ENCOUNTER — Telehealth (INDEPENDENT_AMBULATORY_CARE_PROVIDER_SITE_OTHER): Payer: Self-pay

## 2017-02-22 ENCOUNTER — Other Ambulatory Visit (INDEPENDENT_AMBULATORY_CARE_PROVIDER_SITE_OTHER): Payer: Self-pay

## 2017-02-22 DIAGNOSIS — S322XXA Fracture of coccyx, initial encounter for closed fracture: Principal | ICD-10-CM

## 2017-02-22 DIAGNOSIS — S3210XA Unspecified fracture of sacrum, initial encounter for closed fracture: Secondary | ICD-10-CM

## 2017-02-22 DIAGNOSIS — G8929 Other chronic pain: Secondary | ICD-10-CM

## 2017-02-22 DIAGNOSIS — M545 Low back pain: Principal | ICD-10-CM

## 2017-02-22 NOTE — Telephone Encounter (Signed)
Patient left a message this morning wanting to know why she was referred to GI.  She thought that she was referred to Dr. Ernestina Patches but also states that he is pretty backed up as well.  She is wanting to know what she can do in the meantime or can Dr. Durward Fortes call her in something for pain.

## 2017-02-22 NOTE — Telephone Encounter (Signed)
I called Ms. Drier to let her know that PW wants her to go to Dr. Juliet Rude for the insufficiency fracture. She is happy with the plan to proceed with this. I told her Dr. Ernestina Patches does not do these kind of procedures.

## 2017-02-24 ENCOUNTER — Telehealth (HOSPITAL_COMMUNITY): Payer: Self-pay

## 2017-02-24 ENCOUNTER — Other Ambulatory Visit (HOSPITAL_COMMUNITY): Payer: Self-pay | Admitting: Interventional Radiology

## 2017-02-24 ENCOUNTER — Telehealth: Payer: Self-pay

## 2017-02-24 DIAGNOSIS — S322XXA Fracture of coccyx, initial encounter for closed fracture: Principal | ICD-10-CM

## 2017-02-24 DIAGNOSIS — S3210XA Unspecified fracture of sacrum, initial encounter for closed fracture: Secondary | ICD-10-CM

## 2017-02-24 NOTE — Telephone Encounter (Signed)
Mrs. Amodio is very concerned that she does not know anything about the Dr. Estanislado Pandy [facility]) procedure. Please call her to explain and also she is in a lot of pain and would like an rx of hydrocodone 5/325 when you get in. SHe will pick up Friday. [phone]

## 2017-02-24 NOTE — Telephone Encounter (Signed)
Called to schedule consult, left message for pt to return call. AW 

## 2017-02-25 ENCOUNTER — Other Ambulatory Visit (INDEPENDENT_AMBULATORY_CARE_PROVIDER_SITE_OTHER): Payer: Self-pay

## 2017-02-25 MED ORDER — HYDROCODONE-ACETAMINOPHEN 5-325 MG PO TABS: 1.0000 | ORAL_TABLET | Freq: Three times a day (TID) | ORAL | 0 refills | Status: DC | PRN

## 2017-03-07 ENCOUNTER — Ambulatory Visit (HOSPITAL_COMMUNITY)
Admission: RE | Admit: 2017-03-07 | Discharge: 2017-03-07 | Disposition: A | Discharge status: Home / Self Care | Payer: Medicare Other | Source: Ambulatory Visit | Attending: Interventional Radiology | Admitting: Interventional Radiology

## 2017-03-07 DIAGNOSIS — S3210XA Unspecified fracture of sacrum, initial encounter for closed fracture: Secondary | ICD-10-CM

## 2017-03-07 DIAGNOSIS — S322XXA Fracture of coccyx, initial encounter for closed fracture: Principal | ICD-10-CM

## 2017-03-07 HISTORY — PX: IR RADIOLOGIST EVAL & MGMT: IMG5224

## 2017-03-08 ENCOUNTER — Telehealth (INDEPENDENT_AMBULATORY_CARE_PROVIDER_SITE_OTHER): Payer: Self-pay | Admitting: Orthopaedic Surgery

## 2017-03-08 ENCOUNTER — Encounter (HOSPITAL_COMMUNITY): Payer: Self-pay | Admitting: Interventional Radiology

## 2017-03-08 NOTE — Telephone Encounter (Signed)
Angelita Ingles Calling wanting to know if she still needs to schedule patient an appt for an injection. She states it looks as if patient saw Dr. Estanislado Pandy yesterday, and may have already gotten injection. Please call to advise. Angelita Ingles will be there until 3:30, but you can leave a message to schedule or not.

## 2017-03-09 ENCOUNTER — Ambulatory Visit (INDEPENDENT_AMBULATORY_CARE_PROVIDER_SITE_OTHER): Payer: Medicare Other | Admitting: Orthopedic Surgery

## 2017-03-10 NOTE — Telephone Encounter (Signed)
I called [name] [name] at [facility], Dr. Juliet Rude will be treating patient.

## 2017-03-16 NOTE — Progress Notes (Signed)
HPI: FU AFib. Patient had episode of atrial fibrillation in [place] in Oct 2012; treated with cardizem and converted spontaneously. Nuclear study Nov 2012 showed EF 69 and normal perfusion. Echocardiogram November 2017 showed ejection fraction 50-55%, mild left ventricular hypertrophy, mild aortic and mitral regurgitation, mild left atrial enlargement and moderate tricuspid regurgitation. Noted to be in recurrent atrial fibrillation at previous ov. Holter monitor December 2017 showed atrial fibrillation with rate mildly elevated. Patient had successful cardioversion on 11/05/2016. Following cardioversion she was found to be bradycardic and her medications were decreased. When I last saw her in April 2018 she had developed recurrent atrial fibrillation and we elected rate control. Since I last saw her, she fell and may injuring her back. She has had limited mobility since. She denies dyspnea, chest pain or syncope. Occasional flutters at night. No bleeding.   Current Outpatient Prescriptions  Medication Sig Dispense Refill  . bisoprolol (ZEBETA) 10 MG tablet Take 10 mg by mouth daily.    . dabigatran (PRADAXA) 150 MG CAPS capsule Take 1 capsule (150 mg total) by mouth every 12 (twelve) hours. 180 capsule 3  . diltiazem (CARDIZEM CD) 360 MG 24 hr capsule Take 1 capsule (360 mg total) by mouth daily. 90 capsule 3  . Doxylamine Succinate, Sleep, (UNISOM PO) Take by mouth at bedtime as needed.    . ranitidine (ZANTAC) 150 MG tablet Take 1 tablet (150 mg total) by mouth 2 (two) times daily. 180 tablet 3  . spironolactone (ALDACTONE) 25 MG tablet Take 1 tablet (25 mg total) by mouth daily. 90 tablet 3   Current Facility-Administered Medications  Medication Dose Route Frequency Provider Last Rate Last Dose  . lidocaine (PF) (XYLOCAINE) 1 % injection 2 mL  2 mL Other Once Magnus Sinning, MD      . methylPREDNISolone acetate (DEPO-MEDROL) injection 80 mg  80 mg Other Once Magnus Sinning, MD          Past Medical History:  Diagnosis Date  . Allergic rhinitis    takes Benadryl at bedtime  . Allergy   . Anemia   . Arthritis    osteoarthritis  . Atrial fibrillation ([place]) 06-10-2011   takes Diltiazem and Pradaxa daily  . Cancer of skin of leg   . Cataract    bilateral cataracts removed  . Clotting disorder ([place])    on PRADAXA - chronic a fib  . Collagenous colitis    recurrent, takes Budesonide daily as needed   . Colon polyps 01/14/2010   Hyperplastic  . Cough    states every day of her life and every chest xray is always clear  . GERD (gastroesophageal reflux disease)    takes Omeprazole daily  . History of poliomyelitis    Polio  age 9- no significant neuromuscular deficit  . Hyperlipidemia    was on Pravastatin but joint pain;has been off for 2-40months ;takes CoQ10  . Hypertension    takes Bisoprolol and Chlorthalidone daily  . Potassium deficiency    takes [facility] daily    Past Surgical History:  Procedure Laterality Date  . APPENDECTOMY     at age 21  . BREAST BIOPSY  1947 & 10/07   Benign  . CARDIOVERSION N/A 11/05/2016   Procedure: CARDIOVERSION;  Surgeon: Sueanne Margarita, MD;  Location: [facility];  Service: Cardiovascular;  Laterality: N/A;  . cataracts removed    . COLONOSCOPY    . ENDOVENOUS ABLATION SAPHENOUS VEIN W/ LASER  03-03-2011 left greater  saphenous vein   . ENDOVENOUS ABLATION SAPHENOUS VEIN W/ LASER  01-06-2011  right greater saphenous vein  . epidural infection     x 2   . IR RADIOLOGIST EVAL & MGMT  03/07/2017  . SKIN CANCER EXCISION     removal of skin cancer on left leg  . TONSILLECTOMY     as a child  . TOTAL KNEE ARTHROPLASTY Right 09/18/2013   Procedure: TOTAL KNEE ARTHROPLASTY;  Surgeon: Garald Balding, MD;  Location: [facility];  Service: Orthopedics;  Laterality: Right;    Social History   Social History  . Marital status: Married    Spouse name: N/A  . Number of children: N/A  . Years of education: N/A   Occupational  History  . Not on file.   Social History Main Topics  . Smoking status: Former Smoker    Packs/day: 1.00    Years: 18.00    Types: Cigarettes    Quit date: 08/17/1987  . Smokeless tobacco: Never Used     Comment: quit smoking 56yrs ago  . Alcohol use 4.2 oz/week    7 Glasses of wine per week     Comment: glass of wine daily  . Drug use: No  . Sexual activity: Yes    Birth control/ protection: Surgical   Other Topics Concern  . 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 Stomach cancer Maternal Grandfather   . Heart disease Paternal Grandfather   . Colon cancer Son   . Rectal cancer Son 47  . Colon cancer Paternal Grandmother   . Alcohol abuse Father   . Heart disease Father   . Stroke Father   . Hypertension Father   . Hypertension Sister   . Other Sister        varicose veins  . Peripheral vascular disease Mother   . Other Mother        varicose veins  . Esophageal cancer Neg Hx   . Pancreatic cancer Neg Hx     ROS: Some residual back pain but no fevers or chills, productive cough, hemoptysis, dysphasia, odynophagia, melena, hematochezia, dysuria, hematuria, rash, seizure activity, orthopnea, PND, pedal edema, claudication. Remaining systems are negative.  Physical Exam: Well-developed well-nourished in no acute distress.  Skin is warm and dry.  HEENT is normal.  Neck is supple.  Chest is clear to auscultation with normal expansion.  Cardiovascular exam is irregular Abdominal exam nontender or distended. No masses palpated. Extremities show no edema. neuro grossly intact  ECG- Atrial fibrillation at a rate of 89 with no ST changes. personally reviewed  A/P  1 Permanent atrial fibrillation-patient remains in  atrial fibrillation today on examination. However I do not think she is symptomatic. I therefore think we should continue with rate control and anticoagulation. Continue bisoprolol and Cardizem. Continue pradaxa.  2 hypertension-blood pressure is controlled. Continue present medications.  3 hyperlipidemia-patient has been intolerant to statins previously. We will continue with diet.  [name], MD

## 2017-03-17 ENCOUNTER — Ambulatory Visit (INDEPENDENT_AMBULATORY_CARE_PROVIDER_SITE_OTHER): Payer: Medicare Other | Admitting: Cardiology

## 2017-03-17 ENCOUNTER — Encounter: Payer: Self-pay | Admitting: Cardiology

## 2017-03-17 VITALS — BP 124/72 | HR 89 | Ht 66.0 in | Wt 161.0 lb

## 2017-03-17 DIAGNOSIS — I10 Essential (primary) hypertension: Secondary | ICD-10-CM

## 2017-03-17 DIAGNOSIS — I4821 Permanent atrial fibrillation: Secondary | ICD-10-CM

## 2017-03-17 DIAGNOSIS — E7800 Pure hypercholesterolemia, unspecified: Secondary | ICD-10-CM

## 2017-03-17 DIAGNOSIS — I482 Chronic atrial fibrillation: Secondary | ICD-10-CM | POA: Diagnosis not present

## 2017-03-17 NOTE — Patient Instructions (Signed)
Your physician wants you to follow-up in: 6 MONTHS WITH DR CRENSHAW You will receive a reminder letter in the mail two months in advance. If you don't receive a letter, please call our office to schedule the follow-up appointment.   If you need a refill on your cardiac medications before your next appointment, please call your pharmacy.  

## 2017-06-15 ENCOUNTER — Emergency Department (HOSPITAL_COMMUNITY): Payer: Medicare Other

## 2017-06-15 ENCOUNTER — Emergency Department (HOSPITAL_COMMUNITY)
Admission: EM | Admit: 2017-06-15 | Discharge: 2017-06-15 | Disposition: A | Discharge status: Home / Self Care | Payer: Medicare Other | Attending: Emergency Medicine | Admitting: Emergency Medicine

## 2017-06-15 ENCOUNTER — Encounter (HOSPITAL_COMMUNITY): Payer: Self-pay | Admitting: *Deleted

## 2017-06-15 DIAGNOSIS — Z85828 Personal history of other malignant neoplasm of skin: Secondary | ICD-10-CM | POA: Insufficient documentation

## 2017-06-15 DIAGNOSIS — I10 Essential (primary) hypertension: Secondary | ICD-10-CM | POA: Diagnosis not present

## 2017-06-15 DIAGNOSIS — Z87891 Personal history of nicotine dependence: Secondary | ICD-10-CM | POA: Insufficient documentation

## 2017-06-15 DIAGNOSIS — Z96651 Presence of right artificial knee joint: Secondary | ICD-10-CM | POA: Insufficient documentation

## 2017-06-15 DIAGNOSIS — Z79899 Other long term (current) drug therapy: Secondary | ICD-10-CM | POA: Diagnosis not present

## 2017-06-15 DIAGNOSIS — R0789 Other chest pain: Secondary | ICD-10-CM

## 2017-06-15 DIAGNOSIS — R071 Chest pain on breathing: Secondary | ICD-10-CM | POA: Diagnosis present

## 2017-06-15 LAB — URINALYSIS, ROUTINE W REFLEX MICROSCOPIC
Bilirubin Urine: NEGATIVE
Glucose, UA: NEGATIVE mg/dL
Hgb urine dipstick: NEGATIVE
KETONES UR: NEGATIVE mg/dL
Nitrite: NEGATIVE
PROTEIN: NEGATIVE mg/dL
Specific Gravity, Urine: 1.011 (ref 1.005–1.030)
pH: 7 (ref 5.0–8.0)

## 2017-06-15 LAB — BASIC METABOLIC PANEL
Anion gap: 10 (ref 5–15)
BUN: 18 mg/dL (ref 6–20)
CO2: 26 mmol/L (ref 22–32)
CREATININE: 1 mg/dL (ref 0.44–1.00)
Calcium: 9.3 mg/dL (ref 8.9–10.3)
Chloride: 101 mmol/L (ref 101–111)
GFR calc Af Amer: >60 mL/min (ref >60)
GFR, EST NON AFRICAN AMERICAN: 53 mL/min — AB (ref >60)
GLUCOSE: 103 mg/dL — AB (ref 65–99)
POTASSIUM: 3.7 mmol/L (ref 3.5–5.1)
Sodium: 137 mmol/L (ref 135–145)

## 2017-06-15 LAB — CBC WITH DIFFERENTIAL/PLATELET
Basophils Absolute: 0 10*3/uL (ref 0.0–0.1)
Basophils Relative: 0 %
EOS PCT: 1 %
Eosinophils Absolute: 0.1 10*3/uL (ref 0.0–0.7)
HCT: 41.3 % (ref 36.0–46.0)
HEMOGLOBIN: 13.9 g/dL (ref 12.0–15.0)
LYMPHS ABS: 2.7 10*3/uL (ref 0.7–4.0)
LYMPHS PCT: 29 %
MCH: 32.1 pg (ref 26.0–34.0)
MCHC: 33.7 g/dL (ref 30.0–36.0)
MCV: 95.4 fL (ref 78.0–100.0)
Monocytes Absolute: 1.2 10*3/uL — ABNORMAL HIGH (ref 0.1–1.0)
Monocytes Relative: 13 %
NEUTROS PCT: 57 %
Neutro Abs: 5.2 10*3/uL (ref 1.7–7.7)
Platelets: 238 10*3/uL (ref 150–400)
RBC: 4.33 MIL/uL (ref 3.87–5.11)
RDW: 13.1 % (ref 11.5–15.5)
WBC: 9.2 10*3/uL (ref 4.0–10.5)

## 2017-06-15 LAB — LACTIC ACID, PLASMA
Lactic Acid, Venous: 1.6 mmol/L (ref 0.5–1.9)
Lactic Acid, Venous: 1.4 mmol/L (ref 0.5–1.9)

## 2017-06-15 LAB — BRAIN NATRIURETIC PEPTIDE: B Natriuretic Peptide: 525 pg/mL — ABNORMAL HIGH (ref 0.0–100.0)

## 2017-06-15 LAB — D-DIMER, QUANTITATIVE: D-Dimer, Quant: 0.35 ug/mL-FEU (ref 0.00–0.50)

## 2017-06-15 LAB — TROPONIN I: Troponin I: <0.03 ng/mL (ref <0.03)

## 2017-06-15 MED ORDER — NITROGLYCERIN 0.4 MG SL SUBL
0.4000 mg | SUBLINGUAL_TABLET | SUBLINGUAL | Status: DC | PRN
  Filled 2017-06-15: qty 1

## 2017-06-15 MED ORDER — ASPIRIN 81 MG PO CHEW
324.0000 mg | CHEWABLE_TABLET | Freq: Once | ORAL | Status: AC
  Administered 2017-06-15: 324 mg via ORAL
  Filled 2017-06-15: qty 4

## 2017-06-15 NOTE — ED Triage Notes (Signed)
Pt comes in for chest pain starting yesterday. She went to her primary care today and they told her to come here. Pain is located in center of her chest and worsens with deep inspiration. Pt has been running fevers at home as well. Denies any cough.

## 2017-06-15 NOTE — Discharge Instructions (Signed)
Take over the counter tylenol, as directed on packaging, as needed for discomfort.  Apply moist heat or ice to the area(s) of discomfort, for 15 minutes at a time, several times per day for the next few days.  Do not fall asleep on a heating or ice pack.  Call your regular medical doctor tomorrow to schedule a follow up appointment this week.  Return to the Emergency Department immediately if worsening. ° °

## 2017-06-15 NOTE — ED Provider Notes (Signed)
[facility] EMERGENCY DEPARTMENT Provider Note   CSN: [id number] Arrival date & time: 06/15/17  1325     History   Chief Complaint Chief Complaint  Patient presents with  . Chest Pain    HPI [name] [name] is a 76 y.o. female.  HPI Pt was seen at 1340. Per pt, c/o gradual onset and persistence of constant mid-sternal chest "pain" since yesterday afternoon at 1400. Pt describes the CP as "a bubble just sitting there." States she took APAP and "left over amoxicillin" yesterday. The pain continued until she went to bed at 2100. Pt awoke with the same discomfort "but it was a little better." Pain worsens with deep breath. Endorses home temps to "100.6." States she went to her PMD's office, then was sent to the ED for evaluation. Denies cough, no SOB, no palpitations, no abd pain, no N/V/D, no back pain, no sore throat, no rash.    Past Medical History:  Diagnosis Date  . Allergic rhinitis    takes Benadryl at bedtime  . Allergy   . Anemia   . Arthritis    osteoarthritis  . Atrial fibrillation ([place]) 06-10-2011   takes Diltiazem and Pradaxa daily  . Cancer of skin of leg   . Cataract    bilateral cataracts removed  . Clotting disorder ([place])    on PRADAXA - chronic a fib  . Collagenous colitis    recurrent, takes Budesonide daily as needed   . Colon polyps 01/14/2010   Hyperplastic  . Cough    states every day of her life and every chest xray is always clear  . GERD (gastroesophageal reflux disease)    takes Omeprazole daily  . History of poliomyelitis    Polio  age 89- no significant neuromuscular deficit  . Hyperlipidemia    was on Pravastatin but joint pain;has been off for 2-21months ;takes CoQ10  . Hypertension    takes Bisoprolol and Chlorthalidone daily  . Potassium deficiency    takes [facility] daily    Patient Active Problem List   Diagnosis Date Noted  . Hyperglycemia 01/03/2017  . DDD (degenerative disc disease), lumbar 01/03/2017  . Localized edema 04/13/2016    . Osteoarthritis of right knee 09/21/2013  . S/P total knee replacement using cement 09/18/2013  . Anisocoria 07/26/2013  . Varicose veins of lower extremities with other complications 95/04/3266  . Paroxysmal Atrial Fibrillation 06/29/2011  . COUGH, CHRONIC 03/09/2010  . HIP PAIN, BILATERAL 07/17/2009  . ALLERGIC RHINITIS 04/23/2008  . GERD 11/01/2007  . Essential hypertension 09/20/2007  . SKIN CANCER, LEG 07/03/2007  . Hyperlipidemia 07/03/2007  . COLLAGENOUS COLITIS 07/03/2007  . POLIOMYELITIS, HX OF 07/03/2007    Past Surgical History:  Procedure Laterality Date  . APPENDECTOMY     at age 47  . BREAST BIOPSY  1947 & 10/07   Benign  . CARDIOVERSION N/A 11/05/2016   Procedure: CARDIOVERSION;  Surgeon: Sueanne Margarita, MD;  [facility]: [facility];  Service: Cardiovascular;  Laterality: N/A;  . cataracts removed    . COLONOSCOPY    . ENDOVENOUS ABLATION SAPHENOUS VEIN W/ LASER  03-03-2011 left greater saphenous vein   . ENDOVENOUS ABLATION SAPHENOUS VEIN W/ LASER  01-06-2011  right greater saphenous vein  . epidural infection     x 2   . IR RADIOLOGIST EVAL & MGMT  03/07/2017  . SKIN CANCER EXCISION     removal of skin cancer on left leg  . TONSILLECTOMY     as a  child  . TOTAL KNEE ARTHROPLASTY Right 09/18/2013   Procedure: TOTAL KNEE ARTHROPLASTY;  Surgeon: Garald Balding, MD;  Location: [facility];  Service: Orthopedics;  Laterality: Right;    OB History    No data available       Home Medications    Prior to Admission medications   Medication Sig Start Date End Date Taking? Authorizing Provider  bisoprolol (ZEBETA) 10 MG tablet Take 10 mg by mouth daily.    [provider]  dabigatran (PRADAXA) 150 MG CAPS capsule Take 1 capsule (150 mg total) by mouth every 12 (twelve) hours. 09/14/16   Lelon Perla, MD  diltiazem (CARDIZEM CD) 360 MG 24 hr capsule Take 1 capsule (360 mg total) by mouth daily. 12/06/16   Lelon Perla, MD  Doxylamine Succinate,  Sleep, (UNISOM PO) Take by mouth at bedtime as needed.    [provider]  ranitidine (ZANTAC) 150 MG tablet Take 1 tablet (150 mg total) by mouth 2 (two) times daily. 01/03/17   Binnie Rail, MD  spironolactone (ALDACTONE) 25 MG tablet Take 1 tablet (25 mg total) by mouth daily. 09/14/16 12/13/16  Lelon Perla, MD    Family History Family History  Problem Relation Age of Onset  . Stomach cancer Maternal Grandfather   . Heart disease Paternal Grandfather   . Colon cancer Son   . Rectal cancer Son 62  . Colon cancer Paternal Grandmother   . Alcohol abuse Father   . Heart disease Father   . Stroke Father   . Hypertension Father   . Hypertension Sister   . Other Sister        varicose veins  . Peripheral vascular disease Mother   . Other Mother        varicose veins  . Esophageal cancer Neg Hx   . Pancreatic cancer Neg Hx     Social History Social History  Substance Use Topics  . Smoking status: Former Smoker    Packs/day: 1.00    Years: 18.00    Types: Cigarettes    Quit date: 08/17/1987  . Smokeless tobacco: Never Used     Comment: quit smoking 60yrs ago  . Alcohol use 4.2 oz/week    7 Glasses of wine per week     Comment: glass of wine daily     Allergies   Augmentin [amoxicillin-pot clavulanate]; Levaquin [levofloxacin in d5w]; Polysporin [bacitracin-polymyxin b]; Pravastatin; Sulfonamide derivatives; and Erythromycin   Review of Systems Review of Systems ROS: Statement: All systems negative except as marked or noted in the HPI; Constitutional: +fever. Negative for chills. ; ; Eyes: Negative for eye pain, redness and discharge. ; ; ENMT: Negative for ear pain, hoarseness, nasal congestion, sinus pressure and sore throat. ; ; Cardiovascular: +CP. Negative for palpitations, diaphoresis, dyspnea and peripheral edema. ; ; Respiratory: Negative for cough, wheezing and stridor. ; ; Gastrointestinal: Negative for nausea, vomiting, diarrhea, abdominal pain, blood  in stool, hematemesis, jaundice and rectal bleeding. . ; ; Genitourinary: Negative for dysuria, flank pain and hematuria. ; ; Musculoskeletal: Negative for back pain and neck pain. Negative for swelling and trauma.; ; Skin: Negative for pruritus, rash, abrasions, blisters, bruising and skin lesion.; ; Neuro: Negative for headache, lightheadedness and neck stiffness. Negative for weakness, altered level of consciousness, altered mental status, extremity weakness, paresthesias, involuntary movement, seizure and syncope.       Physical Exam Updated Vital Signs BP 127/67 (BP Location: Right Arm)   Pulse (!) 104  Temp 98.2 F (36.8 C) (Oral)   Resp 17   SpO2 95%   Physical Exam 1345: Physical examination:  Nursing notes reviewed; Vital signs and O2 SAT reviewed;  Constitutional: Well developed, Well nourished, Well hydrated, In no acute distress; Head:  Normocephalic, atraumatic; Eyes: EOMI, PERRL, No scleral icterus; ENMT: Mouth and pharynx normal, Mucous membranes moist; Neck: Supple, Full range of motion, No lymphadenopathy; Cardiovascular: Regular rate and rhythm, No gallop; Respiratory: Breath sounds clear & equal bilaterally, No wheezes.  Speaking full sentences with ease, Normal respiratory effort/excursion; Chest: Nontender, Movement normal; Abdomen: Soft, Nontender, Nondistended, Normal bowel sounds; Genitourinary: No CVA tenderness; Extremities: Pulses normal, No tenderness, +1 pedal edema bilat. No calf edema or asymmetry.; Neuro: AA&Ox3, Major CN grossly intact.  Speech clear. No gross focal motor or sensory deficits in extremities.; Skin: Color normal, Warm, Dry.   ED Treatments / Results  Labs (all labs ordered are listed, but only abnormal results are displayed)   EKG  EKG Interpretation  Date/Time:  Wednesday June 15 2017 13:35:05 EDT Ventricular Rate:  86 PR Interval:    QRS Duration: 84 QT Interval:  352 QTC Calculation: 421 R Axis:   105 Text Interpretation:   Atrial fibrillation Paired ventricular premature complexes Right axis deviation Borderline low voltage, extremity leads When compared with ECG of 03/17/2017 No significant change was found Confirmed by Francine Graven [phone]) on 06/15/2017 1:55:19 PM       Radiology   Procedures Procedures (including critical care time)  Medications Ordered in ED Medications  aspirin chewable tablet 324 mg (not administered)  nitroGLYCERIN (NITROSTAT) SL tablet 0.4 mg (not administered)     Initial Impression / Assessment and Plan / ED Course  I have reviewed the triage vital signs and the nursing notes.  Pertinent labs & imaging results that were available during my care of the patient were reviewed by me and considered in my medical decision making (see chart for details).  MDM Reviewed: previous chart, nursing note and vitals Reviewed previous: labs and ECG Interpretation: labs, x-ray and ECG   Results for orders placed or performed during the hospital encounter of 90/24/09  Basic metabolic panel  Result Value Ref Range   Sodium 137 135 - 145 mmol/L   Potassium 3.7 3.5 - 5.1 mmol/L   Chloride 101 101 - 111 mmol/L   CO2 26 22 - 32 mmol/L   Glucose, Bld 103 (H) 65 - 99 mg/dL   BUN 18 6 - 20 mg/dL   Creatinine, Ser 1.00 0.44 - 1.00 mg/dL   Calcium 9.3 8.9 - 10.3 mg/dL   GFR calc non Af Amer 53 (L) >60 mL/min   GFR calc Af Amer >60 >60 mL/min   Anion gap 10 5 - 15  Troponin I  Result Value Ref Range   Troponin I <0.03 <0.03 ng/mL  Lactic acid, plasma  Result Value Ref Range   Lactic Acid, Venous 1.6 0.5 - 1.9 mmol/L  Lactic acid, plasma  Result Value Ref Range   Lactic Acid, Venous 1.4 0.5 - 1.9 mmol/L  CBC with Differential  Result Value Ref Range   WBC 9.2 4.0 - 10.5 K/uL   RBC 4.33 3.87 - 5.11 MIL/uL   Hemoglobin 13.9 12.0 - 15.0 g/dL   HCT 41.3 36.0 - 46.0 %   MCV 95.4 78.0 - 100.0 fL   MCH 32.1 26.0 - 34.0 pg   MCHC 33.7 30.0 - 36.0 g/dL   RDW 13.1 11.5 - 15.5 %  Platelets 238 150 - 400 K/uL   Neutrophils Relative % 57 %   Neutro Abs 5.2 1.7 - 7.7 K/uL   Lymphocytes Relative 29 %   Lymphs Abs 2.7 0.7 - 4.0 K/uL   Monocytes Relative 13 %   Monocytes Absolute 1.2 (H) 0.1 - 1.0 K/uL   Eosinophils Relative 1 %   Eosinophils Absolute 0.1 0.0 - 0.7 K/uL   Basophils Relative 0 %   Basophils Absolute 0.0 0.0 - 0.1 K/uL  D-dimer, quantitative  Result Value Ref Range   D-Dimer, Quant 0.35 0.00 - 0.50 ug/mL-FEU  Urinalysis, Routine w reflex microscopic  Result Value Ref Range   Color, Urine YELLOW YELLOW   APPearance CLEAR CLEAR   Specific Gravity, Urine 1.011 1.005 - 1.030   pH 7.0 5.0 - 8.0   Glucose, UA NEGATIVE NEGATIVE mg/dL   Hgb urine dipstick NEGATIVE NEGATIVE   Bilirubin Urine NEGATIVE NEGATIVE   Ketones, ur NEGATIVE NEGATIVE mg/dL   Protein, ur NEGATIVE NEGATIVE mg/dL   Nitrite NEGATIVE NEGATIVE   Leukocytes, UA TRACE (A) NEGATIVE   RBC / HPF 0-5 0 - 5 RBC/hpf   WBC, UA 0-5 0 - 5 WBC/hpf   Bacteria, UA RARE (A) NONE SEEN   Squamous Epithelial / LPF 0-5 (A) NONE SEEN  Brain natriuretic peptide  Result Value Ref Range   B Natriuretic Peptide 525.0 (H) 0.0 - 100.0 pg/mL  Troponin I  Result Value Ref Range   Troponin I <0.03 <0.03 ng/mL   Dg Chest 2 View Result Date: 06/15/2017 CLINICAL DATA:  Chest pain and fever EXAM: CHEST  2 VIEW COMPARISON:  September 20, 2013 FINDINGS: Lungs are clear. Heart size and pulmonary vascularity are normal. No adenopathy. There is anterior wedging of a lower thoracic vertebral body. No pneumothorax. IMPRESSION: No edema or consolidation. Electronically Signed   By: Lowella Grip III M.D.   On: 06/15/2017 14:41     1800:  Workup reassuring. BNP mildly elevated, but no CHF on CXR. Doubt PE as cause for symptoms with normal d-dimer and low risk [place].  Doubt ACS as cause for symptoms with normal troponin and unchanged EKG from previous after 2 days of constant symptoms. Dx and testing d/w pt and family.   Questions answered.  Verb understanding, agreeable to d/c home with outpt f/u.   Final Clinical Impressions(s) / ED Diagnoses   Final diagnoses:  None    New Prescriptions New Prescriptions   No medications on file     Francine Graven, DO 06/19/17 1339

## 2017-06-15 NOTE — ED Notes (Signed)
Pt ambulated to [facility]

## 2017-06-17 LAB — URINE CULTURE: Culture: NO GROWTH

## 2017-07-18 ENCOUNTER — Telehealth (INDEPENDENT_AMBULATORY_CARE_PROVIDER_SITE_OTHER): Payer: Self-pay | Admitting: Orthopaedic Surgery

## 2017-07-18 NOTE — Telephone Encounter (Signed)
[place] for oral surgeon to prescribe appropriate antibiotic for the gum surgery

## 2017-07-18 NOTE — Telephone Encounter (Signed)
Please advise 

## 2017-07-18 NOTE — Telephone Encounter (Signed)
Patient is 4 years out from TKA, and will be having gum surgery in a couple of weeks. Patient needs to know if she needs pre meds before surgery. Patient uses [facility] in [place]. Please call to inform.

## 2017-07-19 ENCOUNTER — Other Ambulatory Visit (INDEPENDENT_AMBULATORY_CARE_PROVIDER_SITE_OTHER): Payer: Self-pay

## 2017-07-19 MED ORDER — CLINDAMYCIN HCL 300 MG PO CAPS: 600.0000 mg | ORAL_CAPSULE | Freq: Every day | ORAL | 0 refills | Status: DC | PRN

## 2017-07-19 NOTE — Telephone Encounter (Signed)
Oral Surgeon will not prescribe meds. DO we want her to have them?

## 2017-07-19 NOTE — Addendum Note (Signed)
Addended by: Mervyn Skeeters on: 07/19/2017 12:57 PM   Modules accepted: Orders

## 2017-07-19 NOTE — Telephone Encounter (Signed)
Yes, check allergies and follow written protocol-thanks

## 2017-07-20 ENCOUNTER — Ambulatory Visit: Payer: Medicare Other | Admitting: Nurse Practitioner

## 2017-07-20 ENCOUNTER — Encounter: Payer: Self-pay | Admitting: Nurse Practitioner

## 2017-07-20 ENCOUNTER — Telehealth: Payer: Self-pay | Admitting: Cardiology

## 2017-07-20 VITALS — BP 112/60 | HR 56 | Ht 66.0 in | Wt 168.0 lb

## 2017-07-20 DIAGNOSIS — R6881 Early satiety: Secondary | ICD-10-CM | POA: Diagnosis not present

## 2017-07-20 NOTE — Telephone Encounter (Signed)
New message     1. What dental office are you calling from? Dr Quita Skye [facility]  2. What is your office phone and fax number? Fax [phone] [facility] [phone]   3. What type of procedure is the patient having performed? Removal of small lesion in gum  4. What date is procedure scheduled or is the patient there now? December 10th   5. What is your question (ex. Antibiotics prior to procedure, holding medication-we need to know how long dentist wants pt to hold med)?  How long should she hold pradaxa

## 2017-07-20 NOTE — Progress Notes (Signed)
Chief Complaint:  Gets full easy   HPI: Patient is a 76 year old female known to Dr. Silverio Decamp.  here for evaluation of early satiety. I saw her for the same problem in January of this year. Warm and/or greasy foods made her feel uncomfortable. She was scheduled for EGD for further evaluation. Several sessile polyps without stigmata of bleeding were found in the gastric fundus. A 14 mm pedunculated polyp was removed.  Path c/w with hyperplastic polyp. Gastric biopsies for h.pylori were negative.   [name] [name] is back with the same "reaction" to warm food / drinks. Also realized that some meat cause feeling of fullness. Despite the early satiety she has not lost weight, in fact her weight has increased. She is up 10 pounds from June 2018.  She didn't recall being here in Jan with these same sx. Now she wonders if sx may be stress related. 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  She is still caring for her husband who has cancer and dementia. Trying to keep up his nutrition she has also gained weight. She seems overwhelmed with responsibility.  She recently changed from ppi to Zantac because of concern for side effects. Still needs Tums from time to time. Heartburn generally worse in evening.     Past Medical History:  Diagnosis Date  . Allergic rhinitis    takes Benadryl at bedtime  . Allergy   . Anemia   . Arthritis    osteoarthritis  . Atrial fibrillation ([place]) 06-10-2011   takes Diltiazem and Pradaxa daily  . Cancer of skin of leg   . Cataract    bilateral cataracts removed  . Clotting disorder ([place])    on PRADAXA - chronic a fib  . Collagenous colitis    recurrent, takes Budesonide daily as needed   . Colon polyps 01/14/2010   Hyperplastic  . Cough    states every day of her life and every chest xray is always clear  . GERD (gastroesophageal reflux disease)    takes Omeprazole daily  .  History of poliomyelitis    Polio  age 60- no significant neuromuscular deficit  . Hyperlipidemia    was on Pravastatin but joint pain;has been off for 2-66months ;takes CoQ10  . Hypertension    takes Bisoprolol and Chlorthalidone daily  . Potassium deficiency    takes [facility] daily    Patient's surgical history, family medical history, social history, medications and allergies were all reviewed in Epic    Physical Exam: BP 112/60   Pulse (!) 56   Ht 5\' 6"  (1.676 m)   Wt 168 lb (76.2 kg)   BMI 27.12 kg/m   GENERAL: well developed white female in NAD PSYCH: :Pleasant, cooperative, normal affect EENT:  conjunctiva pink, mucous membranes moist, neck supple without masses CARDIAC:  RRR, no murmur heard, 2-3+ BLE edema PULM: Normal respiratory effort, lungs CTA bilaterally, no wheezing ABDOMEN:  Nondistended, soft, nontender. No obvious masses, no hepatomegaly,  normal bowel sounds SKIN:  turgor, no lesions seen Musculoskeletal:  Normal muscle tone, normal strength NEURO: Alert and oriented x 3, no focal neurologic deficits   ASSESSMENT and PLAN:  Pleasant 76 year old female here with recurrent early satiety when eating warm foods/ warm drinks/ fatty foods/ and certain types of meat. EGD for this in Jan showed hyperplastic polyps. She has no abdominal pain. No nausea.  I don't have an answer  for this "reaction" as she refers to it but exam and absence of weight loss are reassuring.   -Reassurance provided. Will obtain some basic labs, she hasn't had any in several months. Will call her with results. She will notify us for development of any new or worsening sx.   I spent 25 minutes of face-to-face time with the patient. Greater than 50% of the time was spent counseling and coordinating care. Questions answered   Tye Savoy , NP 07/20/2017, 10:18 AM

## 2017-07-20 NOTE — Telephone Encounter (Signed)
   Primary Cardiologist: No primary care provider on file.  Chart reviewed as part of pre-operative protocol coverage. Given past medical history and time since last visit, based on ACC/AHA guidelines, [name] [name] would be at acceptable risk for the planned procedure without further cardiovascular testing.   I will route this recommendation to the requesting party via Epic fax function and remove from pre-op pool.  Please call with questions.  Kerin Ransom, PA-C 07/20/2017, 4:53 PM   Ok to hold Pradaxa if needed, resume ASAP. No need for SBE prophylaxis.   Kerin Ransom PA-C 07/20/2017 4:54 PM

## 2017-07-20 NOTE — Patient Instructions (Signed)
If you are age 76 or older, your body mass index should be between 23-30. Your Body mass index is 27.12 kg/m. If this is out of the aforementioned range listed, please consider follow up with your Primary Care Provider.  If you are age 32 or younger, your body mass index should be between 19-25. Your Body mass index is 27.12 kg/m. If this is out of the aformentioned range listed, please consider follow up with your Primary Care Provider.   Follow up as needed.  Thank you for choosing me and [facility].   Tye Savoy, NP

## 2017-07-22 NOTE — Progress Notes (Signed)
Reviewed and agree with documentation and assessment and plan. K. Veena Clovis Mankins , MD   

## 2017-07-24 ENCOUNTER — Other Ambulatory Visit: Payer: Self-pay

## 2017-07-24 ENCOUNTER — Emergency Department (HOSPITAL_COMMUNITY)
Admission: EM | Admit: 2017-07-24 | Discharge: 2017-07-25 | Disposition: A | Discharge status: Home / Self Care | Payer: Medicare Other | Attending: Emergency Medicine | Admitting: Emergency Medicine

## 2017-07-24 ENCOUNTER — Encounter (HOSPITAL_COMMUNITY): Payer: Self-pay | Admitting: Emergency Medicine

## 2017-07-24 ENCOUNTER — Emergency Department (HOSPITAL_COMMUNITY): Payer: Medicare Other

## 2017-07-24 DIAGNOSIS — R39198 Other difficulties with micturition: Secondary | ICD-10-CM | POA: Insufficient documentation

## 2017-07-24 DIAGNOSIS — Z85828 Personal history of other malignant neoplasm of skin: Secondary | ICD-10-CM | POA: Insufficient documentation

## 2017-07-24 DIAGNOSIS — Z87891 Personal history of nicotine dependence: Secondary | ICD-10-CM | POA: Insufficient documentation

## 2017-07-24 DIAGNOSIS — K6289 Other specified diseases of anus and rectum: Secondary | ICD-10-CM | POA: Diagnosis present

## 2017-07-24 DIAGNOSIS — Z7901 Long term (current) use of anticoagulants: Secondary | ICD-10-CM | POA: Diagnosis not present

## 2017-07-24 DIAGNOSIS — I10 Essential (primary) hypertension: Secondary | ICD-10-CM | POA: Insufficient documentation

## 2017-07-24 DIAGNOSIS — Z96651 Presence of right artificial knee joint: Secondary | ICD-10-CM | POA: Insufficient documentation

## 2017-07-24 LAB — COMPREHENSIVE METABOLIC PANEL
ALT: 14 U/L (ref 14–54)
AST: 22 U/L (ref 15–41)
Albumin: 4.4 g/dL (ref 3.5–5.0)
Alkaline Phosphatase: 80 U/L (ref 38–126)
Anion gap: 10 (ref 5–15)
BUN: 25 mg/dL — ABNORMAL HIGH (ref 6–20)
CO2: 24 mmol/L (ref 22–32)
Calcium: 9.6 mg/dL (ref 8.9–10.3)
Chloride: 101 mmol/L (ref 101–111)
Creatinine, Ser: 1.02 mg/dL — ABNORMAL HIGH (ref 0.44–1.00)
GFR calc Af Amer: >60 mL/min (ref >60)
GFR calc non Af Amer: 52 mL/min — ABNORMAL LOW (ref >60)
Glucose, Bld: 105 mg/dL — ABNORMAL HIGH (ref 65–99)
Potassium: 4.2 mmol/L (ref 3.5–5.1)
Sodium: 135 mmol/L (ref 135–145)
Total Bilirubin: 1.2 mg/dL (ref 0.3–1.2)
Total Protein: 7.5 g/dL (ref 6.5–8.1)

## 2017-07-24 LAB — CBC WITH DIFFERENTIAL/PLATELET
Basophils Absolute: 0 10*3/uL (ref 0.0–0.1)
Basophils Relative: 0 %
Eosinophils Absolute: 0 10*3/uL (ref 0.0–0.7)
Eosinophils Relative: 0 %
HCT: 43.9 % (ref 36.0–46.0)
Hemoglobin: 14.4 g/dL (ref 12.0–15.0)
Lymphocytes Relative: 8 %
Lymphs Abs: 1.7 10*3/uL (ref 0.7–4.0)
MCH: 32.5 pg (ref 26.0–34.0)
MCHC: 32.8 g/dL (ref 30.0–36.0)
MCV: 99.1 fL (ref 78.0–100.0)
Monocytes Absolute: 0.6 10*3/uL (ref 0.1–1.0)
Monocytes Relative: 3 %
Neutro Abs: 17.7 10*3/uL — ABNORMAL HIGH (ref 1.7–7.7)
Neutrophils Relative %: 89 %
Platelets: 255 10*3/uL (ref 150–400)
RBC: 4.43 MIL/uL (ref 3.87–5.11)
RDW: 13.8 % (ref 11.5–15.5)
WBC: 20 10*3/uL — ABNORMAL HIGH (ref 4.0–10.5)

## 2017-07-24 LAB — URINALYSIS, ROUTINE W REFLEX MICROSCOPIC
Bilirubin Urine: NEGATIVE
Glucose, UA: NEGATIVE mg/dL
Hgb urine dipstick: NEGATIVE
Ketones, ur: 20 mg/dL — AB
Leukocytes, UA: NEGATIVE
Nitrite: NEGATIVE
Protein, ur: NEGATIVE mg/dL
Specific Gravity, Urine: 1.012 (ref 1.005–1.030)
pH: 6 (ref 5.0–8.0)

## 2017-07-24 MED ORDER — DEXTROSE 5 % IV SOLN
1.0000 g | Freq: Once | INTRAVENOUS | Status: AC
  Administered 2017-07-24: 1 g via INTRAVENOUS
  Filled 2017-07-24: qty 10

## 2017-07-24 MED ORDER — IOPAMIDOL (ISOVUE-300) INJECTION 61%
100.0000 mL | Freq: Once | INTRAVENOUS | Status: AC | PRN
  Administered 2017-07-24: 100 mL via INTRAVENOUS

## 2017-07-24 MED ORDER — METRONIDAZOLE IN NACL 5-0.79 MG/ML-% IV SOLN
500.0000 mg | Freq: Once | INTRAVENOUS | Status: AC
  Administered 2017-07-24: 500 mg via INTRAVENOUS
  Filled 2017-07-24: qty 100

## 2017-07-24 MED ORDER — DILTIAZEM HCL ER COATED BEADS 360 MG PO CP24
360.0000 mg | ORAL_CAPSULE | Freq: Every day | ORAL | Status: DC
  Administered 2017-07-24: 360 mg via ORAL
  Filled 2017-07-24 (×2): qty 1
  Filled 2017-07-24: qty 2

## 2017-07-24 MED ORDER — CEPHALEXIN 500 MG PO CAPS: 500.0000 mg | ORAL_CAPSULE | Freq: Three times a day (TID) | ORAL | 0 refills | Status: DC

## 2017-07-24 MED ORDER — METRONIDAZOLE 500 MG PO TABS: 500.0000 mg | ORAL_TABLET | Freq: Three times a day (TID) | ORAL | 0 refills | Status: DC

## 2017-07-24 MED ORDER — SODIUM CHLORIDE 0.9 % IV SOLN
INTRAVENOUS | Status: DC
  Administered 2017-07-24: 17:00:00 via INTRAVENOUS

## 2017-07-24 NOTE — ED Provider Notes (Signed)
[facility] EMERGENCY DEPARTMENT Provider Note   CSN: [id number] Arrival date & time: 07/24/17  1542     History   Chief Complaint Chief Complaint  Patient presents with  . Diarrhea    HPI [name] [name] is a 76 y.o. female.  HPI   76 year old female with rectal pain/pressure.  Onset about a day ago.  Persistent since then.  Sensation that she needs to use the bathroom but only going small amounts of watery stool.  She feels like she needs to strain.  Small amount of blood.  No black stool.  No fevers or chills.  She reports difficulty with urination since late this morning.  Past Medical History:  Diagnosis Date  . Allergic rhinitis    takes Benadryl at bedtime  . Allergy   . Anemia   . Arthritis    osteoarthritis  . Atrial fibrillation ([place]) 06-10-2011   takes Diltiazem and Pradaxa daily  . Cancer of skin of leg   . Cataract    bilateral cataracts removed  . Clotting disorder ([place])    on PRADAXA - chronic a fib  . Collagenous colitis    recurrent, takes Budesonide daily as needed   . Colon polyps 01/14/2010   Hyperplastic  . Cough    states every day of her life and every chest xray is always clear  . GERD (gastroesophageal reflux disease)    takes Omeprazole daily  . History of poliomyelitis    Polio  age 76- no significant neuromuscular deficit  . Hyperlipidemia    was on Pravastatin but joint pain;has been off for 2-4months ;takes CoQ10  . Hypertension    takes Bisoprolol and Chlorthalidone daily  . Potassium deficiency    takes [facility] daily    Patient Active Problem List   Diagnosis Date Noted  . Hyperglycemia 01/03/2017  . DDD (degenerative disc disease), lumbar 01/03/2017  . Localized edema 04/13/2016  . Osteoarthritis of right knee 09/21/2013  . S/P total knee replacement using cement 09/18/2013  . Anisocoria 07/26/2013  . Varicose veins of lower extremities with other complications 50/53/9767  . Paroxysmal Atrial Fibrillation 06/29/2011  . COUGH,  CHRONIC 03/09/2010  . HIP PAIN, BILATERAL 07/17/2009  . ALLERGIC RHINITIS 04/23/2008  . GERD 11/01/2007  . Essential hypertension 09/20/2007  . SKIN CANCER, LEG 07/03/2007  . Hyperlipidemia 07/03/2007  . COLLAGENOUS COLITIS 07/03/2007  . POLIOMYELITIS, HX OF 07/03/2007    Past Surgical History:  Procedure Laterality Date  . APPENDECTOMY     at age 58  . BREAST BIOPSY  1947 & 10/07   Benign  . CARDIOVERSION N/A 11/05/2016   Procedure: CARDIOVERSION;  Surgeon: Sueanne Margarita, MD;  Location: [facility];  Service: Cardiovascular;  Laterality: N/A;  . cataracts removed    . COLONOSCOPY    . ENDOVENOUS ABLATION SAPHENOUS VEIN W/ LASER  03-03-2011 left greater saphenous vein   . ENDOVENOUS ABLATION SAPHENOUS VEIN W/ LASER  01-06-2011  right greater saphenous vein  . epidural infection     x 2   . IR RADIOLOGIST EVAL & MGMT  03/07/2017  . SKIN CANCER EXCISION     removal of skin cancer on left leg  . TONSILLECTOMY     as a child  . TOTAL KNEE ARTHROPLASTY Right 09/18/2013   Procedure: TOTAL KNEE ARTHROPLASTY;  Surgeon: Garald Balding, MD;  Location: [facility];  Service: Orthopedics;  Laterality: Right;    OB History    No data available  Home Medications    Prior to Admission medications   Medication Sig Start Date End Date Taking? Authorizing Provider  BIOTIN PO Take 1 tablet by mouth daily.   Yes [provider]  bisoprolol (ZEBETA) 10 MG tablet Take 10 mg by mouth daily.   Yes [provider]  clindamycin (CLEOCIN) 300 MG capsule Take 2 capsules (600 mg total) by mouth daily as needed. PRN dental procedure 07/19/17  Yes Garald Balding, MD  dabigatran (PRADAXA) 150 MG CAPS capsule Take 1 capsule (150 mg total) by mouth every 12 (twelve) hours. 09/14/16  Yes Lelon Perla, MD  diltiazem (CARDIZEM CD) 360 MG 24 hr capsule Take 1 capsule (360 mg total) by mouth daily. 12/06/16  Yes Lelon Perla, MD  Doxylamine Succinate, Sleep, (UNISOM PO) Take by  mouth at bedtime as needed.   Yes [provider]  ranitidine (ZANTAC) 150 MG tablet Take 1 tablet (150 mg total) by mouth 2 (two) times daily. 01/03/17  Yes Burns, Claudina Lick, MD  spironolactone (ALDACTONE) 25 MG tablet Take 1 tablet (25 mg total) by mouth daily. 09/14/16 07/24/17 Yes Crenshaw, Denice Bors, MD    Family History Family History  Problem Relation Age of Onset  . Stomach cancer Maternal Grandfather   . Heart disease Paternal Grandfather   . Colon cancer Son   . Rectal cancer Son 49  . Colon cancer Paternal Grandmother   . Alcohol abuse Father   . Heart disease Father   . Stroke Father   . Hypertension Father   . Hypertension Sister   . Other Sister        varicose veins  . Peripheral vascular disease Mother   . Other Mother        varicose veins  . Esophageal cancer Neg Hx   . Pancreatic cancer Neg Hx     Social History Social History   Tobacco Use  . Smoking status: Former Smoker    Packs/day: 1.00    Years: 18.00    Pack years: 18.00    Types: Cigarettes    Last attempt to quit: 08/17/1987    Years since quitting: 29.9  . Smokeless tobacco: Never Used  . Tobacco comment: quit smoking 87yrs ago  Substance Use Topics  . Alcohol use: Yes    Alcohol/week: 4.2 oz    Types: 7 Glasses of wine per week    Comment: glass of wine daily  . Drug use: No     Allergies   Augmentin [amoxicillin-pot clavulanate]; Levaquin [levofloxacin in d5w]; Polysporin [bacitracin-polymyxin b]; Pravastatin; Sulfonamide derivatives; and Erythromycin   Review of Systems Review of Systems  All systems reviewed and negative, other than as noted in HPI.  Physical Exam Updated Vital Signs BP 109/61 (BP Location: Left Arm)   Pulse (!) 126   Temp 99.6 F (37.6 C) (Oral)   Resp (!) 22   Ht 5\' 6"  (1.676 m)   Wt 76.2 kg (168 lb)   SpO2 94%   BMI 27.12 kg/m   Physical Exam  Constitutional: She appears well-developed and well-nourished. No distress.  HENT:  Head:  Normocephalic and atraumatic.  Eyes: Conjunctivae are normal. Right eye exhibits no discharge. Left eye exhibits no discharge.  Neck: Neck supple.  Cardiovascular: Normal rate, regular rhythm and normal heart sounds. Exam reveals no gallop and no friction rub.  No murmur heard. Pulmonary/Chest: Effort normal and breath sounds normal. No respiratory distress.  Abdominal: Soft. She exhibits no distension. There is tenderness.  Tenderness suprapubically into the left lower quadrant without rebound or guarding.  No distention.  Musculoskeletal: She exhibits no edema or tenderness.  Neurological: She is alert.  Skin: Skin is warm and dry.  Psychiatric: She has a normal mood and affect. Her behavior is normal. Thought content normal.  Nursing note and vitals reviewed.    ED Treatments / Results  Labs (all labs ordered are listed, but only abnormal results are displayed) Labs Reviewed  CBC WITH DIFFERENTIAL/PLATELET - Abnormal; Notable for the following components:      Result Value   WBC 20.0 (*)    Neutro Abs 17.7 (*)    All other components within normal limits  URINALYSIS, ROUTINE W REFLEX MICROSCOPIC - Abnormal; Notable for the following components:   Ketones, ur 20 (*)    All other components within normal limits  COMPREHENSIVE METABOLIC PANEL - Abnormal; Notable for the following components:   Glucose, Bld 105 (*)    BUN 25 (*)    Creatinine, Ser 1.02 (*)    GFR calc non Af Amer 52 (*)    All other components within normal limits    EKG  EKG Interpretation None       Radiology Ct Abdomen Pelvis W Contrast  Result Date: 07/24/2017 CLINICAL DATA:  Constipation, with stool leakage. Unable to urinate. EXAM: CT ABDOMEN AND PELVIS WITH CONTRAST TECHNIQUE: Multidetector CT imaging of the abdomen and pelvis was performed using the standard protocol following bolus administration of intravenous contrast. CONTRAST:  184mL ISOVUE-300 IOPAMIDOL (ISOVUE-300) INJECTION 61% COMPARISON:   None. FINDINGS: LOWER CHEST: RIGHT lung base dependent atelectasis. The heart is mildly enlarged. No pericardial effusion. Mild gas distended distal esophagus associated with reflux. HEPATOBILIARY: Normal liver. Multiple normal folds in gallbladder, nonacute. PANCREAS: Mild focal fatty infiltration pancreatic head.  Nonacute. SPLEEN: Normal. ADRENALS/URINARY TRACT: Kidneys are orthotopic, demonstrating symmetric enhancement. No nephrolithiasis, hydronephrosis or solid renal masses. Too small to characterize hypodensities in the kidneys bilaterally. 9 mm RIGHT lower pole cyst. The unopacified ureters are normal in course and caliber. Delayed imaging through the kidneys demonstrates symmetric prompt contrast excretion within the proximal urinary collecting system. Urinary bladder is partially distended and unremarkable. Normal adrenal glands. STOMACH/BOWEL: Mild circumferential rectal wall thickening with perirectal fat stranding extending to presacral space. Mild amount are The stomach, small bowel are normal in course and caliber without inflammatory changes. The appendix is not discretely identified, however there are no inflammatory changes in the right lower quadrant. VASCULAR/LYMPHATIC: Aortoiliac vessels are normal in course and caliber. Moderate calcific atherosclerosis. No lymphadenopathy by CT size criteria. REPRODUCTIVE: Normal. OTHER: Trace free fluid in the pelvis. No focal fluid collections. No intraperitoneal free air. Small fat containing periumbilical hernia. MUSCULOSKELETAL: Nonacute. Mild degenerative change of the hips. S-type scoliosis with multilevel severe lumbar spondylosis. Old sacral insufficiency fracture better demonstrated on prior MRI. IMPRESSION: 1. Proctitis with mild stool distended rectum. No bowel obstruction. 2. Partially distended bladder, no obstructive uropathy. Aortic Atherosclerosis (ICD10-I70.0). Electronically Signed   By: Elon Alas M.D.   On: 07/24/2017 17:50     Procedures Procedures (including critical care time)  Medications Ordered in ED Medications  0.9 %  sodium chloride infusion ( Intravenous New Bag/Given 07/24/17 1631)  diltiazem (CARDIZEM CD) 24 hr capsule 360 mg (not administered)  iopamidol (ISOVUE-300) 61 % injection 100 mL (100 mLs Intravenous Contrast Given 07/24/17 1723)  metroNIDAZOLE (FLAGYL) IVPB 500 mg (0 mg Intravenous Stopped 07/24/17 2004)  cefTRIAXone (ROCEPHIN) 1 g in dextrose 5 % 50  mL IVPB (0 g Intravenous Stopped 07/24/17 1902)     Initial Impression / Assessment and Plan / ED Course  I have reviewed the triage vital signs and the nursing notes.  Pertinent labs & imaging results that were available during my care of the patient were reviewed by me and considered in my medical decision making (see chart for details).     Proctitis. Urinating herself now. Staying in ED overnight because of weather. DC in am with abx and outpt FU.   Final Clinical Impressions(s) / ED Diagnoses   Final diagnoses:  Proctitis    ED Discharge Orders    None       Virgel Manifold, MD 07/26/17 [phone]

## 2017-07-24 NOTE — ED Notes (Signed)
Pt assisted to the restroom
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
## 2017-07-24 NOTE — ED Notes (Signed)
Bladder scanned pt. Greater than 400cc noted.

## 2017-07-24 NOTE — ED Notes (Signed)
Patient's pulse range 110 to 130. Patient states that she did not take her Cardizem today and she is suppose to take her blood thinner at 0930 and 2130 daily.

## 2017-07-24 NOTE — ED Triage Notes (Signed)
Pt complaining of diarrhea and not being able to urinate since this am.

## 2017-07-25 NOTE — ED Notes (Signed)
Pt alert & oriented x4, stable gait. Patient  given discharge instructions, paperwork & prescription(s). Patient verbalized understanding. Pt left department w/ no further questions. 

## 2017-07-25 NOTE — ED Notes (Signed)
Pt resting calmly w/ eyes closed. Rise & fall of the chest noted. Family at bedside. Bed in low position, side rails up x2. NAD noted at this time.

## 2017-07-27 NOTE — Telephone Encounter (Signed)
   Primary Cardiologist: Dr. Stanford Breed  The patient has already been cleared to proceed with surgery. Discussed holding Pradaxa with Pharmacy. Given her normal renal function, there is no indication Pradaxa needs to be held for 5 days prior to her procedure. Can hold for 48 hours prior to the procedure with no indication for Lovenox bridging.  I will route this recommendation to the requesting party via Epic fax function and remove from pre-op pool.  Please call with questions.  Erma Heritage, PA-C 07/27/2017, 2:26 PM

## 2017-07-27 NOTE — Telephone Encounter (Signed)
Please fax if pt can hold her Pradaxa five days prior to procedure. Please fax to [phone]

## 2017-07-28 ENCOUNTER — Telehealth: Payer: Self-pay | Admitting: Nurse Practitioner

## 2017-07-28 NOTE — Telephone Encounter (Signed)
Patient states since Sunday she has had constipation and her rectum is swollen. Patient states she went to [facility] for a different reason, but the doctor there told the patient she had proctitis. Patient was giving an antibiotic but she is still having gi symptoms. Patient would like a call to discuss how is can help herself.

## 2017-07-28 NOTE — Telephone Encounter (Signed)
The patient reports she has had diarrhea and urgency since she took the atb's prescribed at the ED. One of the atb's is Flagyl. She has stopped both of them. Today she has semi formed stools, but continues with the urgency. She is asking if she should take something for the diarrhea. Recommend she hold off on any anti-diarrheals since she is improvong. No blood in the bowel movements. Not as uncomfortable. It does not feel like colitis as she has experienced in the past. Agrees to call with an update in a few days or sooner if she acutely worsens.

## 2017-07-29 NOTE — Telephone Encounter (Signed)
Voicemail. Left a message to call with her progress.

## 2017-07-29 NOTE — Telephone Encounter (Signed)
She can try benefiber 1 tablespoon BID with meals to see if has any improvement

## 2017-07-29 NOTE — Telephone Encounter (Signed)
Patient leaves me a message reporting she is improving. "Not nearly as uncomfortable" and " not having any leaking of her stool. Stools are firming up."

## 2017-09-28 NOTE — Progress Notes (Deleted)
HPI: FU AFib. Patient had episode of atrial fibrillationin [place] in Oct 2012; treated with cardizem and converted spontaneously. Nuclear studyNov 2012 showed EF 69 and normal perfusion. Echocardiogram November 2017 showed ejection fraction 50-55%, mild left ventricular hypertrophy, mild aortic and mitral regurgitation, mild left atrial enlargement and moderate tricuspid regurgitation. Noted to be in recurrent atrial fibrillation at previous ov. Holter monitor December 2017 showed atrial fibrillation with rate mildly elevated. Patient had successful cardioversion on 11/05/2016. Following cardioversion she was found to be bradycardic and her medications were decreased. When I last saw her in April 2018 she had developed recurrent atrial fibrillation and we elected rate control. Since I last saw her,   Current Outpatient Medications  Medication Sig Dispense Refill  . BIOTIN PO Take 1 tablet by mouth daily.    . bisoprolol (ZEBETA) 10 MG tablet Take 10 mg by mouth daily.    . cephALEXin (KEFLEX) 500 MG capsule Take 1 capsule (500 mg total) by mouth 3 (three) times daily. 21 capsule 0  . clindamycin (CLEOCIN) 300 MG capsule Take 2 capsules (600 mg total) by mouth daily as needed. PRN dental procedure 12 capsule 0  . dabigatran (PRADAXA) 150 MG CAPS capsule Take 1 capsule (150 mg total) by mouth every 12 (twelve) hours. 180 capsule 3  . diltiazem (CARDIZEM CD) 360 MG 24 hr capsule Take 1 capsule (360 mg total) by mouth daily. 90 capsule 3  . Doxylamine Succinate, Sleep, (UNISOM PO) Take by mouth at bedtime as needed.    . metroNIDAZOLE (FLAGYL) 500 MG tablet Take 1 tablet (500 mg total) by mouth 3 (three) times daily. 21 tablet 0  . ranitidine (ZANTAC) 150 MG tablet Take 1 tablet (150 mg total) by mouth 2 (two) times daily. 180 tablet 3  . spironolactone (ALDACTONE) 25 MG tablet Take 1 tablet (25 mg total) by mouth daily. 90 tablet 3   Current Facility-Administered Medications  Medication Dose  Route Frequency Provider Last Rate Last Dose  . lidocaine (PF) (XYLOCAINE) 1 % injection 2 mL  2 mL Other Once Magnus Sinning, MD      . methylPREDNISolone acetate (DEPO-MEDROL) injection 80 mg  80 mg Other Once Magnus Sinning, MD         Past Medical History:  Diagnosis Date  . Allergic rhinitis    takes Benadryl at bedtime  . Allergy   . Anemia   . Arthritis    osteoarthritis  . Atrial fibrillation ([place]) 06-10-2011   takes Diltiazem and Pradaxa daily  . Cancer of skin of leg   . Cataract    bilateral cataracts removed  . Clotting disorder ([place])    on PRADAXA - chronic a fib  . Collagenous colitis    recurrent, takes Budesonide daily as needed   . Colon polyps 01/14/2010   Hyperplastic  . Cough    states every day of her life and every chest xray is always clear  . GERD (gastroesophageal reflux disease)    takes Omeprazole daily  . History of poliomyelitis    Polio  age 47- no significant neuromuscular deficit  . Hyperlipidemia    was on Pravastatin but joint pain;has been off for 2-33months ;takes CoQ10  . Hypertension    takes Bisoprolol and Chlorthalidone daily  . Potassium deficiency    takes KDUR daily    Past Surgical History:  Procedure Laterality Date  . APPENDECTOMY     at age 71  . BREAST BIOPSY  1947 &  10/07   Benign  . CARDIOVERSION N/A 11/05/2016   Procedure: CARDIOVERSION;  Surgeon: Sueanne Margarita, MD;  Location: [facility];  Service: Cardiovascular;  Laterality: N/A;  . cataracts removed    . COLONOSCOPY    . ENDOVENOUS ABLATION SAPHENOUS VEIN W/ LASER  03-03-2011 left greater saphenous vein   . ENDOVENOUS ABLATION SAPHENOUS VEIN W/ LASER  01-06-2011  right greater saphenous vein  . epidural infection     x 2   . IR RADIOLOGIST EVAL & MGMT  03/07/2017  . SKIN CANCER EXCISION     removal of skin cancer on left leg  . TONSILLECTOMY     as a child  . TOTAL KNEE ARTHROPLASTY Right 09/18/2013   Procedure: TOTAL KNEE ARTHROPLASTY;  Surgeon: Garald Balding, MD;  Location: [facility];  Service: Orthopedics;  Laterality: Right;    Social History   Socioeconomic History  . Marital status: Married    Spouse name: Not on file  . Number of children: Not on file  . Years of education: Not on file  . Highest education level: Not on file  Social Needs  . Financial resource strain: Not on file  . Food insecurity - worry: Not on file  . Food insecurity - inability: Not on file  . Transportation needs - medical: Not on file  . Transportation needs - non-medical: Not on file  Occupational History  . Not on file  Tobacco Use  . Smoking status: Former Smoker    Packs/day: 1.00    Years: 18.00    Pack years: 18.00    Types: Cigarettes    Last attempt to quit: 08/17/1987    Years since quitting: 30.1  . Smokeless tobacco: Never Used  . Tobacco comment: quit smoking 70yrs ago  Substance and Sexual Activity  . Alcohol use: Yes    Alcohol/week: 4.2 oz    Types: 7 Glasses of wine per week    Comment: glass of wine daily  . Drug use: No  . Sexual activity: Yes    Partners: Male    Birth control/protection: Surgical  Other Topics Concern  . 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 Stomach cancer Maternal Grandfather   . Heart disease Paternal Grandfather   . Colon cancer Son   . Rectal cancer Son 74  . Colon cancer Paternal Grandmother   . Alcohol abuse Father   . Heart disease Father   . Stroke Father   . Hypertension Father   . Hypertension Sister   . Other Sister        varicose veins  . Peripheral vascular disease Mother   . Other Mother        varicose veins  . Esophageal cancer Neg Hx   . Pancreatic cancer Neg Hx     ROS: no fevers or chills, productive cough,  hemoptysis, dysphasia, odynophagia, melena, hematochezia, dysuria, hematuria, rash, seizure activity, orthopnea, PND, pedal edema, claudication. Remaining systems are negative.  Physical Exam: Well-developed well-nourished in no acute distress.  Skin is warm and dry.  HEENT is normal.  Neck is supple.  Chest is clear  to auscultation with normal expansion.  Cardiovascular exam is regular rate and rhythm.  Abdominal exam nontender or distended. No masses palpated. Extremities show no edema. neuro grossly intact  ECG- personally reviewed  A/P  1  [name], MD

## 2017-10-11 ENCOUNTER — Ambulatory Visit: Payer: Medicare Other | Admitting: Cardiology

## 2017-10-17 ENCOUNTER — Other Ambulatory Visit: Payer: Self-pay | Admitting: Cardiology

## 2017-10-17 DIAGNOSIS — I10 Essential (primary) hypertension: Secondary | ICD-10-CM

## 2017-10-18 ENCOUNTER — Other Ambulatory Visit: Payer: Self-pay | Admitting: Cardiology

## 2017-10-18 DIAGNOSIS — I10 Essential (primary) hypertension: Secondary | ICD-10-CM

## 2017-10-21 ENCOUNTER — Other Ambulatory Visit: Payer: Self-pay | Admitting: Cardiology

## 2017-10-21 MED ORDER — BISOPROLOL FUMARATE 10 MG PO TABS: 10.0000 mg | ORAL_TABLET | Freq: Every day | ORAL | 2 refills | Status: DC

## 2017-10-21 NOTE — Telephone Encounter (Signed)
°*  STAT* If patient is at the pharmacy, call can be transferred to refill team.   1. Which medications need to be refilled? (please list name of each medication and dose if known) Bisoprolol  2. Which pharmacy/location (including street and city if local pharmacy) is medication to be sent to [facility]  3. Do they need a 30 day or 90 day supply?90 and refills

## 2017-10-24 ENCOUNTER — Encounter: Payer: Self-pay | Admitting: Adult Health

## 2017-10-24 ENCOUNTER — Ambulatory Visit: Payer: Medicare Other | Admitting: Adult Health

## 2017-10-24 VITALS — BP 116/64 | HR 78 | Ht 66.0 in | Wt 166.6 lb

## 2017-10-24 DIAGNOSIS — I480 Paroxysmal atrial fibrillation: Secondary | ICD-10-CM | POA: Diagnosis not present

## 2017-10-24 DIAGNOSIS — I10 Essential (primary) hypertension: Secondary | ICD-10-CM | POA: Diagnosis not present

## 2017-10-24 DIAGNOSIS — I4821 Permanent atrial fibrillation: Secondary | ICD-10-CM

## 2017-10-24 DIAGNOSIS — I83893 Varicose veins of bilateral lower extremities with other complications: Secondary | ICD-10-CM | POA: Diagnosis not present

## 2017-10-24 DIAGNOSIS — I482 Chronic atrial fibrillation: Secondary | ICD-10-CM | POA: Diagnosis not present

## 2017-10-24 MED ORDER — BISOPROLOL FUMARATE 10 MG PO TABS: 10.0000 mg | ORAL_TABLET | Freq: Every day | ORAL | 3 refills | Status: DC

## 2017-10-24 MED ORDER — DABIGATRAN ETEXILATE MESYLATE 150 MG PO CAPS: 150.0000 mg | ORAL_CAPSULE | Freq: Two times a day (BID) | ORAL | 3 refills | Status: DC

## 2017-10-24 MED ORDER — DILTIAZEM HCL ER COATED BEADS 360 MG PO CP24: 360.0000 mg | ORAL_CAPSULE | Freq: Every day | ORAL | 3 refills | Status: DC

## 2017-10-24 MED ORDER — SPIRONOLACTONE 25 MG PO TABS: 25.0000 mg | ORAL_TABLET | Freq: Every day | ORAL | 3 refills | Status: DC

## 2017-10-24 NOTE — Patient Instructions (Signed)
Medication Instructions:   TAKE LASIX TODAY AND MAKE SURE YOU TAKE LASIX WHEN NEEDED  If you need a refill on your cardiac medications before your next appointment, please call your pharmacy.  Follow-Up: Your physician wants you to follow-up in: [place] should receive a reminder letter in the mail two months in advance. If you do not receive a letter, please call our office [phone] to schedule the 06-2018 follow-up appointment.   Thank you for choosing [facility] at [facility]!!

## 2017-10-24 NOTE — Progress Notes (Signed)
Cardiology Office Note   Date:  10/24/2017   ID:  [name] [name], DOB 08-05-1941, MRN [id number]  PCP:  Binnie Rail, MD  Cardiologist:  Dr. Stanford Breed Chief Complaint  Patient presents with  . Follow-up     History of Present Illness: [name] [name] is a 77 y.o. female who presents for ongoing assessment and management episode of atrial fibrillationin [place] in Oct 2012; treated with cardizem and converted spontaneously. Nuclear studyNov 2012 showed EF 69 and normal perfusion. Echocardiogram November 2017 showed ejection fraction 50-55%, mild left ventricular hypertrophy, mild aortic and mitral regurgitation, mild left atrial enlargement and moderate tricuspid regurgitation. Noted to be in recurrent atrial fibrillation at previous ov. Holter monitor December 2017 showed atrial fibrillation with rate mildly elevated. Patient had successful cardioversion on 11/05/2016  She is here today tired. The patient husband is now in a rehabilitation facility in [place] after having had pneumonia during hospitalization, with significant deconditioning. Her husband has advancing dementia as well. She has been driving from [place] to [place] twice a day along with her. Her downstairs bathroom ready for one her husband is discharged. She is pretty exhausted from it but also has a lot of family support and friends who are helping her. Her only complaint is some lower extremity edema.  She is medically compliant. She's been eating a lot more fast food than normal due to constant traveling back and forth.  She's been noticing that when she gets up in the morning her legs are usually normal size but lately they have been mildly edematous, and worsening throughout the day. She had been on Lasix but wasn't noticing any changes in her edema, however she was not having any edema while legs were elevated or when she awakens from sleep. This has changed.  The patient wears support hose.  Past Medical History:  Diagnosis  Date  . Allergic rhinitis    takes Benadryl at bedtime  . Allergy   . Anemia   . Arthritis    osteoarthritis  . Atrial fibrillation ([place]) 06-10-2011   takes Diltiazem and Pradaxa daily  . Cancer of skin of leg   . Cataract    bilateral cataracts removed  . Clotting disorder ([place])    on PRADAXA - chronic a fib  . Collagenous colitis    recurrent, takes Budesonide daily as needed   . Colon polyps 01/14/2010   Hyperplastic  . Cough    states every day of her life and every chest xray is always clear  . GERD (gastroesophageal reflux disease)    takes Omeprazole daily  . History of poliomyelitis    Polio  age 29- no significant neuromuscular deficit  . Hyperlipidemia    was on Pravastatin but joint pain;has been off for 2-56months ;takes CoQ10  . Hypertension    takes Bisoprolol and Chlorthalidone daily  . Potassium deficiency    takes KDUR daily    Past Surgical History:  Procedure Laterality Date  . APPENDECTOMY     at age 49  . BREAST BIOPSY  1947 & 10/07   Benign  . CARDIOVERSION N/A 11/05/2016   Procedure: CARDIOVERSION;  Surgeon: Sueanne Margarita, MD;  [facility]: [facility];  Service: Cardiovascular;  Laterality: N/A;  . cataracts removed    . COLONOSCOPY    . ENDOVENOUS ABLATION SAPHENOUS VEIN W/ LASER  03-03-2011 left greater saphenous vein   . ENDOVENOUS ABLATION SAPHENOUS VEIN W/ LASER  01-06-2011  right greater saphenous vein  . epidural infection  x 2   . IR RADIOLOGIST EVAL & MGMT  03/07/2017  . SKIN CANCER EXCISION     removal of skin cancer on left leg  . TONSILLECTOMY     as a child  . TOTAL KNEE ARTHROPLASTY Right 09/18/2013   Procedure: TOTAL KNEE ARTHROPLASTY;  Surgeon: Garald Balding, MD;  Location: [facility];  Service: Orthopedics;  Laterality: Right;     Current Outpatient Medications  Medication Sig Dispense Refill  . BIOTIN PO Take 1 tablet by mouth daily.    . bisoprolol (ZEBETA) 10 MG tablet Take 1 tablet (10 mg total) by mouth daily. 90  tablet 2  . clindamycin (CLEOCIN) 300 MG capsule Take 2 capsules (600 mg total) by mouth daily as needed. PRN dental procedure 12 capsule 0  . dabigatran (PRADAXA) 150 MG CAPS capsule Take 1 capsule (150 mg total) by mouth every 12 (twelve) hours. 180 capsule 3  . diltiazem (CARDIZEM CD) 360 MG 24 hr capsule Take 1 capsule (360 mg total) by mouth daily. 90 capsule 3  . Doxylamine Succinate, Sleep, (UNISOM PO) Take by mouth at bedtime as needed.    . ranitidine (ZANTAC) 150 MG tablet Take 1 tablet (150 mg total) by mouth 2 (two) times daily. 180 tablet 3  . spironolactone (ALDACTONE) 25 MG tablet TAKE 1 TABLET BY MOUTH ONCE DAILY. 90 tablet 0   Current Facility-Administered Medications  Medication Dose Route Frequency Provider Last Rate Last Dose  . lidocaine (PF) (XYLOCAINE) 1 % injection 2 mL  2 mL Other Once Magnus Sinning, MD      . methylPREDNISolone acetate (DEPO-MEDROL) injection 80 mg  80 mg Other Once Magnus Sinning, MD        Allergies:   Augmentin [amoxicillin-pot clavulanate]; Levaquin [levofloxacin in d5w]; Polysporin [bacitracin-polymyxin b]; Pravastatin; Sulfonamide derivatives; and Erythromycin    Social History:  The patient  reports that she quit smoking about 30 years ago. Her smoking use included cigarettes. She has a 18.00 pack-year smoking history. she has never used smokeless tobacco. She reports that she drinks about 4.2 oz of alcohol per week. She reports that she does not use drugs.   Family History:  The patient's family history includes Alcohol abuse in her father; Colon cancer in her paternal grandmother and son; Heart disease in her father and paternal grandfather; Hypertension in her father and sister; Other in her mother and sister; Peripheral vascular disease in her mother; Rectal cancer (age of onset: 50) in her son; Stomach cancer in her maternal grandfather; Stroke in her father.    ROS: All other systems are reviewed and negative. Unless otherwise  mentioned in H&P    PHYSICAL EXAM: VS:  BP 116/64   Pulse 78   Ht 5\' 6"  (1.676 m)   Wt 166 lb 9.6 oz (75.6 kg)   BMI 26.89 kg/m  , BMI Body mass index is 26.89 kg/m. GEN: Well nourished, well developed, in no acute distress  HEENT: normal  Neck: no JVD, carotid bruits, or masses Cardiac: IRRR; no murmurs, rubs, or gallops,no edema  Respiratory:  Some mild crackles in the bases, no wheezes, rhonchi or coughing. GI: soft, nontender, nondistended, + BS MS: no deformity or atrophy . 1+ pitting edema in the dependent position.  Skin: warm and dry, no rash Neuro:  Strength and sensation are intact Psych: euthymic mood, full affect  Recent Labs: 01/04/2017: TSH 1.35 06/15/2017: B Natriuretic Peptide 525.0 07/24/2017: ALT 14; BUN 25; Creatinine, Ser 1.02; Hemoglobin 14.4; Platelets 255;  Potassium 4.2; Sodium 135    Lipid Panel    Component Value Date/Time   CHOL 194 01/04/2017 0952   TRIG 80.0 01/04/2017 0952   HDL 50.40 01/04/2017 0952   CHOLHDL 4 01/04/2017 0952   VLDL 16.0 01/04/2017 0952   LDLCALC 127 (H) 01/04/2017 0952   LDLDIRECT 128.6 12/09/2010 1141      Wt Readings from Last 3 Encounters:  10/24/17 166 lb 9.6 oz (75.6 kg)  07/24/17 168 lb (76.2 kg)  07/20/17 168 lb (76.2 kg)      Other studies Reviewed: Echocardiogram 07/24/2018 Left ventricle: The cavity size was normal. Wall thickness was   increased in a pattern of mild LVH. Systolic function was normal.   The estimated ejection fraction was in the range of 50% to 55%.   There is hypokinesis of the anteroseptal and apical myocardium.   The study is not technically sufficient to allow evaluation of LV   diastolic function. - Aortic valve: Trileaflet; mildly thickened, mildly calcified   leaflets. There was mild regurgitation. - Aorta: The aorta was mildly calcified. - Mitral valve: There was mild regurgitation. - Left atrium: The atrium was mildly dilated. - Tricuspid valve: There was moderate  regurgitation. - Pulmonary arteries: Systolic pressure was mildly increased. PA   peak pressure: 32 mm Hg (S).  ASSESSMENT AND PLAN:  1.  Permanent Atrial Fib: She denies any symptoms related to atrial fib.  She states that she could not tell when her heart rate is irregular.  Only if it races.  She is medically compliant with diltiazem and Eliquis.  She offers no complaints of bleeding.  I will continue her current medication regimen for now.  May need to repeat echocardiogram on next office visit.  2.  Lower extremity edema: Normally in the dependent position, but has also noted to have some mild edema before she gets out of bed in the morning.  I think it may be a combination of the diltiazem and salt.  She has been eating a good bit of salted food, and fast food, while traveling back and forth to see about her husband.  She had been on Lasix in the past.  Of asked her to take it as needed on an empty stomach with her feet elevated for approximately 30 minutes.  She states she does not always have time to keep her feet elevated when she is looking after her husband but she will make an effort to do so.  3.  Hypertension: Continue bisoprolol, spironolactone, as well as diltiazem.    Current medicines are reviewed at length with the patient today.    Labs/ tests ordered today include:none Phill Myron. [place] Pugh, ANP, AACC   10/24/2017 2:14 PM    [facility] [address], [place], [place] [address] Phone: [phone]; Fax: [phone]

## 2018-01-03 NOTE — Progress Notes (Signed)
Subjective:    Patient ID: [name] [name], female    DOB: [birth year], 77 y.o.   MRN: [id number]  HPI She is here for a physical exam.   She has arthritis and can be very bad at times.  Most of her pain is in her spine - the injections from Dr Ernestina Patches have helped and she plans on seeing him again.  She has occasionally takes tramadol that she has had left over.  She has no side effects.  Tylenol does not help.    Medications and allergies reviewed with patient and updated if appropriate.  Patient Active Problem List   Diagnosis Date Noted  . Hyperglycemia 01/03/2017  . DDD (degenerative disc disease), lumbar 01/03/2017  . Localized edema 04/13/2016  . Osteoarthritis of right knee 09/21/2013  . S/P total knee replacement using cement 09/18/2013  . Anisocoria 07/26/2013  . Varicose veins of lower extremities with other complications 85/46/2703  . Paroxysmal Atrial Fibrillation 06/29/2011  . COUGH, CHRONIC 03/09/2010  . HIP PAIN, BILATERAL 07/17/2009  . ALLERGIC RHINITIS 04/23/2008  . GERD 11/01/2007  . Essential hypertension 09/20/2007  . SKIN CANCER, LEG 07/03/2007  . Hyperlipidemia 07/03/2007  . COLLAGENOUS COLITIS 07/03/2007  . POLIOMYELITIS, HX OF 07/03/2007    Current Outpatient Medications on File Prior to Visit  Medication Sig Dispense Refill  . bisoprolol (ZEBETA) 10 MG tablet Take 1 tablet (10 mg total) by mouth daily. 90 tablet 3  . clindamycin (CLEOCIN) 300 MG capsule Take 2 capsules (600 mg total) by mouth daily as needed. PRN dental procedure 12 capsule 0  . dabigatran (PRADAXA) 150 MG CAPS capsule Take 1 capsule (150 mg total) by mouth every 12 (twelve) hours. 180 capsule 3  . diltiazem (CARDIZEM CD) 360 MG 24 hr capsule Take 1 capsule (360 mg total) by mouth daily. 90 capsule 3  . Doxylamine Succinate, Sleep, (UNISOM PO) Take by mouth at bedtime as needed.    . ranitidine (ZANTAC) 150 MG tablet Take 1 tablet (150 mg total) by mouth 2 (two) times daily. 180 tablet 3   . spironolactone (ALDACTONE) 25 MG tablet Take 1 tablet (25 mg total) by mouth daily. 90 tablet 3   Current Facility-Administered Medications on File Prior to Visit  Medication Dose Route Frequency Provider Last Rate Last Dose  . lidocaine (PF) (XYLOCAINE) 1 % injection 2 mL  2 mL Other Once Magnus Sinning, MD      . methylPREDNISolone acetate (DEPO-MEDROL) injection 80 mg  80 mg Other Once Magnus Sinning, MD        Past Medical History:  Diagnosis Date  . Allergic rhinitis    takes Benadryl at bedtime  . Allergy   . Anemia   . Arthritis    osteoarthritis  . Atrial fibrillation ([place]) 06-10-2011   takes Diltiazem and Pradaxa daily  . Cancer ([place])    skin  . Cataract    bilateral cataracts removed  . Clotting disorder ([place])    on PRADAXA - chronic a fib  . Collagenous colitis    recurrent, takes Budesonide daily as needed   . Colon polyps 01/14/2010   Hyperplastic  . Cough    states every day of her life and every chest xray is always clear  . GERD (gastroesophageal reflux disease)    takes Omeprazole daily  . History of poliomyelitis    Polio  age 19- no significant neuromuscular deficit  . Hyperlipidemia    was on Pravastatin but joint pain;has been off  for 2-59months ;takes CoQ10  . Hypertension    takes Bisoprolol and Chlorthalidone daily  . Potassium deficiency    takes KDUR daily    Past Surgical History:  Procedure Laterality Date  . APPENDECTOMY     at age 58  . BREAST BIOPSY  1947 & 10/07   Benign  . CARDIOVERSION N/A 11/05/2016   Procedure: CARDIOVERSION;  Surgeon: Sueanne Margarita, MD;  Location: [facility];  Service: Cardiovascular;  Laterality: N/A;  . cataracts removed    . COLONOSCOPY    . ENDOVENOUS ABLATION SAPHENOUS VEIN W/ LASER  03-03-2011 left greater saphenous vein   . ENDOVENOUS ABLATION SAPHENOUS VEIN W/ LASER  01-06-2011  right greater saphenous vein  . epidural infection     x 2   . IR RADIOLOGIST EVAL & MGMT  03/07/2017  . SKIN  CANCER EXCISION     removal of skin cancer on left leg  . TONSILLECTOMY     as a child  . TOTAL KNEE ARTHROPLASTY Right 09/18/2013   Procedure: TOTAL KNEE ARTHROPLASTY;  Surgeon: Garald Balding, MD;  Location: [facility];  Service: Orthopedics;  Laterality: Right;    Social History   Socioeconomic History  . Marital status: Married    Spouse name: Not on file  . Number of children: Not on file  . Years of education: Not on file  . Highest education level: Not on file  Occupational History  . Not on file  Social Needs  . Financial resource strain: Not hard at all  . Food insecurity:    Worry: Never true    Inability: Never true  . Transportation needs:    Medical: No    Non-medical: No  Tobacco Use  . Smoking status: Former Smoker    Packs/day: 1.00    Years: 18.00    Pack years: 18.00    Types: Cigarettes    Last attempt to quit: 08/17/1987    Years since quitting: 30.4  . Smokeless tobacco: Never Used  . Tobacco comment: quit smoking 45yrs ago  Substance and Sexual Activity  . Alcohol use: Yes    Alcohol/week: 4.2 oz    Types: 7 Glasses of wine per week    Comment: glass of wine daily  . Drug use: No  . Sexual activity: Not Currently    Partners: Male    Birth control/protection: Surgical  Lifestyle  . Physical activity:    Days per week: 0 days    Minutes per session: 0 min  . Stress: Very much  Relationships  . Social connections:    Talks on phone: More than three times a week    Gets together: More than three times a week    Attends religious service: More than 4 times per year    Active member of club or organization: Not on file    Attends meetings of clubs or organizations: More than 4 times per year    Relationship status: Married  Other Topics Concern  . Not on file  Social History Narrative   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   Married     Former Smoker quit 1989    Alcohol use-yes -  glasses of wine/day      son works for [employer] - she  flies to [place] to visit her grandchildren    Family History  Problem Relation Age of Onset  . Stomach cancer Maternal Grandfather   . Heart disease Paternal Grandfather   .  Colon cancer Son   . Rectal cancer Son 43  . Colon cancer Paternal Grandmother   . Alcohol abuse Father   . Heart disease Father   . Stroke Father   . Hypertension Father   . Hypertension Sister   . Other Sister        varicose veins  . Peripheral vascular disease Mother   . Other Mother        varicose veins  . Esophageal cancer Neg Hx   . Pancreatic cancer Neg Hx     Review of Systems  Constitutional: Negative for chills and fever.  Eyes: Negative for visual disturbance.  Respiratory: Positive for cough (in morning only) and shortness of breath (with incline and related to back pain). Negative for wheezing.   Cardiovascular: Positive for palpitations (occ) and leg swelling (compression socks in winter). Negative for chest pain.  Gastrointestinal: Negative for abdominal pain, blood in stool, constipation, diarrhea and nausea.       Controlled gerd  Genitourinary: Negative for dysuria and hematuria.  Musculoskeletal: Positive for arthralgias and back pain.  Skin: Negative for color change and rash.  Neurological: Negative for light-headedness and headaches.  Psychiatric/Behavioral: Negative for dysphoric mood. The patient is nervous/anxious (controlled).        Objective:   Vitals:   01/04/18 1112  BP: 124/76  Pulse: 68  Resp: 16  Temp: 97.9 F (36.6 C)  SpO2: 99%   Filed Weights   01/04/18 1112  Weight: 160 lb (72.6 kg)   Body mass index is 25.82 kg/m.  Wt Readings from Last 3 Encounters:  01/04/18 160 lb (72.6 kg)  01/04/18 160 lb (72.6 kg)  10/24/17 166 lb 9.6 oz (75.6 kg)     Physical Exam Constitutional: She appears well-developed and well-nourished. No distress.  HENT:  Head: Normocephalic and atraumatic.  Right Ear: External ear normal. Normal ear canal and TM Left Ear:  External ear normal.  Normal ear canal and TM Mouth/Throat: Oropharynx is clear and moist.  Eyes: Conjunctivae and EOM are normal.  Neck: Neck supple. No tracheal deviation present. No thyromegaly present.  No carotid bruit  Cardiovascular: Normal rate, regular rhythm and normal heart sounds.   No murmur heard.  No edema. Pulmonary/Chest: Effort normal and breath sounds normal. No respiratory distress. She has no wheezes. She has no rales.  Breast: deferred to Gyn Abdominal: Soft. She exhibits no distension. There is no tenderness.  Lymphadenopathy: She has no cervical adenopathy.  Skin: Skin is warm and dry. She is not diaphoretic.  Psychiatric: She has a normal mood and affect. Her behavior is normal.        Assessment & Plan:   Physical exam: Screening blood work  ordered Immunizations   Reviewed shingrix, others up to date Colonoscopy    Up to date  Mammogram   Up to date  Gyn   Up to date  Dexa   ? Done via gyn - she will check Eye exams    Up to date  EKG   Last done 06/2017 Exercise  - very active, no regimented exercise, but walks when she can Weight  Ok for age  Skin  - sees derm annually Substance abuse    none  See Problem List for Assessment and Plan of chronic medical problems.   Fu in 6 months

## 2018-01-03 NOTE — Patient Instructions (Addendum)
Test(s) ordered today. Your results will be released to [place] (or called to you) after review, usually within 72hours after test completion. If any changes need to be made, you will be notified at that same time.  All other Health Maintenance issues reviewed.   All recommended immunizations and age-appropriate screenings are up-to-date or discussed.  No immunizations administered today.   Medications reviewed and updated.  No changes recommended at this time.   Please followup in 12 months    Health Maintenance, Female Adopting a healthy lifestyle and getting preventive care can go a long way to promote health and wellness. Talk with your health care provider about what schedule of regular examinations is right for you. This is a good chance for you to check in with your provider about disease prevention and staying healthy. In between checkups, there are plenty of things you can do on your own. Experts have done a lot of research about which lifestyle changes and preventive measures are most likely to keep you healthy. Ask your health care provider for more information. Weight and diet Eat a healthy diet  Be sure to include plenty of vegetables, fruits, low-fat dairy products, and lean protein.  Do not eat a lot of foods high in solid fats, added sugars, or salt.  Get regular exercise. This is one of the most important things you can do for your health. ? Most adults should exercise for at least 150 minutes each week. The exercise should increase your heart rate and make you sweat (moderate-intensity exercise). ? Most adults should also do strengthening exercises at least twice a week. This is in addition to the moderate-intensity exercise.  Maintain a healthy weight  Body mass index (BMI) is a measurement that can be used to identify possible weight problems. It estimates body fat based on height and weight. Your health care provider can help determine your BMI and help you achieve  or maintain a healthy weight.  For females 9 years of age and older: ? A BMI below 18.5 is considered underweight. ? A BMI of 18.5 to 24.9 is normal. ? A BMI of 25 to 29.9 is considered overweight. ? A BMI of 30 and above is considered obese.  Watch levels of cholesterol and blood lipids  You should start having your blood tested for lipids and cholesterol at 77 years of age, then have this test every 5 years.  You may need to have your cholesterol levels checked more often if: ? Your lipid or cholesterol levels are high. ? You are older than 77 years of age. ? You are at high risk for heart disease.  Cancer screening Lung Cancer  Lung cancer screening is recommended for adults 41-12 years old who are at high risk for lung cancer because of a history of smoking.  A yearly low-dose CT scan of the lungs is recommended for people who: ? Currently smoke. ? Have quit within the past 15 years. ? Have at least a 30-pack-year history of smoking. A pack year is smoking an average of one pack of cigarettes a day for 1 year.  Yearly screening should continue until it has been 15 years since you quit.  Yearly screening should stop if you develop a health problem that would prevent you from having lung cancer treatment.  Breast Cancer  Practice breast self-awareness. This means understanding how your breasts normally appear and feel.  It also means doing regular breast self-exams. Let your health care provider know about any  changes, no matter how small.  If you are in your 20s or 30s, you should have a clinical breast exam (CBE) by a health care provider every 1-3 years as part of a regular health exam.  If you are 40 or older, have a CBE every year. Also consider having a breast X-ray (mammogram) every year.  If you have a family history of breast cancer, talk to your health care provider about genetic screening.  If you are at high risk for breast cancer, talk to your health care  provider about having an MRI and a mammogram every year.  Breast cancer gene (BRCA) assessment is recommended for women who have family members with BRCA-related cancers. BRCA-related cancers include: ? Breast. ? Ovarian. ? Tubal. ? Peritoneal cancers.  Results of the assessment will determine the need for genetic counseling and BRCA1 and BRCA2 testing.  Cervical Cancer Your health care provider may recommend that you be screened regularly for cancer of the pelvic organs (ovaries, uterus, and vagina). This screening involves a pelvic examination, including checking for microscopic changes to the surface of your cervix (Pap test). You may be encouraged to have this screening done every 3 years, beginning at age 21.  For women ages 30-65, health care providers may recommend pelvic exams and Pap testing every 3 years, or they may recommend the Pap and pelvic exam, combined with testing for human papilloma virus (HPV), every 5 years. Some types of HPV increase your risk of cervical cancer. Testing for HPV may also be done on women of any age with unclear Pap test results.  Other health care providers may not recommend any screening for nonpregnant women who are considered low risk for pelvic cancer and who do not have symptoms. Ask your health care provider if a screening pelvic exam is right for you.  If you have had past treatment for cervical cancer or a condition that could lead to cancer, you need Pap tests and screening for cancer for at least 20 years after your treatment. If Pap tests have been discontinued, your risk factors (such as having a new sexual partner) need to be reassessed to determine if screening should resume. Some women have medical problems that increase the chance of getting cervical cancer. In these cases, your health care provider may recommend more frequent screening and Pap tests.  Colorectal Cancer  This type of cancer can be detected and often prevented.  Routine  colorectal cancer screening usually begins at 77 years of age and continues through 77 years of age.  Your health care provider may recommend screening at an earlier age if you have risk factors for colon cancer.  Your health care provider may also recommend using home test kits to check for hidden blood in the stool.  A small camera at the end of a tube can be used to examine your colon directly (sigmoidoscopy or colonoscopy). This is done to check for the earliest forms of colorectal cancer.  Routine screening usually begins at age 50.  Direct examination of the colon should be repeated every 5-10 years through 77 years of age. However, you may need to be screened more often if early forms of precancerous polyps or small growths are found.  Skin Cancer  Check your skin from head to toe regularly.  Tell your health care provider about any new moles or changes in moles, especially if there is a change in a mole's shape or color.  Also tell your health care provider if   you have a mole that is larger than the size of a pencil eraser.  Always use sunscreen. Apply sunscreen liberally and repeatedly throughout the day.  Protect yourself by wearing long sleeves, pants, a wide-brimmed hat, and sunglasses whenever you are outside.  Heart disease, diabetes, and high blood pressure  High blood pressure causes heart disease and increases the risk of stroke. High blood pressure is more likely to develop in: ? People who have blood pressure in the high end of the normal range (130-139/85-89 mm Hg). ? People who are overweight or obese. ? People who are African American.  If you are 55-1 years of age, have your blood pressure checked every 3-5 years. If you are 86 years of age or older, have your blood pressure checked every year. You should have your blood pressure measured twice-once when you are at a hospital or clinic, and once when you are not at a hospital or clinic. Record the average of the  two measurements. To check your blood pressure when you are not at a hospital or clinic, you can use: ? An automated blood pressure machine at a pharmacy. ? A home blood pressure monitor.  If you are between 15 years and 81 years old, ask your health care provider if you should take aspirin to prevent strokes.  Have regular diabetes screenings. This involves taking a blood sample to check your fasting blood sugar level. ? If you are at a normal weight and have a low risk for diabetes, have this test once every three years after 77 years of age. ? If you are overweight and have a high risk for diabetes, consider being tested at a younger age or more often. Preventing infection Hepatitis B  If you have a higher risk for hepatitis B, you should be screened for this virus. You are considered at high risk for hepatitis B if: ? You were born in a country where hepatitis B is common. Ask your health care provider which countries are considered high risk. ? Your parents were born in a high-risk country, and you have not been immunized against hepatitis B (hepatitis B vaccine). ? You have HIV or AIDS. ? You use needles to inject street drugs. ? You live with someone who has hepatitis B. ? You have had sex with someone who has hepatitis B. ? You get hemodialysis treatment. ? You take certain medicines for conditions, including cancer, organ transplantation, and autoimmune conditions.  Hepatitis C  Blood testing is recommended for: ? Everyone born from [birth year] through 1965. ? Anyone with known risk factors for hepatitis C.  Sexually transmitted infections (STIs)  You should be screened for sexually transmitted infections (STIs) including gonorrhea and chlamydia if: ? You are sexually active and are younger than 78 years of age. ? You are older than 77 years of age and your health care provider tells you that you are at risk for this type of infection. ? Your sexual activity has changed since you  were last screened and you are at an increased risk for chlamydia or gonorrhea. Ask your health care provider if you are at risk.  If you do not have HIV, but are at risk, it may be recommended that you take a prescription medicine daily to prevent HIV infection. This is called pre-exposure prophylaxis (PrEP). You are considered at risk if: ? You are sexually active and do not regularly use condoms or know the HIV status of your partner(s). ? You take drugs by  injection. ? You are sexually active with a partner who has HIV.  Talk with your health care provider about whether you are at high risk of being infected with HIV. If you choose to begin PrEP, you should first be tested for HIV. You should then be tested every 3 months for as long as you are taking PrEP. Pregnancy  If you are premenopausal and you may become pregnant, ask your health care provider about preconception counseling.  If you may become pregnant, take 400 to 800 micrograms (mcg) of folic acid every day.  If you want to prevent pregnancy, talk to your health care provider about birth control (contraception). Osteoporosis and menopause  Osteoporosis is a disease in which the bones lose minerals and strength with aging. This can result in serious bone fractures. Your risk for osteoporosis can be identified using a bone density scan.  If you are 69 years of age or older, or if you are at risk for osteoporosis and fractures, ask your health care provider if you should be screened.  Ask your health care provider whether you should take a calcium or vitamin D supplement to lower your risk for osteoporosis.  Menopause may have certain physical symptoms and risks.  Hormone replacement therapy may reduce some of these symptoms and risks. Talk to your health care provider about whether hormone replacement therapy is right for you. Follow these instructions at home:  Schedule regular health, dental, and eye exams.  Stay current  with your immunizations.  Do not use any tobacco products including cigarettes, chewing tobacco, or electronic cigarettes.  If you are pregnant, do not drink alcohol.  If you are breastfeeding, limit how much and how often you drink alcohol.  Limit alcohol intake to no more than 1 drink per day for nonpregnant women. One drink equals 12 ounces of beer, 5 ounces of wine, or 1 ounces of hard liquor.  Do not use street drugs.  Do not share needles.  Ask your health care provider for help if you need support or information about quitting drugs.  Tell your health care provider if you often feel depressed.  Tell your health care provider if you have ever been abused or do not feel safe at home. This information is not intended to replace advice given to you by your health care provider. Make sure you discuss any questions you have with your health care provider. Document Released: 02/15/2011 Document Revised: 01/08/2016 Document Reviewed: 05/06/2015 Elsevier Interactive Patient Education  [employer].

## 2018-01-04 ENCOUNTER — Ambulatory Visit (INDEPENDENT_AMBULATORY_CARE_PROVIDER_SITE_OTHER): Payer: Medicare Other | Admitting: Internal Medicine

## 2018-01-04 ENCOUNTER — Other Ambulatory Visit (INDEPENDENT_AMBULATORY_CARE_PROVIDER_SITE_OTHER): Payer: Medicare Other

## 2018-01-04 ENCOUNTER — Ambulatory Visit (INDEPENDENT_AMBULATORY_CARE_PROVIDER_SITE_OTHER): Payer: Medicare Other | Admitting: *Deleted

## 2018-01-04 ENCOUNTER — Encounter: Payer: Self-pay | Admitting: Internal Medicine

## 2018-01-04 VITALS — BP 124/76 | HR 68 | Temp 97.9°F | Resp 18 | Ht 66.0 in | Wt 160.0 lb

## 2018-01-04 VITALS — BP 124/76 | HR 68 | Temp 97.9°F | Resp 16 | Wt 160.0 lb

## 2018-01-04 DIAGNOSIS — R600 Localized edema: Secondary | ICD-10-CM

## 2018-01-04 DIAGNOSIS — K52831 Collagenous colitis: Secondary | ICD-10-CM

## 2018-01-04 DIAGNOSIS — I482 Chronic atrial fibrillation: Secondary | ICD-10-CM

## 2018-01-04 DIAGNOSIS — M5136 Other intervertebral disc degeneration, lumbar region: Secondary | ICD-10-CM

## 2018-01-04 DIAGNOSIS — K219 Gastro-esophageal reflux disease without esophagitis: Secondary | ICD-10-CM | POA: Diagnosis not present

## 2018-01-04 DIAGNOSIS — I4821 Permanent atrial fibrillation: Secondary | ICD-10-CM

## 2018-01-04 DIAGNOSIS — I10 Essential (primary) hypertension: Secondary | ICD-10-CM

## 2018-01-04 DIAGNOSIS — E782 Mixed hyperlipidemia: Secondary | ICD-10-CM

## 2018-01-04 DIAGNOSIS — R739 Hyperglycemia, unspecified: Secondary | ICD-10-CM | POA: Diagnosis not present

## 2018-01-04 DIAGNOSIS — Z0000 Encounter for general adult medical examination without abnormal findings: Secondary | ICD-10-CM

## 2018-01-04 LAB — COMPREHENSIVE METABOLIC PANEL
ALBUMIN: 4.6 g/dL (ref 3.5–5.2)
ALT: 14 U/L (ref 0–35)
AST: 18 U/L (ref 0–37)
Alkaline Phosphatase: 75 U/L (ref 39–117)
BUN: 31 mg/dL — ABNORMAL HIGH (ref 6–23)
CALCIUM: 9.7 mg/dL (ref 8.4–10.5)
CHLORIDE: 100 meq/L (ref 96–112)
CO2: 27 mEq/L (ref 19–32)
CREATININE: 1.06 mg/dL (ref 0.40–1.20)
GFR: 53.4 mL/min — AB (ref >60.00)
Glucose, Bld: 84 mg/dL (ref 70–99)
Potassium: 4.6 mEq/L (ref 3.5–5.1)
Sodium: 136 mEq/L (ref 135–145)
Total Bilirubin: 0.5 mg/dL (ref 0.2–1.2)
Total Protein: 7.8 g/dL (ref 6.0–8.3)

## 2018-01-04 LAB — TSH: TSH: 1.22 u[IU]/mL (ref 0.35–4.50)

## 2018-01-04 LAB — CBC WITH DIFFERENTIAL/PLATELET
BASOS PCT: 0.6 % (ref 0.0–3.0)
Basophils Absolute: 0.1 10*3/uL (ref 0.0–0.1)
Eosinophils Absolute: 0.1 10*3/uL (ref 0.0–0.7)
Eosinophils Relative: 1.2 % (ref 0.0–5.0)
HEMATOCRIT: 41.4 % (ref 36.0–46.0)
HEMOGLOBIN: 13.9 g/dL (ref 12.0–15.0)
LYMPHS PCT: 28.1 % (ref 12.0–46.0)
Lymphs Abs: 3 10*3/uL (ref 0.7–4.0)
MCHC: 33.5 g/dL (ref 30.0–36.0)
MCV: 99.9 fl (ref 78.0–100.0)
Monocytes Absolute: 1.2 10*3/uL — ABNORMAL HIGH (ref 0.1–1.0)
Monocytes Relative: 11.4 % (ref 3.0–12.0)
Neutro Abs: 6.2 10*3/uL (ref 1.4–7.7)
Neutrophils Relative %: 58.7 % (ref 43.0–77.0)
Platelets: 268 10*3/uL (ref 150.0–400.0)
RBC: 4.15 Mil/uL (ref 3.87–5.11)
RDW: 13.7 % (ref 11.5–15.5)
WBC: 10.6 10*3/uL — AB (ref 4.0–10.5)

## 2018-01-04 LAB — LIPID PANEL
CHOLESTEROL: 209 mg/dL — AB (ref 0–200)
HDL: 54.9 mg/dL (ref >39.00)
LDL Cholesterol: 126 mg/dL — ABNORMAL HIGH (ref 0–99)
NonHDL: 154.33
Total CHOL/HDL Ratio: 4
Triglycerides: 140 mg/dL (ref 0.0–149.0)
VLDL: 28 mg/dL (ref 0.0–40.0)

## 2018-01-04 LAB — HEMOGLOBIN A1C: Hgb A1c MFr Bld: 5.5 % (ref 4.6–6.5)

## 2018-01-04 LAB — HM MAMMOGRAPHY

## 2018-01-04 NOTE — Assessment & Plan Note (Signed)
Causes pain from arthritis Has intermittent injections which helps Takes tramadol as needed - I advised I can prescribe if needed - unable to take nsaids Advised to take tylenol regularly - up to 3 gm

## 2018-01-04 NOTE — Assessment & Plan Note (Signed)
In remission from over one year

## 2018-01-04 NOTE — Patient Instructions (Addendum)
Continue doing brain stimulating activities (puzzles, reading, adult coloring books, staying active) to keep memory sharp.   Continue to eat heart healthy diet (full of fruits, vegetables, whole grains, lean protein, water--limit salt, fat, and sugar intake) and increase physical activity as tolerated.   [name] [name] , Thank you for taking time to come for your Medicare Wellness Visit. I appreciate your ongoing commitment to your health goals. Please review the following plan we discussed and let me know if I can assist you in the future.   These are the goals we discussed: Goals    . Patient Stated     I want to take a couple of days away from the care taking responsibilities and relax and enjoy time just for me. I have planned for family to come in twice during the month of July so I can enjoy a beach trip and a lake trip with family.        This is a list of the screening recommended for you and due dates:  Health Maintenance  Topic Date Due  . DEXA scan (bone density measurement)  10/25/2005  . Flu Shot  03/16/2018  . Colon Cancer Screening  09/03/2018  . Tetanus Vaccine  09/20/2019  . Pneumonia vaccines  Completed   Health Maintenance, Female Adopting a healthy lifestyle and getting preventive care can go a long way to promote health and wellness. Talk with your health care provider about what schedule of regular examinations is right for you. This is a good chance for you to check in with your provider about disease prevention and staying healthy. In between checkups, there are plenty of things you can do on your own. Experts have done a lot of research about which lifestyle changes and preventive measures are most likely to keep you healthy. Ask your health care provider for more information. Weight and diet Eat a healthy diet  Be sure to include plenty of vegetables, fruits, low-fat dairy products, and lean protein.  Do not eat a lot of foods high in solid fats, added sugars, or  salt.  Get regular exercise. This is one of the most important things you can do for your health. ? Most adults should exercise for at least 150 minutes each week. The exercise should increase your heart rate and make you sweat (moderate-intensity exercise). ? Most adults should also do strengthening exercises at least twice a week. This is in addition to the moderate-intensity exercise.  Maintain a healthy weight  Body mass index (BMI) is a measurement that can be used to identify possible weight problems. It estimates body fat based on height and weight. Your health care provider can help determine your BMI and help you achieve or maintain a healthy weight.  For females 77 years of age and older: ? A BMI below 18.5 is considered underweight. ? A BMI of 18.5 to 24.9 is normal. ? A BMI of 25 to 29.9 is considered overweight. ? A BMI of 30 and above is considered obese.  Watch levels of cholesterol and blood lipids  You should start having your blood tested for lipids and cholesterol at 77 years of age, then have this test every 5 years.  You may need to have your cholesterol levels checked more often if: ? Your lipid or cholesterol levels are high. ? You are older than 76 years of age. ? You are at high risk for heart disease.  Cancer screening Lung Cancer  Lung cancer screening is recommended for  adults 86-42 years old who are at high risk for lung cancer because of a history of smoking.  A yearly low-dose CT scan of the lungs is recommended for people who: ? Currently smoke. ? Have quit within the past 15 years. ? Have at least a 30-pack-year history of smoking. A pack year is smoking an average of one pack of cigarettes a day for 1 year.  Yearly screening should continue until it has been 15 years since you quit.  Yearly screening should stop if you develop a health problem that would prevent you from having lung cancer treatment.  Breast Cancer  Practice breast  self-awareness. This means understanding how your breasts normally appear and feel.  It also means doing regular breast self-exams. Let your health care provider know about any changes, no matter how small.  If you are in your 20s or 30s, you should have a clinical breast exam (CBE) by a health care provider every 1-3 years as part of a regular health exam.  If you are 11 or older, have a CBE every year. Also consider having a breast X-ray (mammogram) every year.  If you have a family history of breast cancer, talk to your health care provider about genetic screening.  If you are at high risk for breast cancer, talk to your health care provider about having an MRI and a mammogram every year.  Breast cancer gene (BRCA) assessment is recommended for women who have family members with BRCA-related cancers. BRCA-related cancers include: ? Breast. ? Ovarian. ? Tubal. ? Peritoneal cancers.  Results of the assessment will determine the need for genetic counseling and BRCA1 and BRCA2 testing.  Cervical Cancer Your health care provider may recommend that you be screened regularly for cancer of the pelvic organs (ovaries, uterus, and vagina). This screening involves a pelvic examination, including checking for microscopic changes to the surface of your cervix (Pap test). You may be encouraged to have this screening done every 3 years, beginning at age 25.  For women ages 17-65, health care providers may recommend pelvic exams and Pap testing every 3 years, or they may recommend the Pap and pelvic exam, combined with testing for human papilloma virus (HPV), every 5 years. Some types of HPV increase your risk of cervical cancer. Testing for HPV may also be done on women of any age with unclear Pap test results.  Other health care providers may not recommend any screening for nonpregnant women who are considered low risk for pelvic cancer and who do not have symptoms. Ask your health care provider if a  screening pelvic exam is right for you.  If you have had past treatment for cervical cancer or a condition that could lead to cancer, you need Pap tests and screening for cancer for at least 20 years after your treatment. If Pap tests have been discontinued, your risk factors (such as having a new sexual partner) need to be reassessed to determine if screening should resume. Some women have medical problems that increase the chance of getting cervical cancer. In these cases, your health care provider may recommend more frequent screening and Pap tests.  Colorectal Cancer  This type of cancer can be detected and often prevented.  Routine colorectal cancer screening usually begins at 77 years of age and continues through 77 years of age.  Your health care provider may recommend screening at an earlier age if you have risk factors for colon cancer.  Your health care provider may also recommend  using home test kits to check for hidden blood in the stool.  A small camera at the end of a tube can be used to examine your colon directly (sigmoidoscopy or colonoscopy). This is done to check for the earliest forms of colorectal cancer.  Routine screening usually begins at age 63.  Direct examination of the colon should be repeated every 5-10 years through 77 years of age. However, you may need to be screened more often if early forms of precancerous polyps or small growths are found.  Skin Cancer  Check your skin from head to toe regularly.  Tell your health care provider about any new moles or changes in moles, especially if there is a change in a mole's shape or color.  Also tell your health care provider if you have a mole that is larger than the size of a pencil eraser.  Always use sunscreen. Apply sunscreen liberally and repeatedly throughout the day.  Protect yourself by wearing long sleeves, pants, a wide-brimmed hat, and sunglasses whenever you are outside.  Heart disease, diabetes, and  high blood pressure  High blood pressure causes heart disease and increases the risk of stroke. High blood pressure is more likely to develop in: ? People who have blood pressure in the high end of the normal range (130-139/85-89 mm Hg). ? People who are overweight or obese. ? People who are African American.  If you are 13-51 years of age, have your blood pressure checked every 3-5 years. If you are 70 years of age or older, have your blood pressure checked every year. You should have your blood pressure measured twice-once when you are at a hospital or clinic, and once when you are not at a hospital or clinic. Record the average of the two measurements. To check your blood pressure when you are not at a hospital or clinic, you can use: ? An automated blood pressure machine at a pharmacy. ? A home blood pressure monitor.  If you are between 71 years and 70 years old, ask your health care provider if you should take aspirin to prevent strokes.  Have regular diabetes screenings. This involves taking a blood sample to check your fasting blood sugar level. ? If you are at a normal weight and have a low risk for diabetes, have this test once every three years after 77 years of age. ? If you are overweight and have a high risk for diabetes, consider being tested at a younger age or more often. Preventing infection Hepatitis B  If you have a higher risk for hepatitis B, you should be screened for this virus. You are considered at high risk for hepatitis B if: ? You were born in a country where hepatitis B is common. Ask your health care provider which countries are considered high risk. ? Your parents were born in a high-risk country, and you have not been immunized against hepatitis B (hepatitis B vaccine). ? You have HIV or AIDS. ? You use needles to inject street drugs. ? You live with someone who has hepatitis B. ? You have had sex with someone who has hepatitis B. ? You get hemodialysis  treatment. ? You take certain medicines for conditions, including cancer, organ transplantation, and autoimmune conditions.  Hepatitis C  Blood testing is recommended for: ? Everyone born from [birth year] through 1965. ? Anyone with known risk factors for hepatitis C.  Sexually transmitted infections (STIs)  You should be screened for sexually transmitted infections (STIs) including gonorrhea  and chlamydia if: ? You are sexually active and are younger than 77 years of age. ? You are older than 77 years of age and your health care provider tells you that you are at risk for this type of infection. ? Your sexual activity has changed since you were last screened and you are at an increased risk for chlamydia or gonorrhea. Ask your health care provider if you are at risk.  If you do not have HIV, but are at risk, it may be recommended that you take a prescription medicine daily to prevent HIV infection. This is called pre-exposure prophylaxis (PrEP). You are considered at risk if: ? You are sexually active and do not regularly use condoms or know the HIV status of your partner(s). ? You take drugs by injection. ? You are sexually active with a partner who has HIV.  Talk with your health care provider about whether you are at high risk of being infected with HIV. If you choose to begin PrEP, you should first be tested for HIV. You should then be tested every 3 months for as long as you are taking PrEP. Pregnancy  If you are premenopausal and you may become pregnant, ask your health care provider about preconception counseling.  If you may become pregnant, take 400 to 800 micrograms (mcg) of folic acid every day.  If you want to prevent pregnancy, talk to your health care provider about birth control (contraception). Osteoporosis and menopause  Osteoporosis is a disease in which the bones lose minerals and strength with aging. This can result in serious bone fractures. Your risk for osteoporosis  can be identified using a bone density scan.  If you are 53 years of age or older, or if you are at risk for osteoporosis and fractures, ask your health care provider if you should be screened.  Ask your health care provider whether you should take a calcium or vitamin D supplement to lower your risk for osteoporosis.  Menopause may have certain physical symptoms and risks.  Hormone replacement therapy may reduce some of these symptoms and risks. Talk to your health care provider about whether hormone replacement therapy is right for you. Follow these instructions at home:  Schedule regular health, dental, and eye exams.  Stay current with your immunizations.  Do not use any tobacco products including cigarettes, chewing tobacco, or electronic cigarettes.  If you are pregnant, do not drink alcohol.  If you are breastfeeding, limit how much and how often you drink alcohol.  Limit alcohol intake to no more than 1 drink per day for nonpregnant women. One drink equals 12 ounces of beer, 5 ounces of wine, or 1 ounces of hard liquor.  Do not use street drugs.  Do not share needles.  Ask your health care provider for help if you need support or information about quitting drugs.  Tell your health care provider if you often feel depressed.  Tell your health care provider if you have ever been abused or do not feel safe at home. This information is not intended to replace advice given to you by your health care provider. Make sure you discuss any questions you have with your health care provider. Document Released: 02/15/2011 Document Revised: 01/08/2016 Document Reviewed: 05/06/2015 [employer]vier Interactive Patient Education  [employer].

## 2018-01-04 NOTE — Assessment & Plan Note (Signed)
related to venous insuff Wears compression socks in winter only Elevate legs when able

## 2018-01-04 NOTE — Progress Notes (Addendum)
Subjective:   [name] [name] is a 77 y.o. female who presents for Medicare Annual (Subsequent) preventive examination.  Review of Systems:  No ROS.  Medicare Wellness Visit. Additional risk factors are reflected in the social history.  Cardiac Risk Factors include: advanced age (>65men, >42 women);dyslipidemia;hypertension Sleep patterns: feels rested on waking, gets up 1 times nightly to void and sleeps 7-8 hours nightly.    Home Safety/Smoke Alarms: Feels safe in home. Smoke alarms in place.  Living environment; residence and Firearm Safety: 2-story house, no firearms. Lives with husband, no needs for DME, good support system Seat Belt Safety/Bike Helmet: Wears seat belt.      Objective:     Vitals: BP 124/76   Pulse 68   Temp 97.9 F (36.6 C)   Resp 18   Ht 5\' 6"  (1.676 m)   Wt 160 lb (72.6 kg)   SpO2 99%   BMI 25.82 kg/m   Body mass index is 25.82 kg/m.  Advanced Directives 01/04/2018 07/24/2017 06/15/2017 09/14/2016 09/04/2015 09/18/2013 09/05/2013  Does Patient Have a Medical Advance Directive? Yes Yes Yes Yes No Patient does not have advance directive;Patient would not like information Patient does not have advance directive;Patient would not like information  Type of Scientist, forensic Power of [place];Living will [place];Living will [place];Living will [place];Living will - - -  Copy of [place] in Chart? No - copy requested No - copy requested - - - - -  Would patient like information on creating a medical advance directive? - No - Patient declined - - No - patient declined information - -  Pre-existing out of facility DNR order (yellow form or pink MOST form) - - - - - No No    Tobacco Social History   Tobacco Use  Smoking Status Former Smoker  . Packs/day: 1.00  . Years: 18.00  . Pack years: 18.00  . Types: Cigarettes  . Last attempt to quit: 08/17/1987  . Years since  quitting: 30.4  Smokeless Tobacco Never Used  Tobacco Comment   quit smoking 32yrs ago     Counseling given: Not Answered Comment: quit smoking 26yrs ago  Past Medical History:  Diagnosis Date  . Allergic rhinitis    takes Benadryl at bedtime  . Allergy   . Anemia   . Arthritis    osteoarthritis  . Atrial fibrillation ([place]) 06-10-2011   takes Diltiazem and Pradaxa daily  . Cancer ([place])    skin  . Cataract    bilateral cataracts removed  . Clotting disorder ([place])    on PRADAXA - chronic a fib  . Collagenous colitis    recurrent, takes Budesonide daily as needed   . Colon polyps 01/14/2010   Hyperplastic  . Cough    states every day of her life and every chest xray is always clear  . GERD (gastroesophageal reflux disease)    takes Omeprazole daily  . History of poliomyelitis    Polio  age 71- no significant neuromuscular deficit  . Hyperlipidemia    was on Pravastatin but joint pain;has been off for 2-72months ;takes CoQ10  . Hypertension    takes Bisoprolol and Chlorthalidone daily  . Potassium deficiency    takes KDUR daily   Past Surgical History:  Procedure Laterality Date  . APPENDECTOMY     at age 30  . BREAST BIOPSY  1947 & 10/07   Benign  . CARDIOVERSION N/A 11/05/2016  Procedure: CARDIOVERSION;  Surgeon: Sueanne Margarita, MD;  Location: [facility] ENDOSCOPY;  Service: Cardiovascular;  Laterality: N/A;  . cataracts removed    . COLONOSCOPY    . ENDOVENOUS ABLATION SAPHENOUS VEIN W/ LASER  03-03-2011 left greater saphenous vein   . ENDOVENOUS ABLATION SAPHENOUS VEIN W/ LASER  01-06-2011  right greater saphenous vein  . epidural infection     x 2   . IR RADIOLOGIST EVAL & MGMT  03/07/2017  . SKIN CANCER EXCISION     removal of skin cancer on left leg  . TONSILLECTOMY     as a child  . TOTAL KNEE ARTHROPLASTY Right 09/18/2013   Procedure: TOTAL KNEE ARTHROPLASTY;  Surgeon: Garald Balding, MD;  Location: [place];  Service: Orthopedics;  Laterality: Right;   Family  History  Problem Relation Age of Onset  . Stomach cancer Maternal Grandfather   . Heart disease Paternal Grandfather   . Colon cancer Son   . Rectal cancer Son 30  . Colon cancer Paternal Grandmother   . Alcohol abuse Father   . Heart disease Father   . Stroke Father   . Hypertension Father   . Hypertension Sister   . Other Sister        varicose veins  . Peripheral vascular disease Mother   . Other Mother        varicose veins  . Esophageal cancer Neg Hx   . Pancreatic cancer Neg Hx    Social History   Socioeconomic History  . Marital status: Married    Spouse name: Not on file  . Number of children: Not on file  . Years of education: Not on file  . Highest education level: Not on file  Occupational History  . Not on file  Social Needs  . Financial resource strain: Not hard at all  . Food insecurity:    Worry: Never true    Inability: Never true  . Transportation needs:    Medical: No    Non-medical: No  Tobacco Use  . Smoking status: Former Smoker    Packs/day: 1.00    Years: 18.00    Pack years: 18.00    Types: Cigarettes    Last attempt to quit: 08/17/1987    Years since quitting: 30.4  . Smokeless tobacco: Never Used  . Tobacco comment: quit smoking 52yrs ago  Substance and Sexual Activity  . Alcohol use: Yes    Alcohol/week: 4.2 oz    Types: 7 Glasses of Rutilio Yellowhair per week    Comment: glass of Kriston Pasquarello daily  . Drug use: No  . Sexual activity: Not Currently    Partners: Male    Birth control/protection: Surgical  Lifestyle  . Physical activity:    Days per week: 0 days    Minutes per session: 0 min  . Stress: Very much  Relationships  . Social connections:    Talks on phone: More than three times a week    Gets together: More than three times a week    Attends religious service: More than 4 times per year    Active member of club or organization: Not on file    Attends meetings of clubs or organizations: More than 4 times per year    Relationship  status: Married  Other Topics Concern  . 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 bisoprolol (ZEBETA) 10 MG tablet Take 1 tablet (10 mg total) by mouth daily.  . clindamycin (CLEOCIN) 300 MG capsule Take 2 capsules (600 mg total) by mouth daily as needed. PRN dental procedure  . dabigatran (PRADAXA) 150 MG CAPS capsule Take 1 capsule (150 mg total) by mouth every 12 (twelve) hours.  Marland Kitchen diltiazem (CARDIZEM CD) 360 MG 24 hr capsule Take 1 capsule (360 mg total) by mouth daily.  . Doxylamine Succinate, Sleep, (UNISOM PO) Take by mouth at bedtime as needed.  . ranitidine (ZANTAC) 150 MG tablet Take 1 tablet (150 mg total) by mouth 2 (two) times daily.  Marland Kitchen spironolactone (ALDACTONE) 25 MG tablet Take 1 tablet (25 mg total) by mouth daily.  . [DISCONTINUED] BIOTIN PO Take 1 tablet by mouth daily.   Facility-Administered Encounter Medications as of 01/04/2018  Medication  . lidocaine (PF) (XYLOCAINE) 1 % injection 2 mL  . methylPREDNISolone acetate (DEPO-MEDROL) injection 80 mg    Activities of Daily Living In your present state of health, do you have any difficulty performing the following activities: 01/04/2018  Hearing? N  Vision? N  Difficulty concentrating or making decisions? N  Walking or climbing stairs? N  Dressing or bathing? N  Doing errands, shopping? N  Preparing Food and eating ? N  Using the Toilet? N  In the past six months, have you accidently leaked urine? N  Do you have problems with loss of bowel control? N  Managing your Medications? N  Managing your Finances? N  Housekeeping or managing your Housekeeping? N  Some recent data might be hidden    Patient Care Team: Binnie Rail, MD as PCP -  General (Internal Medicine) Stanford Breed Denice Bors, MD as PCP - Cardiology (Cardiology) Lafayette Dragon, MD (Inactive) (Gastroenterology) Garald Balding, MD (Orthopedic Surgery) Lelon Perla, MD (Cardiology)    Assessment:   This is a routine wellness examination for [place]. Physical assessment deferred to PCP.   Exercise Activities and Dietary recommendations Current Exercise Habits: Home exercise routine, Type of exercise: walking(gardening), Time (Minutes): 35, Frequency (Times/Week): 3, Weekly Exercise (Minutes/Week): 105, Intensity: Mild, Exercise limited by: orthopedic condition(s) Diet (meal preparation, eat out, water intake, caffeinated beverages, dairy products, fruits and vegetables): in general, a "healthy" diet  , well balanced, eats a variety of fruits and vegetables daily, limits salt, fat/cholesterol, sugar,carbohydrates,caffeine.   Reviewed heart healthy diet, encouraged patient to increase daily water intake.  Goals    . Patient Stated     I want to take a couple of days away from the care taking responsibilities and relax and enjoy time just for me. I have planned for family to come in twice during the month of July so I can enjoy a beach trip and a lake trip with family.        Fall Risk Fall Risk  01/04/2018 01/03/2017 10/08/2014  Falls in the past year? Yes No Yes  Number falls in past yr: 2 or more - 1  Injury with Fall? No - No  Risk for fall due to : Impaired balance/gait;Impaired mobility - -  Follow up Falls prevention discussed - -   Depression Screen PHQ 2/9 Scores 01/04/2018 01/03/2017 10/08/2014  PHQ - 2 Score 1 0 1  PHQ-  9 Score 2 - -     Cognitive Function MMSE - Mini Mental State Exam 01/04/2018  Orientation to time 5  Orientation to Place 5  Registration 3  Attention/ Calculation 5  Recall 3  Language- name 2 objects 2  Language- repeat 1  Language- follow 3 step command 3  Language- read & follow direction 1  Write a sentence 1  Copy  design 1  Total score 30        Immunization History  Administered Date(s) Administered  . Influenza Split 06/29/2011, 06/09/2012  . Influenza Whole 06/05/2007, 06/04/2008, 04/27/2009  . Influenza-Unspecified 05/16/2013, 06/17/2015, 06/15/2017  . Pneumococcal Conjugate-13 07/24/2013  . Pneumococcal Polysaccharide-23 04/23/2008  . Td 10/20/1998, 09/19/2009  . Zoster 01/25/2006   Screening Tests Health Maintenance  Topic Date Due  . DEXA SCAN  10/25/2005  . INFLUENZA VACCINE  03/16/2018  . COLONOSCOPY  09/03/2018  . TETANUS/TDAP  09/20/2019  . PNA vac Low Risk Adult  Completed      Plan:     Patient will contact [facility] and schedule a bone density scan. She will request that the facility send the results to Dr. Quay Burow.   Continue doing brain stimulating activities (puzzles, reading, adult coloring books, staying active) to keep memory sharp.   Continue to eat heart healthy diet (full of fruits, vegetables, whole grains, lean protein, water--limit salt, fat, and sugar intake) and increase physical activity as tolerated.  I have personally reviewed and noted the following in the patient's chart:   . Medical and social history . Use of alcohol, tobacco or illicit drugs  . Current medications and supplements . Functional ability and status . Nutritional status . Physical activity . Advanced directives . List of other physicians . Vitals . Screenings to include cognitive, depression, and falls . Referrals and appointments  In addition, I have reviewed and discussed with patient certain preventive protocols, quality metrics, and best practice recommendations. A written personalized care plan for preventive services as well as general preventive health recommendations were provided to patient.     Michiel Cowboy, RN  01/04/2018   Medical screening examination/treatment/procedure(s) were performed by non-physician practitioner and as supervising physician I was  immediately available for consultation/collaboration. I agree with above. Binnie Rail, MD

## 2018-01-04 NOTE — Assessment & Plan Note (Signed)
a1c

## 2018-01-04 NOTE — Assessment & Plan Note (Signed)
Rate controlled occ palps - otherwise asymptomatic Following with cardio continue current meds Cbc, cmp, tsh

## 2018-01-04 NOTE — Assessment & Plan Note (Signed)
BP well controlled Current regimen effective and well tolerated Continue current medications at current doses cmp  

## 2018-01-04 NOTE — Assessment & Plan Note (Signed)
GERD controlled Continue daily medication  

## 2018-01-05 ENCOUNTER — Encounter: Payer: Self-pay | Admitting: Internal Medicine

## 2018-01-12 ENCOUNTER — Encounter: Payer: Self-pay | Admitting: Internal Medicine

## 2018-01-13 ENCOUNTER — Encounter: Payer: Self-pay | Admitting: Internal Medicine

## 2018-01-18 ENCOUNTER — Encounter: Payer: Self-pay | Admitting: Internal Medicine

## 2018-03-23 ENCOUNTER — Other Ambulatory Visit: Payer: Self-pay | Admitting: Emergency Medicine

## 2018-03-23 MED ORDER — RANITIDINE HCL 150 MG PO TABS: 150.0000 mg | ORAL_TABLET | Freq: Two times a day (BID) | ORAL | 2 refills | Status: DC

## 2018-06-19 NOTE — Progress Notes (Signed)
HPI: FU AFib. Patient had episode of atrial fibrillationin [place] in Oct 2012; treated with cardizem and converted spontaneously. Nuclear studyNov 2012 showed EF 69 and normal perfusion. Echocardiogram November 2017 showed ejection fraction 50-55%, mild left ventricular hypertrophy, mild aortic and mitral regurgitation, mild left atrial enlargement and moderate tricuspid regurgitation. Noted to be in recurrent atrial fibrillation at previous ov. Holter monitor December 2017 showed atrial fibrillation with rate mildly elevated. Patient had successful cardioversion on 11/05/2016. Following cardioversion she was found to be bradycardic and her medications were decreased. When I last saw her in April 2018 she had developed recurrent atrial fibrillation and we elected rate control. Since I last saw her,  she denies dyspnea, chest pain, palpitations or syncope.  She does have problems with lower extremity edema.  Current Outpatient Medications  Medication Sig Dispense Refill  . bisoprolol (ZEBETA) 10 MG tablet Take 1 tablet (10 mg total) by mouth daily. 90 tablet 3  . clindamycin (CLEOCIN) 300 MG capsule Take 2 capsules (600 mg total) by mouth daily as needed. PRN dental procedure 12 capsule 0  . dabigatran (PRADAXA) 150 MG CAPS capsule Take 1 capsule (150 mg total) by mouth every 12 (twelve) hours. 180 capsule 3  . diltiazem (CARDIZEM CD) 360 MG 24 hr capsule Take 1 capsule (360 mg total) by mouth daily. 90 capsule 3  . Doxylamine Succinate, Sleep, (UNISOM PO) Take by mouth at bedtime as needed.    . ranitidine (ZANTAC) 150 MG tablet Take 1 tablet (150 mg total) by mouth 2 (two) times daily. 180 tablet 2  . spironolactone (ALDACTONE) 25 MG tablet Take 1 tablet (25 mg total) by mouth daily. 90 tablet 3   No current facility-administered medications for this visit.      Past Medical History:  Diagnosis Date  . Allergic rhinitis    takes Benadryl at bedtime  . Allergy   . Anemia   .  Arthritis    osteoarthritis  . Atrial fibrillation ([place]) 06-10-2011   takes Diltiazem and Pradaxa daily  . Cancer ([place])    skin  . Cataract    bilateral cataracts removed  . Clotting disorder ([place])    on PRADAXA - chronic a fib  . Collagenous colitis    recurrent, takes Budesonide daily as needed   . Colon polyps 01/14/2010   Hyperplastic  . Cough    states every day of her life and every chest xray is always clear  . GERD (gastroesophageal reflux disease)    takes Omeprazole daily  . History of poliomyelitis    Polio  age 48- no significant neuromuscular deficit  . Hyperlipidemia    was on Pravastatin but joint pain;has been off for 2-66months ;takes CoQ10  . Hypertension    takes Bisoprolol and Chlorthalidone daily  . Potassium deficiency    takes KDUR daily    Past Surgical History:  Procedure Laterality Date  . APPENDECTOMY     at age 30  . BREAST BIOPSY  1947 & 10/07   Benign  . CARDIOVERSION N/A 11/05/2016   Procedure: CARDIOVERSION;  Surgeon: Sueanne Margarita, MD;  Location: [facility] ENDOSCOPY;  Service: Cardiovascular;  Laterality: N/A;  . cataracts removed    . COLONOSCOPY    . ENDOVENOUS ABLATION SAPHENOUS VEIN W/ LASER  03-03-2011 left greater saphenous vein   . ENDOVENOUS ABLATION SAPHENOUS VEIN W/ LASER  01-06-2011  right greater saphenous vein  . epidural infection     x 2   .  IR RADIOLOGIST EVAL & MGMT  03/07/2017  . SKIN CANCER EXCISION     removal of skin cancer on left leg  . TONSILLECTOMY     as a child  . TOTAL KNEE ARTHROPLASTY Right 09/18/2013   Procedure: TOTAL KNEE ARTHROPLASTY;  Surgeon: Garald Balding, MD;  Location: [employer];  Service: Orthopedics;  Laterality: Right;    Social History   Socioeconomic History  . Marital status: Married    Spouse name: Not on file  . Number of children: Not on file  . Years of education: Not on file  . Highest education level: Not on file  Occupational History  . Not on file  Social Needs  . Financial resource  strain: Not hard at all  . Food insecurity:    Worry: Never true    Inability: Never true  . Transportation needs:    Medical: No    Non-medical: No  Tobacco Use  . Smoking status: Former Smoker    Packs/day: 1.00    Years: 18.00    Pack years: 18.00    Types: Cigarettes    Last attempt to quit: 08/17/1987    Years since quitting: 30.8  . Smokeless tobacco: Never Used  . Tobacco comment: quit smoking 97yrs ago  Substance and Sexual Activity  . Alcohol use: Yes    Alcohol/week: 7.0 standard drinks    Types: 7 Glasses of wine per week    Comment: glass of wine daily  . Drug use: No  . Sexual activity: Not Currently    Partners: Male    Birth control/protection: Surgical  Lifestyle  . Physical activity:    Days per week: 0 days    Minutes per session: 0 min  . Stress: Very much  Relationships  . Social connections:    Talks on phone: More than three times a week    Gets together: More than three times a week    Attends religious service: More than 4 times per year    Active member of club or organization: Not on file    Attends meetings of clubs or organizations: More than 4 times per year    Relationship status: Married  . Intimate partner violence:    Fear of current or ex partner: No    Emotionally abused: No    Physically abused: No    Forced sexual activity: No  Other Topics Concern  . 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 Stomach cancer Maternal Grandfather   . Heart disease Paternal Grandfather   . Colon cancer Son   . Rectal cancer Son 72  . Colon cancer Paternal Grandmother   . Alcohol abuse Father   . Heart disease Father   . Stroke Father   . Hypertension Father   . Hypertension Sister   . Other  Sister        varicose veins  . Peripheral vascular disease Mother   . Other Mother        varicose veins  . Esophageal cancer Neg Hx   . Pancreatic cancer Neg Hx     ROS: Arthralgias but no  fevers or chills, productive cough, hemoptysis, dysphasia, odynophagia, melena, hematochezia, dysuria, hematuria, rash, seizure activity, orthopnea, PND, claudication. Remaining systems are negative.  Physical Exam: Well-developed well-nourished in no acute distress.  Skin is warm and dry.  HEENT is normal.  Neck is supple.  Chest is clear to auscultation with normal expansion.  Cardiovascular exam is irregular Abdominal exam nontender or distended. No masses palpated. Extremities show 1+ edema. neuro grossly intact  ECG-atrial fibrillation at a rate of 81.  No ST changes.  Personally reviewed  A/P  1 permanent atrial fibrillation-plan is rate control and anticoagulation.  Plan to continue bisoprolol and Cardizem at present dose.  Check hemoglobin and renal function.  Change Pradaxa to apixaban 5 mg twice daily.  2 hypertension-patient's blood pressure is controlled.  Continue present medications and follow.  3 hyperlipidemia-intolerant to statins. Continue diet. No h/o vascular disease.  4 pedal edema-plan to continue spironolactone.  We discussed the importance of fluid restriction and low-sodium diet.  I have asked her to keep her feet elevated and to continue compression hose.  [name], MD

## 2018-06-28 ENCOUNTER — Encounter: Payer: Self-pay | Admitting: Cardiology

## 2018-06-28 ENCOUNTER — Ambulatory Visit: Payer: Medicare Other | Admitting: Cardiology

## 2018-06-28 VITALS — BP 102/68 | HR 81 | Ht 66.0 in | Wt 167.0 lb

## 2018-06-28 DIAGNOSIS — Z7901 Long term (current) use of anticoagulants: Secondary | ICD-10-CM

## 2018-06-28 DIAGNOSIS — R609 Edema, unspecified: Secondary | ICD-10-CM | POA: Diagnosis not present

## 2018-06-28 DIAGNOSIS — Z5181 Encounter for therapeutic drug level monitoring: Secondary | ICD-10-CM

## 2018-06-28 DIAGNOSIS — I4821 Permanent atrial fibrillation: Secondary | ICD-10-CM

## 2018-06-28 DIAGNOSIS — I10 Essential (primary) hypertension: Secondary | ICD-10-CM | POA: Diagnosis not present

## 2018-06-28 LAB — CBC
HEMATOCRIT: 38.1 % (ref 34.0–46.6)
Hemoglobin: 12.7 g/dL (ref 11.1–15.9)
MCH: 33.2 pg — ABNORMAL HIGH (ref 26.6–33.0)
MCHC: 33.3 g/dL (ref 31.5–35.7)
MCV: 100 fL — AB (ref 79–97)
Platelets: 250 10*3/uL (ref 150–450)
RBC: 3.83 x10E6/uL (ref 3.77–5.28)
RDW: 12.4 % (ref 12.3–15.4)
WBC: 8.3 10*3/uL (ref 3.4–10.8)

## 2018-06-28 LAB — BASIC METABOLIC PANEL
BUN/Creatinine Ratio: 25 (ref 12–28)
BUN: 26 mg/dL (ref 8–27)
CO2: 22 mmol/L (ref 20–29)
Calcium: 9.5 mg/dL (ref 8.7–10.3)
Chloride: 100 mmol/L (ref 96–106)
Creatinine, Ser: 1.06 mg/dL — ABNORMAL HIGH (ref 0.57–1.00)
GFR, EST AFRICAN AMERICAN: 59 mL/min/{1.73_m2} — AB (ref >59)
GFR, EST NON AFRICAN AMERICAN: 51 mL/min/{1.73_m2} — AB (ref >59)
Glucose: 82 mg/dL (ref 65–99)
POTASSIUM: 4.6 mmol/L (ref 3.5–5.2)
SODIUM: 138 mmol/L (ref 134–144)

## 2018-06-28 MED ORDER — APIXABAN 5 MG PO TABS: 5.0000 mg | ORAL_TABLET | Freq: Two times a day (BID) | ORAL | 3 refills | Status: DC

## 2018-06-28 NOTE — Patient Instructions (Signed)
Medication Instructions:  When you run out of Pradaxa-stop and start Eliquis 5 mg two times daily  If you need a refill on your cardiac medications before your next appointment, please call your pharmacy.   Lab work: Today (BMET, CBC) If you have labs (blood work) drawn today and your tests are completely normal, you will receive your results only by: Marland Kitchen MyChart Message (if you have MyChart) OR . A paper copy in the mail If you have any lab test that is abnormal or we need to change your treatment, we will call you to review the results.  Follow-Up: At [facility], you and your health needs are our priority.  As part of our continuing mission to provide you with exceptional heart care, we have created designated Provider Care Teams.  These Care Teams include your primary Cardiologist (physician) and Advanced Practice Providers (APPs -  Physician Assistants and Nurse Practitioners) who all work together to provide you with the care you need, when you need it. You will need a follow up appointment in 6 months.  Please call our office 2 months in advance to schedule this appointment.  You may see [name], MD or one of the following Advanced Practice Providers on your designated Care Team:   Kerin Ransom, PA-C Roby Lofts, [place] . Sande Rives, PA-C

## 2018-07-03 ENCOUNTER — Encounter: Payer: Self-pay | Admitting: Internal Medicine

## 2018-07-04 ENCOUNTER — Encounter: Payer: Self-pay | Admitting: Internal Medicine

## 2018-07-04 NOTE — Telephone Encounter (Signed)
Documented flu shot.../LMB  

## 2018-08-23 ENCOUNTER — Telehealth: Payer: Self-pay

## 2018-08-23 ENCOUNTER — Encounter: Payer: Self-pay | Admitting: Nurse Practitioner

## 2018-08-23 ENCOUNTER — Ambulatory Visit: Payer: Medicare Other | Admitting: Nurse Practitioner

## 2018-08-23 VITALS — BP 128/70 | HR 62 | Ht 66.0 in | Wt 168.0 lb

## 2018-08-23 DIAGNOSIS — Z7901 Long term (current) use of anticoagulants: Secondary | ICD-10-CM

## 2018-08-23 DIAGNOSIS — Z86010 Personal history of colonic polyps: Secondary | ICD-10-CM

## 2018-08-23 MED ORDER — NA SULFATE-K SULFATE-MG SULF 17.5-3.13-1.6 GM/177ML PO SOLN: ORAL | 0 refills | Status: DC

## 2018-08-23 NOTE — Telephone Encounter (Signed)
[facility] Pre-operative Risk Assessment     Request for surgical clearance:     Endoscopy Procedure  What type of surgery is being performed?     Colonoscopy  When is this surgery scheduled?     09/20/18  What type of clearance is required ?   Pharmacy  Are there any medications that need to be held prior to surgery and how long? HOLD ELIQUIS 2 DAYS PRIOR  Practice name and name of physician performing surgery?      [facility]/ [place]  What is your office phone and fax number?      Phone- [phone]  [phone]  Anesthesia type (None, local, MAC, general) ?       MAC

## 2018-08-23 NOTE — Progress Notes (Signed)
Chief Complaint:    Colon cancer screening.   IMPRESSION and PLAN:    33.  78 yo female with history of colon polyps, due for 3 year surveillance colonoscopy. She has GI complaints at this time.  -will schedule for surveillance colonoscopy. The risks and benefits of colonoscopy with possible polypectomy were discussed and the patient agrees to proceed. Eliquis will need to be held, see #2.   2. AFIB on Eliquis. No recent palpitations or chest discomfort -Hold Eliquis for 2 days before procedure - will instruct when and how to resume after procedure. Patient understands that there is a low but real risk of cardiovascular event such as heart attack, stroke, or embolism /  thrombosis while off blood thinner. The patient consents to proceed. Will communicate by phone or EMR with patient's prescribing provider to confirm that holding Eliquis is reasonable in this case.    HPI:     Patient is a 78 year old female with HTN, permanent atrial fibrillation on Eliquis. She is followed by Dr. Silverio Decamp for colon polyps, hyperplastic gastric polyps,  family hx of colon cancer and a history of collagenous colitis. She is due for 3 year surveillance colonoscopy and is here to discuss. She has no GI complaints. No bowel changes, blood in stool, or abdominal pain. Patient feels the best that she has in a long time. She underwent cardioversion in March 2018. Following that her meds were decreased but she developed recurrent AFIB. Focus has since been on rate control.    Review of systems:     No chest pain, no SOB, no fevers, no urinary sx   Past Medical History:  Diagnosis Date  . Allergic rhinitis    takes Benadryl at bedtime  . Allergy   . Anemia   . Arthritis    osteoarthritis  . Atrial fibrillation ([place]) 06-10-2011   takes Diltiazem and Pradaxa daily  . Cancer ([place])    skin  . Cataract    bilateral cataracts removed  . Clotting disorder ([place])    on PRADAXA - chronic a fib  .  Collagenous colitis    recurrent, takes Budesonide daily as needed   . Colon polyps 01/14/2010   Hyperplastic  . Cough    states every day of her life and every chest xray is always clear  . GERD (gastroesophageal reflux disease)    takes Omeprazole daily  . History of poliomyelitis    Polio  age 33- no significant neuromuscular deficit  . Hyperlipidemia    was on Pravastatin but joint pain;has been off for 2-31months ;takes CoQ10  . Hypertension    takes Bisoprolol and Chlorthalidone daily  . Potassium deficiency    takes KDUR daily    Patient's surgical history, family medical history, social history, medications and allergies were all reviewed in Epic   Creatinine clearance cannot be calculated (Patient's most recent lab result is older than the maximum 21 days allowed.)  Current Outpatient Medications  Medication Sig Dispense Refill  . apixaban (ELIQUIS) 5 MG TABS tablet Take 1 tablet (5 mg total) by mouth 2 (two) times daily. 60 tablet 3  . bisoprolol (ZEBETA) 10 MG tablet Take 1 tablet (10 mg total) by mouth daily. 90 tablet 3  . clindamycin (CLEOCIN) 300 MG capsule Take 2 capsules (600 mg total) by mouth daily as needed. PRN dental procedure 12 capsule 0  . diltiazem (CARDIZEM CD) 360 MG 24 hr capsule Take 1 capsule (360 mg total) by  mouth daily. 90 capsule 3  . Doxylamine Succinate, Sleep, (UNISOM PO) Take by mouth at bedtime as needed.    . ranitidine (ZANTAC) 150 MG tablet Take 1 tablet (150 mg total) by mouth 2 (two) times daily. 180 tablet 2  . spironolactone (ALDACTONE) 25 MG tablet Take 1 tablet (25 mg total) by mouth daily. 90 tablet 3   No current facility-administered medications for this visit.     Physical Exam:     BP 128/70   Pulse 62   Ht 5\' 6"  (1.676 m)   Wt 168 lb (76.2 kg)   BMI 27.12 kg/m   GENERAL:  Pleasant female in NAD PSYCH: : Cooperative, normal affect EENT:  conjunctiva pink, mucous membranes moist, neck supple without masses CARDIAC:   Regular rate, irreg rhythm, + BLE edema PULM: Normal respiratory effort, lungs CTA bilaterally, no wheezing ABDOMEN:  Nondistended, soft, nontender. No obvious masses, no hepatomegaly,  normal bowel sounds SKIN:  turgor, no lesions seen Musculoskeletal:  Normal muscle tone, normal strength NEURO: Alert and oriented x 3, no focal neurologic deficits   Tye Savoy , NP 08/23/2018, 2:29 PM

## 2018-08-23 NOTE — Patient Instructions (Signed)
If you are age 78 or older, your body mass index should be between 23-30. Your Body mass index is 27.12 kg/m. If this is out of the aforementioned range listed, please consider follow up with your Primary Care Provider.  If you are age 68 or younger, your body mass index should be between 19-25. Your Body mass index is 27.12 kg/m. If this is out of the aformentioned range listed, please consider follow up with your Primary Care Provider.   You have been scheduled for a colonoscopy. Please follow written instructions given to you at your visit today.  Please pick up your prep supplies at the pharmacy within the next 1-3 days. If you use inhalers (even only as needed), please bring them with you on the day of your procedure. Your physician has requested that you go to www.startemmi.com and enter the access code given to you at your visit today. This web site gives a general overview about your procedure. However, you should still follow specific instructions given to you by our office regarding your preparation for the procedure.  We have sent the following medications to your pharmacy for you to pick up at your convenience: [place] will be contacted by our office prior to your procedure for directions on holding your Eliquis.  If you do not hear from our office 1 week prior to your scheduled procedure, please call [phone] to discuss.   Thank you for choosing me and [facility].   Tye Savoy, NP

## 2018-08-24 NOTE — Progress Notes (Signed)
Reviewed and agree with documentation and assessment and plan. K. Veena Nandigam , MD   

## 2018-08-24 NOTE — Telephone Encounter (Signed)
Patient with diagnosis of afib on apixaban for anticoagulation.    Procedure: endoscopy Date of procedure: 09/20/18  CHADS2-VASc score of  5 (CHF, HTN, AGE, DM2, stroke/tia x 2, CAD, AGE, female)  Per office protocol, patient can hold apixaban for 2 days prior to procedure.

## 2018-08-25 NOTE — Telephone Encounter (Signed)
Spoke with patient today regarding holding her Eliquis 2 days prior to her procedure. Patient verbalized understanding to start holding on 09/18/18.

## 2018-08-28 NOTE — Telephone Encounter (Signed)
   Primary Cardiologist: [name], MD  Chart reviewed as part of pre-operative protocol coverage. Spoke with patient who affirms no new CV symptoms. Last OV 06/2018 reviewed, doing well. Given past medical history and time since last visit, based on ACC/AHA guidelines, Josely H Emmick would be at acceptable risk for the planned procedure without further cardiovascular testing.   GI team has already relayed pharm recommendations to patients; this note is just to signify medical clearance as well.  Charlie Pitter, PA-C 08/28/2018, 2:22 PM

## 2018-09-06 ENCOUNTER — Encounter: Payer: Self-pay | Admitting: Gastroenterology

## 2018-09-20 ENCOUNTER — Encounter: Payer: Medicare Other | Admitting: Gastroenterology

## 2018-10-24 ENCOUNTER — Ambulatory Visit (AMBULATORY_SURGERY_CENTER): Payer: Medicare Other | Admitting: Gastroenterology

## 2018-10-24 ENCOUNTER — Other Ambulatory Visit: Payer: Self-pay

## 2018-10-24 ENCOUNTER — Encounter: Payer: Self-pay | Admitting: Gastroenterology

## 2018-10-24 VITALS — BP 118/59 | HR 86 | Temp 99.1°F | Resp 15 | Ht 66.0 in | Wt 168.0 lb

## 2018-10-24 DIAGNOSIS — Z86010 Personal history of colonic polyps: Secondary | ICD-10-CM | POA: Diagnosis not present

## 2018-10-24 DIAGNOSIS — K635 Polyp of colon: Secondary | ICD-10-CM

## 2018-10-24 DIAGNOSIS — D120 Benign neoplasm of cecum: Secondary | ICD-10-CM

## 2018-10-24 MED ORDER — SODIUM CHLORIDE 0.9 % IV SOLN: 500.0000 mL | Freq: Once | INTRAVENOUS | Status: DC

## 2018-10-24 NOTE — Patient Instructions (Signed)
Thank you for allowing [place] to care for you today!  Resume previous diet and medications today.  Re-start Eliquis tomorrow at your normal dose.  Return to your normal activities tomorrow.  No repeat routine colonoscopy recommended due to age and current guidelines.     YOU HAD AN ENDOSCOPIC PROCEDURE TODAY AT [facility]:   Refer to the procedure report that was given to you for any specific questions about what was found during the examination.  If the procedure report does not answer your questions, please call your gastroenterologist to clarify.  If you requested that your care partner not be given the details of your procedure findings, then the procedure report has been included in a sealed envelope for you to review at your convenience later.  YOU SHOULD EXPECT: Some feelings of bloating in the abdomen. Passage of more gas than usual.  Walking can help get rid of the air that was put into your GI tract during the procedure and reduce the bloating. If you had a lower endoscopy (such as a colonoscopy or flexible sigmoidoscopy) you may notice spotting of blood in your stool or on the toilet paper. If you underwent a bowel prep for your procedure, you may not have a normal bowel movement for a few days.  Please Note:  You might notice some irritation and congestion in your nose or some drainage.  This is from the oxygen used during your procedure.  There is no need for concern and it should clear up in a day or so.  SYMPTOMS TO REPORT IMMEDIATELY:   Following lower endoscopy (colonoscopy or flexible sigmoidoscopy):  Excessive amounts of blood in the stool  Significant tenderness or worsening of abdominal pains  Swelling of the abdomen that is new, acute  Fever of 100F or higher   For urgent or emergent issues, a gastroenterologist can be reached at any hour by calling [phone].   DIET:  We do recommend a small meal at first, but then you may proceed to your  regular diet.  Drink plenty of fluids but you should avoid alcoholic beverages for 24 hours.  ACTIVITY:  You should plan to take it easy for the rest of today and you should NOT DRIVE or use heavy machinery until tomorrow (because of the sedation medicines used during the test).    FOLLOW UP: Our staff will call the number listed on your records the next business day following your procedure to check on you and address any questions or concerns that you may have regarding the information given to you following your procedure. If we do not reach you, we will leave a message.  However, if you are feeling well and you are not experiencing any problems, there is no need to return our call.  We will assume that you have returned to your regular daily activities without incident.  If any biopsies were taken you will be contacted by phone or by letter within the next 1-3 weeks.  Please call us at [phone] if you have not heard about the biopsies in 3 weeks.    SIGNATURES/CONFIDENTIALITY: You and/or your care partner have signed paperwork which will be entered into your electronic medical record.  These signatures attest to the fact that that the information above on your After Visit Summary has been reviewed and is understood.  Full responsibility of the confidentiality of this discharge information lies with you and/or your care-partner.

## 2018-10-24 NOTE — Progress Notes (Signed)
Called to room to assist during endoscopic procedure.  Patient ID and intended procedure confirmed with present staff. Received instructions for my participation in the procedure from the performing physician.  

## 2018-10-24 NOTE — Op Note (Addendum)
[place] Patient Name: [name] [name] Procedure Date: 10/24/2018 10:56 AM MRN: [id number] Endoscopist: Mauri Pole , MD Age: 78 Referring MD:  Date of Birth: 03-Apr-1941 Gender: Female Account #: [id number] Procedure:                Colonoscopy Indications:              High risk colon cancer surveillance: Personal                            history of colonic polyps, High risk colon cancer                            surveillance: Personal history of multiple (3 or                            more) adenomas Medicines:                Monitored Anesthesia Care Procedure:                Pre-Anesthesia Assessment:                           - Prior to the procedure, a History and Physical                            was performed, and patient medications and                            allergies were reviewed. The patient's tolerance of                            previous anesthesia was also reviewed. The risks                            and benefits of the procedure and the sedation                            options and risks were discussed with the patient.                            All questions were answered, and informed consent                            was obtained. Prior Anticoagulants: The patient                            last took Eliquis (apixaban) 2 days prior to the                            procedure. ASA Grade Assessment: III - A patient                            with severe systemic disease. After reviewing the  risks and benefits, the patient was deemed in                            satisfactory condition to undergo the procedure.                           After obtaining informed consent, the colonoscope                            was passed under direct vision. Throughout the                            procedure, the patient's blood pressure, pulse, and                            oxygen saturations were monitored continuously.  The                            Colonoscope was introduced through the anus and                            advanced to the the cecum, identified by                            appendiceal orifice and ileocecal valve. The                            colonoscopy was performed without difficulty. The                            patient tolerated the procedure well. The quality                            of the bowel preparation was good. The ileocecal                            valve, appendiceal orifice, and rectum were                            photographed. Scope In: 11:06:55 AM Scope Out: 11:21:34 AM Scope Withdrawal Time: 0 hours 9 minutes 57 seconds  Total Procedure Duration: 0 hours 14 minutes 39 seconds  Findings:                 The perianal and digital rectal examinations were                            normal.                           A 11 mm polyp was found in the cecum. The polyp was                            sessile. The polyp was removed with a cold snare.  Resection and retrieval were complete.                           Scattered small-mouthed diverticula were found in                            the sigmoid colon.                           Non-bleeding internal hemorrhoids were found during                            retroflexion. The hemorrhoids were small. Complications:            No immediate complications. Estimated Blood Loss:     Estimated blood loss was minimal. Impression:               - One 11 mm polyp in the cecum, removed with a cold                            snare. Resected and retrieved.                           - Diverticulosis in the sigmoid colon.                           - Non-bleeding internal hemorrhoids. Recommendation:           - Patient has a contact number available for                            emergencies. The signs and symptoms of potential                            delayed complications were discussed with the                             patient. Return to normal activities tomorrow.                            Written discharge instructions were provided to the                            patient.                           - Resume previous diet.                           - Continue present medications.                           - Await pathology results.                           - No repeat colonoscopy due to age.                           -  Resume Eliquis (apixaban) at prior dose tomorrow.                            Refer to managing physician for further adjustment                            of therapy. Mauri Pole, MD 10/24/2018 11:26:23 AM This report has been signed electronically.

## 2018-10-24 NOTE — Progress Notes (Signed)
Report given to PACU, vss 

## 2018-10-25 ENCOUNTER — Telehealth: Payer: Self-pay

## 2018-10-25 NOTE — Telephone Encounter (Signed)
  Follow up Call-  Call back number 10/24/2018 09/09/2016  Post procedure Call Back phone  # [phone] hm  Permission to leave phone message Yes Yes  Some recent data might be hidden     Patient questions:  Do you have a fever, pain , or abdominal swelling? No. Pain Score  0 *  Have you tolerated food without any problems? Yes.    Have you been able to return to your normal activities? Yes.    Do you have any questions about your discharge instructions: Diet   No. Medications  No. Follow up visit  No.  Do you have questions or concerns about your Care? No.  Actions: * If pain score is 4 or above: No action needed, pain <4.

## 2018-10-27 ENCOUNTER — Other Ambulatory Visit: Payer: Self-pay | Admitting: Adult Health

## 2018-10-27 NOTE — Telephone Encounter (Signed)
Rx(s) sent to pharmacy electronically.  

## 2018-10-30 ENCOUNTER — Encounter: Payer: Self-pay | Admitting: Gastroenterology

## 2018-11-14 ENCOUNTER — Other Ambulatory Visit: Payer: Self-pay | Admitting: Cardiology

## 2018-11-15 NOTE — Telephone Encounter (Signed)
Eliquis 5mg  refill request received; pt is 78 yrs old, wt-76.2kg, Crea-1.06 on 06/28/2018, last seen by Dr. Stanford Breed on 06/28/2018; will send in refill to requested pharmacy.

## 2018-12-06 ENCOUNTER — Ambulatory Visit (INDEPENDENT_AMBULATORY_CARE_PROVIDER_SITE_OTHER): Payer: Medicare Other | Admitting: Internal Medicine

## 2018-12-06 ENCOUNTER — Encounter: Payer: Self-pay | Admitting: Internal Medicine

## 2018-12-06 DIAGNOSIS — J329 Chronic sinusitis, unspecified: Secondary | ICD-10-CM

## 2018-12-06 DIAGNOSIS — R5383 Other fatigue: Secondary | ICD-10-CM | POA: Insufficient documentation

## 2018-12-06 MED ORDER — DOXYCYCLINE HYCLATE 100 MG PO TABS: 100.0000 mg | ORAL_TABLET | Freq: Two times a day (BID) | ORAL | 0 refills | Status: DC

## 2018-12-06 NOTE — Assessment & Plan Note (Signed)
Probable chronic sinus infection -partially treated Start doxy bid x 7 days Start claritin   Call if no improvement

## 2018-12-06 NOTE — Progress Notes (Signed)
Virtual Visit via Video Note  I connected with [name] [name] on 12/06/18 at  3:30 PM EDT by a video enabled telemedicine application and verified that I am speaking with the correct person using two identifiers.   I discussed the limitations of evaluation and management by telemedicine and the availability of in person appointments. The patient expressed understanding and agreed to proceed.  The patient is currently at home and I am in the office.    No referring provider.    History of Present Illness: This is an acute visit for not feeling well, ? Possible sinus infection.  She had a sinus infection about one months ago.  She had doxycycline at home and did end up taking 3 days worth and she thinks it did help.  She does not feel sick but just does not feel well.  She is unsure what to do - she just wants to feel better.    She has a rare throat, chronic PND and a cough that is worse with laying down. This is chronic, but ? Worse that usual.  She feels tired and SOB with going up stairs and exertion - it feels like when she had anemia in the past.  She has hot flushes.  She typically does not have seasonal allergies.  She denies signs of bleeding, blood in the stool.    She is taking care of her terminally ill husband at home and does not want to go anywhere.  She can not afford to get sick or get him sick.     Review of Systems  Constitutional: Positive for chills (mild) and malaise/fatigue. Negative for fever.       Hot flashes  HENT: Negative for ear pain and sinus pain.        Rare throat, chronic PND  Respiratory: Positive for cough (PND, tickle in throat) and shortness of breath (with stairs only and exertion). Negative for wheezing.   Cardiovascular: Negative for chest pain and palpitations.  Neurological: Negative for headaches.      Social History   Socioeconomic History  . Marital status: Married    Spouse name: Not on file  . Number of children: Not on file  . Years  of education: Not on file  . Highest education level: Not on file  Occupational History  . Not on file  Social Needs  . Financial resource strain: Not hard at all  . Food insecurity:    Worry: Never true    Inability: Never true  . Transportation needs:    Medical: No    Non-medical: No  Tobacco Use  . Smoking status: Former Smoker    Packs/day: 1.00    Years: 18.00    Pack years: 18.00    Types: Cigarettes    Last attempt to quit: 08/17/1987    Years since quitting: 31.3  . Smokeless tobacco: Never Used  . Tobacco comment: quit smoking 30yrs ago  Substance and Sexual Activity  . Alcohol use: Yes    Alcohol/week: 7.0 standard drinks    Types: 7 Glasses of wine per week    Comment: glass of wine daily  . Drug use: No  . Sexual activity: Not Currently    Partners: Male    Birth control/protection: Surgical  Lifestyle  . Physical activity:    Days per week: 0 days    Minutes per session: 0 min  . Stress: Very much  Relationships  . Social connections:    Talks on  phone: More than three times a week    Gets together: More than three times a week    Attends religious service: More than 4 times per year    Active member of club or organization: Not on file    Attends meetings of clubs or organizations: More than 4 times per year    Relationship status: Married  Other Topics Concern  . Not on file  Social History Narrative   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   Married     Former Smoker quit 1989    Alcohol use-yes -  glasses of wine/day      son works for [employer] - she flies to [place] to visit her grandchildren     Observations/Objective: Appears well in NAD Breathing normally  Assessment and Plan:  See Problem List for Assessment and Plan of chronic medical problems.   Follow Up Instructions:    I discussed the assessment and treatment plan with the patient. The patient was provided an opportunity to ask questions and all were answered. The patient  agreed with the plan and demonstrated an understanding of the instructions.   The patient was advised to call back or seek an in-person evaluation if the symptoms worsen or if the condition fails to improve as anticipated.    Binnie Rail, MD

## 2018-12-06 NOTE — Assessment & Plan Note (Signed)
She is feeling fatigued - ?cause May be chronic sinus infection/ partially treated sinus infection or may be other cause Discussed treating sinus infection, but if fatigue does not improve she will need to have labs done to r/o other causes -- she does not want avoid coming into doctor offices' if possible

## 2018-12-28 ENCOUNTER — Other Ambulatory Visit: Payer: Self-pay | Admitting: Adult Health

## 2018-12-28 DIAGNOSIS — I480 Paroxysmal atrial fibrillation: Secondary | ICD-10-CM

## 2019-01-17 NOTE — Progress Notes (Addendum)
Subjective:   [name] [name] is a 78 y.o. female who presents for Medicare Annual (Subsequent) preventive examination. I connected with patient by a telephone and verified that I am speaking with the correct person using two identifiers. Patient stated full name and DOB. Patient gave permission to continue with telephonic visit. Patient's location was at home and Nurse's location was at [place] office.   Review of Systems:  No ROS.  Medicare Wellness Virtual Visit.  Visual/audio telehealth visit, UTA vital signs.   See social history for additional risk factors. Cardiac Risk Factors include: advanced age (>60men, >57 women);dyslipidemia;hypertension Sleep patterns: does not get up to void, gets up 0-1 times nightly to void and sleeps 6-7 hours nightly.    Home Safety/Smoke Alarms: Feels safe in home. Smoke alarms in place.  Living environment; residence and Firearm Safety: 1-story house/ trailer.  Seat Belt Safety/Bike Helmet: Wears seat belt.     Objective:     Vitals: There were no vitals taken for this visit.  There is no height or weight on file to calculate BMI.  Advanced Directives 01/19/2019 01/04/2018 07/24/2017 06/15/2017 09/14/2016 09/04/2015 09/18/2013  Does Patient Have a Medical Advance Directive? Yes Yes Yes Yes Yes No Patient does not have advance directive;Patient would not like information  Type of Scientist, forensic Power of [place];Living will [place];Living will [place];Living will [place];Living will [place];Living will - -  Copy of [place] in Chart? No - copy requested No - copy requested No - copy requested - - - -  Would patient like information on creating a medical advance directive? - - No - Patient declined - - No - patient declined information -  Pre-existing out of facility DNR order (yellow form or pink MOST form) - - - - - - No    Tobacco Social  History   Tobacco Use  Smoking Status Former Smoker  . Packs/day: 1.00  . Years: 18.00  . Pack years: 18.00  . Types: Cigarettes  . Last attempt to quit: 08/17/1987  . Years since quitting: 31.4  Smokeless Tobacco Never Used  Tobacco Comment   quit smoking 61yrs ago     Counseling given: Not Answered Comment: quit smoking 23yrs ago  Past Medical History:  Diagnosis Date  . Allergic rhinitis    takes Benadryl at bedtime  . Allergy   . Anemia   . Arthritis    osteoarthritis  . Atrial fibrillation ([place]) 06-10-2011   takes Diltiazem and Pradaxa daily  . Cancer ([place])    skin  . Cataract    bilateral cataracts removed  . Clotting disorder ([place])    on PRADAXA - chronic a fib  . Collagenous colitis    recurrent, takes Budesonide daily as needed   . Colon polyps 01/14/2010   Hyperplastic  . Cough    states every day of her life and every chest xray is always clear  . GERD (gastroesophageal reflux disease)    takes Omeprazole daily  . History of poliomyelitis    Polio  age 27- no significant neuromuscular deficit  . Hyperlipidemia    was on Pravastatin but joint pain;has been off for 2-34months ;takes CoQ10  . Hypertension    takes Bisoprolol and Chlorthalidone daily  . Potassium deficiency    takes KDUR daily   Past Surgical History:  Procedure Laterality Date  . APPENDECTOMY     at age 31  .  BREAST BIOPSY  1947 & 10/07   Benign  . CARDIOVERSION N/A 11/05/2016   Procedure: CARDIOVERSION;  Surgeon: Sueanne Margarita, MD;  Location: [facility];  Service: Cardiovascular;  Laterality: N/A;  . cataracts removed    . COLONOSCOPY    . ENDOVENOUS ABLATION SAPHENOUS VEIN W/ LASER  03-03-2011 left greater saphenous vein   . ENDOVENOUS ABLATION SAPHENOUS VEIN W/ LASER  01-06-2011  right greater saphenous vein  . epidural infection     x 2   . IR RADIOLOGIST EVAL & MGMT  03/07/2017  . SKIN CANCER EXCISION     removal of skin cancer on left leg  . TONSILLECTOMY     as a child   . TOTAL KNEE ARTHROPLASTY Right 09/18/2013   Procedure: TOTAL KNEE ARTHROPLASTY;  Surgeon: Garald Balding, MD;  Location: [facility];  Service: Orthopedics;  Laterality: Right;   Family History  Problem Relation Age of Onset  . Stomach cancer Maternal Grandfather   . Heart disease Paternal Grandfather   . Colon cancer Son   . Rectal cancer Son 54  . Colon cancer Paternal Grandmother   . Alcohol abuse Father   . Heart disease Father   . Stroke Father   . Hypertension Father   . Hypertension Sister   . Other Sister        varicose veins  . Peripheral vascular disease Mother   . Other Mother        varicose veins  . Esophageal cancer Neg Hx   . Pancreatic cancer Neg Hx    Social History   Socioeconomic History  . Marital status: Married    Spouse name: Not on file  . Number of children: 2  . Years of education: Not on file  . Highest education level: Not on file  Occupational History  . Occupation: retired  Scientific laboratory technician  . Financial resource strain: Not hard at all  . Food insecurity:    Worry: Never true    Inability: Never true  . Transportation needs:    Medical: No    Non-medical: No  Tobacco Use  . Smoking status: Former Smoker    Packs/day: 1.00    Years: 18.00    Pack years: 18.00    Types: Cigarettes    Last attempt to quit: 08/17/1987    Years since quitting: 31.4  . Smokeless tobacco: Never Used  . Tobacco comment: quit smoking 18yrs ago  Substance and Sexual Activity  . Alcohol use: Yes    Alcohol/week: 7.0 standard drinks    Types: 7 Glasses of wine per week    Comment: glass of wine daily  . Drug use: No  . Sexual activity: Not Currently    Partners: Male    Birth control/protection: Surgical  Lifestyle  . Physical activity:    Days per week: 0 days    Minutes per session: 0 min  . Stress: To some extent  Relationships  . Social connections:    Talks on phone: More than three times a week    Gets together: More than three times a week     Attends religious service: More than 4 times per year    Active member of club or organization: Yes    Attends meetings of clubs or organizations: More than 4 times per year    Relationship status: Married  Other Topics Concern  . 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 bisoprolol (ZEBETA) 10 MG tablet Take 1 tablet (10 mg total) by mouth daily.  Marland Kitchen diltiazem (CARDIZEM CD) 360 MG 24 hr capsule TAKE (1) CAPSULE BY MOUTH ONCE DAILY.  Marland Kitchen Doxylamine Succinate, Sleep, (UNISOM PO) Take by mouth at bedtime as needed.  Marland Kitchen ELIQUIS 5 MG TABS tablet TAKE 1 TABLET BY MOUTH TWICE DAILY  . famotidine (PEPCID) 20 MG tablet Take 20 mg by mouth 2 (two) times daily as needed for heartburn or indigestion.  Marland Kitchen spironolactone (ALDACTONE) 25 MG tablet Take 1 tablet (25 mg total) by mouth daily.  . [DISCONTINUED] clindamycin (CLEOCIN) 300 MG capsule Take 2 capsules (600 mg total) by mouth daily as needed. PRN dental procedure (Patient not taking: Reported on 01/19/2019)  . [DISCONTINUED] doxycycline (VIBRA-TABS) 100 MG tablet Take 1 tablet (100 mg total) by mouth 2 (two) times daily. (Patient not taking: Reported on 01/19/2019)  . [DISCONTINUED] ranitidine (ZANTAC) 150 MG tablet Take 1 tablet (150 mg total) by mouth 2 (two) times daily. (Patient not taking: Reported on 01/19/2019)   No facility-administered encounter medications on file as of 01/19/2019.     Activities of Daily Living In your present state of health, do you have any difficulty performing the following activities: 01/19/2019  Hearing? N  Vision? N  Difficulty concentrating or making decisions? N  Walking or climbing stairs? N  Dressing or bathing? N  Doing errands, shopping? N   Preparing Food and eating ? N  Using the Toilet? N  In the past six months, have you accidently leaked urine? N  Do you have problems with loss of bowel control? N  Managing your Medications? N  Managing your Finances? N  Housekeeping or managing your Housekeeping? N  Some recent data might be hidden    Patient Care Team: Binnie Rail, MD as PCP - General (Internal Medicine) Stanford Breed Denice Bors, MD as PCP - Cardiology (Cardiology) Lafayette Dragon, MD (Inactive) (Gastroenterology) Garald Balding, MD (Orthopedic Surgery) Lelon Perla, MD (Cardiology)    Assessment:   This is a routine wellness examination for Brandin. Physical assessment deferred to PCP.  Exercise Activities and Dietary recommendations Current Exercise Habits: The patient does not participate in regular exercise at present, Exercise limited by: Other - see comments(fatigue)  Diet (meal preparation, eat out, water intake, caffeinated beverages, dairy products, fruits and vegetables): in general, a "healthy" diet  , well balanced   Reviewed heart healthy diet. Encouraged patient to increase daily water and healthy fluid intake.  Goals    . Patient Stated     I want to continue to take time for myself as I can, enjoy life and family.       Fall Risk Fall Risk  01/19/2019 01/04/2018 01/03/2017 10/08/2014  Falls in the past year? 0 Yes No Yes  Number falls in past yr: 0 2 or more - 1  Injury with Fall? - No - No  Risk for fall due to : - Impaired balance/gait;Impaired mobility - -  Follow up - Falls prevention discussed - -    Depression Screen PHQ 2/9 Scores 01/19/2019 01/04/2018 01/03/2017 10/08/2014  PHQ - 2 Score 1 1 0 1  PHQ- 9 Score - 2 - -     Cognitive Function MMSE - Mini Mental State Exam 01/04/2018  Orientation to time 5  Orientation to Place 5  Registration 3  Attention/ Calculation 5  Recall 3  Language- name 2 objects 2  Language- repeat 1  Language- follow 3 step command 3  Language- read  & follow direction 1  Write a sentence 1  Copy design 1  Total score 30       Ad8 score reviewed for issues:  Issues making decisions: no  Less interest in hobbies / activities: no  Repeats questions, stories (family complaining): no  Trouble using ordinary gadgets (microwave, computer, phone):no  Forgets the month or year: no  Mismanaging finances: no  Remembering appts: no  Daily problems with thinking and/or memory: no Ad8 score is= 0  Immunization History  Administered Date(s) Administered  . Influenza Split 06/29/2011, 06/09/2012  . Influenza Whole 06/05/2007, 06/04/2008, 04/27/2009  . Influenza, High Dose Seasonal PF 06/05/2018  . Influenza-Unspecified 05/16/2013, 06/17/2015, 06/15/2017  . Pneumococcal Conjugate-13 07/24/2013  . Pneumococcal Polysaccharide-23 04/23/2008  . Td 10/20/1998, 09/19/2009  . Zoster 01/25/2006   Screening Tests Health Maintenance  Topic Date Due  . DEXA SCAN  10/25/2005  . INFLUENZA VACCINE  03/17/2019  . TETANUS/TDAP  09/20/2019  . COLONOSCOPY  10/23/2021  . PNA vac Low Risk Adult  Completed      Plan:   Scheduled an appointment with PCP on 01/22/19 to discuss patient's fatigue and SOB with exertion.     Continue to eat heart healthy diet (full of fruits, vegetables, whole grains, lean protein, water--limit salt, fat, and sugar intake) and increase physical activity as tolerated.  I have personally reviewed and noted the following in the patient's chart:   . Medical and social history . Use of alcohol, tobacco or illicit drugs  . Current medications and supplements . Functional ability and status . Nutritional status . Physical activity . Advanced directives . List of other physicians . Screenings to include cognitive, depression, and falls . Referrals and appointments  In addition, I have reviewed and discussed with patient certain preventive protocols, quality metrics, and best practice recommendations. A written  personalized care plan for preventive services as well as general preventive health recommendations were provided to patient.     Michiel Cowboy, RN  01/19/2019    Medical screening examination/treatment/procedure(s) were performed by non-physician practitioner and as supervising physician I was immediately available for consultation/collaboration. I agree with above. Binnie Rail, MD

## 2019-01-19 ENCOUNTER — Ambulatory Visit (INDEPENDENT_AMBULATORY_CARE_PROVIDER_SITE_OTHER): Payer: Medicare Other | Admitting: *Deleted

## 2019-01-19 ENCOUNTER — Telehealth: Payer: Self-pay | Admitting: *Deleted

## 2019-01-19 DIAGNOSIS — Z0000 Encounter for general adult medical examination without abnormal findings: Secondary | ICD-10-CM

## 2019-01-19 NOTE — Telephone Encounter (Signed)
During AWV, patient stated that she is concerned about how fatigued she is feeling. Explaining that she does not have near the stamina she is use to and that she is also experiencing SOB with exertion. Patient discussed that her edema bilateral below the knee has been a little worse and that she gained approximately 5 pounds within about a weeks time. Nurse made an appointment on 01/22/19 with PCP for patient to discuss the above.

## 2019-01-20 DIAGNOSIS — R0609 Other forms of dyspnea: Secondary | ICD-10-CM | POA: Insufficient documentation

## 2019-01-20 NOTE — Progress Notes (Signed)
Virtual Visit via Video Note  I connected with [name] [name] on 01/22/19 at  1:45 PM EDT by a video enabled telemedicine application and verified that I am speaking with the correct person using two identifiers.   I discussed the limitations of evaluation and management by telemedicine and the availability of in person appointments. The patient expressed understanding and agreed to proceed.  The patient is currently at home and I am in the office.    No referring provider.    History of Present Illness: This is an acute visit for fatigue, SOB with exertion, increased b/l leg edema, weight gain of 5 lbs  She states the above symptoms are more chronic and gradual than acute.  She has had increased leg swelling over the past year-there has been no change recently.  She states shortness of breath with exertion over the past few months.  It tends to be worse at the end of the day.  She states lack of stamina and fatigue.  She has been compliant with a low-sodium diet.  She has noticed hot flashes 3-4 times a week, which is new.  She has a cough, which is related to postnasal drip.  She denies any wheezing.  She has not had any chest pain or palpitations.  She has occasional lightheadedness if she stands up too quick.  She wanted to make sure she was not ignoring her symptoms because her husband relies on hydration care of him.  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  She has an appointment with cardiology later this month.  She is taking all of her medications as prescribed.  At one point she was on furosemide, but she did not think it was doing anything for leg edema and she ended up stopping this.  She wondered if she should consider restarting this.  Echo 06/2016:  EF 50-55%, unable to evaluated LV diastolic dysfunction, mod MR, pulm htn         Review of Systems  Constitutional: Positive for malaise/fatigue. Negative for chills and fever.       Hot  flashes 3-4 times a week - this is new, decreased stamina  Respiratory: Positive for cough (related to PND) and shortness of breath (with climbing stairs or longer walks). Negative for wheezing.   Cardiovascular: Positive for leg swelling (increased over the past year - not worse recently). Negative for chest pain and palpitations.  Neurological: Negative for headaches.       Occ lightheadedness when she stands up too fast     Social History   Socioeconomic History  . Marital status: Married    Spouse name: Not on file  . Number of children: 2  . Years of education: Not on file  . Highest education level: Not on file  Occupational History  . Occupation: retired  Scientific laboratory technician  . Financial resource strain: Not hard at all  . Food insecurity:    Worry: Never true    Inability: Never true  . Transportation needs:    Medical: No    Non-medical: No  Tobacco Use  . Smoking status: Former Smoker    Packs/day: 1.00    Years: 18.00    Pack years: 18.00    Types: Cigarettes    Last attempt to quit: 08/17/1987    Years since quitting: 31.4  . Smokeless tobacco: Never Used  . Tobacco comment: quit smoking 32yrs ago  Substance and Sexual Activity  .  Alcohol use: Yes    Alcohol/week: 7.0 standard drinks    Types: 7 Glasses of wine per week    Comment: glass of wine daily  . Drug use: No  . Sexual activity: Not Currently    Partners: Male    Birth control/protection: Surgical  Lifestyle  . Physical activity:    Days per week: 0 days    Minutes per session: 0 min  . Stress: To some extent  Relationships  . Social connections:    Talks on phone: More than three times a week    Gets together: More than three times a week    Attends religious service: More than 4 times per year    Active member of club or organization: Yes    Attends meetings of clubs or organizations: More than 4 times per year    Relationship status: Married  Other Topics Concern  . 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   Follow Up Instructions:    I discussed the assessment and treatment plan with the patient. The patient was provided an opportunity to ask questions and all were answered. The patient agreed with the plan and demonstrated an understanding of the instructions.   The patient was advised to call back or seek an in-person evaluation if the symptoms worsen or if the condition fails to improve as anticipated.    Binnie Rail, MD

## 2019-01-20 NOTE — Telephone Encounter (Signed)
noted 

## 2019-01-22 ENCOUNTER — Ambulatory Visit (INDEPENDENT_AMBULATORY_CARE_PROVIDER_SITE_OTHER): Payer: Medicare Other | Admitting: Internal Medicine

## 2019-01-22 ENCOUNTER — Encounter: Payer: Self-pay | Admitting: Internal Medicine

## 2019-01-22 ENCOUNTER — Other Ambulatory Visit: Payer: Self-pay

## 2019-01-22 DIAGNOSIS — E782 Mixed hyperlipidemia: Secondary | ICD-10-CM

## 2019-01-22 DIAGNOSIS — R0609 Other forms of dyspnea: Secondary | ICD-10-CM

## 2019-01-22 DIAGNOSIS — I10 Essential (primary) hypertension: Secondary | ICD-10-CM | POA: Diagnosis not present

## 2019-01-22 DIAGNOSIS — R739 Hyperglycemia, unspecified: Secondary | ICD-10-CM | POA: Diagnosis not present

## 2019-01-22 DIAGNOSIS — R0602 Shortness of breath: Secondary | ICD-10-CM | POA: Insufficient documentation

## 2019-01-22 DIAGNOSIS — R5383 Other fatigue: Secondary | ICD-10-CM

## 2019-01-22 MED ORDER — FUROSEMIDE 20 MG PO TABS: 20.0000 mg | ORAL_TABLET | Freq: Every day | ORAL | 1 refills | Status: DC

## 2019-01-22 NOTE — Assessment & Plan Note (Signed)
A1c

## 2019-01-22 NOTE — Assessment & Plan Note (Signed)
Has not tolerated statins We will check lipid panel since she is coming in for blood work

## 2019-01-22 NOTE — Assessment & Plan Note (Signed)
BP Readings from Last 3 Encounters:  10/24/18 (!) 118/59  08/23/18 128/70  06/28/18 102/68   Blood pressure has typically been well controlled Continue current medications

## 2019-01-22 NOTE — Assessment & Plan Note (Signed)
Experiencing shortness of breath with exertion-this has been noticeable over the last few months Also has had some weight gain, chronic leg edema and fatigue ?  Related to valvular disease, pulmonary hypertension, possible diastolic heart failure, fluid overload, cardiac ischemia Has an appointment with cardiology coming up Check chest x-ray, CBC, CMP, TSH, BNP Will restart furosemide 20 mg daily to see if this helps with her leg edema and shortness of breath

## 2019-01-22 NOTE — Assessment & Plan Note (Signed)
States fatigue, which she has complained of in the past ?  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

## 2019-01-25 ENCOUNTER — Telehealth: Payer: Self-pay | Admitting: Internal Medicine

## 2019-01-25 ENCOUNTER — Ambulatory Visit (INDEPENDENT_AMBULATORY_CARE_PROVIDER_SITE_OTHER)
Admission: RE | Admit: 2019-01-25 | Discharge: 2019-01-25 | Disposition: A | Discharge status: Home / Self Care | Payer: Medicare Other | Source: Ambulatory Visit | Attending: Internal Medicine | Admitting: Internal Medicine

## 2019-01-25 ENCOUNTER — Other Ambulatory Visit: Payer: Self-pay

## 2019-01-25 ENCOUNTER — Other Ambulatory Visit (INDEPENDENT_AMBULATORY_CARE_PROVIDER_SITE_OTHER): Payer: Medicare Other

## 2019-01-25 DIAGNOSIS — E782 Mixed hyperlipidemia: Secondary | ICD-10-CM | POA: Diagnosis not present

## 2019-01-25 DIAGNOSIS — R5383 Other fatigue: Secondary | ICD-10-CM

## 2019-01-25 DIAGNOSIS — I10 Essential (primary) hypertension: Secondary | ICD-10-CM

## 2019-01-25 DIAGNOSIS — R0609 Other forms of dyspnea: Secondary | ICD-10-CM

## 2019-01-25 LAB — CBC WITH DIFFERENTIAL/PLATELET
Basophils Absolute: 0.1 10*3/uL (ref 0.0–0.1)
Basophils Relative: 0.9 % (ref 0.0–3.0)
Eosinophils Absolute: 0.1 10*3/uL (ref 0.0–0.7)
Eosinophils Relative: 1.7 % (ref 0.0–5.0)
HCT: 39.7 % (ref 36.0–46.0)
Hemoglobin: 13.3 g/dL (ref 12.0–15.0)
Lymphocytes Relative: 26.9 % (ref 12.0–46.0)
Lymphs Abs: 2 10*3/uL (ref 0.7–4.0)
MCHC: 33.5 g/dL (ref 30.0–36.0)
MCV: 100.9 fl — ABNORMAL HIGH (ref 78.0–100.0)
Monocytes Absolute: 0.8 10*3/uL (ref 0.1–1.0)
Monocytes Relative: 11 % (ref 3.0–12.0)
Neutro Abs: 4.4 10*3/uL (ref 1.4–7.7)
Neutrophils Relative %: 59.5 % (ref 43.0–77.0)
Platelets: 257 10*3/uL (ref 150.0–400.0)
RBC: 3.94 Mil/uL (ref 3.87–5.11)
RDW: 13.6 % (ref 11.5–15.5)
WBC: 7.4 10*3/uL (ref 4.0–10.5)

## 2019-01-25 LAB — COMPREHENSIVE METABOLIC PANEL
ALT: 13 U/L (ref 0–35)
AST: 16 U/L (ref 0–37)
Albumin: 4.4 g/dL (ref 3.5–5.2)
Alkaline Phosphatase: 67 U/L (ref 39–117)
BUN: 28 mg/dL — ABNORMAL HIGH (ref 6–23)
CO2: 28 mEq/L (ref 19–32)
Calcium: 9.5 mg/dL (ref 8.4–10.5)
Chloride: 101 mEq/L (ref 96–112)
Creatinine, Ser: 1.05 mg/dL (ref 0.40–1.20)
GFR: 50.65 mL/min — ABNORMAL LOW (ref >60.00)
Glucose, Bld: 117 mg/dL — ABNORMAL HIGH (ref 70–99)
Potassium: 4.7 mEq/L (ref 3.5–5.1)
Sodium: 137 mEq/L (ref 135–145)
Total Bilirubin: 0.6 mg/dL (ref 0.2–1.2)
Total Protein: 7.3 g/dL (ref 6.0–8.3)

## 2019-01-25 LAB — TSH: TSH: 1.42 u[IU]/mL (ref 0.35–4.50)

## 2019-01-25 LAB — LIPID PANEL
Cholesterol: 240 mg/dL — ABNORMAL HIGH (ref 0–200)
HDL: 60.1 mg/dL (ref >39.00)
LDL Cholesterol: 152 mg/dL — ABNORMAL HIGH (ref 0–99)
NonHDL: 179.72
Total CHOL/HDL Ratio: 4
Triglycerides: 137 mg/dL (ref 0.0–149.0)
VLDL: 27.4 mg/dL (ref 0.0–40.0)

## 2019-01-25 LAB — BRAIN NATRIURETIC PEPTIDE: Pro B Natriuretic peptide (BNP): 469 pg/mL — ABNORMAL HIGH (ref 0.0–100.0)

## 2019-01-25 NOTE — Telephone Encounter (Signed)
Kidney function is decreased but stable.  Liver tests are normal.  Cholesterol is higher than previous.  Blood counts are normal.  Thyroid function is normal.     A Blood test does show that she is fluid overloaded and the lasix should help.  Has she seen a difference in her leg swelling and shortness of breath?  She should continue the lasix daily.  I think we should repeat labs in about one week if she can to recheck her kidney function and measure of fluid overload ( bmp, bnp)

## 2019-01-26 NOTE — Telephone Encounter (Signed)
Normal.  She does need to contact cardiology.  She can take 2 lasix daily for two days to see if that helps and then go back to one pill daily

## 2019-01-26 NOTE — Telephone Encounter (Signed)
LVM for pt to call back in regards to message below.  

## 2019-01-26 NOTE — Telephone Encounter (Signed)
Pt aware of response.  

## 2019-01-26 NOTE — Telephone Encounter (Signed)
States it is hard to tell if the swelling and SOB is better.  Wants to know about chest xray results.

## 2019-01-29 ENCOUNTER — Other Ambulatory Visit (INDEPENDENT_AMBULATORY_CARE_PROVIDER_SITE_OTHER): Payer: Medicare Other

## 2019-01-29 ENCOUNTER — Telehealth: Payer: Self-pay | Admitting: Internal Medicine

## 2019-01-29 DIAGNOSIS — I10 Essential (primary) hypertension: Secondary | ICD-10-CM | POA: Diagnosis not present

## 2019-01-29 LAB — BASIC METABOLIC PANEL
BUN: 31 mg/dL — ABNORMAL HIGH (ref 6–23)
CO2: 29 mEq/L (ref 19–32)
Calcium: 9.8 mg/dL (ref 8.4–10.5)
Chloride: 99 mEq/L (ref 96–112)
Creatinine, Ser: 0.96 mg/dL (ref 0.40–1.20)
GFR: 56.17 mL/min — ABNORMAL LOW (ref >60.00)
Glucose, Bld: 113 mg/dL — ABNORMAL HIGH (ref 70–99)
Potassium: 4.3 mEq/L (ref 3.5–5.1)
Sodium: 136 mEq/L (ref 135–145)

## 2019-01-29 LAB — BRAIN NATRIURETIC PEPTIDE: Pro B Natriuretic peptide (BNP): 487 pg/mL — ABNORMAL HIGH (ref 0.0–100.0)

## 2019-01-29 LAB — HM MAMMOGRAPHY

## 2019-01-29 NOTE — Telephone Encounter (Signed)
Her blood work shows slight improvement in her kidney function, but still appears to be fluid overloaded.  How is her shortness of breath and leg edema?

## 2019-01-30 ENCOUNTER — Telehealth: Payer: Self-pay | Admitting: Cardiology

## 2019-01-30 NOTE — Telephone Encounter (Signed)
For the fluid pill -- Have her take 2 tabs in the morning x 3 days and then return to 1 tab daily.

## 2019-01-30 NOTE — Telephone Encounter (Signed)
Pt aware of response and expressed understanding.  

## 2019-01-30 NOTE — Telephone Encounter (Signed)
smartphone/ consent/ my chart active/ pre reg completed °

## 2019-01-30 NOTE — Telephone Encounter (Signed)
Leg edema has not gotten much better. SOB is a little better. Will be following up with cardiology on Friday to address issues. Any changes to fluid pill or stay the same or wait to see what cardiology does?

## 2019-01-31 ENCOUNTER — Encounter: Payer: Self-pay | Admitting: Internal Medicine

## 2019-01-31 NOTE — Progress Notes (Signed)
Virtual Visit via Video Note   This visit type was conducted due to national recommendations for restrictions regarding the COVID-19 Pandemic (e.g. social distancing) in an effort to limit this patient's exposure and mitigate transmission in our community.  Due to her co-morbid illnesses, this patient is at least at moderate risk for complications without adequate follow up.  This format is felt to be most appropriate for this patient at this time.  All issues noted in this document were discussed and addressed.  A limited physical exam was performed with this format.  Please refer to the patient's chart for her consent to telehealth for [facility].   Date:  02/02/2019   ID:  [name] [name], DOB 1941/07/02, MRN [id number]  Patient Location: Home Provider Location: Home  PCP:  Binnie Rail, MD  Cardiologist:  [name], MD  Evaluation Performed:  Follow-Up Visit  Chief Complaint:  FU Atrial fibrillation  History of Present Illness:    FU AFib. Patient had episode of atrial fibrillationin [place] in Oct 2012; treated with cardizem and converted spontaneously. Nuclear studyNov 2012 showed EF 69 and normal perfusion. Echocardiogram November 2017 showed ejection fraction 50-55%, mild left ventricular hypertrophy, mild aortic and mitral regurgitation, mild left atrial enlargement and moderate tricuspid regurgitation. Noted to be in recurrent atrial fibrillation at previous ov. Holter monitor December 2017 showed atrial fibrillation with rate mildly elevated. Patient had successful cardioversion on 11/05/2016. Following cardioversion she was found to be bradycardic and her medications were decreased.When I saw her in April 2018 she had developed recurrent atrial fibrillation and we elected rate control.Since I last saw her,she has some dyspnea with more vigorous activities.  No orthopnea, PND, chest pain, palpitations or syncope.  She has had mild pedal edema and her Lasix was increased  recently by primary care with some improvement.  She has lost 6 pounds since then.  The patient does not have symptoms concerning for COVID-19 infection (fever, chills, cough, or new shortness of breath).    Past Medical History:  Diagnosis Date  . Allergic rhinitis    takes Benadryl at bedtime  . Allergy   . Anemia   . Arthritis    osteoarthritis  . Atrial fibrillation ([place]) 06-10-2011   takes Diltiazem and Pradaxa daily  . Cancer ([place])    skin  . Cataract    bilateral cataracts removed  . Clotting disorder ([place])    on PRADAXA - chronic a fib  . Collagenous colitis    recurrent, takes Budesonide daily as needed   . Colon polyps 01/14/2010   Hyperplastic  . Cough    states every day of her life and every chest xray is always clear  . GERD (gastroesophageal reflux disease)    takes Omeprazole daily  . History of poliomyelitis    Polio  age 41- no significant neuromuscular deficit  . Hyperlipidemia    was on Pravastatin but joint pain;has been off for 2-82months ;takes CoQ10  . Hypertension    takes Bisoprolol and Chlorthalidone daily  . Potassium deficiency    takes KDUR daily   Past Surgical History:  Procedure Laterality Date  . APPENDECTOMY     at age 89  . BREAST BIOPSY  1947 & 10/07   Benign  . CARDIOVERSION N/A 11/05/2016   Procedure: CARDIOVERSION;  Surgeon: Sueanne Margarita, MD;  Location: [facility];  Service: Cardiovascular;  Laterality: N/A;  . cataracts removed    . COLONOSCOPY    . ENDOVENOUS ABLATION  SAPHENOUS VEIN W/ LASER  03-03-2011 left greater saphenous vein   . ENDOVENOUS ABLATION SAPHENOUS VEIN W/ LASER  01-06-2011  right greater saphenous vein  . epidural infection     x 2   . IR RADIOLOGIST EVAL & MGMT  03/07/2017  . SKIN CANCER EXCISION     removal of skin cancer on left leg  . TONSILLECTOMY     as a child  . TOTAL KNEE ARTHROPLASTY Right 09/18/2013   Procedure: TOTAL KNEE ARTHROPLASTY;  Surgeon: Garald Balding, MD;  Location: [place];   Service: Orthopedics;  Laterality: Right;     Current Meds  Medication Sig  . bisoprolol (ZEBETA) 10 MG tablet Take 1 tablet (10 mg total) by mouth daily.  Marland Kitchen diltiazem (CARDIZEM CD) 360 MG 24 hr capsule TAKE (1) CAPSULE BY MOUTH ONCE DAILY.  Marland Kitchen Doxylamine Succinate, Sleep, (UNISOM PO) Take by mouth at bedtime as needed.  Marland Kitchen ELIQUIS 5 MG TABS tablet TAKE 1 TABLET BY MOUTH TWICE DAILY  . famotidine (PEPCID) 20 MG tablet Take 20 mg by mouth daily.   . furosemide (LASIX) 20 MG tablet Take 1 tablet (20 mg total) by mouth daily. (Patient taking differently: Take 20 mg by mouth 2 (two) times daily. )  . spironolactone (ALDACTONE) 25 MG tablet Take 1 tablet (25 mg total) by mouth daily.     Allergies:   Augmentin [amoxicillin-pot clavulanate], Levaquin [levofloxacin in d5w], Polysporin [bacitracin-polymyxin b], Pravastatin, Sulfonamide derivatives, and Erythromycin   Social History   Tobacco Use  . Smoking status: Former Smoker    Packs/day: 1.00    Years: 18.00    Pack years: 18.00    Types: Cigarettes    Quit date: 08/17/1987    Years since quitting: 31.4  . Smokeless tobacco: Never Used  . Tobacco comment: quit smoking 30yrs ago  Substance Use Topics  . Alcohol use: Yes    Alcohol/week: 7.0 standard drinks    Types: 7 Glasses of wine per week    Comment: glass of wine daily  . Drug use: No     Family Hx: The patient's family history includes Alcohol abuse in her father; Colon cancer in her paternal grandmother and son; Heart disease in her father and paternal grandfather; Hypertension in her father and sister; Other in her mother and sister; Peripheral vascular disease in her mother; Rectal cancer (age of onset: 29) in her son; Stomach cancer in her maternal grandfather; Stroke in her father. There is no history of Esophageal cancer or Pancreatic cancer.  ROS:   Please see the history of present illness.    No fevers, chills or productive cough.  She does have some fatigue. All other  systems reviewed and are negative.  Recent Labs: 01/25/2019: ALT 13; Hemoglobin 13.3; Platelets 257.0; TSH 1.42 01/29/2019: BUN 31; Creatinine, Ser 0.96; Potassium 4.3; Pro B Natriuretic peptide (BNP) 487.0; Sodium 136   Recent Lipid Panel Lab Results  Component Value Date/Time   CHOL 240 (H) 01/25/2019 02:14 PM   TRIG 137.0 01/25/2019 02:14 PM   HDL 60.10 01/25/2019 02:14 PM   CHOLHDL 4 01/25/2019 02:14 PM   LDLCALC 152 (H) 01/25/2019 02:14 PM   LDLDIRECT 128.6 12/09/2010 11:41 AM    Wt Readings from Last 3 Encounters:  02/02/19 163 lb (73.9 kg)  10/24/18 168 lb (76.2 kg)  08/23/18 168 lb (76.2 kg)     Objective:    Vital Signs:  BP 130/74   Ht 5' 5.5" (1.664 m)   [facility]  163 lb (73.9 kg)   BMI 26.71 kg/m    VITAL SIGNS:  reviewed  No acute distress Answers questions appropriately Normal affect Remainder physical examination not performed (telehealth visit; coronavirus pandemic)  ASSESSMENT & PLAN:    1. Permanent atrial fibrillation-as outlined in previous notes our plan is rate control and anticoagulation.  She does have some fatigue and dyspnea on exertion but this is more chronic.  We did cardiovert her previously and she held sinus for 1 week and states her symptoms were unchanged during that week.  We will continue Cardizem and bisoprolol at present dose.  Continue apixaban.   2. Hypertension-patient's blood pressure is controlled.  Continue present medications and follow. 3. Hyperlipidemia-patient is intolerant to statins.  We will continue with diet.  She does not have a history of vascular disease. 4. Chronic pedal edema-continue present dose of diuretic.  We discussed the importance of fluid restriction and low-sodium diet.  I have asked her to keep feet elevated.  I will plan to repeat her echocardiogram as well.  COVID-19 Education: The importance of social distancing was discussed today.  Time:   Today, I have spent 17 minutes with the patient with telehealth  technology discussing the above problems.     Medication Adjustments/Labs and Tests Ordered: Current medicines are reviewed at length with the patient today.  Concerns regarding medicines are outlined above.   Tests Ordered: No orders of the defined types were placed in this encounter.   Medication Changes: No orders of the defined types were placed in this encounter.   Follow Up:  Virtual Visit or In Person in 3 month(s)  Signed, [name], MD  02/02/2019 10:14 AM    [place]

## 2019-02-01 ENCOUNTER — Telehealth: Payer: Self-pay | Admitting: Emergency Medicine

## 2019-02-01 NOTE — Telephone Encounter (Signed)
Copied from [place] [phone]. Topic: General - Inquiry >> Feb 01, 2019  3:14 PM Alanda Slim E wrote: Reason for CRM: Pt has an appt with her cardiologist in the morning and would like her lab results from 6.15.20 posted to her my chart so that she can have them for her appt. Derrek [place]aco advise

## 2019-02-01 NOTE — Telephone Encounter (Signed)
[name] [name] is in our system and can see all of her labs

## 2019-02-01 NOTE — Telephone Encounter (Signed)
Please advise 

## 2019-02-01 NOTE — Telephone Encounter (Signed)
Spoke with pt to inform.  

## 2019-02-02 ENCOUNTER — Encounter: Payer: Self-pay | Admitting: Cardiology

## 2019-02-02 ENCOUNTER — Telehealth (INDEPENDENT_AMBULATORY_CARE_PROVIDER_SITE_OTHER): Payer: Medicare Other | Admitting: Cardiology

## 2019-02-02 ENCOUNTER — Telehealth: Payer: Self-pay | Admitting: Cardiology

## 2019-02-02 VITALS — BP 130/74 | Ht 65.5 in | Wt 163.0 lb

## 2019-02-02 DIAGNOSIS — R609 Edema, unspecified: Secondary | ICD-10-CM | POA: Diagnosis not present

## 2019-02-02 DIAGNOSIS — I4821 Permanent atrial fibrillation: Secondary | ICD-10-CM

## 2019-02-02 DIAGNOSIS — I10 Essential (primary) hypertension: Secondary | ICD-10-CM | POA: Diagnosis not present

## 2019-02-02 DIAGNOSIS — I480 Paroxysmal atrial fibrillation: Secondary | ICD-10-CM

## 2019-02-02 MED ORDER — DILTIAZEM HCL ER COATED BEADS 360 MG PO CP24: ORAL_CAPSULE | ORAL | 1 refills | Status: DC

## 2019-02-02 MED ORDER — BISOPROLOL FUMARATE 10 MG PO TABS: 10.0000 mg | ORAL_TABLET | Freq: Every day | ORAL | 1 refills | Status: DC

## 2019-02-02 MED ORDER — SPIRONOLACTONE 25 MG PO TABS: 25.0000 mg | ORAL_TABLET | Freq: Every day | ORAL | 1 refills | Status: DC

## 2019-02-02 NOTE — Telephone Encounter (Signed)
The patient called back to go over instructions from her visit today. She has been informed that someone from scheduling will call her to schedule her echo.  Refills have been sent in per her request.

## 2019-02-02 NOTE — Telephone Encounter (Signed)
New Message     Pt is calling to speak with Hilda Blades    Please call back

## 2019-02-02 NOTE — Patient Instructions (Signed)
Medication Instructions:  NO CHANGE If you need a refill on your cardiac medications before your next appointment, please call your pharmacy.   Lab work: If you have labs (blood work) drawn today and your tests are completely normal, you will receive your results only by: . MyChart Message (if you have MyChart) OR . A paper copy in the mail If you have any lab test that is abnormal or we need to change your treatment, we will call you to review the results.  Testing/Procedures: Your physician has requested that you have an echocardiogram. Echocardiography is a painless test that uses sound waves to create images of your heart. It provides your doctor with information about the size and shape of your heart and how well your heart's chambers and valves are working. This procedure takes approximately one hour. There are no restrictions for this procedure.  [address]  Follow-Up: At [facility], you and your health needs are our priority.  As part of our continuing mission to provide you with exceptional heart care, we have created designated Provider Care Teams.  These Care Teams include your primary Cardiologist (physician) and Advanced Practice Providers (APPs -  Physician Assistants and Nurse Practitioners) who all work together to provide you with the care you need, when you need it. Your physician recommends that you schedule a follow-up appointment in: 3 MONTHS WITH DR CRENSHAW   

## 2019-02-05 ENCOUNTER — Telehealth: Payer: Medicare Other | Admitting: Cardiology

## 2019-02-06 ENCOUNTER — Telehealth (HOSPITAL_COMMUNITY): Payer: Self-pay | Admitting: Radiology

## 2019-02-06 NOTE — Telephone Encounter (Signed)

## 2019-02-07 ENCOUNTER — Other Ambulatory Visit: Payer: Self-pay

## 2019-02-07 ENCOUNTER — Ambulatory Visit (HOSPITAL_COMMUNITY): Payer: Medicare Other | Attending: Cardiology

## 2019-02-07 DIAGNOSIS — I4821 Permanent atrial fibrillation: Secondary | ICD-10-CM | POA: Diagnosis not present

## 2019-02-22 IMAGING — MR MR LUMBAR SPINE W/O CM
4 of 5 series · 18 of 48 positions shown · non-contrast
Comparison: Radiography 02/02/2017.  MRI 05/27/2010.

CLINICAL DATA: Left low back pain and leg pain after falling in [REDACTED]

EXAM:
MRI LUMBAR SPINE WITHOUT CONTRAST
TECHNIQUE: Multiplanar, multisequence MR imaging of the lumbar spine was
performed. No intravenous contrast was administered.

[Series 6: T2 · sagittal · 4.0mm · 0.73mm/px · 6 of 17 slices shown (1 of 2)]
[im 1/17]
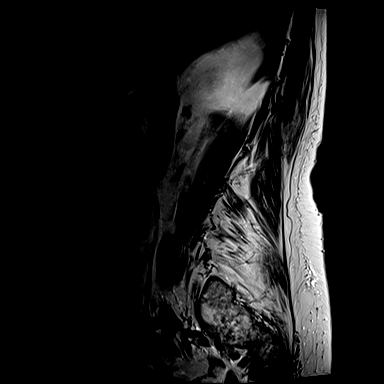
[im 4/17]
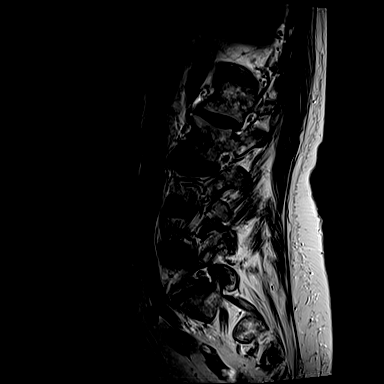
[im 7/17]
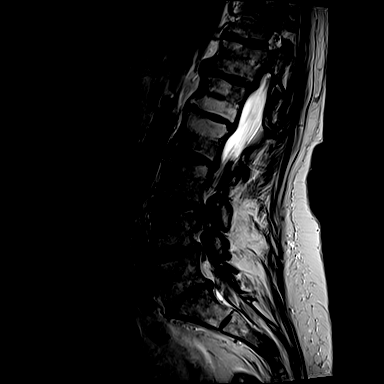
[im 10/17]
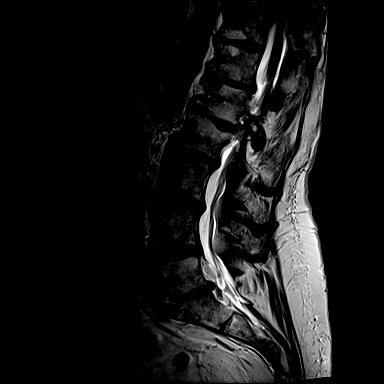
[im 13/17]
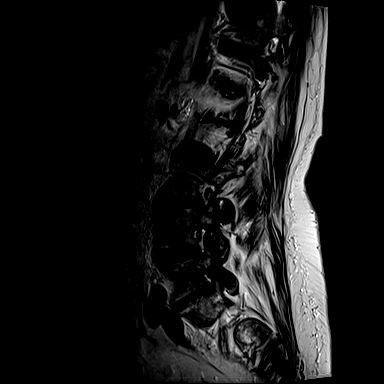
[im 17/17]
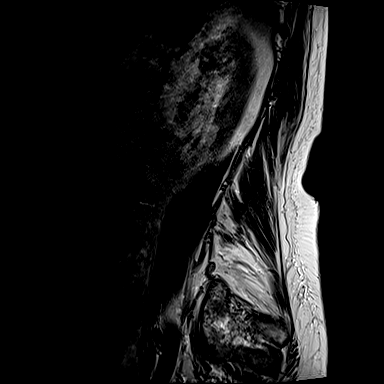

[Series 7: T1 · sagittal · 4.0mm · 0.73mm/px · 3 of 17 slices shown (1 of 2)]
[im 4/17]
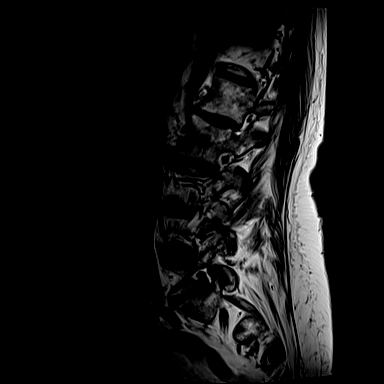
[im 10/17]
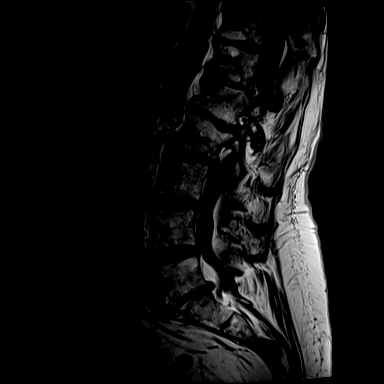
[im 17/17]
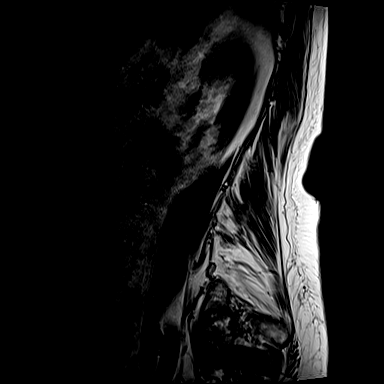

[Series 13: T2 · axial · 4.0mm · 0.28mm/px · z∈[-16,+198]mm · 6 of 43 slices shown (2 of 2)]
[im 1/43]
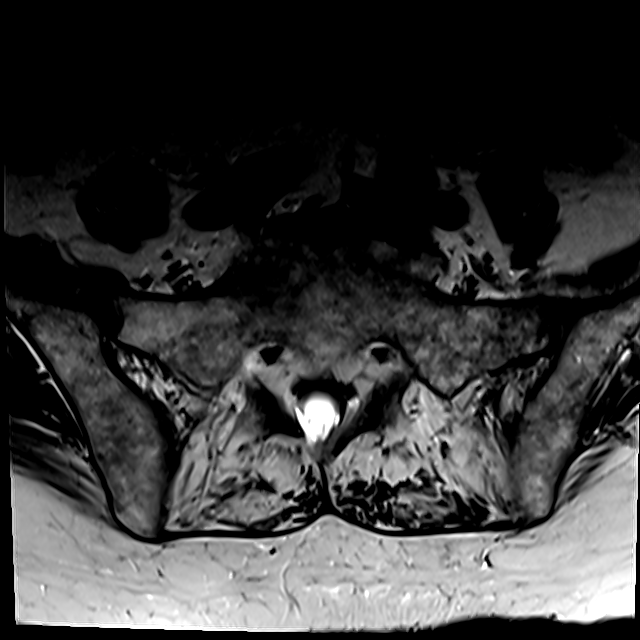
[im 7/43]
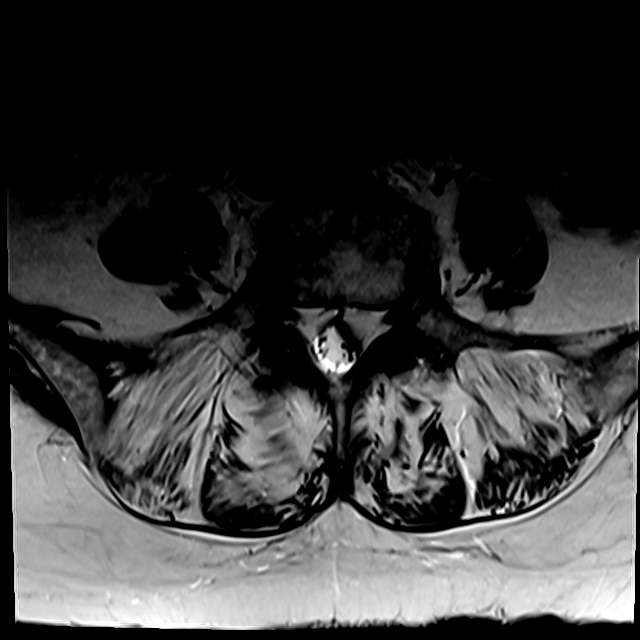
[im 13/43]
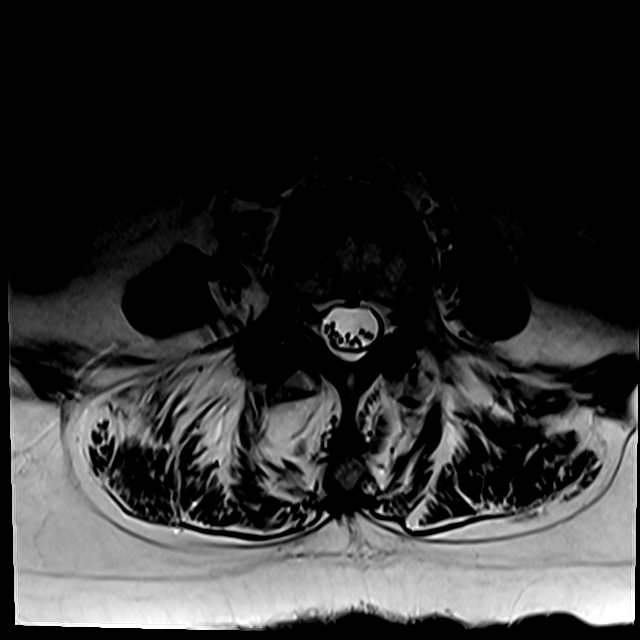
[im 19/43]
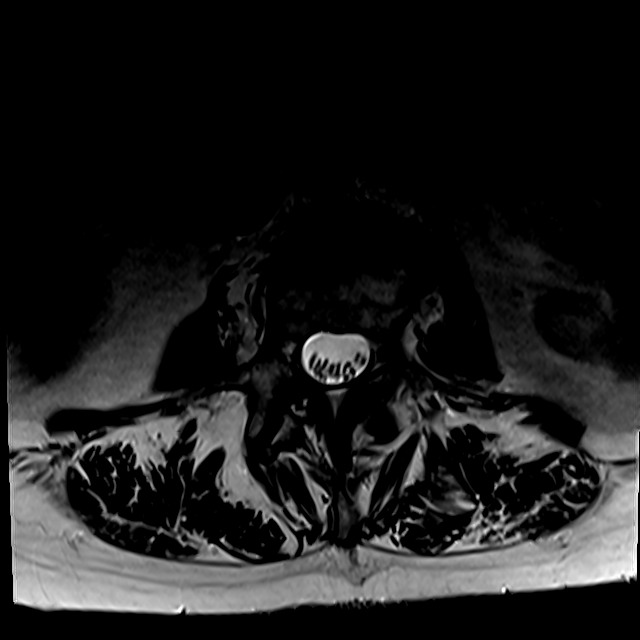
[im 22/43]
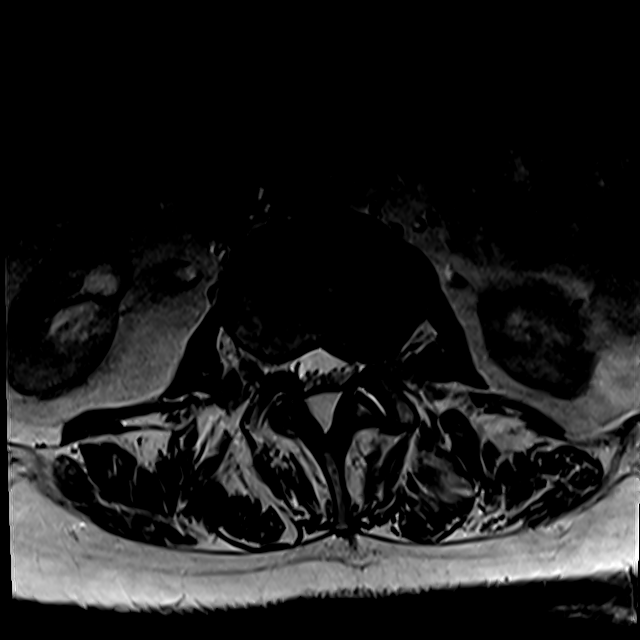
[im 37/43]
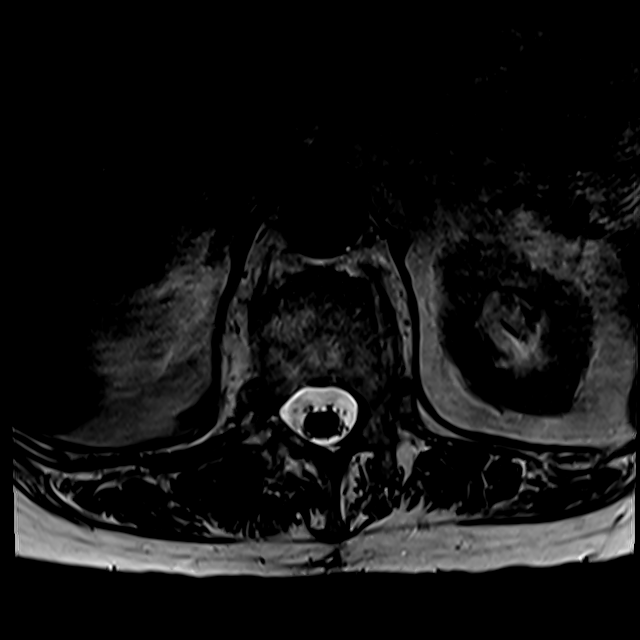

[Series 100: T1 · axial · 4.0mm · 0.28mm/px · z∈[+14,+198]mm · 3 of 43 slices shown (2 of 2)]
[im 7/43]
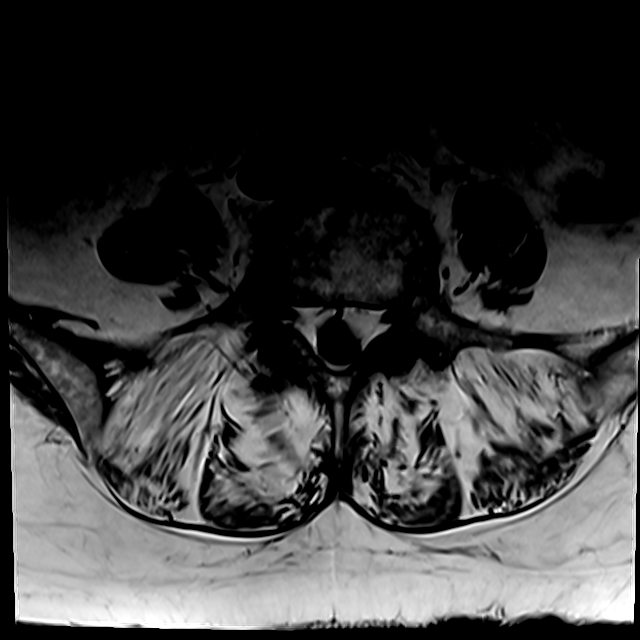
[im 22/43]
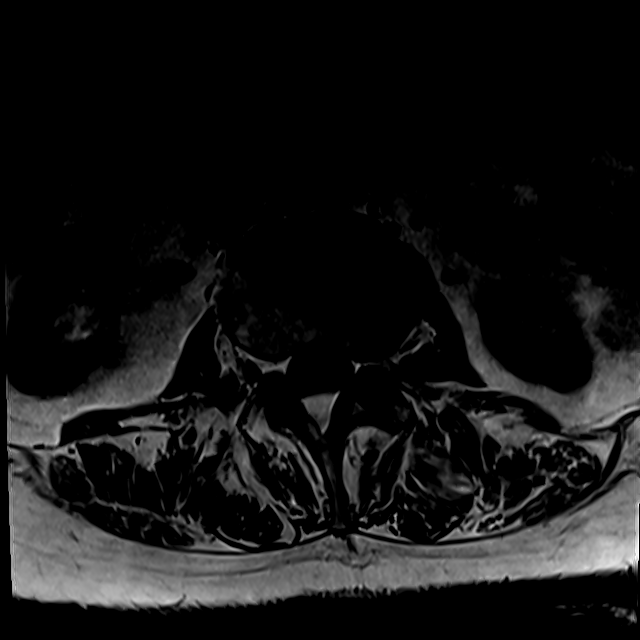
[im 37/43]
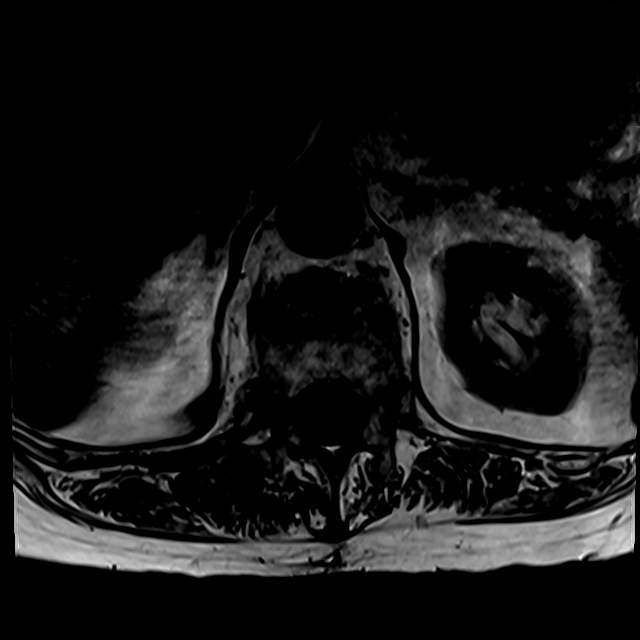

[18 of 48 positions shown; findings below may reference images not displayed]

FINDINGS: Segmentation:  5 lumbar type vertebral bodies.

Alignment:  Curvature convex to the right with the apex at L1-2.

Vertebrae: Discogenic marrow changes throughout the region.
Edematous component at the T12-L1 level more on the left, felt to be
discogenic edema rather than related to a compression fracture.
Edema throughout the sacrum consistent with sacral insufficiency
fracture.

Conus medullaris: Extends to the L1 level and appears normal.

Paraspinal and other soft tissues: Negative

Disc levels:

T12-L1: Mild bulging of the disc more towards the left. Discogenic
edematous change within the vertebral body marrow more on the left.
Findings could be associated with back pain. No neural compression
is seen.

L1-2: Chronic disc degeneration with endplate osteophytes and
bulging of the disc. Mild facet hypertrophy. Mild narrowing of the
left lateral recess and intervertebral foramen on the left but
without distinct neural compression.

L2-3: Mild bulging of the disc more towards the left. Mild narrowing
of left lateral recess and intervertebral foramen on the left but no
distinct neural compression.

L3-4: Moderate bulging of the disc. Slight indentation of the thecal
sac. No apparent neural compression.

L4-5: Mild bulging of the disc. Bilateral facet osteoarthritis. Mild
foraminal narrowing on the right without distinct neural
compression.

L5-S1: Endplate osteophytes and bulging of the disc. Ordinary facet
osteoarthritis. No stenosis or neural compression.

Sacral edema consistent with sacral insufficiency fracture.
IMPRESSION: Sacral insufficiency fracture presumed to be the cause of the
patient's symptoms.

Edema within the left side of the T12 and L1 vertebral bodies, felt
most likely to be degenerative discogenic edema rather than related
to a fracture.

Curvature the spine convex to the right with the apex at L1-2.
Left-sided predominant degenerative changes at T12-L1, L1-2 and
L2-3, but without definite neural compression.

Mild degenerative changes at L3-4, L4-5 and L5-S1 without clear
neural compression. These findings could certainly contribute to low
back pain.

## 2019-04-27 ENCOUNTER — Other Ambulatory Visit: Payer: Self-pay

## 2019-04-27 ENCOUNTER — Ambulatory Visit: Payer: Medicare Other | Admitting: Allergy & Immunology

## 2019-04-27 ENCOUNTER — Encounter: Payer: Self-pay | Admitting: Allergy & Immunology

## 2019-04-27 VITALS — BP 104/72 | HR 63 | Temp 98.2°F | Resp 18 | Ht 63.0 in | Wt 170.2 lb

## 2019-04-27 DIAGNOSIS — T7840XD Allergy, unspecified, subsequent encounter: Secondary | ICD-10-CM | POA: Diagnosis not present

## 2019-04-27 NOTE — Progress Notes (Signed)
NEW PATIENT  Date of Service/Encounter:  04/27/19  Referring provider: Binnie Rail, MD   Assessment:   Allergic reaction   Is unclear what led to [name]'s reaction.  We are going to do some screening labs to rule out serious causes of anaphylaxis.  Otherwise, our work-up is going to get very broad and would be like looking for a needle in a stack.  She is comfortable not having an EpiPen at this time, and I think that is reasonable.  Oftentimes these episodes are isolated and never recur.  We are going to look for red meat sensitivity with an alpha gal panel.  We will also look for mast cell disease with a serum tryptase.  We will rule out the most common foods, which rules out about 96 or 97% of all food allergies.  Plan/Recommendations:   1. Allergic reaction - I am unsure what caused your reaction.  - I was thinking that the delayed reaction was from alpha gal, which is a red meat sensitivity from lone star tick bites. - I am going to send a panel to check on that.   - I am going to get a panel to look for the most common foods. - I would like to get a serum tryptase to look for mast cell disorders.  - We are going to get a stinging insect panel as well. - I think we can hold off on an EpiPen at this time.   2. Return if symptoms worsen or fail to improve. This can be an in-person, a virtual Webex or a telephone follow up visit.   Subjective:   [name] [name] is a 78 y.o. female presenting today for evaluation of  Chief Complaint  Patient presents with   Allergic Reaction    Valyncia H Counts has a history of the following: Patient Active Problem List   Diagnosis Date Noted   SOB (shortness of breath) 01/22/2019   Chronic sinusitis 12/06/2018   Fatigue 12/06/2018   Hyperglycemia 01/03/2017   DDD (degenerative disc disease), lumbar 01/03/2017   Localized edema 04/13/2016   Osteoarthritis of right knee 09/21/2013   S/P total knee replacement using cement  09/18/2013   Anisocoria 07/26/2013   Varicose veins of lower extremities with other complications [id number]   Paroxysmal Atrial Fibrillation 06/29/2011   COUGH, CHRONIC 03/09/2010   HIP PAIN, BILATERAL 07/17/2009   ALLERGIC RHINITIS 04/23/2008   GERD 11/01/2007   Essential hypertension 09/20/2007   SKIN CANCER, LEG 07/03/2007   Hyperlipidemia 07/03/2007   Colitis, collagenous 07/03/2007   POLIOMYELITIS, HX OF 07/03/2007    History obtained from: chart review and patient.  Dashea H Gasser was referred by Binnie Rail, MD.     [name] [name] is a 78 y.o. female presenting for an evaluation of an allergic reaction.  Around 1 month ago, she awoke in the middle the night with facial swelling.  The evening prior to that, she had eaten a [employer] pizza as well as a salad.  The pizza was a 3 cheese pizza without pepperoni.  She looked to the ingredients after the event and she tolerates all of the foods listed on the ingredients without any problems.  She has eaten the salad ingredients since then without any problems.  She has never had this type of pizza before.  She has never had a reaction similar to this before.  She had no eye, tongue, or throat involvement.  She took Benadryl with minimal improvement.  She  did a video visit with her husband's doctor, who prescribed her prednisone.  He did feel like she needed to go to the emergency room, but she did not want to leave her house to do that.  She was not given an EpiPen because she was not interested in one.  Of note, she does have tick bites on a routine basis.  She denies any bites by the [facility] tick.  She tolerates all the major food allergens without adverse event.  She does not eat a lot of red meat.  She does have some food sensitivities including tomatoes, but she does not think this is related to her episode.  She has a history of cholangitis, which has caused her to have a sensitive stomach.  She does not have a history of asthma or  environmental allergies.  Occasionally she will have some postnasal drip.  This leads to a cough, but it is nothing that she takes any medications for.  Otherwise, there is no history of other atopic diseases, including asthma, drug allergies, stinging insect allergies, eczema, urticaria or contact dermatitis. There is no significant infectious history. Vaccinations are up to date.    Past Medical History: Patient Active Problem List   Diagnosis Date Noted   SOB (shortness of breath) 01/22/2019   Chronic sinusitis 12/06/2018   Fatigue 12/06/2018   Hyperglycemia 01/03/2017   DDD (degenerative disc disease), lumbar 01/03/2017   Localized edema 04/13/2016   Osteoarthritis of right knee 09/21/2013   S/P total knee replacement using cement 09/18/2013   Anisocoria 07/26/2013   Varicose veins of lower extremities with other complications [id number]   Paroxysmal Atrial Fibrillation 06/29/2011   COUGH, CHRONIC 03/09/2010   HIP PAIN, BILATERAL 07/17/2009   ALLERGIC RHINITIS 04/23/2008   GERD 11/01/2007   Essential hypertension 09/20/2007   SKIN CANCER, LEG 07/03/2007   Hyperlipidemia 07/03/2007   Colitis, collagenous 07/03/2007   POLIOMYELITIS, HX OF 07/03/2007    Medication List:  Allergies as of 04/27/2019      Reactions   Augmentin [amoxicillin-pot Clavulanate] Diarrhea   Levaquin [levofloxacin In D5w]    Severe join pain, muscle pain, and tendonitis   Polysporin [bacitracin-polymyxin B]    Blisters    Pravastatin Other (See Comments)   Muscle pain and weakness   Sulfonamide Derivatives Hives   Childhood reaction   Erythromycin Swelling, Rash      Medication List       Accurate as of April 27, 2019  3:56 PM. If you have any questions, ask your nurse or doctor.        bisoprolol 10 MG tablet Commonly known as: ZEBETA Take 1 tablet (10 mg total) by mouth daily.   diltiazem 360 MG 24 hr capsule Commonly known as: CARDIZEM CD TAKE (1) CAPSULE BY  MOUTH ONCE DAILY.   Eliquis 5 MG Tabs tablet Generic drug: apixaban TAKE 1 TABLET BY MOUTH TWICE DAILY   famotidine 20 MG tablet Commonly known as: PEPCID Take 20 mg by mouth daily.   furosemide 20 MG tablet Commonly known as: LASIX Take 1 tablet (20 mg total) by mouth daily. What changed: when to take this   spironolactone 25 MG tablet Commonly known as: ALDACTONE Take 1 tablet (25 mg total) by mouth daily.   UNISOM PO Take by mouth at bedtime as needed.       Birth History: non-contributory  Developmental History: non-contributory  Past Surgical History: Past Surgical History:  Procedure Laterality Date   ADENOIDECTOMY  APPENDECTOMY     at age 3   BREAST BIOPSY  1947 & 10/07   Benign   CARDIOVERSION N/A 11/05/2016   Procedure: CARDIOVERSION;  Surgeon: Sueanne Margarita, MD;  Location: [place];  Service: Cardiovascular;  Laterality: N/A;   cataracts removed     COLONOSCOPY     ENDOVENOUS ABLATION SAPHENOUS VEIN W/ LASER  03-03-2011 left greater saphenous vein    ENDOVENOUS ABLATION SAPHENOUS VEIN W/ LASER  01-06-2011  right greater saphenous vein   epidural infection     x 2    IR RADIOLOGIST EVAL & MGMT  03/07/2017   SKIN CANCER EXCISION     removal of skin cancer on left leg   TONSILLECTOMY     as a child   TONSILLECTOMY     TOTAL KNEE ARTHROPLASTY Right 09/18/2013   Procedure: TOTAL KNEE ARTHROPLASTY;  Surgeon: Garald Balding, MD;  Location: [place];  Service: Orthopedics;  Laterality: Right;     Family History: Family History  Problem Relation Age of Onset   Stomach cancer Maternal Grandfather    Heart disease Paternal Grandfather    Colon cancer Son    Rectal cancer Son 11   Colon cancer Paternal Grandmother    Alcohol abuse Father    Heart disease Father    Stroke Father    Hypertension Father    Hypertension Sister    Other Sister        varicose veins   Peripheral vascular disease Mother    Other Mother         varicose veins   Esophageal cancer Neg Hx    Pancreatic cancer Neg Hx      Social History: Aneyah lives at home with her husband, who has dementia and is on hospice for stage IV lung cancer.  They live in a house that was built in 1971.  There is laminate flooring throughout the home.  They have electric heating and central cooling.  There are no animals inside or outside of the home.  There are no dust mite covers on the bedding.  There is no tobacco exposure.  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  She did smoke from [place] through 1990, quitting for 6 years and that time when she was pregnant.   Review of Systems  Constitutional: Negative.  Negative for chills, fever, malaise/fatigue and weight loss.  HENT: Negative.  Negative for congestion, ear discharge, ear pain, sinus pain and sore throat.   Eyes: Negative for pain, discharge and redness.  Respiratory: Negative for cough, sputum production, shortness of breath and wheezing.   Cardiovascular: Negative.  Negative for chest pain and palpitations.  Gastrointestinal: Positive for abdominal pain, constipation and diarrhea. Negative for heartburn, nausea and vomiting.  Skin: Negative.  Negative for itching and rash.  Neurological: Negative for dizziness and headaches.  Endo/Heme/Allergies: Negative for environmental allergies. Does not bruise/bleed easily.       Objective:   Blood pressure 104/72, pulse 63, temperature 98.2 F (36.8 C), temperature source Temporal, resp. rate 18, height 5\' 3"  (1.6 m), weight 170 lb 3.2 oz (77.2 kg), SpO2 96 %. Body mass index is 30.15 kg/m.   Physical Exam:   Physical Exam  Constitutional: She appears well-developed.  Pleasant female.  HENT:  Head: Normocephalic and atraumatic.  Right Ear: Tympanic membrane, external ear and ear canal normal. No drainage, swelling or tenderness. Tympanic membrane is not injected, not scarred,  not erythematous, not  retracted and not bulging.  Left Ear: Tympanic membrane, external ear and ear canal normal. No drainage, swelling or tenderness. Tympanic membrane is not injected, not scarred, not erythematous, not retracted and not bulging.  Nose: No mucosal edema, rhinorrhea, nasal deformity or septal deviation. No epistaxis. Right sinus exhibits no maxillary sinus tenderness and no frontal sinus tenderness. Left sinus exhibits no maxillary sinus tenderness and no frontal sinus tenderness.  Mouth/Throat: Uvula is midline and oropharynx is clear and moist. Mucous membranes are not pale and not dry.  Eyes: Pupils are equal, round, and reactive to light. Conjunctivae and EOM are normal. Right eye exhibits no chemosis and no discharge. Left eye exhibits no chemosis and no discharge. Right conjunctiva is not injected. Left conjunctiva is not injected.  Cardiovascular: Normal rate, regular rhythm and normal heart sounds.  Respiratory: Effort normal and breath sounds normal. No accessory muscle usage. No tachypnea. No respiratory distress. She has no wheezes. She has no rhonchi. She has no rales. She exhibits no tenderness.  Moving air well in all lung fields.   GI: There is no abdominal tenderness. There is no rebound and no guarding.  Lymphadenopathy:       Head (right side): No submandibular, no tonsillar and no occipital adenopathy present.       Head (left side): No submandibular, no tonsillar and no occipital adenopathy present.    She has no cervical adenopathy.  Neurological: She is alert.  Skin: No abrasion, no petechiae and no rash noted. Rash is not papular, not vesicular and not urticarial. No erythema. No pallor.  No urticarial or eczematous lesions noted.   Psychiatric: She has a normal mood and affect.     Diagnostic studies: labs sent instead      Salvatore Marvel, MD [facility]

## 2019-04-27 NOTE — Patient Instructions (Addendum)
1. Allergic reaction - I am unsure what caused your reaction.  - I was thinking that the delayed reaction was from alpha gal, which is a red meat sensitivity from lone star tick bites. - I am going to send a panel to check on that.   - I am going to get a panel to look for the most common foods. - I would like to get a serum tryptase to look for mast cell disorders.  - We are going to get a stinging insect panel as well. - I think we can hold off on an EpiPen at this time.   2. Return if symptoms worsen or fail to improve. This can be an in-person, a virtual Webex or a telephone follow up visit.   Please inform us of any Emergency Department visits, hospitalizations, or changes in symptoms. Call us before going to the ED for breathing or allergy symptoms since we might be able to fit you in for a sick visit. Feel free to contact us anytime with any questions, problems, or concerns.  It was a pleasure to meet you today!  Websites that have reliable patient information: 1. American Academy of Asthma, Allergy, and Immunology: www.aaaai.org 2. Food Allergy Research and Education (FARE): foodallergy.org 3. Mothers of Asthmatics: http://www.asthmacommunitynetwork.org 4. American College of Allergy, Asthma, and Immunology: www.acaai.org  "Like" [place] on Facebook and Instagram for our latest updates!      Make sure you are registered to vote! If you have moved or changed any of your contact information, you will need to get this updated before voting!  In some cases, you MAY be able to register to vote online: [email or web address]    Voter ID laws are NOT going into effect for the General Election in November 2020! DO NOT let this stop you from exercising your right to vote!   Absentee voting is the SAFEST way to vote during the coronavirus pandemic!   Download and print an absentee ballot request form at [email or web address] or you can scan the QR code  below with your smart phone:      More information on absentee ballots can be found here: [email or web address]

## 2019-05-03 LAB — ALLERGEN PROFILE, BASIC FOOD
Allergen Corn, IgE: <0.1 kU/L
Beef IgE: 2.48 kU/L — AB
Chocolate/Cacao IgE: <0.1 kU/L
Egg, Whole IgE: <0.1 kU/L
Food Mix (Seafoods) IgE: NEGATIVE
Milk IgE: 0.45 kU/L — AB
Peanut IgE: <0.1 kU/L
Pork IgE: 1.72 kU/L — AB
Soybean IgE: <0.1 kU/L
Wheat IgE: <0.1 kU/L

## 2019-05-03 LAB — ALLERGEN STINGING INSECT PANEL
Honeybee IgE: <0.1 kU/L
Hornet, White Face, IgE: <0.1 kU/L
Hornet, Yellow, IgE: <0.1 kU/L
Paper Wasp IgE: <0.1 kU/L
Yellow Jacket, IgE: <0.1 kU/L

## 2019-05-03 LAB — ALPHA-GAL PANEL
Alpha Gal IgE*: 4.35 kU/L — ABNORMAL HIGH (ref <0.10)
Beef (Bos spp) IgE: 2.09 kU/L — ABNORMAL HIGH (ref <0.35)
Class Interpretation: 1
Class Interpretation: 2
Class Interpretation: 2
Lamb/Mutton (Ovis spp) IgE: 0.5 kU/L — ABNORMAL HIGH (ref <0.35)
Pork (Sus spp) IgE: 0.87 kU/L — ABNORMAL HIGH (ref <0.35)

## 2019-05-03 LAB — TRYPTASE: Tryptase: 5.4 ug/L (ref 2.2–13.2)

## 2019-05-07 ENCOUNTER — Telehealth: Payer: Self-pay

## 2019-05-07 NOTE — Telephone Encounter (Signed)
Informed patient Dr. Ernst Bowler will review her blood work tomorrow.

## 2019-05-07 NOTE — Telephone Encounter (Signed)
Patient is calling to follow up on her lab results.  Please Advise.

## 2019-05-28 ENCOUNTER — Telehealth: Payer: Self-pay | Admitting: Nurse Practitioner

## 2019-05-28 NOTE — Telephone Encounter (Signed)
pt states she had severe cramp and thought her colitits had come out of  remission.  She took some old budesonide and she has been for last 4 days 3 capsules 1 time daily.  and she is feel  better. should she continue taking?  and how long? and could she assume it was a  Colitis flare up? Please advise

## 2019-05-29 ENCOUNTER — Other Ambulatory Visit: Payer: Self-pay

## 2019-05-29 DIAGNOSIS — R197 Diarrhea, unspecified: Secondary | ICD-10-CM

## 2019-05-29 NOTE — Telephone Encounter (Signed)
Spoke with pt and she is aware. Order in epic for cdiff test. Pt is going to try to pick up the kit for cdiff test at [facility]. States she will call back to schedule OV.

## 2019-05-29 NOTE — Telephone Encounter (Signed)
Pt reports she had diarrhea for 3 weeks, she was waking up at 3am having to rush to the bathroom. She thought it may be her colitis acting up. States the symptoms were different from her usual colitis symptoms but she started budesonide 9mg  from 2017 on Friday and on Saturday the symptoms were almost completely gone. Pt wants to know if this sounds like her colitis and should she continue the budesonide and if so how long should she stay on the medication. Please advise.

## 2019-05-29 NOTE — Telephone Encounter (Signed)
It is possible that she has recurrent collagenous colitis, but will need to exclude infectious etiology.  Please check stool C. difficile. If C. difficile negative, will start budesonide 9 mg daily 30 days supply with 1 refill for total 60-day course.  Please schedule office follow-up visit next available with me or APP.  Thank you

## 2019-06-08 NOTE — Progress Notes (Signed)
HPI: FU AFib. Nuclear studyNov 2012 showed EF 69 and normal perfusion. Holter monitor December 2017 showed atrial fibrillation with rate mildly elevated.  Previously had cardioversion but did not hold sinus rhythm and now atrial fibrillation is managed with rate control and anticoagulation.  Echocardiogram June 2020 showed normal LV function, severe left atrial enlargement, moderate tricuspid regurgitation and mild aortic insufficiency.  Since I last saw her,there is no dyspnea, chest pain, palpitations or syncope.  No bleeding.  Current Outpatient Medications  Medication Sig Dispense Refill  . bisoprolol (ZEBETA) 10 MG tablet Take 1 tablet (10 mg total) by mouth daily. 90 tablet 1  . diltiazem (CARDIZEM CD) 360 MG 24 hr capsule TAKE (1) CAPSULE BY MOUTH ONCE DAILY. 90 capsule 1  . Doxylamine Succinate, Sleep, (UNISOM PO) Take by mouth at bedtime as needed.    Marland Kitchen ELIQUIS 5 MG TABS tablet TAKE 1 TABLET BY MOUTH TWICE DAILY 60 tablet 6  . famotidine (PEPCID) 20 MG tablet Take 20 mg by mouth daily.     . furosemide (LASIX) 20 MG tablet Take 1 tablet (20 mg total) by mouth daily. (Patient taking differently: Take 20 mg by mouth 2 (two) times daily. ) 90 tablet 1  . spironolactone (ALDACTONE) 25 MG tablet Take 1 tablet (25 mg total) by mouth daily. 90 tablet 1   No current facility-administered medications for this visit.      Past Medical History:  Diagnosis Date  . Allergic rhinitis    takes Benadryl at bedtime  . Allergy   . Anemia   . Angio-edema   . Arthritis    osteoarthritis  . Atrial fibrillation ([place]) 06-10-2011   takes Diltiazem and Pradaxa daily  . Cancer ([place])    skin  . Cataract    bilateral cataracts removed  . Clotting disorder ([place])    on PRADAXA - chronic a fib  . Collagenous colitis    recurrent, takes Budesonide daily as needed   . Colon polyps 01/14/2010   Hyperplastic  . Cough    states every day of her life and every chest xray is always clear  .  GERD (gastroesophageal reflux disease)    takes Omeprazole daily  . History of poliomyelitis    Polio  age 37- no significant neuromuscular deficit  . Hyperlipidemia    was on Pravastatin but joint pain;has been off for 2-41months ;takes CoQ10  . Hypertension    takes Bisoprolol and Chlorthalidone daily  . Potassium deficiency    takes KDUR daily    Past Surgical History:  Procedure Laterality Date  . ADENOIDECTOMY    . APPENDECTOMY     at age 75  . BREAST BIOPSY  1947 & 10/07   Benign  . CARDIOVERSION N/A 11/05/2016   Procedure: CARDIOVERSION;  Surgeon: Sueanne Margarita, MD;  Location: [facility];  Service: Cardiovascular;  Laterality: N/A;  . cataracts removed    . COLONOSCOPY    . ENDOVENOUS ABLATION SAPHENOUS VEIN W/ LASER  03-03-2011 left greater saphenous vein   . ENDOVENOUS ABLATION SAPHENOUS VEIN W/ LASER  01-06-2011  right greater saphenous vein  . epidural infection     x 2   . IR RADIOLOGIST EVAL & MGMT  03/07/2017  . SKIN CANCER EXCISION     removal of skin cancer on left leg  . TONSILLECTOMY     as a child  . TONSILLECTOMY    . TOTAL KNEE ARTHROPLASTY Right 09/18/2013   Procedure: TOTAL KNEE  ARTHROPLASTY;  Surgeon: Garald Balding, MD;  Location: [place];  Service: Orthopedics;  Laterality: Right;    Social History   Socioeconomic History  . Marital status: Married    Spouse name: Not on file  . Number of children: 2  . Years of education: Not on file  . Highest education level: Not on file  Occupational History  . Occupation: retired  Scientific laboratory technician  . Financial resource strain: Not hard at all  . Food insecurity    Worry: Never true    Inability: Never true  . Transportation needs    Medical: No    Non-medical: No  Tobacco Use  . Smoking status: Former Smoker    Packs/day: 1.00    Years: 18.00    Pack years: 18.00    Types: Cigarettes    Quit date: 08/17/1987    Years since quitting: 31.8  . Smokeless tobacco: Never Used  . Tobacco comment: quit  smoking 66yrs ago  Substance and Sexual Activity  . Alcohol use: Yes    Alcohol/week: 7.0 standard drinks    Types: 7 Glasses of wine per week    Comment: glass of wine daily  . Drug use: No  . Sexual activity: Not Currently    Partners: Male    Birth control/protection: Surgical  Lifestyle  . Physical activity    Days per week: 0 days    Minutes per session: 0 min  . Stress: To some extent  Relationships  . Social connections    Talks on phone: More than three times a week    Gets together: More than three times a week    Attends religious service: More than 4 times per year    Active member of club or organization: Yes    Attends meetings of clubs or organizations: More than 4 times per year    Relationship status: Married  . Intimate partner violence    Fear of current or ex partner: No    Emotionally abused: No    Physically abused: No    Forced sexual activity: No  Other Topics Concern  . 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 Stomach cancer Maternal Grandfather   . Heart disease Paternal Grandfather   . Colon cancer Son   . Rectal cancer Son 42  . Colon cancer Paternal Grandmother   . Alcohol abuse Father   . Heart disease Father   . Stroke Father   . Hypertension Father   . Hypertension Sister   . Other Sister        varicose veins  . Peripheral vascular disease Mother   . Other Mother        varicose veins  . Esophageal cancer Neg Hx   . Pancreatic cancer Neg Hx     ROS: no fevers or chills, productive cough, hemoptysis, dysphasia, odynophagia, melena, hematochezia, dysuria, hematuria, rash, seizure activity, orthopnea, PND, pedal edema, claudication. Remaining systems are negative.  Physical Exam:  Well-developed well-nourished in no acute distress.  Skin is warm and dry.  HEENT is normal.  Neck is supple.  Chest is clear to auscultation  with normal expansion.  Cardiovascular exam is irregular Abdominal exam nontender or distended. No masses palpated. Extremities show no edema. neuro grossly intact  ECG-atrial fibrillation at a rate of 78, no ST changes.  Personally reviewed  A/P  1 permanent atrial fibrillation-plan rate control and anticoagulation.  Continue present dose of Cardizem and bisoprolol.  Continue apixaban.  2 hypertension-blood pressure controlled.  Continue present medications and follow.  3 hyperlipidemia-patient is intolerant to statins.  Continue diet.  4 chronic pedal edema-reasonably well controlled.  Continue present dose of diuretic.  Check potassium and renal function.  LV function normal on most recent echo.  [name], MD

## 2019-06-12 ENCOUNTER — Ambulatory Visit: Payer: Medicare Other | Admitting: Cardiology

## 2019-06-12 ENCOUNTER — Other Ambulatory Visit: Payer: Self-pay

## 2019-06-12 ENCOUNTER — Encounter: Payer: Self-pay | Admitting: Cardiology

## 2019-06-12 VITALS — BP 118/76 | HR 78 | Temp 96.5°F | Ht 65.0 in | Wt 163.0 lb

## 2019-06-12 DIAGNOSIS — I10 Essential (primary) hypertension: Secondary | ICD-10-CM

## 2019-06-12 DIAGNOSIS — I4821 Permanent atrial fibrillation: Secondary | ICD-10-CM | POA: Diagnosis not present

## 2019-06-12 DIAGNOSIS — R609 Edema, unspecified: Secondary | ICD-10-CM | POA: Diagnosis not present

## 2019-06-12 LAB — BASIC METABOLIC PANEL
BUN/Creatinine Ratio: 30 — ABNORMAL HIGH (ref 12–28)
BUN: 31 mg/dL — ABNORMAL HIGH (ref 8–27)
CO2: 26 mmol/L (ref 20–29)
Calcium: 9.5 mg/dL (ref 8.7–10.3)
Chloride: 99 mmol/L (ref 96–106)
Creatinine, Ser: 1.02 mg/dL — ABNORMAL HIGH (ref 0.57–1.00)
GFR calc Af Amer: 61 mL/min/{1.73_m2} (ref >59)
GFR calc non Af Amer: 53 mL/min/{1.73_m2} — ABNORMAL LOW (ref >59)
Glucose: 95 mg/dL (ref 65–99)
Potassium: 4.6 mmol/L (ref 3.5–5.2)
Sodium: 138 mmol/L (ref 134–144)

## 2019-06-12 NOTE — Patient Instructions (Signed)
Medication Instructions:  NO CHANGE *If you need a refill on your cardiac medications before your next appointment, please call your pharmacy*  Lab Work: Your physician recommends that you HAVE LAB WORK TODAY If you have labs (blood work) drawn today and your tests are completely normal, you will receive your results only by: Marland Kitchen MyChart Message (if you have MyChart) OR . A paper copy in the mail If you have any lab test that is abnormal or we need to change your treatment, we will call you to review the results.  Follow-Up: At [facility], you and your health needs are our priority.  As part of our continuing mission to provide you with exceptional heart care, we have created designated Provider Care Teams.  These Care Teams include your primary Cardiologist (physician) and Advanced Practice Providers (APPs -  Physician Assistants and Nurse Practitioners) who all work together to provide you with the care you need, when you need it.  Your next appointment:   12 months  The format for your next appointment:   In Person  Provider:   [name], MD

## 2019-07-09 ENCOUNTER — Other Ambulatory Visit: Payer: Self-pay | Admitting: Cardiology

## 2019-08-07 ENCOUNTER — Other Ambulatory Visit: Payer: Self-pay | Admitting: Cardiology

## 2019-09-14 ENCOUNTER — Other Ambulatory Visit: Payer: Self-pay | Admitting: Cardiology

## 2019-09-21 DIAGNOSIS — N8110 Cystocele, unspecified: Secondary | ICD-10-CM | POA: Diagnosis not present

## 2019-09-21 DIAGNOSIS — Z6828 Body mass index (BMI) 28.0-28.9, adult: Secondary | ICD-10-CM | POA: Diagnosis not present

## 2019-09-21 DIAGNOSIS — Z01419 Encounter for gynecological examination (general) (routine) without abnormal findings: Secondary | ICD-10-CM | POA: Diagnosis not present

## 2019-09-21 DIAGNOSIS — Z4689 Encounter for fitting and adjustment of other specified devices: Secondary | ICD-10-CM | POA: Diagnosis not present

## 2019-10-12 ENCOUNTER — Other Ambulatory Visit: Payer: Self-pay | Admitting: Cardiology

## 2019-10-12 DIAGNOSIS — I480 Paroxysmal atrial fibrillation: Secondary | ICD-10-CM

## 2019-11-11 NOTE — Progress Notes (Signed)
Subjective:    Patient ID: [name] [name], female    DOB: 05-06-1941, 79 y.o.   MRN: [id number]  HPI She is here for a physical exam.   Her husband died last 2023/06/20 after battling lung cancer and dementia.  Overall, she is doing well and feels good.    Medications and allergies reviewed with patient and updated if appropriate.  Patient Active Problem List   Diagnosis Date Noted  . SOB (shortness of breath) 01/22/2019  . Chronic sinusitis 12/06/2018  . Hyperglycemia 01/03/2017  . DDD (degenerative disc disease), lumbar 01/03/2017  . Localized edema 04/13/2016  . Osteoarthritis of right knee 09/21/2013  . S/P total knee replacement using cement 09/18/2013  . Anisocoria 07/26/2013  . Varicose veins of lower extremities with other complications [id number]  . Paroxysmal Atrial Fibrillation 06/29/2011  . COUGH, CHRONIC 03/09/2010  . HIP PAIN, BILATERAL 07/17/2009  . ALLERGIC RHINITIS 04/23/2008  . GERD 11/01/2007  . Essential hypertension 09/20/2007  . SKIN CANCER, LEG 07/03/2007  . Hyperlipidemia 07/03/2007  . Colitis, collagenous 07/03/2007    Current Outpatient Medications on File Prior to Visit  Medication Sig Dispense Refill  . bisoprolol (ZEBETA) 10 MG tablet Take 1 tablet (10 mg total) by mouth daily. 90 tablet 1  . diltiazem (CARDIZEM CD) 360 MG 24 hr capsule TAKE (1) CAPSULE BY MOUTH ONCE DAILY. 90 capsule 1  . Doxylamine Succinate, Sleep, (UNISOM PO) Take by mouth at bedtime as needed.    Marland Kitchen ELIQUIS 5 MG TABS tablet TAKE 1 TABLET BY MOUTH TWICE DAILY 60 tablet 5  . famotidine (PEPCID) 20 MG tablet Take 20 mg by mouth daily.     Marland Kitchen spironolactone (ALDACTONE) 25 MG tablet Take 1 tablet (25 mg total) by mouth daily. 90 tablet 1   No current facility-administered medications on file prior to visit.    Past Medical History:  Diagnosis Date  . Allergic rhinitis    takes Benadryl at bedtime  . Allergy   . Anemia   . Angio-edema   . Arthritis    osteoarthritis  .  Atrial fibrillation ([place]) 06-10-2011   takes Diltiazem and Pradaxa daily  . Cancer ([place])    skin  . Cataract    bilateral cataracts removed  . Clotting disorder ([place])    on PRADAXA - chronic a fib  . Collagenous colitis    recurrent, takes Budesonide daily as needed   . Colon polyps 01/14/2010   Hyperplastic  . Cough    states every day of her life and every chest xray is always clear  . GERD (gastroesophageal reflux disease)    takes Omeprazole daily  . History of poliomyelitis    Polio  age 32- no significant neuromuscular deficit  . Hyperlipidemia    was on Pravastatin but joint pain;has been off for 2-36months ;takes CoQ10  . Hypertension    takes Bisoprolol and Chlorthalidone daily  . Potassium deficiency    takes KDUR daily    Past Surgical History:  Procedure Laterality Date  . ADENOIDECTOMY    . APPENDECTOMY     at age 81  . BREAST BIOPSY  1947 & 10/07   Benign  . CARDIOVERSION N/A 11/05/2016   Procedure: CARDIOVERSION;  Surgeon: Sueanne Margarita, MD;  Location: [facility];  Service: Cardiovascular;  Laterality: N/A;  . cataracts removed    . COLONOSCOPY    . ENDOVENOUS ABLATION SAPHENOUS VEIN W/ LASER  03-03-2011 left greater saphenous vein   . ENDOVENOUS ABLATION  SAPHENOUS VEIN W/ LASER  01-06-2011  right greater saphenous vein  . epidural infection     x 2   . IR RADIOLOGIST EVAL & MGMT  03/07/2017  . SKIN CANCER EXCISION     removal of skin cancer on left leg  . TONSILLECTOMY     as a child  . TONSILLECTOMY    . TOTAL KNEE ARTHROPLASTY Right 09/18/2013   Procedure: TOTAL KNEE ARTHROPLASTY;  Surgeon: Garald Balding, MD;  Location: [place];  Service: Orthopedics;  Laterality: Right;    Social History   Socioeconomic History  . Marital status: Married    Spouse name: Not on file  . Number of children: 2  . Years of education: Not on file  . Highest education level: Not on file  Occupational History  . Occupation: retired  Tobacco Use  . Smoking  status: Former Smoker    Packs/day: 1.00    Years: 18.00    Pack years: 18.00    Types: Cigarettes    Quit date: 08/17/1987    Years since quitting: 32.2  . Smokeless tobacco: Never Used  . Tobacco comment: quit smoking 16yrs ago  Substance and Sexual Activity  . Alcohol use: Yes    Alcohol/week: 7.0 standard drinks    Types: 7 Glasses of wine per week    Comment: glass of wine daily  . Drug use: No  . Sexual activity: Not Currently    Partners: Male    Birth control/protection: Surgical  Other Topics Concern  . 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 Difficulty of Paying Living Expenses:   Food Insecurity:   . Worried About Charity fundraiser in the Last Year:   . Arboriculturist in the Last Year:   Transportation Needs:   . Film/video editor (Medical):   Marland Kitchen Lack of Transportation (Non-Medical):   Physical Activity:   . Days of Exercise per Week:   . Minutes of Exercise per Session:   Stress: Stress Concern Present  . Feeling of Stress : To some extent  Social Connections: Unknown  . Frequency of Communication with Friends and Family: Not on file  . Frequency of Social Gatherings with Friends and Family: Not on file  . Attends Religious Services: Not on file  . Active Member of Clubs or Organizations: Yes  . Attends Archivist Meetings: Not on file  . Marital Status: Not on file    Family History  Problem Relation Age of Onset  . Stomach cancer Maternal Grandfather   . Heart disease Paternal Grandfather   . Colon cancer Son   . Rectal cancer Son 51  . Colon cancer Paternal Grandmother   . Alcohol abuse Father   . Heart disease Father   . Stroke Father   . Hypertension Father   .  Hypertension Sister   . Other Sister        varicose veins  . Peripheral vascular disease Mother   . Other Mother        varicose veins  . Esophageal cancer Neg Hx   . Pancreatic cancer  Neg Hx     Review of Systems  Constitutional: Negative for chills and fever.  HENT: Positive for postnasal drip.   Eyes: Negative for visual disturbance.  Respiratory: Positive for cough (chronic in morning only- from drainage). Negative for shortness of breath and wheezing.   Cardiovascular: Positive for leg swelling. Negative for chest pain and palpitations.  Gastrointestinal: Positive for diarrhea (occ). Negative for abdominal pain, blood in stool, constipation and nausea.       Gerd  Genitourinary: Negative for dysuria and hematuria.  Musculoskeletal: Positive for arthralgias.  Neurological: Negative for headaches.  Psychiatric/Behavioral: Negative for dysphoric mood. The patient is not nervous/anxious.        Objective:   Vitals:   11/12/19 1302  BP: 138/84  Pulse: 82  Resp: 16  Temp: 97.9 F (36.6 C)  SpO2: 98%   Filed Weights   11/12/19 1302  Weight: 163 lb (73.9 kg)   Body mass index is 27.12 kg/m.  BP Readings from Last 3 Encounters:  11/12/19 138/84  06/12/19 118/76  04/27/19 104/72    Wt Readings from Last 3 Encounters:  11/12/19 163 lb (73.9 kg)  06/12/19 163 lb (73.9 kg)  04/27/19 170 lb 3.2 oz (77.2 kg)     Physical Exam Constitutional: She appears well-developed and well-nourished. No distress.  HENT:  Head: Normocephalic and atraumatic.  Right Ear: External ear normal. Normal ear canal and TM Left Ear: External ear normal.  Normal ear canal and TM Mouth/Throat: Oropharynx is clear and moist.  Eyes: Conjunctivae and EOM are normal.  Neck: Neck supple. No tracheal deviation present. No thyromegaly present.  No carotid bruit  Cardiovascular: Normal rate, regular rhythm and normal heart sounds.   No murmur heard.  No edema. Pulmonary/Chest: Effort normal  and breath sounds normal. No respiratory distress. She has no wheezes. She has no rales.  Breast: deferred   Abdominal: Soft. She exhibits no distension. There is no tenderness.  Lymphadenopathy: She has no cervical adenopathy.  Skin: Skin is warm and dry. She is not diaphoretic.  Psychiatric: She has a normal mood and affect. Her behavior is normal.        Assessment & Plan:   Physical exam: Screening blood work    ordered Immunizations  Discussed tdap, shingrix,  had covid Colonoscopy   Up to date Mammogram  Up to date   Dexa   Due ? -  Done by gyn - due this year Eye exams  Due - will schedule Exercise  Active, trying to walk Weight  Good for age Substance abuse   none  See Problem List for Assessment and Plan of chronic medical problems.    This visit occurred during the SARS-CoV-2 public health emergency.  Safety protocols were in place, including screening questions prior to the visit, additional usage of staff PPE, and extensive cleaning of exam room while observing appropriate contact time as indicated for disinfecting solutions.

## 2019-11-11 NOTE — Patient Instructions (Addendum)
Blood work was ordered.    All other Health Maintenance issues reviewed.   All recommended immunizations and age-appropriate screenings are up-to-date or discussed.  No immunization administered today.   Medications reviewed and updated.  Changes include :   none  Your prescription(s) have been submitted to your pharmacy. Please take as directed and contact our office if you believe you are having problem(s) with the medication(s).    Please followup in 1 year    Health Maintenance, Female Adopting a healthy lifestyle and getting preventive care are important in promoting health and wellness. Ask your health care provider about:  The right schedule for you to have regular tests and exams.  Things you can do on your own to prevent diseases and keep yourself healthy. What should I know about diet, weight, and exercise? Eat a healthy diet   Eat a diet that includes plenty of vegetables, fruits, low-fat dairy products, and lean protein.  Do not eat a lot of foods that are high in solid fats, added sugars, or sodium. Maintain a healthy weight Body mass index (BMI) is used to identify weight problems. It estimates body fat based on height and weight. Your health care provider can help determine your BMI and help you achieve or maintain a healthy weight. Get regular exercise Get regular exercise. This is one of the most important things you can do for your health. Most adults should:  Exercise for at least 150 minutes each week. The exercise should increase your heart rate and make you sweat (moderate-intensity exercise).  Do strengthening exercises at least twice a week. This is in addition to the moderate-intensity exercise.  Spend less time sitting. Even light physical activity can be beneficial. Watch cholesterol and blood lipids Have your blood tested for lipids and cholesterol at 79 years of age, then have this test every 5 years. Have your cholesterol levels checked more  often if:  Your lipid or cholesterol levels are high.  You are older than 79 years of age.  You are at high risk for heart disease. What should I know about cancer screening? Depending on your health history and family history, you may need to have cancer screening at various ages. This may include screening for:  Breast cancer.  Cervical cancer.  Colorectal cancer.  Skin cancer.  Lung cancer. What should I know about heart disease, diabetes, and high blood pressure? Blood pressure and heart disease  High blood pressure causes heart disease and increases the risk of stroke. This is more likely to develop in people who have high blood pressure readings, are of African descent, or are overweight.  Have your blood pressure checked: ? Every 3-5 years if you are 18-39 years of age. ? Every year if you are 40 years old or older. Diabetes Have regular diabetes screenings. This checks your fasting blood sugar level. Have the screening done:  Once every three years after age 40 if you are at a normal weight and have a low risk for diabetes.  More often and at a younger age if you are overweight or have a high risk for diabetes. What should I know about preventing infection? Hepatitis B If you have a higher risk for hepatitis B, you should be screened for this virus. Talk with your health care provider to find out if you are at risk for hepatitis B infection. Hepatitis C Testing is recommended for:  Everyone born from 1945 through 1965.  Anyone with known risk factors for hepatitis   C. Sexually transmitted infections (STIs)  Get screened for STIs, including gonorrhea and chlamydia, if: ? You are sexually active and are younger than 79 years of age. ? You are older than 79 years of age and your health care provider tells you that you are at risk for this type of infection. ? Your sexual activity has changed since you were last screened, and you are at increased risk for chlamydia  or gonorrhea. Ask your health care provider if you are at risk.  Ask your health care provider about whether you are at high risk for HIV. Your health care provider may recommend a prescription medicine to help prevent HIV infection. If you choose to take medicine to prevent HIV, you should first get tested for HIV. You should then be tested every 3 months for as long as you are taking the medicine. Pregnancy  If you are about to stop having your period (premenopausal) and you may become pregnant, seek counseling before you get pregnant.  Take 400 to 800 micrograms (mcg) of folic acid every day if you become pregnant.  Ask for birth control (contraception) if you want to prevent pregnancy. Osteoporosis and menopause Osteoporosis is a disease in which the bones lose minerals and strength with aging. This can result in bone fractures. If you are 65 years old or older, or if you are at risk for osteoporosis and fractures, ask your health care provider if you should:  Be screened for bone loss.  Take a calcium or vitamin D supplement to lower your risk of fractures.  Be given hormone replacement therapy (HRT) to treat symptoms of menopause. Follow these instructions at home: Lifestyle  Do not use any products that contain nicotine or tobacco, such as cigarettes, e-cigarettes, and chewing tobacco. If you need help quitting, ask your health care provider.  Do not use street drugs.  Do not share needles.  Ask your health care provider for help if you need support or information about quitting drugs. Alcohol use  Do not drink alcohol if: ? Your health care provider tells you not to drink. ? You are pregnant, may be pregnant, or are planning to become pregnant.  If you drink alcohol: ? Limit how much you use to 0-1 drink a day. ? Limit intake if you are breastfeeding.  Be aware of how much alcohol is in your drink. In the [place] one drink equals one 12 oz bottle of beer (355 mL), one 5 oz  glass of wine (148 mL), or one 1 oz glass of hard liquor (44 mL). General instructions  Schedule regular health, dental, and eye exams.  Stay current with your vaccines.  Tell your health care provider if: ? You often feel depressed. ? You have ever been abused or do not feel safe at home. Summary  Adopting a healthy lifestyle and getting preventive care are important in promoting health and wellness.  Follow your health care provider's instructions about healthy diet, exercising, and getting tested or screened for diseases.  Follow your health care provider's instructions on monitoring your cholesterol and blood pressure. This information is not intended to replace advice given to you by your health care provider. Make sure you discuss any questions you have with your health care provider. Document Revised: 07/26/2018 Document Reviewed: 07/26/2018 Elsevier Patient Education  2020 Elsevier Inc.  

## 2019-11-12 ENCOUNTER — Other Ambulatory Visit: Payer: Self-pay

## 2019-11-12 ENCOUNTER — Ambulatory Visit (INDEPENDENT_AMBULATORY_CARE_PROVIDER_SITE_OTHER): Payer: Medicare PPO | Admitting: Internal Medicine

## 2019-11-12 ENCOUNTER — Encounter: Payer: Self-pay | Admitting: Internal Medicine

## 2019-11-12 VITALS — BP 138/84 | HR 82 | Temp 97.9°F | Resp 16 | Ht 65.0 in | Wt 163.0 lb

## 2019-11-12 DIAGNOSIS — I10 Essential (primary) hypertension: Secondary | ICD-10-CM | POA: Diagnosis not present

## 2019-11-12 DIAGNOSIS — R600 Localized edema: Secondary | ICD-10-CM

## 2019-11-12 DIAGNOSIS — Z0000 Encounter for general adult medical examination without abnormal findings: Secondary | ICD-10-CM | POA: Diagnosis not present

## 2019-11-12 DIAGNOSIS — I4821 Permanent atrial fibrillation: Secondary | ICD-10-CM | POA: Diagnosis not present

## 2019-11-12 DIAGNOSIS — E782 Mixed hyperlipidemia: Secondary | ICD-10-CM | POA: Diagnosis not present

## 2019-11-12 DIAGNOSIS — R739 Hyperglycemia, unspecified: Secondary | ICD-10-CM

## 2019-11-12 DIAGNOSIS — N8110 Cystocele, unspecified: Secondary | ICD-10-CM | POA: Insufficient documentation

## 2019-11-12 LAB — COMPREHENSIVE METABOLIC PANEL
ALT: 16 U/L (ref 0–35)
AST: 19 U/L (ref 0–37)
Albumin: 4.3 g/dL (ref 3.5–5.2)
Alkaline Phosphatase: 64 U/L (ref 39–117)
BUN: 27 mg/dL — ABNORMAL HIGH (ref 6–23)
CO2: 26 mEq/L (ref 19–32)
Calcium: 9.5 mg/dL (ref 8.4–10.5)
Chloride: 102 mEq/L (ref 96–112)
Creatinine, Ser: 0.92 mg/dL (ref 0.40–1.20)
GFR: 58.88 mL/min — ABNORMAL LOW (ref >60.00)
Glucose, Bld: 91 mg/dL (ref 70–99)
Potassium: 4.3 mEq/L (ref 3.5–5.1)
Sodium: 135 mEq/L (ref 135–145)
Total Bilirubin: 0.5 mg/dL (ref 0.2–1.2)
Total Protein: 7.1 g/dL (ref 6.0–8.3)

## 2019-11-12 LAB — LIPID PANEL
Cholesterol: 224 mg/dL — ABNORMAL HIGH (ref 0–200)
HDL: 64.6 mg/dL (ref >39.00)
LDL Cholesterol: 141 mg/dL — ABNORMAL HIGH (ref 0–99)
NonHDL: 159.4
Total CHOL/HDL Ratio: 3
Triglycerides: 93 mg/dL (ref 0.0–149.0)
VLDL: 18.6 mg/dL (ref 0.0–40.0)

## 2019-11-12 LAB — CBC WITH DIFFERENTIAL/PLATELET
Basophils Absolute: 0.1 10*3/uL (ref 0.0–0.1)
Basophils Relative: 0.9 % (ref 0.0–3.0)
Eosinophils Absolute: 0.1 10*3/uL (ref 0.0–0.7)
Eosinophils Relative: 1.4 % (ref 0.0–5.0)
HCT: 40.5 % (ref 36.0–46.0)
Hemoglobin: 13.7 g/dL (ref 12.0–15.0)
Lymphocytes Relative: 22.7 % (ref 12.0–46.0)
Lymphs Abs: 1.6 10*3/uL (ref 0.7–4.0)
MCHC: 33.9 g/dL (ref 30.0–36.0)
MCV: 102 fl — ABNORMAL HIGH (ref 78.0–100.0)
Monocytes Absolute: 1 10*3/uL (ref 0.1–1.0)
Monocytes Relative: 13.5 % — ABNORMAL HIGH (ref 3.0–12.0)
Neutro Abs: 4.4 10*3/uL (ref 1.4–7.7)
Neutrophils Relative %: 61.5 % (ref 43.0–77.0)
Platelets: 242 10*3/uL (ref 150.0–400.0)
RBC: 3.97 Mil/uL (ref 3.87–5.11)
RDW: 13.4 % (ref 11.5–15.5)
WBC: 7.2 10*3/uL (ref 4.0–10.5)

## 2019-11-12 LAB — TSH: TSH: 1.5 u[IU]/mL (ref 0.35–4.50)

## 2019-11-12 LAB — HEMOGLOBIN A1C: Hgb A1c MFr Bld: 5.4 % (ref 4.6–6.5)

## 2019-11-12 MED ORDER — FUROSEMIDE 20 MG PO TABS: 20.0000 mg | ORAL_TABLET | Freq: Every day | ORAL | 2 refills | Status: DC

## 2019-11-12 NOTE — Assessment & Plan Note (Signed)
Chronic Statin intolerant Check lipid panel  Lifestyle controlled Regular exercise and healthy diet encouraged

## 2019-11-12 NOTE — Assessment & Plan Note (Signed)
Chronic BP well controlled Current regimen effective and well tolerated Continue current medications at current doses cmp  

## 2019-11-12 NOTE — Assessment & Plan Note (Signed)
Chronic followig with cardiology On eliquis, cardizem Cmp, cbc, tsh

## 2019-11-12 NOTE — Assessment & Plan Note (Signed)
Chronic She takes the lasix as needed - every other day That did help and would like to continue Wears compression socks daily

## 2019-11-12 NOTE — Assessment & Plan Note (Signed)
Chronic Check a1c Low sugar / carb diet Stressed regular exercise  

## 2019-11-14 ENCOUNTER — Encounter: Payer: Self-pay | Admitting: Internal Medicine

## 2019-11-20 ENCOUNTER — Other Ambulatory Visit: Payer: Self-pay | Admitting: Cardiology

## 2019-11-21 DIAGNOSIS — D485 Neoplasm of uncertain behavior of skin: Secondary | ICD-10-CM | POA: Diagnosis not present

## 2019-11-21 DIAGNOSIS — D225 Melanocytic nevi of trunk: Secondary | ICD-10-CM | POA: Diagnosis not present

## 2019-11-21 DIAGNOSIS — Z4689 Encounter for fitting and adjustment of other specified devices: Secondary | ICD-10-CM | POA: Diagnosis not present

## 2019-11-21 DIAGNOSIS — B351 Tinea unguium: Secondary | ICD-10-CM | POA: Diagnosis not present

## 2019-11-21 DIAGNOSIS — L821 Other seborrheic keratosis: Secondary | ICD-10-CM | POA: Diagnosis not present

## 2019-11-21 DIAGNOSIS — C44319 Basal cell carcinoma of skin of other parts of face: Secondary | ICD-10-CM | POA: Diagnosis not present

## 2019-11-21 DIAGNOSIS — N8182 Incompetence or weakening of pubocervical tissue: Secondary | ICD-10-CM | POA: Diagnosis not present

## 2019-11-21 DIAGNOSIS — D1801 Hemangioma of skin and subcutaneous tissue: Secondary | ICD-10-CM | POA: Diagnosis not present

## 2019-11-21 DIAGNOSIS — L438 Other lichen planus: Secondary | ICD-10-CM | POA: Diagnosis not present

## 2019-11-21 DIAGNOSIS — L814 Other melanin hyperpigmentation: Secondary | ICD-10-CM | POA: Diagnosis not present

## 2019-12-09 DIAGNOSIS — I739 Peripheral vascular disease, unspecified: Secondary | ICD-10-CM | POA: Diagnosis not present

## 2019-12-09 DIAGNOSIS — M2550 Pain in unspecified joint: Secondary | ICD-10-CM | POA: Diagnosis not present

## 2019-12-09 DIAGNOSIS — D6869 Other thrombophilia: Secondary | ICD-10-CM | POA: Diagnosis not present

## 2019-12-09 DIAGNOSIS — Z882 Allergy status to sulfonamides status: Secondary | ICD-10-CM | POA: Diagnosis not present

## 2019-12-09 DIAGNOSIS — Z85828 Personal history of other malignant neoplasm of skin: Secondary | ICD-10-CM | POA: Diagnosis not present

## 2019-12-09 DIAGNOSIS — Z8249 Family history of ischemic heart disease and other diseases of the circulatory system: Secondary | ICD-10-CM | POA: Diagnosis not present

## 2019-12-09 DIAGNOSIS — I4891 Unspecified atrial fibrillation: Secondary | ICD-10-CM | POA: Diagnosis not present

## 2019-12-09 DIAGNOSIS — Z87891 Personal history of nicotine dependence: Secondary | ICD-10-CM | POA: Diagnosis not present

## 2019-12-09 DIAGNOSIS — R32 Unspecified urinary incontinence: Secondary | ICD-10-CM | POA: Diagnosis not present

## 2019-12-09 DIAGNOSIS — G8929 Other chronic pain: Secondary | ICD-10-CM | POA: Diagnosis not present

## 2019-12-09 DIAGNOSIS — R197 Diarrhea, unspecified: Secondary | ICD-10-CM | POA: Diagnosis not present

## 2019-12-09 DIAGNOSIS — Z881 Allergy status to other antibiotic agents status: Secondary | ICD-10-CM | POA: Diagnosis not present

## 2019-12-09 DIAGNOSIS — I10 Essential (primary) hypertension: Secondary | ICD-10-CM | POA: Diagnosis not present

## 2019-12-09 DIAGNOSIS — Z823 Family history of stroke: Secondary | ICD-10-CM | POA: Diagnosis not present

## 2019-12-09 DIAGNOSIS — Z880 Allergy status to penicillin: Secondary | ICD-10-CM | POA: Diagnosis not present

## 2019-12-09 DIAGNOSIS — K219 Gastro-esophageal reflux disease without esophagitis: Secondary | ICD-10-CM | POA: Diagnosis not present

## 2019-12-12 DIAGNOSIS — C44319 Basal cell carcinoma of skin of other parts of face: Secondary | ICD-10-CM | POA: Diagnosis not present

## 2019-12-12 DIAGNOSIS — Z85828 Personal history of other malignant neoplasm of skin: Secondary | ICD-10-CM | POA: Diagnosis not present

## 2020-01-15 DIAGNOSIS — N8182 Incompetence or weakening of pubocervical tissue: Secondary | ICD-10-CM | POA: Diagnosis not present

## 2020-01-15 DIAGNOSIS — Z4689 Encounter for fitting and adjustment of other specified devices: Secondary | ICD-10-CM | POA: Diagnosis not present

## 2020-01-18 DIAGNOSIS — R040 Epistaxis: Secondary | ICD-10-CM | POA: Diagnosis not present

## 2020-02-11 DIAGNOSIS — N760 Acute vaginitis: Secondary | ICD-10-CM | POA: Diagnosis not present

## 2020-02-11 DIAGNOSIS — Z4689 Encounter for fitting and adjustment of other specified devices: Secondary | ICD-10-CM | POA: Diagnosis not present

## 2020-02-19 ENCOUNTER — Other Ambulatory Visit: Payer: Self-pay | Admitting: Cardiology

## 2020-03-03 DIAGNOSIS — N8110 Cystocele, unspecified: Secondary | ICD-10-CM | POA: Diagnosis not present

## 2020-03-03 DIAGNOSIS — N760 Acute vaginitis: Secondary | ICD-10-CM | POA: Diagnosis not present

## 2020-03-03 DIAGNOSIS — Z1231 Encounter for screening mammogram for malignant neoplasm of breast: Secondary | ICD-10-CM | POA: Diagnosis not present

## 2020-03-19 DIAGNOSIS — N8110 Cystocele, unspecified: Secondary | ICD-10-CM | POA: Diagnosis not present

## 2020-03-20 ENCOUNTER — Other Ambulatory Visit: Payer: Self-pay | Admitting: Cardiology

## 2020-05-04 ENCOUNTER — Other Ambulatory Visit: Payer: Self-pay | Admitting: Cardiology

## 2020-05-04 DIAGNOSIS — I480 Paroxysmal atrial fibrillation: Secondary | ICD-10-CM

## 2020-05-07 DIAGNOSIS — M8588 Other specified disorders of bone density and structure, other site: Secondary | ICD-10-CM | POA: Diagnosis not present

## 2020-05-07 DIAGNOSIS — N958 Other specified menopausal and perimenopausal disorders: Secondary | ICD-10-CM | POA: Diagnosis not present

## 2020-05-09 ENCOUNTER — Encounter: Payer: Self-pay | Admitting: Internal Medicine

## 2020-05-26 ENCOUNTER — Other Ambulatory Visit: Payer: Self-pay | Admitting: Cardiology

## 2020-06-05 NOTE — Progress Notes (Signed)
HPI: FU AFib. Nuclear studyNov 2012 showed EF 69 and normal perfusion. Holter monitor December 2017 showed atrial fibrillation with rate mildly elevated.  Previously had cardioversion but did not hold sinus rhythm and now atrial fibrillation is managed with rate control and anticoagulation.  Echocardiogram June 2020 showed normal LV function, severe left atrial enlargement, moderate tricuspid regurgitation and mild aortic insufficiency.  Since I last saw her, she has dyspnea with more lengthy walks but not routine activities.  No orthopnea or PND.  Chronic pedal edema.  She denies exertional chest pain or syncope.  Current Outpatient Medications  Medication Sig Dispense Refill   bisoprolol (ZEBETA) 10 MG tablet TAKE 1 TABLET BY MOUTH ONCE DAILY. 90 tablet 0   diltiazem (CARDIZEM CD) 360 MG 24 hr capsule TAKE (1) CAPSULE BY MOUTH ONCE DAILY. 90 capsule 1   Doxylamine Succinate, Sleep, (UNISOM PO) Take by mouth at bedtime as needed.     ELIQUIS 5 MG TABS tablet TAKE 1 TABLET BY MOUTH TWICE DAILY 180 tablet 1   famotidine (PEPCID) 20 MG tablet Take 20 mg by mouth daily.      furosemide (LASIX) 20 MG tablet Take 1 tablet (20 mg total) by mouth daily. (Patient taking differently: Take 20 mg by mouth daily. Patient only takes this about twice a week) 90 tablet 2   spironolactone (ALDACTONE) 25 MG tablet TAKE 1 TABLET BY MOUTH ONCE DAILY. 90 tablet 2   No current facility-administered medications for this visit.     Past Medical History:  Diagnosis Date   Allergic rhinitis    takes Benadryl at bedtime   Allergy    Anemia    Angio-edema    Arthritis    osteoarthritis   Atrial fibrillation ([place]) 06-10-2011   takes Diltiazem and Pradaxa daily   Cancer [facility])    skin   Cataract    bilateral cataracts removed   Clotting disorder ([facility])    on PRADAXA - chronic a fib   Collagenous colitis    recurrent, takes Budesonide daily as needed    Colon polyps 01/14/2010    Hyperplastic   Cough    states every day of her life and every chest xray is always clear   GERD (gastroesophageal reflux disease)    takes Omeprazole daily   History of poliomyelitis    Polio  age 51- no significant neuromuscular deficit   Hyperlipidemia    was on Pravastatin but joint pain;has been off for 2-52months ;takes CoQ10   Hypertension    takes Bisoprolol and Chlorthalidone daily   Potassium deficiency    takes KDUR daily    Past Surgical History:  Procedure Laterality Date   ADENOIDECTOMY     APPENDECTOMY     at age 52   BREAST BIOPSY  1947 & 10/07   Benign   CARDIOVERSION N/A 11/05/2016   Procedure: CARDIOVERSION;  Surgeon: Sueanne Margarita, MD;  Location: [place];  Service: Cardiovascular;  Laterality: N/A;   cataracts removed     COLONOSCOPY     ENDOVENOUS ABLATION SAPHENOUS VEIN W/ LASER  03-03-2011 left greater saphenous vein    ENDOVENOUS ABLATION SAPHENOUS VEIN W/ LASER  01-06-2011  right greater saphenous vein   epidural infection     x 2    IR RADIOLOGIST EVAL & MGMT  03/07/2017   SKIN CANCER EXCISION     removal of skin cancer on left leg   TONSILLECTOMY     as a child  TONSILLECTOMY     TOTAL KNEE ARTHROPLASTY Right 09/18/2013   Procedure: TOTAL KNEE ARTHROPLASTY;  Surgeon: Garald Balding, MD;  Location: [place];  Service: Orthopedics;  Laterality: Right;    Social History   Socioeconomic History   Marital status: Married    Spouse name: Not on file   Number of children: 2   Years of education: Not on file   Highest education level: Not on file  Occupational History   Occupation: retired  Tobacco Use   Smoking status: Former Smoker    Packs/day: 1.00    Years: 18.00    Pack years: 18.00    Types: Cigarettes    Quit date: 08/17/1987    Years since quitting: 32.8   Smokeless tobacco: Never Used   Tobacco comment: quit smoking 52yrs ago  Vaping Use   Vaping Use: Never used  Substance and Sexual Activity    Alcohol use: Yes    Alcohol/week: 7.0 standard drinks    Types: 7 Glasses of wine per week    Comment: glass of wine daily   Drug use: No   Sexual activity: Not Currently    Partners: Male    Birth control/protection: Surgical  Other Topics Concern   Not on file  Social History Narrative   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   Married     Former Smoker quit 1989    Alcohol use-yes -  glasses of wine/day      son works for [employer] - she flies to [place] to visit her grandchildren   Social Determinants of Radio broadcast assistant Strain:    Difficulty of Paying Living Expenses: Not on [employer] Insecurity:    Worried About Charity fundraiser in the Last Year: Not on file   [employer] of Food in the Last Year: Not on file  Transportation Needs:    Film/video editor (Medical): Not on file   Lack of Transportation (Non-Medical): Not on file  Physical Activity:    Days of Exercise per Week: Not on file   Minutes of Exercise per Session: Not on file  Stress:    Feeling of Stress : Not on file  Social Connections:    Frequency of Communication with Friends and Family: Not on file   Frequency of Social Gatherings with Friends and Family: Not on file   Attends Religious Services: Not on file   Active Member of Clubs or Organizations: Not on file   Attends Archivist Meetings: Not on file   Marital Status: Not on file  Intimate Partner Violence:    Fear of Current or Ex-Partner: Not on file   Emotionally Abused: Not on file   Physically Abused: Not on file   Sexually Abused: Not on file    Family History  Problem Relation Age of Onset   Stomach cancer Maternal Grandfather    Heart disease Paternal Grandfather    Colon cancer Son    Rectal cancer Son 52   Colon cancer Paternal Grandmother    Alcohol abuse Father    Heart disease Father    Stroke Father    Hypertension Father    Hypertension Sister    Other Sister         varicose veins   Peripheral vascular disease Mother    Other Mother        varicose veins   Esophageal cancer Neg Hx    Pancreatic cancer  Neg Hx     ROS: no fevers or chills, productive cough, hemoptysis, dysphasia, odynophagia, melena, hematochezia, dysuria, hematuria, rash, seizure activity, orthopnea, PND, pedal edema, claudication. Remaining systems are negative.  Physical Exam: Well-developed well-nourished in no acute distress.  Skin is warm and dry.  HEENT is normal.  Neck is supple.  Chest is clear to auscultation with normal expansion.  Cardiovascular exam is irregular Abdominal exam nontender or distended. No masses palpated. Extremities show no edema. neuro grossly intact  ECG-atrial fibrillation at a rate of 66, no ST changes.  Personally reviewed  A/P  1 permanent atrial fibrillation-continue Cardizem and bisoprolol for rate control.  Continue apixaban.  Check hemoglobin and renal function.  2 hypertension-patient's blood pressure is controlled today.  Continue present medications.  3 hyperlipidemia-intolerant to statins.  Continue diet.  4 chronic pedal edema-continue diuretic at present dose.  Check potassium and renal function.  [name], MD

## 2020-06-10 ENCOUNTER — Telehealth: Payer: Self-pay | Admitting: Gastroenterology

## 2020-06-10 NOTE — Telephone Encounter (Signed)
Pt is requesting an order to be placed for her to bring in a specimen to check for C-Dif

## 2020-06-10 NOTE — Telephone Encounter (Signed)
Patient continues to have spells of diarrhea. It will wake her up sometimes and she has to hurry to the bathroom or risk incontinence. She self medicates with her "hoarded" budesonide. She is aware that she could have infection but feels strongly that she is flaring with colitis. We have scheduled her for follow up on 06/30/20.  Would it be helpful to have a C diff test before then? She is coming to [facility] tomorrow and could come to the lab. Please advise.

## 2020-06-11 ENCOUNTER — Ambulatory Visit: Payer: Medicare PPO | Admitting: Cardiology

## 2020-06-11 ENCOUNTER — Other Ambulatory Visit: Payer: Self-pay

## 2020-06-11 ENCOUNTER — Encounter: Payer: Self-pay | Admitting: Cardiology

## 2020-06-11 VITALS — BP 126/76 | HR 66 | Ht 65.5 in | Wt 165.0 lb

## 2020-06-11 DIAGNOSIS — R609 Edema, unspecified: Secondary | ICD-10-CM | POA: Diagnosis not present

## 2020-06-11 DIAGNOSIS — I10 Essential (primary) hypertension: Secondary | ICD-10-CM | POA: Diagnosis not present

## 2020-06-11 DIAGNOSIS — I4821 Permanent atrial fibrillation: Secondary | ICD-10-CM | POA: Diagnosis not present

## 2020-06-11 NOTE — Patient Instructions (Signed)
°  Lab Work:  Your physician recommends that you HAVE LAB WORK TODAY  If you have labs (blood work) drawn today and your tests are completely normal, you will receive your results only by: MyChart Message (if you have MyChart) OR A paper copy in the mail If you have any lab test that is abnormal or we need to change your treatment, we will call you to review the results.   Follow-Up: At [facility], you and your health needs are our priority.  As part of our continuing mission to provide you with exceptional heart care, we have created designated Provider Care Teams.  These Care Teams include your primary Cardiologist (physician) and Advanced Practice Providers (APPs -  Physician Assistants and Nurse Practitioners) who all work together to provide you with the care you need, when you need it.  We recommend signing up for the patient portal called "MyChart".  Sign up information is provided on this After Visit Summary.  MyChart is used to connect with patients for Virtual Visits (Telemedicine).  Patients are able to view lab/test results, encounter notes, upcoming appointments, etc.  Non-urgent messages can be sent to your provider as well.   To learn more about what you can do with MyChart, go to [email or web address].    Your next appointment:   12 month(s)  The format for your next appointment:   In Person  Provider:   Brian Crenshaw, MD   

## 2020-06-12 ENCOUNTER — Other Ambulatory Visit: Payer: Self-pay

## 2020-06-12 DIAGNOSIS — R197 Diarrhea, unspecified: Secondary | ICD-10-CM

## 2020-06-12 LAB — CBC
Hematocrit: 38.9 % (ref 34.0–46.6)
Hemoglobin: 12.8 g/dL (ref 11.1–15.9)
MCH: 34 pg — ABNORMAL HIGH (ref 26.6–33.0)
MCHC: 32.9 g/dL (ref 31.5–35.7)
MCV: 103 fL — ABNORMAL HIGH (ref 79–97)
Platelets: 212 10*3/uL (ref 150–450)
RBC: 3.77 x10E6/uL (ref 3.77–5.28)
RDW: 12.6 % (ref 11.7–15.4)
WBC: 7.5 10*3/uL (ref 3.4–10.8)

## 2020-06-12 LAB — BASIC METABOLIC PANEL
BUN/Creatinine Ratio: 23 (ref 12–28)
BUN: 26 mg/dL (ref 8–27)
CO2: 25 mmol/L (ref 20–29)
Calcium: 9.7 mg/dL (ref 8.7–10.3)
Chloride: 100 mmol/L (ref 96–106)
Creatinine, Ser: 1.13 mg/dL — ABNORMAL HIGH (ref 0.57–1.00)
GFR calc Af Amer: 53 mL/min/{1.73_m2} — ABNORMAL LOW (ref >59)
GFR calc non Af Amer: 46 mL/min/{1.73_m2} — ABNORMAL LOW (ref >59)
Glucose: 77 mg/dL (ref 65–99)
Potassium: 5.3 mmol/L — ABNORMAL HIGH (ref 3.5–5.2)
Sodium: 141 mmol/L (ref 134–144)

## 2020-06-12 NOTE — Telephone Encounter (Signed)
Spoke with the patient. She will be back to [facility] next week and will pick up the collection supplies at that time. She states she has stated eating live culture yogurt for the past 4 days. Today she can states that she does see improvement in her diarrhea. Remains afebrile. No abdominal pain.

## 2020-06-12 NOTE — Telephone Encounter (Signed)
Agree, will need to exclude acute infection, please ask her to submit stool specimen for GI pathogen panel. Follow up in office visit as scheduled. If GI path panel is negative, will get her started on course of Budesonide and discuss further management. Thanks

## 2020-06-16 ENCOUNTER — Encounter: Payer: Self-pay | Admitting: *Deleted

## 2020-06-24 ENCOUNTER — Other Ambulatory Visit: Payer: Medicare PPO

## 2020-06-24 DIAGNOSIS — R197 Diarrhea, unspecified: Secondary | ICD-10-CM

## 2020-06-25 LAB — CLOSTRIDIUM DIFFICILE BY PCR: Toxigenic C. Difficile by PCR: NEGATIVE

## 2020-06-26 LAB — GI PROFILE, STOOL, PCR

## 2020-06-26 NOTE — Telephone Encounter (Signed)
Patient will be here as scheduled.

## 2020-06-26 NOTE — Telephone Encounter (Signed)
Ok, await the GI pathogen panel, if negative for infection I will discuss other treatment options at follow up visit that is coming up soon. Thanks

## 2020-06-26 NOTE — Telephone Encounter (Signed)
Patient wanting to be certain that the diarrhea turning green was not significant. States she feels the same. No pain. Afebrile. States her diet does not contain anything green at this time. GI pathogen panel is pending. The C-diff is negative.

## 2020-06-26 NOTE — Telephone Encounter (Signed)
Pt is requesting a call back from a nurse, pt states that recently her diarrhea has turned the color green, pt wants to know if she needs immediate attention or if she can wait for her appt scheduled 11/15.

## 2020-06-30 ENCOUNTER — Ambulatory Visit: Payer: Medicare PPO | Admitting: Gastroenterology

## 2020-06-30 ENCOUNTER — Other Ambulatory Visit: Payer: Medicare PPO

## 2020-06-30 ENCOUNTER — Encounter: Payer: Self-pay | Admitting: Gastroenterology

## 2020-06-30 VITALS — BP 102/62 | HR 51 | Ht 65.5 in | Wt 162.0 lb

## 2020-06-30 DIAGNOSIS — K52839 Microscopic colitis, unspecified: Secondary | ICD-10-CM

## 2020-06-30 DIAGNOSIS — K52831 Collagenous colitis: Secondary | ICD-10-CM | POA: Diagnosis not present

## 2020-06-30 DIAGNOSIS — M6289 Other specified disorders of muscle: Secondary | ICD-10-CM

## 2020-06-30 DIAGNOSIS — R152 Fecal urgency: Secondary | ICD-10-CM

## 2020-06-30 DIAGNOSIS — K529 Noninfective gastroenteritis and colitis, unspecified: Secondary | ICD-10-CM

## 2020-06-30 DIAGNOSIS — R159 Full incontinence of feces: Secondary | ICD-10-CM

## 2020-06-30 MED ORDER — BUDESONIDE ER 9 MG PO TB24: ORAL_TABLET | ORAL | 2 refills | Status: DC

## 2020-06-30 NOTE — Patient Instructions (Addendum)
Your provider has requested that you go to the basement level for lab work before leaving today. Press "B" on the elevator. The lab is located at the first door on the left as you exit the elevator.  We will refer you to Pelvic Floor therapy and they will contact you with that appointment   Take Benefiber 1 tablespoon three times a day with meals  We will send Budesonide to your pharmacy   Follow up in 3 months    Lactose-Free Diet, Adult If you have lactose intolerance, you are not able to digest lactose. Lactose is a natural sugar found mainly in dairy milk and dairy products. You may need to avoid all foods and beverages that contain lactose. A lactose-free diet can help you do this. Which foods have lactose? Lactose is found in dairy milk and dairy products, such as:  Yogurt.  Cheese.  Butter.  Margarine.  Sour cream.  Cream.  Whipped toppings and nondairy creamers.  Ice cream and other dairy-based desserts. Lactose is also found in foods or products made with dairy milk or milk ingredients. To find out whether a food contains dairy milk or a milk ingredient, look at the ingredients list. Avoid foods with the statement "May contain milk" and foods that contain:  Milk powder.  Whey.  Curd.  Caseinate.  Lactose.  Lactalbumin.  Lactoglobulin. What are alternatives to dairy milk and foods made with milk products?  Lactose-free milk.  Soy milk with added calcium and vitamin D.  Almond milk, coconut milk, rice milk, or other nondairy milk alternatives with added calcium and vitamin D. Note that these are low in protein.  Soy products, such as soy yogurt, soy cheese, soy ice cream, and soy-based sour cream.  Other nut milk products, such as almond yogurt, almond cheese, cashew yogurt, cashew cheese, cashew ice cream, coconut yogurt, and coconut ice cream. What are tips for following this plan?  Do not consume foods, beverages, vitamins, minerals, or medicines  containing lactose. Read ingredient lists carefully.  Look for the words "lactose-free" on labels.  Use lactase enzyme drops or tablets as directed by your health care provider.  Use lactose-free milk or a milk alternative, such as soy milk or almond milk, for drinking and cooking.  Make sure you get enough calcium and vitamin D in your diet. A lactose-free eating plan can be lacking in these important nutrients.  Take calcium and vitamin D supplements as directed by your health care provider. Talk to your health care provider about supplements if you are not able to get enough calcium and vitamin D from food. What foods can I eat?  Fruits All fresh, canned, frozen, or dried fruits that are not processed with lactose. Vegetables All fresh, frozen, and canned vegetables without cheese, cream, or butter sauces. Grains Any that are not made with dairy milk or dairy products. Meats and other proteins Any meat, fish, poultry, and other protein sources that are not made with dairy milk or dairy products. Soy cheese and yogurt. Fats and oils Any that are not made with dairy milk or dairy products. Beverages Lactose-free milk. Soy, rice, or almond milk with added calcium and vitamin D. Fruit and vegetable juices. Sweets and desserts Any that are not made with dairy milk or dairy products. Seasonings and condiments Any that are not made with dairy milk or dairy products. Calcium Calcium is found in many foods that contain lactose and is important for bone health. The amount of calcium you  need depends on your age:  Adults younger than 50 years: 1,000 mg of calcium a day.  Adults older than 50 years: 1,200 mg of calcium a day. If you are not getting enough calcium, you may get it from other sources, including:  Orange juice with calcium added. There are 300-350 mg of calcium in 1 cup of orange juice.  Calcium-fortified soy milk. There are 300-400 mg of calcium in 1 cup of  calcium-fortified soy milk.  Calcium-fortified rice or almond milk. There are 300 mg of calcium in 1 cup of calcium-fortified rice or almond milk.  Calcium-fortified breakfast cereals. There are 100-1,000 mg of calcium in calcium-fortified breakfast cereals.  Spinach, cooked. There are 145 mg of calcium in  cup of cooked spinach.  Edamame, cooked. There are 130 mg of calcium in  cup of cooked edamame.  Collard greens, cooked. There are 125 mg of calcium in  cup of cooked collard greens.  Kale, frozen or cooked. There are 90 mg of calcium in  cup of cooked or frozen kale.  Almonds. There are 95 mg of calcium in  cup of almonds.  Broccoli, cooked. There are 60 mg of calcium in 1 cup of cooked broccoli. The items listed above may not be a complete list of recommended foods and beverages. Contact a dietitian for more options. What foods are not recommended? Fruits None, unless they are made with dairy milk or dairy products. Vegetables None, unless they are made with dairy milk or dairy products. Grains Any grains that are made with dairy milk or dairy products. Meats and other proteins None, unless they are made with dairy milk or dairy products. Dairy All dairy products, including milk, goat's milk, buttermilk, kefir, acidophilus milk, flavored milk, evaporated milk, condensed milk, dulce de [place], eggnog, yogurt, cheese, and cheese spreads. Fats and oils Any that are made with milk or milk products. Margarines and salad dressings that contain milk or cheese. Cream. Half and half. Cream cheese. Sour cream. Chip dips made with sour cream or yogurt. Beverages Hot chocolate. Cocoa with lactose. Instant iced teas. Powdered fruit drinks. Smoothies made with dairy milk or yogurt. Sweets and desserts Any that are made with milk or milk products. Seasonings and condiments Chewing gum that has lactose. Spice blends if they contain lactose. Artificial sweeteners that contain lactose.  Nondairy creamers. The items listed above may not be a complete list of foods and beverages to avoid. Contact a dietitian for more information. Summary  If you are lactose intolerant, it means that you have a hard time digesting lactose, a natural sugar found in milk and milk products.  Following a lactose-free diet can help you manage this condition.  Calcium is important for bone health and is found in many foods that contain lactose. Talk with your health care provider about other sources of calcium. This information is not intended to replace advice given to you by your health care provider. Make sure you discuss any questions you have with your health care provider. Document Revised: 08/30/2017 Document Reviewed: 08/30/2017 Elsevier Patient Education  2020 [employer].

## 2020-06-30 NOTE — Progress Notes (Signed)
[name] [name]    [id number]    1941-04-08  Primary Care Physician:Burns, Claudina Lick, MD  Referring Physician: Binnie Rail, MD [place],  [place] [address]   Chief complaint: Diarrhea HPI: 79 year old very pleasant female with history of A. fib on Eliquis, family history of colon cancer and collagenous colitis here with complaints of recurrent chronic diarrhea.  She feels the symptoms now are different than what she was experienced when she was initially diagnosed with collagenous colitis.  She is having nocturnal symptoms, wakes up middle of night with fecal urgency Symptoms have somewhat improved since she has switched her diet to mostly rice-based, is avoiding gluten, trying more bland diet.  Denies any abdominal pain, blood in stool or rectal bleeding. GI stool pathogen panel is negative for any GI pathogen or C. Difficile  No recent antibiotics, travel history, or change in medications  Colonoscopy October 24, 2018: 11 mm polyp removed from cecum, sigmoid diverticulosis and internal hemorrhoids EGD September 09, 2016: Pedunculated gastric polyp removed with hot snare, gastric biopsies negative for H. pylori otherwise unremarkable exam Colonoscopy September 04, 2015 2 sessile polyps in cecum removed with hot snare  Outpatient Encounter Medications as of 06/30/2020  Medication Sig  . bisoprolol (ZEBETA) 10 MG tablet TAKE 1 TABLET BY MOUTH ONCE DAILY.  Marland Kitchen diltiazem (CARDIZEM CD) 360 MG 24 hr capsule TAKE (1) CAPSULE BY MOUTH ONCE DAILY.  Marland Kitchen Doxylamine Succinate, Sleep, (UNISOM PO) Take by mouth at bedtime as needed.  Marland Kitchen ELIQUIS 5 MG TABS tablet TAKE 1 TABLET BY MOUTH TWICE DAILY  . famotidine (PEPCID) 20 MG tablet Take 20 mg by mouth daily.   . furosemide (LASIX) 20 MG tablet Take 1 tablet (20 mg total) by mouth daily. (Patient not taking: Reported on 06/30/2020)  . spironolactone (ALDACTONE) 25 MG tablet TAKE 1 TABLET BY MOUTH ONCE DAILY.   No  facility-administered encounter medications on file as of 06/30/2020.    Allergies as of 06/30/2020 - Review Complete 06/30/2020  Allergen Reaction Noted  . Augmentin [amoxicillin-pot clavulanate] Diarrhea 07/14/2015  . Levaquin [levofloxacin in d5w]  10/08/2014  . Polysporin [bacitracin-polymyxin b]  05/29/2015  . Pravastatin Other (See Comments) 11/24/2015  . Sulfa antibiotics  01/18/2020  . Sulfonamide derivatives Hives   . Erythromycin Swelling and Rash     Past Medical History:  Diagnosis Date  . Allergic rhinitis    takes Benadryl at bedtime  . Allergy   . Anemia   . Angio-edema   . Arthritis    osteoarthritis  . Atrial fibrillation ([place]) 06-10-2011   takes Diltiazem and Pradaxa daily  . Cancer ([place])    skin  . Cataract    bilateral cataracts removed  . Clotting disorder ([place])    on PRADAXA - chronic a fib  . Collagenous colitis    recurrent, takes Budesonide daily as needed   . Colon polyps 01/14/2010   Hyperplastic  . Cough    states every day of her life and every chest xray is always clear  . GERD (gastroesophageal reflux disease)    takes Omeprazole daily  . History of poliomyelitis    Polio  age 32- no significant neuromuscular deficit  . Hyperlipidemia    was on Pravastatin but joint pain;has been off for 2-62months ;takes CoQ10  . Hypertension    takes Bisoprolol and Chlorthalidone daily  . Potassium deficiency    takes [facility] daily  Past Surgical History:  Procedure Laterality Date  . ADENOIDECTOMY    . APPENDECTOMY     at age 89  . BREAST BIOPSY  1947 & 10/07   Benign  . CARDIOVERSION N/A 11/05/2016   Procedure: CARDIOVERSION;  Surgeon: Sueanne Margarita, MD;  Location: [facility];  Service: Cardiovascular;  Laterality: N/A;  . cataracts removed    . COLONOSCOPY    . ENDOVENOUS ABLATION SAPHENOUS VEIN W/ LASER  03-03-2011 left greater saphenous vein   . ENDOVENOUS ABLATION SAPHENOUS VEIN W/ LASER  01-06-2011  right greater saphenous vein  .  epidural infection     x 2   . IR RADIOLOGIST EVAL & MGMT  03/07/2017  . SKIN CANCER EXCISION     removal of skin cancer on left leg  . TONSILLECTOMY     as a child  . TONSILLECTOMY    . TOTAL KNEE ARTHROPLASTY Right 09/18/2013   Procedure: TOTAL KNEE ARTHROPLASTY;  Surgeon: Garald Balding, MD;  Location: [place];  Service: Orthopedics;  Laterality: Right;    Family History  Problem Relation Age of Onset  . Stomach cancer Maternal Grandfather   . Heart disease Paternal Grandfather   . Colon cancer Son   . Rectal cancer Son 29  . Colon cancer Paternal Grandmother   . Alcohol abuse Father   . Heart disease Father   . Stroke Father   . Hypertension Father   . Hypertension Sister   . Other Sister        varicose veins  . Peripheral vascular disease Mother   . Other Mother        varicose veins  . Esophageal cancer Neg Hx   . Pancreatic cancer Neg Hx     Social History   Socioeconomic History  . Marital status: Married    Spouse name: Not on file  . Number of children: 2  . Years of education: Not on file  . Highest education level: Not on file  Occupational History  . Occupation: retired  Tobacco Use  . Smoking status: Former Smoker    Packs/day: 1.00    Years: 18.00    Pack years: 18.00    Types: Cigarettes    Quit date: 08/17/1987    Years since quitting: 32.8  . Smokeless tobacco: Never Used  . Tobacco comment: quit smoking 58yrs ago  Vaping Use  . Vaping Use: Never used  Substance and Sexual Activity  . Alcohol use: Yes    Alcohol/week: 7.0 standard drinks    Types: 7 Glasses of wine per week    Comment: glass of wine daily  . Drug use: No  . Sexual activity: Not Currently    Partners: Male    Birth control/protection: Surgical  Other Topics Concern  . Not on file  Social History Narrative   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   Married     Former Smoker quit 1989    Alcohol use-yes -  glasses of wine/day      son works for [employer] - she flies  to [place] to visit her grandchildren   Social Determinants of Radio broadcast assistant Strain:   . Difficulty of Paying Living Expenses: Not on file  Food Insecurity:   . Worried About Charity fundraiser in the Last Year: Not on file  . Ran Out of Food in the Last Year: Not on file  Transportation Needs:   . Lack of Transportation (  Medical): Not on file  . Lack of Transportation (Non-Medical): Not on file  Physical Activity:   . Days of Exercise per Week: Not on file  . Minutes of Exercise per Session: Not on file  Stress:   . Feeling of Stress : Not on file  Social Connections:   . Frequency of Communication with Friends and Family: Not on file  . Frequency of Social Gatherings with Friends and Family: Not on file  . Attends Religious Services: Not on file  . Active Member of Clubs or Organizations: Not on file  . Attends Archivist Meetings: Not on file  . Marital Status: Not on file  Intimate Partner Violence:   . Fear of Current or Ex-Partner: Not on file  . Emotionally Abused: Not on file  . Physically Abused: Not on file  . Sexually Abused: Not on file      Review of systems: All other review of systems negative except as mentioned in the HPI.   Physical Exam: Vitals:   06/30/20 1405  BP: 102/62  Pulse: (!) 51  SpO2: 96%   Body mass index is 26.55 kg/m. Gen:      No acute distress HEENT:  sclera anicteric Abd:      soft, non-tender; no palpable masses, no distension Ext:    No edema Neuro: alert and oriented x 3 Psych: normal mood and affect  Data Reviewed:  Reviewed labs, radiology imaging, old records and pertinent past GI work up   Assessment and Plan/Recommendations:  79 year old very pleasant female with history of A. fib on Eliquis, family history of colon cancer, personal history of multiple adenomatous colon polyps and collagenous colitis  Chronic diarrhea: Per patient her symptoms are not similar to when she was diagnosed  with collagenous colitis, she is concerned about possible food intolerance. We will do a trial of lactose-free diet for 1 week Add Benefiber 1 tablespoon 3 times daily with meals Check fecal elastase and fecal fat to exclude pancreatic insufficiency Check TTG IgA antibody to exclude celiac disease (her son had gluten intolerance when he was a child) Start budesonide 9 mg daily for 2 months followed by taper with 6 mg daily for a month and 3 mg daily for a month if continues to have diarrhea despite dietary changes for possible recurrent collagenous colitis If continues to have persistent symptoms or worsening symptoms, will plan for repeat colonoscopy with biopsies  Fecal urgency and incontinence with pelvic floor dysfunction: We will refer to pelvic floor physical therapy for biofeedback  Due for surveillance colonoscopy March 2023  Return in 3 months or sooner if needed  The patient was provided an opportunity to ask questions and all were answered. The patient agreed with the plan and demonstrated an understanding of the instructions.  Damaris Hippo , MD    CC: Binnie Rail, MD

## 2020-07-01 LAB — TISSUE TRANSGLUTAMINASE ABS,IGG,IGA
(tTG) Ab, IgA: <1 U/mL
(tTG) Ab, IgG: <1 U/mL

## 2020-07-01 LAB — IGA: Immunoglobulin A: 172 mg/dL (ref 70–320)

## 2020-07-02 ENCOUNTER — Telehealth: Payer: Self-pay | Admitting: Gastroenterology

## 2020-07-02 ENCOUNTER — Encounter: Payer: Self-pay | Admitting: Gastroenterology

## 2020-07-02 NOTE — Telephone Encounter (Signed)
Patient called states the pharmacy contacted her about the Budesonide needing prior auth not sure if the office was notified please advise

## 2020-07-02 NOTE — Telephone Encounter (Signed)
Called patient to inform Budesonide approved until 08/27/2020  Sent approval letter to be scanned in

## 2020-07-02 NOTE — Telephone Encounter (Signed)
We are working on Prior auth through [employer] [facility] [employer] today

## 2020-07-14 ENCOUNTER — Other Ambulatory Visit: Payer: Medicare PPO

## 2020-07-14 DIAGNOSIS — K529 Noninfective gastroenteritis and colitis, unspecified: Secondary | ICD-10-CM

## 2020-07-14 DIAGNOSIS — R197 Diarrhea, unspecified: Secondary | ICD-10-CM

## 2020-07-14 DIAGNOSIS — K52831 Collagenous colitis: Secondary | ICD-10-CM | POA: Diagnosis not present

## 2020-07-16 LAB — GI PROFILE, STOOL, PCR

## 2020-07-16 LAB — FECAL FAT, QUALITATIVE
Fat Qual Neutral, Stl: NORMAL
Fat Qual Total, Stl: NORMAL

## 2020-07-21 LAB — FECAL LACTOFERRIN, QUANT
Fecal Lactoferrin: NEGATIVE
MICRO NUMBER:: 11251748
SPECIMEN QUALITY:: ADEQUATE

## 2020-07-21 LAB — PANCREATIC ELASTASE, FECAL: Pancreatic Elastase-1, Stool: >500 mcg/g

## 2020-08-13 ENCOUNTER — Telehealth: Payer: Self-pay | Admitting: Gastroenterology

## 2020-08-13 NOTE — Telephone Encounter (Signed)
Pt would like to know the contact number for the Physical Therapist that she ws referred to. She made an appt and realized after that she has a conflict that day. Pt stated that the phone that she was given [phone]) is a residence number so she is not sure if she wrote down the wrong number. Pls call her.

## 2020-08-14 NOTE — Telephone Encounter (Signed)
Patient notified of the number to [facility].  All questions answered.  She will call back for any additional details or concerns.

## 2020-08-21 ENCOUNTER — Telehealth: Payer: Self-pay | Admitting: Gastroenterology

## 2020-08-21 NOTE — Telephone Encounter (Signed)
Inbound call from patient requesting to change the Budesonide ER medication to where she can take it 3 times a day instead.

## 2020-08-22 ENCOUNTER — Ambulatory Visit: Payer: Medicare PPO | Admitting: Physical Therapy

## 2020-08-22 MED ORDER — BUDESONIDE 3 MG PO CPEP: 3.0000 mg | ORAL_CAPSULE | Freq: Three times a day (TID) | ORAL | 0 refills | Status: DC

## 2020-08-22 NOTE — Telephone Encounter (Signed)
Dr Silverio Decamp please advise. Patient wants to take the 9 mg over the course of the day instead all at once??

## 2020-08-22 NOTE — Telephone Encounter (Signed)
Its fine if she prefers to do 3mg  TID instead of 9mg  daily. Thanks

## 2020-08-22 NOTE — Telephone Encounter (Signed)
Called patient to inform, she needed refills for the 3mg  budesonide instead of 9 mg   Sent that in for patient

## 2020-08-23 ENCOUNTER — Other Ambulatory Visit: Payer: Self-pay | Admitting: Cardiology

## 2020-09-04 ENCOUNTER — Telehealth: Payer: Self-pay | Admitting: Cardiology

## 2020-09-04 NOTE — Telephone Encounter (Signed)
Patient called and mentioned how she doesn't feel good. Feels like she is deterioriating. Can barely walk up steps and has to stop doing chores after about 5 min. Sometimes feels like chest is going to explode. Needs to speak with Dr. Stanford Breed or nurse about how she's been feeling

## 2020-09-04 NOTE — Telephone Encounter (Signed)
Spoke to patient she stated for the past 4 weeks her health has been declining.Stated if she walks any distance she is sob,chest feels full.She is weak,unsteady,shaky.Appointment scheduled with Dr.Crenshaw 09/05/20 at 11:00 am.

## 2020-09-05 ENCOUNTER — Ambulatory Visit: Payer: Medicare PPO | Admitting: Cardiology

## 2020-09-05 ENCOUNTER — Other Ambulatory Visit: Payer: Self-pay

## 2020-09-05 ENCOUNTER — Ambulatory Visit (INDEPENDENT_AMBULATORY_CARE_PROVIDER_SITE_OTHER): Payer: Medicare PPO

## 2020-09-05 ENCOUNTER — Encounter: Payer: Self-pay | Admitting: Cardiology

## 2020-09-05 ENCOUNTER — Encounter: Payer: Self-pay | Admitting: Radiology

## 2020-09-05 VITALS — BP 128/84 | HR 86 | Ht 65.5 in | Wt 163.8 lb

## 2020-09-05 DIAGNOSIS — R0609 Other forms of dyspnea: Secondary | ICD-10-CM

## 2020-09-05 DIAGNOSIS — I4821 Permanent atrial fibrillation: Secondary | ICD-10-CM | POA: Diagnosis not present

## 2020-09-05 DIAGNOSIS — R0600 Dyspnea, unspecified: Secondary | ICD-10-CM | POA: Diagnosis not present

## 2020-09-05 DIAGNOSIS — R609 Edema, unspecified: Secondary | ICD-10-CM

## 2020-09-05 DIAGNOSIS — I10 Essential (primary) hypertension: Secondary | ICD-10-CM | POA: Diagnosis not present

## 2020-09-05 DIAGNOSIS — R072 Precordial pain: Secondary | ICD-10-CM

## 2020-09-05 LAB — CBC WITH DIFFERENTIAL/PLATELET
Basophils Absolute: 0 10*3/uL (ref 0.0–0.2)
Basos: 0 %
EOS (ABSOLUTE): 0.1 10*3/uL (ref 0.0–0.4)
Eos: 1 %
Hematocrit: 35.3 % (ref 34.0–46.6)
Hemoglobin: 12 g/dL (ref 11.1–15.9)
Immature Grans (Abs): 0.1 10*3/uL (ref 0.0–0.1)
Immature Granulocytes: 1 %
Lymphocytes Absolute: 2 10*3/uL (ref 0.7–3.1)
Lymphs: 18 %
MCH: 34.7 pg — ABNORMAL HIGH (ref 26.6–33.0)
MCHC: 34 g/dL (ref 31.5–35.7)
MCV: 102 fL — ABNORMAL HIGH (ref 79–97)
Monocytes Absolute: 1 10*3/uL — ABNORMAL HIGH (ref 0.1–0.9)
Monocytes: 10 %
Neutrophils Absolute: 7.7 10*3/uL — ABNORMAL HIGH (ref 1.4–7.0)
Neutrophils: 70 %
Platelets: 323 10*3/uL (ref 150–450)
RBC: 3.46 x10E6/uL — ABNORMAL LOW (ref 3.77–5.28)
RDW: 13.2 % (ref 11.7–15.4)
WBC: 10.9 10*3/uL — ABNORMAL HIGH (ref 3.4–10.8)

## 2020-09-05 LAB — BASIC METABOLIC PANEL
BUN/Creatinine Ratio: 28 (ref 12–28)
BUN: 25 mg/dL (ref 8–27)
CO2: 23 mmol/L (ref 20–29)
Calcium: 9.5 mg/dL (ref 8.7–10.3)
Chloride: 102 mmol/L (ref 96–106)
Creatinine, Ser: 0.88 mg/dL (ref 0.57–1.00)
GFR calc Af Amer: 72 mL/min/{1.73_m2} (ref >59)
GFR calc non Af Amer: 63 mL/min/{1.73_m2} (ref >59)
Glucose: 81 mg/dL (ref 65–99)
Potassium: 5.2 mmol/L (ref 3.5–5.2)
Sodium: 138 mmol/L (ref 134–144)

## 2020-09-05 LAB — TSH: TSH: 2.13 u[IU]/mL (ref 0.450–4.500)

## 2020-09-05 NOTE — Patient Instructions (Signed)
Medication Instructions:  Your physician recommends that you continue on your current medications as directed. Please refer to the Current Medication list given to you today.  *If you need a refill on your cardiac medications before your next appointment, please call your pharmacy*  Lab Work: CBC/BMET/TSH TODAY   If you have labs (blood work) drawn today and your tests are completely normal, you will receive your results only by: Marland Kitchen MyChart Message (if you have MyChart) OR . A paper copy in the mail If you have any lab test that is abnormal or we need to change your treatment, we will call you to review the results.  Testing/Procedures: Your physician has requested that you have an echocardiogram. Echocardiography is a painless test that uses sound waves to create images of your heart. It provides your doctor with information about the size and shape of your heart and how well your heart's chambers and valves are working. This procedure takes approximately one hour. There are no restrictions for this procedure.  Your physician has requested that you have a lexiscan myoview. For further information please visit [email or web address]. Please follow instruction sheet, as given.  3 DAY ZIO MONITOR   Follow-Up: At [facility], you and your health needs are our priority.  As part of our continuing mission to provide you with exceptional heart care, we have created designated Provider Care Teams.  These Care Teams include your primary Cardiologist (physician) and Advanced Practice Providers (APPs -  Physician Assistants and Nurse Practitioners) who all work together to provide you with the care you need, when you need it.  We recommend signing up for the patient portal called "MyChart".  Sign up information is provided on this After Visit Summary.  MyChart is used to connect with patients for Virtual Visits (Telemedicine).  Patients are able to view lab/test results, encounter notes, upcoming  appointments, etc.  Non-urgent messages can be sent to your provider as well.   To learn more about what you can do with MyChart, go to [email or web address].    Your next appointment:   6-8  week(s)  The format for your next appointment:   In Person  Provider:   You will see one of the following Advanced Practice Providers on your designated Care Team:    Kerin Ransom, PA-C  [place], [place]  Coletta Memos, [place]  Then, [name], MD will plan to see you again in 4 month(s).  Other Instructions  ZIO XT- Long Term Monitor Instructions   Your physician has requested you wear your ZIO patch monitor____3___days.   This is a single patch monitor.  [employer] supplies one patch monitor per enrollment.  Additional stickers are not available.   Please do not apply patch if you will be having a Nuclear Stress Test, Echocardiogram, Cardiac CT, MRI, or Chest Xray during the time frame you would be wearing the monitor. The patch cannot be worn during these tests.  You cannot remove and re-apply the ZIO XT patch monitor.   Your ZIO patch monitor will be sent USPS Priority mail from [facility] directly to your home address. The monitor may also be mailed to a PO BOX if home delivery is not available.   It may take 3-5 days to receive your monitor after you have been enrolled.   Once you have received you monitor, please review enclosed instructions.  Your monitor has already been registered assigning a specific monitor serial # to you.   Applying the monitor   Shave  hair from upper left chest.   Hold abrader disc by orange tab.  Rub abrader in 40 strokes over left upper chest as indicated in your monitor instructions.   Clean area with 4 enclosed alcohol pads .  Use all pads to assure are is cleaned thoroughly.  Let dry.   Apply patch as indicated in monitor instructions.  Patch will be place under collarbone on left side of chest with arrow pointing upward.   Rub patch  adhesive wings for 2 minutes.Remove white label marked "1".  Remove white label marked "2".  Rub patch adhesive wings for 2 additional minutes.   While looking in a mirror, press and release button in center of patch.  A small green light will flash 3-4 times .  This will be your only indicator the monitor has been turned on.     Do not shower for the first 24 hours.  You may shower after the first 24 hours.   Press button if you feel a symptom. You will hear a small click.  Record Date, Time and Symptom in the Patient Log Book.   When you are ready to remove patch, follow instructions on last 2 pages of Patient Log Book.  Stick patch monitor onto last [place] of Patient Log Book.   Place Patient Log Book in [place] box.  Use locking tab on box and tape box closed securely.  The [employer] has [employer] on it.  Please place in mailbox as soon as possible.  Your physician should have your test results approximately 7 days after the monitor has been mailed back to [facility].   Call [place] at [phone] if you have questions regarding your ZIO XT patch monitor.  Call them immediately if you see an orange light blinking on your monitor.   If your monitor falls off in less than 4 days contact our Monitor department at [phone].  If your monitor becomes loose or falls off after 4 days call [employer] at [phone] for suggestions on securing your monitor.

## 2020-09-05 NOTE — Progress Notes (Signed)
Enrolled patient for a 3 day Zio XT to be mailed to patients home.  

## 2020-09-05 NOTE — Progress Notes (Signed)
HPI: FU AFib. Nuclear studyNov 2012 showed EF 69 and normal perfusion. Holter monitor December 2017 showed atrial fibrillation with rate mildly elevated.  Previously had cardioversion but did not hold sinus rhythm and now atrial fibrillation is managed with rate control and anticoagulation.  Echocardiogram June 2020 showed normal LV function, severe left atrial enlargement, moderate tricuspid regurgitation and mild aortic insufficiency.  Since I last saw her, she describes increased dyspnea on exertion and significant fatigue.  She states she feels like she will "collapse" with activities.  She denies orthopnea or PND.  Chronic mild pedal edema unchanged.  She notes chest pressure with activities.  Current Outpatient Medications  Medication Sig Dispense Refill  . bisoprolol (ZEBETA) 10 MG tablet TAKE 1 TABLET BY MOUTH ONCE DAILY. 90 tablet 0  . budesonide (ENTOCORT EC) 3 MG 24 hr capsule Take 1 capsule (3 mg total) by mouth in the morning, at noon, and at bedtime. 90 capsule 0  . Budesonide ER 9 MG TB24 One 9 mg tablets daily 30 tablet 2  . diltiazem (CARDIZEM CD) 360 MG 24 hr capsule TAKE (1) CAPSULE BY MOUTH ONCE DAILY. 90 capsule 1  . Doxylamine Succinate, Sleep, (UNISOM PO) Take by mouth at bedtime as needed.    Marland Kitchen ELIQUIS 5 MG TABS tablet TAKE 1 TABLET BY MOUTH TWICE DAILY 180 tablet 1  . famotidine (PEPCID) 20 MG tablet Take 20 mg by mouth daily.     . furosemide (LASIX) 20 MG tablet Take 1 tablet (20 mg total) by mouth daily. (Patient not taking: Reported on 06/30/2020) 90 tablet 2  . spironolactone (ALDACTONE) 25 MG tablet TAKE 1 TABLET BY MOUTH ONCE DAILY. 90 tablet 0   No current facility-administered medications for this visit.     Past Medical History:  Diagnosis Date  . Allergic rhinitis    takes Benadryl at bedtime  . Allergy   . Anemia   . Angio-edema   . Arthritis    osteoarthritis  . Atrial fibrillation ([place]) 06-10-2011   takes Diltiazem and Pradaxa daily  .  Cancer ([place])    skin  . Cataract    bilateral cataracts removed  . Clotting disorder ([place])    on PRADAXA - chronic a fib  . Collagenous colitis    recurrent, takes Budesonide daily as needed   . Colon polyps 01/14/2010   Hyperplastic  . Cough    states every day of her life and every chest xray is always clear  . GERD (gastroesophageal reflux disease)    takes Omeprazole daily  . History of poliomyelitis    Polio  age 61- no significant neuromuscular deficit  . Hyperlipidemia    was on Pravastatin but joint pain;has been off for 2-78months ;takes CoQ10  . Hypertension    takes Bisoprolol and Chlorthalidone daily  . Potassium deficiency    takes KDUR daily    Past Surgical History:  Procedure Laterality Date  . ADENOIDECTOMY    . APPENDECTOMY     at age 65  . BREAST BIOPSY  1947 & 10/07   Benign  . CARDIOVERSION N/A 11/05/2016   Procedure: CARDIOVERSION;  Surgeon: Sueanne Margarita, MD;  Location: [facility];  Service: Cardiovascular;  Laterality: N/A;  . cataracts removed    . COLONOSCOPY    . ENDOVENOUS ABLATION SAPHENOUS VEIN W/ LASER  03-03-2011 left greater saphenous vein   . ENDOVENOUS ABLATION SAPHENOUS VEIN W/ LASER  01-06-2011  right greater saphenous vein  . epidural  infection     x 2   . IR RADIOLOGIST EVAL & MGMT  03/07/2017  . SKIN CANCER EXCISION     removal of skin cancer on left leg  . TONSILLECTOMY     as a child  . TONSILLECTOMY    . TOTAL KNEE ARTHROPLASTY Right 09/18/2013   Procedure: TOTAL KNEE ARTHROPLASTY;  Surgeon: Garald Balding, MD;  Location: [place];  Service: Orthopedics;  Laterality: Right;    Social History   Socioeconomic History  . Marital status: Married    Spouse name: Not on file  . Number of children: 2  . Years of education: Not on file  . Highest education level: Not on file  Occupational History  . Occupation: retired  Tobacco Use  . Smoking status: Former Smoker    Packs/day: 1.00    Years: 18.00    Pack years: 18.00     Types: Cigarettes    Quit date: 08/17/1987    Years since quitting: 33.0  . Smokeless tobacco: Never Used  . Tobacco comment: quit smoking 79yrs ago  Vaping Use  . Vaping Use: Never used  Substance and Sexual Activity  . Alcohol use: Yes    Alcohol/week: 7.0 standard drinks    Types: 7 Glasses of wine per week    Comment: glass of wine daily  . Drug use: No  . Sexual activity: Not Currently    Partners: Male    Birth control/protection: Surgical  Other Topics Concern  . 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 Stomach cancer Maternal Grandfather   . Heart disease Paternal Grandfather   . Colon cancer Son   . Rectal cancer Son 61  . Colon cancer Paternal Grandmother   . Alcohol abuse Father   . Heart disease Father   . Stroke Father   . Hypertension Father   . Hypertension Sister   . Other Sister        varicose veins  . Peripheral vascular disease Mother   . Other Mother        varicose veins  . Esophageal cancer Neg Hx   . Pancreatic cancer Neg Hx     ROS: no fevers or chills, productive cough, hemoptysis, dysphasia, odynophagia, melena, hematochezia, dysuria, hematuria, rash, seizure activity, orthopnea, PND, pedal edema, claudication. Remaining systems are negative.  Physical Exam: Well-developed well-nourished in no acute distress.  Skin is warm and dry.  HEENT is normal.  Neck is supple.  Chest is clear to auscultation with normal expansion.   Cardiovascular exam is irregular Abdominal exam nontender or distended. No masses palpated. Extremities show 1+ ankle edema. neuro grossly intact  ECG-atrial fibrillation at a rate of 86, no ST changes.  Personally reviewed  A/P  1 permanent atrial fibrillation-plan to continue present dose of beta-blocker and Cardizem for rate control.  Continue apixaban.  She is complaining of significant dyspnea on exertion and fatigue.  Question if her heart rate is inadequately controlled.  We will arrange 48-hour Holter monitor to further assess.  I will also arrange an echocardiogram to reassess LV function.  Check hemoglobin and renal function.  2 hypertension-blood pressure controlled.  Continue present medical regimen and follow.  3 hyperlipidemia-patient is intolerant to statins.  We will continue diet.  4 chronic pedal edema-reasonly well controlled.  Continue diuretic at present dose.  5 chest pressure-we will arrange a [place] nuclear study for risk stratification.  6 fatigue-we will check TSH to screen for hypothyroidism.  [name], MD

## 2020-09-10 ENCOUNTER — Other Ambulatory Visit: Payer: Self-pay | Admitting: *Deleted

## 2020-09-10 NOTE — Addendum Note (Signed)
Addended by: Alvina Filbert B on: 09/10/2020 08:12 AM   Modules accepted: Orders

## 2020-09-15 ENCOUNTER — Other Ambulatory Visit: Payer: Self-pay

## 2020-09-15 ENCOUNTER — Telehealth: Payer: Self-pay | Admitting: Cardiology

## 2020-09-15 ENCOUNTER — Ambulatory Visit (HOSPITAL_COMMUNITY): Payer: Medicare PPO | Attending: Cardiovascular Disease

## 2020-09-15 DIAGNOSIS — R0600 Dyspnea, unspecified: Secondary | ICD-10-CM | POA: Insufficient documentation

## 2020-09-15 DIAGNOSIS — R0609 Other forms of dyspnea: Secondary | ICD-10-CM

## 2020-09-15 LAB — ECHOCARDIOGRAM COMPLETE
P 1/2 time: 459 msec
S' Lateral: 2.6 cm

## 2020-09-15 NOTE — Telephone Encounter (Signed)
Spoke to patient informed patient- she should continue with medications . She will be having a [employer] 09/17/20.  She will not need to exercise to get her heart rate up.  RN also informed patient  a  person from the department will call to go over on instructionsfor test Patient voiced understanding.

## 2020-09-15 NOTE — Telephone Encounter (Signed)
Patient states she would like to know if she can take her bisoprolol and diltiazem prior to her stress test on 09/17/2020. She states last time they could not get her HR up.

## 2020-09-16 ENCOUNTER — Telehealth (HOSPITAL_COMMUNITY): Payer: Self-pay | Admitting: *Deleted

## 2020-09-16 NOTE — Telephone Encounter (Signed)
Close encounter 

## 2020-09-17 ENCOUNTER — Encounter: Payer: Self-pay | Admitting: Podiatry

## 2020-09-17 ENCOUNTER — Ambulatory Visit (HOSPITAL_COMMUNITY)
Admission: RE | Admit: 2020-09-17 | Discharge: 2020-09-17 | Disposition: A | Discharge status: Home / Self Care | Payer: Medicare PPO | Source: Ambulatory Visit | Attending: Cardiovascular Disease | Admitting: Cardiovascular Disease

## 2020-09-17 ENCOUNTER — Ambulatory Visit (INDEPENDENT_AMBULATORY_CARE_PROVIDER_SITE_OTHER): Payer: Medicare PPO | Admitting: Podiatry

## 2020-09-17 ENCOUNTER — Inpatient Hospital Stay (HOSPITAL_COMMUNITY): Admission: RE | Admit: 2020-09-17 | Payer: Medicare PPO | Source: Ambulatory Visit

## 2020-09-17 ENCOUNTER — Ambulatory Visit: Payer: Medicare PPO

## 2020-09-17 ENCOUNTER — Other Ambulatory Visit: Payer: Self-pay

## 2020-09-17 DIAGNOSIS — I10 Essential (primary) hypertension: Secondary | ICD-10-CM | POA: Diagnosis not present

## 2020-09-17 DIAGNOSIS — M2041 Other hammer toe(s) (acquired), right foot: Secondary | ICD-10-CM | POA: Diagnosis not present

## 2020-09-17 DIAGNOSIS — L97511 Non-pressure chronic ulcer of other part of right foot limited to breakdown of skin: Secondary | ICD-10-CM

## 2020-09-17 DIAGNOSIS — L97512 Non-pressure chronic ulcer of other part of right foot with fat layer exposed: Secondary | ICD-10-CM

## 2020-09-17 DIAGNOSIS — I4821 Permanent atrial fibrillation: Secondary | ICD-10-CM | POA: Insufficient documentation

## 2020-09-17 LAB — MYOCARDIAL PERFUSION IMAGING
Peak HR: 98 {beats}/min
Rest HR: 80 {beats}/min
SDS: 3
SRS: 0
TID: 0.86

## 2020-09-17 MED ORDER — DOXYCYCLINE HYCLATE 100 MG PO TABS: 100.0000 mg | ORAL_TABLET | Freq: Two times a day (BID) | ORAL | 0 refills | Status: DC

## 2020-09-17 MED ORDER — TECHNETIUM TC 99M TETROFOSMIN IV KIT
10.2000 | PACK | Freq: Once | INTRAVENOUS | Status: AC | PRN
  Administered 2020-09-17: 10.2 via INTRAVENOUS
  Filled 2020-09-17: qty 11

## 2020-09-17 MED ORDER — REGADENOSON 0.4 MG/5ML IV SOLN
0.4000 mg | Freq: Once | INTRAVENOUS | Status: AC
  Administered 2020-09-17: 0.4 mg via INTRAVENOUS

## 2020-09-17 MED ORDER — TECHNETIUM TC 99M TETROFOSMIN IV KIT
30.9000 | PACK | Freq: Once | INTRAVENOUS | Status: AC | PRN
  Administered 2020-09-17: 30.9 via INTRAVENOUS
  Filled 2020-09-17: qty 31

## 2020-09-22 NOTE — Progress Notes (Signed)
Subjective:    Patient ID: [name] [name], female    DOB: [birth year], 80 y.o.   MRN: [id number]  HPI The patient is here for an acute visit.    She has had chronic leg swelling.  That is not new.  She watches her salt and wears compression socks. Her swelling was worse this past week-mostly this past few days.  It typically improves overnight and she has minimal swelling in the morning and now her legs are not back to normal in the morning.    1 week ago she was at her brothers house and she knows she consume more salt during that time.  She typically is very compliant with a low-sodium diet.  She wears her compression socks on a daily basis.  She has not been taking the lasix - she worries about her kidneys.  She is not really needed it in a while.  She did take 1 today.  She does have varicose veins and does follow with vascular, but has not seen them in a while.  She wanted to get a referral back to see them.  She has gained about 5 pounds recently and is concerned it is fluid weight.  She denies shortness of breath.  She had blood work done not long ago and it was okay.  She was on steroids for a flare of her colitis and she tapered herself off about 9 days ago.   Medications and allergies reviewed with patient and updated if appropriate.  Patient Active Problem List   Diagnosis Date Noted  . Bladder prolapse, female, acquired 11/12/2019  . Chronic sinusitis 12/06/2018  . Hyperglycemia 01/03/2017  . DDD (degenerative disc disease), lumbar 01/03/2017  . Bilateral leg edema 04/13/2016  . Osteoarthritis of right knee 09/21/2013  . S/P total knee replacement using cement 09/18/2013  . Anisocoria 07/26/2013  . Varicose veins of bilateral lower extremities with other complications 07/37/1062  . Paroxysmal Atrial Fibrillation 06/29/2011  . COUGH, CHRONIC 03/09/2010  . HIP PAIN, BILATERAL 07/17/2009  . ALLERGIC RHINITIS 04/23/2008  . GERD 11/01/2007  . Essential hypertension  09/20/2007  . SKIN CANCER, LEG 07/03/2007  . Hyperlipidemia 07/03/2007  . Colitis, collagenous 07/03/2007    Current Outpatient Medications on File Prior to Visit  Medication Sig Dispense Refill  . bisoprolol (ZEBETA) 10 MG tablet TAKE 1 TABLET BY MOUTH ONCE DAILY. 90 tablet 0  . budesonide (ENTOCORT EC) 3 MG 24 hr capsule Take 1 capsule (3 mg total) by mouth in the morning, at noon, and at bedtime. 90 capsule 0  . diltiazem (CARDIZEM CD) 360 MG 24 hr capsule TAKE (1) CAPSULE BY MOUTH ONCE DAILY. 90 capsule 1  . doxycycline (VIBRA-TABS) 100 MG tablet Take 1 tablet (100 mg total) by mouth 2 (two) times daily. 20 tablet 0  . Doxylamine Succinate, Sleep, (UNISOM PO) Take by mouth at bedtime as needed.    Marland Kitchen ELIQUIS 5 MG TABS tablet TAKE 1 TABLET BY MOUTH TWICE DAILY 180 tablet 1  . famotidine (PEPCID) 20 MG tablet Take 20 mg by mouth daily.    . furosemide (LASIX) 20 MG tablet Take 1 tablet (20 mg total) by mouth daily. 90 tablet 2  . spironolactone (ALDACTONE) 25 MG tablet TAKE 1 TABLET BY MOUTH ONCE DAILY. 90 tablet 0   No current facility-administered medications on file prior to visit.    Past Medical History:  Diagnosis Date  . Allergic rhinitis    takes Benadryl at bedtime  .  Allergy   . Anemia   . Angio-edema   . Arthritis    osteoarthritis  . Atrial fibrillation ([place]) 06-10-2011   takes Diltiazem and Pradaxa daily  . Cancer ([place])    skin  . Cataract    bilateral cataracts removed  . Clotting disorder ([place])    on PRADAXA - chronic a fib  . Collagenous colitis    recurrent, takes Budesonide daily as needed   . Colon polyps 01/14/2010   Hyperplastic  . Cough    states every day of her life and every chest xray is always clear  . GERD (gastroesophageal reflux disease)    takes Omeprazole daily  . History of poliomyelitis    Polio  age 47- no significant neuromuscular deficit  . Hyperlipidemia    was on Pravastatin but joint pain;has been off for 2-8months ;takes CoQ10   . Hypertension    takes Bisoprolol and Chlorthalidone daily  . Potassium deficiency    takes KDUR daily    Past Surgical History:  Procedure Laterality Date  . ADENOIDECTOMY    . APPENDECTOMY     at age 56  . BREAST BIOPSY  1947 & 10/07   Benign  . CARDIOVERSION N/A 11/05/2016   Procedure: CARDIOVERSION;  Surgeon: Sueanne Margarita, MD;  [facility];  Service: Cardiovascular;  Laterality: N/A;  . cataracts removed    . COLONOSCOPY    . ENDOVENOUS ABLATION SAPHENOUS VEIN W/ LASER  03-03-2011 left greater saphenous vein   . ENDOVENOUS ABLATION SAPHENOUS VEIN W/ LASER  01-06-2011  right greater saphenous vein  . epidural infection     x 2   . IR RADIOLOGIST EVAL & MGMT  03/07/2017  . SKIN CANCER EXCISION     removal of skin cancer on left leg  . TONSILLECTOMY     as a child  . TONSILLECTOMY    . TOTAL KNEE ARTHROPLASTY Right 09/18/2013   Procedure: TOTAL KNEE ARTHROPLASTY;  Surgeon: Garald Balding, MD;  Location: [place];  Service: Orthopedics;  Laterality: Right;    Social History   Socioeconomic History  . Marital status: Married    Spouse name: Not on file  . Number of children: 2  . Years of education: Not on file  . Highest education level: Not on file  Occupational History  . Occupation: retired  Tobacco Use  . Smoking status: Former Smoker    Packs/day: 1.00    Years: 18.00    Pack years: 18.00    Types: Cigarettes    Quit date: 08/17/1987    Years since quitting: 33.1  . Smokeless tobacco: Never Used  . Tobacco comment: quit smoking 65yrs ago  Vaping Use  . Vaping Use: Never used  Substance and Sexual Activity  . Alcohol use: Yes    Alcohol/week: 7.0 standard drinks    Types: 7 Glasses of wine per week    Comment: glass of wine daily  . Drug use: No  . Sexual activity: Not Currently    Partners: Male    Birth control/protection: Surgical  Other Topics Concern  . 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 Stomach cancer Maternal Grandfather   . Heart disease Paternal Grandfather   . Colon cancer Son   . Rectal cancer Son 63  . Colon cancer Paternal Grandmother   . Alcohol abuse Father   . Heart disease Father   . Stroke Father   . Hypertension Father   . Hypertension Sister   . Other Sister        varicose veins  . Peripheral vascular disease Mother   . Other Mother        varicose veins  . Esophageal cancer Neg Hx   . Pancreatic cancer Neg Hx     Review of Systems  Constitutional: Negative for fever.  Respiratory: Positive for wheezing. Negative for cough and shortness of breath.   Cardiovascular: Positive for leg swelling (chronic - slightly worse). Negative for chest pain and palpitations.  Neurological: Negative for headaches.       Objective:   Vitals:   09/23/20 1526  BP: 132/76  Pulse: (!) 101  Temp: 98.1 F (36.7 C)  SpO2: 98%   BP Readings from Last 3 Encounters:  09/23/20 132/76  09/05/20 128/84  06/30/20 102/62   Wt Readings from Last 3 Encounters:  09/23/20 167 lb (75.8 kg)  09/17/20 163 lb (73.9 kg)  09/05/20 163 lb 12.8 oz (74.3 kg)   Body mass index is 26.95 kg/m.   Physical Exam    Constitutional: Appears well-developed and well-nourished. No distress.  Head: Normocephalic and atraumatic.  Cardiovascular: Normal rate, regular rhythm and normal heart sounds.  No murmur heard. No carotid bruit .  1 + pitting edema b/l LE Pulmonary/Chest: Effort normal and breath sounds normal. No respiratory distress. No  has no wheezes. No rales.  Skin: Skin is warm and dry. Not diaphoretic. Mild erythema in LE from swelling       Assessment & Plan:    See Problem List for Assessment and Plan of chronic medical problems.    This visit occurred during the SARS-CoV-2 public health emergency.  Safety protocols were in place, including screening questions prior to the visit, additional usage of staff PPE, and extensive cleaning of exam room while observing appropriate contact time as indicated for disinfecting solutions.

## 2020-09-23 ENCOUNTER — Other Ambulatory Visit: Payer: Self-pay

## 2020-09-23 ENCOUNTER — Ambulatory Visit: Payer: Medicare PPO | Admitting: Internal Medicine

## 2020-09-23 ENCOUNTER — Encounter: Payer: Self-pay | Admitting: Internal Medicine

## 2020-09-23 ENCOUNTER — Encounter: Payer: Self-pay | Admitting: Podiatry

## 2020-09-23 VITALS — BP 132/76 | HR 101 | Temp 98.1°F | Ht 66.0 in | Wt 167.0 lb

## 2020-09-23 DIAGNOSIS — R600 Localized edema: Secondary | ICD-10-CM | POA: Diagnosis not present

## 2020-09-23 DIAGNOSIS — I83893 Varicose veins of bilateral lower extremities with other complications: Secondary | ICD-10-CM

## 2020-09-23 DIAGNOSIS — I4821 Permanent atrial fibrillation: Secondary | ICD-10-CM | POA: Diagnosis not present

## 2020-09-23 DIAGNOSIS — R0600 Dyspnea, unspecified: Secondary | ICD-10-CM | POA: Diagnosis not present

## 2020-09-23 NOTE — Assessment & Plan Note (Addendum)
Acute on chronic Currently not controlled I do not think there is any element of heart failure Related to varicose veins and venous insufficiency.  Increased swelling recently could also be contributed to recent increase in salt intake and recent course of steroids She did take Lasix today and advised her to take Lasix daily for a few days, which should get rid of some of the extra fluid We will order referral to Dr. Fuller [place] would like to check in with him We will follow-up at her physical in a couple of months

## 2020-09-23 NOTE — Progress Notes (Signed)
Subjective:  Patient ID: [name] [name], female    DOB: 1941/01/19,  MRN: [id number]  Chief Complaint  Patient presents with  . Toe Pain    2nd toe right - hammer toe deformity x years, developed corn, now feeling raw and noticed weeping, keeps covered with bandaid and cushion, wears soft shoes  . New Patient (Initial Visit)  . Nail Problem    Concerned about nail fungus - derm Rx'd ciclopirox    80 y.o. female presents for wound care.  Patient presents with right second digit ulceration down to fat layer.  Patient states that there was a hard callus on top of it which has been coming up and has been draining a little bit.  It been keeping covered with a Band-Aid and cushion which has not helped.  She states that this has been going on for quite some time.  She wants to make sure that is not infected.  She has not been taking any antibiotics she denies any other acute complaints she has been wearing soft shoes and cushion to protect it.  She denies any other acute issues.   Review of Systems: Negative except as noted in the HPI. Denies N/V/F/Ch.  Past Medical History:  Diagnosis Date  . Allergic rhinitis    takes Benadryl at bedtime  . Allergy   . Anemia   . Angio-edema   . Arthritis    osteoarthritis  . Atrial fibrillation ([place]) 06-10-2011   takes Diltiazem and Pradaxa daily  . Cancer ([facility])    skin  . Cataract    bilateral cataracts removed  . Clotting disorder ([place])    on PRADAXA - chronic a fib  . Collagenous colitis    recurrent, takes Budesonide daily as needed   . Colon polyps 01/14/2010   Hyperplastic  . Cough    states every day of her life and every chest xray is always clear  . GERD (gastroesophageal reflux disease)    takes Omeprazole daily  . History of poliomyelitis    Polio  age 32- no significant neuromuscular deficit  . Hyperlipidemia    was on Pravastatin but joint pain;has been off for 2-76months ;takes CoQ10  . Hypertension    takes Bisoprolol and  Chlorthalidone daily  . Potassium deficiency    takes KDUR daily    Current Outpatient Medications:  .  doxycycline (VIBRA-TABS) 100 MG tablet, Take 1 tablet (100 mg total) by mouth 2 (two) times daily., Disp: 20 tablet, Rfl: 0 .  bisoprolol (ZEBETA) 10 MG tablet, TAKE 1 TABLET BY MOUTH ONCE DAILY., Disp: 90 tablet, Rfl: 0 .  budesonide (ENTOCORT EC) 3 MG 24 hr capsule, Take 1 capsule (3 mg total) by mouth in the morning, at noon, and at bedtime., Disp: 90 capsule, Rfl: 0 .  diltiazem (CARDIZEM CD) 360 MG 24 hr capsule, TAKE (1) CAPSULE BY MOUTH ONCE DAILY., Disp: 90 capsule, Rfl: 1 .  Doxylamine Succinate, Sleep, (UNISOM PO), Take by mouth at bedtime as needed., Disp: , Rfl:  .  ELIQUIS 5 MG TABS tablet, TAKE 1 TABLET BY MOUTH TWICE DAILY, Disp: 180 tablet, Rfl: 1 .  famotidine (PEPCID) 20 MG tablet, Take 20 mg by mouth daily., Disp: , Rfl:  .  furosemide (LASIX) 20 MG tablet, Take 1 tablet (20 mg total) by mouth daily., Disp: 90 tablet, Rfl: 2 .  spironolactone (ALDACTONE) 25 MG tablet, TAKE 1 TABLET BY MOUTH ONCE DAILY., Disp: 90 tablet, Rfl: 0  Social History   Tobacco  Use  Smoking Status Former Smoker  . Packs/day: 1.00  . Years: 18.00  . Pack years: 18.00  . Types: Cigarettes  . Quit date: 08/17/1987  . Years since quitting: 33.1  Smokeless Tobacco Never Used  Tobacco Comment   quit smoking 10yrs ago    Allergies  Allergen Reactions  . Augmentin [Amoxicillin-Pot Clavulanate] Diarrhea  . Levaquin [Levofloxacin In D5w]     Severe join pain, muscle pain, and tendonitis  . Polysporin [Bacitracin-Polymyxin B]     Blisters   . Pravastatin Other (See Comments)    Muscle pain and weakness  . Sulfa Antibiotics   . Sulfonamide Derivatives Hives    Childhood reaction  . Erythromycin Swelling and Rash   Objective:  There were no vitals filed for this visit. There is no height or weight on file to calculate BMI. Constitutional Well developed. Well nourished.  Vascular  Dorsalis pedis pulses palpable bilaterally. Posterior tibial pulses palpable bilaterally. Capillary refill normal to all digits.  No cyanosis or clubbing noted. Pedal hair growth normal.  Neurologic Normal speech. Oriented to person, place, and time. Protective sensation absent  Dermatologic Wound Location: Right dorsal second digit ulceration with fat layer exposed.  No bone exposure noted.  No purulent drainage noted.  No malodor present.  Mild erythema noted. Wound Base: Mixed Granular/Fibrotic Peri-wound: Reddened Exudate: Scant/small amount Serous exudate Wound Measurements: -See below  Orthopedic: No pain to palpation either foot.   Radiographs: None.  If there is no improvement we will plan on getting x-rays in the near future Assessment:   1. Hammer toe of right foot   2. Ulcer of right second toe with fat layer exposed ([place])    Plan:  Patient was evaluated and treated and all questions answered.  Ulcer right second digit ulceration -Debridement as below. -Dressed with Betadine wet-to-dry, DSD. -Continue off-loading with surgical shoe.  Procedure: Excisional Debridement of Wound Tool: Sharp chisel blade/tissue nipper Rationale: Removal of non-viable soft tissue from the wound to promote healing.  Anesthesia: none Pre-Debridement Wound Measurements: 0.4 cm x 0.3 cm x 0.2 cm  Post-Debridement Wound Measurements: 0.5 cm x 0.4 cm x 0.2 cm  Type of Debridement: Sharp Excisional Tissue Removed: Non-viable soft tissue Blood loss: Minimal (<50cc) Depth of Debridement: subcutaneous tissue. Technique: Sharp excisional debridement to bleeding, viable wound base.  Wound Progress: This is my initial evaluation.  I will continue monitor the progression of the wound. Site healing conversation 7 Dressing: Dry, sterile, compression dressing. Disposition: Patient tolerated procedure well. Patient to return in 1 week for follow-up.  No follow-ups on file.

## 2020-09-23 NOTE — Patient Instructions (Signed)
    Medications changes include :   Take the lasix daily for few days and then as needed  Your prescription(s) have been submitted to your pharmacy. Please take as directed and contact our office if you believe you are having problem(s) with the medication(s).   A referral was ordered for Dr Donnetta Hutching.      Someone from their office will call you to schedule an appointment.

## 2020-09-24 ENCOUNTER — Telehealth: Payer: Self-pay | Admitting: *Deleted

## 2020-09-24 NOTE — Telephone Encounter (Signed)
-----   Message from Lelon Perla, MD sent at 09/24/2020  7:37 AM EST ----- Mildly elevated HR; change bisoprolol to 10 mg in AM and 5 mg in PM [employer]

## 2020-09-24 NOTE — Telephone Encounter (Signed)
Unable to reach pt or leave a message  

## 2020-09-26 MED ORDER — BISOPROLOL FUMARATE 10 MG PO TABS: ORAL_TABLET | ORAL | 3 refills | Status: DC

## 2020-09-26 NOTE — Telephone Encounter (Signed)
Spoke with pt, Aware of dr crenshaw's recommendations. New script sent to the pharmacy  

## 2020-10-01 ENCOUNTER — Ambulatory Visit: Payer: Medicare PPO | Admitting: Podiatry

## 2020-10-01 ENCOUNTER — Other Ambulatory Visit: Payer: Self-pay

## 2020-10-01 DIAGNOSIS — L97511 Non-pressure chronic ulcer of other part of right foot limited to breakdown of skin: Secondary | ICD-10-CM

## 2020-10-01 DIAGNOSIS — N952 Postmenopausal atrophic vaginitis: Secondary | ICD-10-CM | POA: Diagnosis not present

## 2020-10-01 DIAGNOSIS — N8110 Cystocele, unspecified: Secondary | ICD-10-CM | POA: Diagnosis not present

## 2020-10-01 DIAGNOSIS — Z01419 Encounter for gynecological examination (general) (routine) without abnormal findings: Secondary | ICD-10-CM | POA: Diagnosis not present

## 2020-10-02 ENCOUNTER — Other Ambulatory Visit: Payer: Self-pay | Admitting: Cardiology

## 2020-10-02 ENCOUNTER — Encounter: Payer: Self-pay | Admitting: Podiatry

## 2020-10-02 NOTE — Progress Notes (Signed)
Subjective:  Patient ID: [name] [name], female    DOB: 02-06-1941,  MRN: [id number]  Chief Complaint  Patient presents with  . Diabetic Ulcer    Follow up right second toe. Pt. States no pain or discomfort states improvement. Pt also wants Dr. Posey Pronto to look at bottom of right foot. There is a small sore that is tender to touch.     80 y.o. female presents for wound care.  Patient presents with a right second digit ulceration limited to the breakdown of the skin.  Patient states she is doing a lot better is looking a lot better that she has been doing Betadine wet-to-dry dressing changes.  She denies any other acute complaints..   Review of Systems: Negative except as noted in the HPI. Denies N/V/F/Ch.  Past Medical History:  Diagnosis Date  . Allergic rhinitis    takes Benadryl at bedtime  . Allergy   . Anemia   . Angio-edema   . Arthritis    osteoarthritis  . Atrial fibrillation ([place]) 06-10-2011   takes Diltiazem and Pradaxa daily  . Cancer ([employer])    skin  . Cataract    bilateral cataracts removed  . Clotting disorder ([place])    on PRADAXA - chronic a fib  . Collagenous colitis    recurrent, takes Budesonide daily as needed   . Colon polyps 01/14/2010   Hyperplastic  . Cough    states every day of her life and every chest xray is always clear  . GERD (gastroesophageal reflux disease)    takes Omeprazole daily  . History of poliomyelitis    Polio  age 39- no significant neuromuscular deficit  . Hyperlipidemia    was on Pravastatin but joint pain;has been off for 2-51months ;takes CoQ10  . Hypertension    takes Bisoprolol and Chlorthalidone daily  . Potassium deficiency    takes KDUR daily    Current Outpatient Medications:  .  bisoprolol (ZEBETA) 10 MG tablet, Take 1 tablet in the morning and 1/2 tablet in the evening, Disp: 135 tablet, Rfl: 3 .  budesonide (ENTOCORT EC) 3 MG 24 hr capsule, Take 1 capsule (3 mg total) by mouth in the morning, at noon, and at bedtime.,  Disp: 90 capsule, Rfl: 0 .  diltiazem (CARDIZEM CD) 360 MG 24 hr capsule, TAKE (1) CAPSULE BY MOUTH ONCE DAILY., Disp: 90 capsule, Rfl: 1 .  doxycycline (VIBRA-TABS) 100 MG tablet, Take 1 tablet (100 mg total) by mouth 2 (two) times daily., Disp: 20 tablet, Rfl: 0 .  Doxylamine Succinate, Sleep, (UNISOM PO), Take by mouth at bedtime as needed., Disp: , Rfl:  .  ELIQUIS 5 MG TABS tablet, TAKE 1 TABLET BY MOUTH TWICE DAILY, Disp: 180 tablet, Rfl: 1 .  famotidine (PEPCID) 20 MG tablet, Take 20 mg by mouth daily., Disp: , Rfl:  .  furosemide (LASIX) 20 MG tablet, Take 1 tablet (20 mg total) by mouth daily., Disp: 90 tablet, Rfl: 2 .  spironolactone (ALDACTONE) 25 MG tablet, TAKE 1 TABLET BY MOUTH ONCE DAILY., Disp: 90 tablet, Rfl: 0  Social History   Tobacco Use  Smoking Status Former Smoker  . Packs/day: 1.00  . Years: 18.00  . Pack years: 18.00  . Types: Cigarettes  . Quit date: 08/17/1987  . Years since quitting: 33.1  Smokeless Tobacco Never Used  Tobacco Comment   quit smoking 75yrs ago    Allergies  Allergen Reactions  . Augmentin [Amoxicillin-Pot Clavulanate] Diarrhea  . Levaquin [Levofloxacin In  D5w]     Severe join pain, muscle pain, and tendonitis  . Polysporin [Bacitracin-Polymyxin B]     Blisters   . Pravastatin Other (See Comments)    Muscle pain and weakness  . Sulfa Antibiotics   . Sulfonamide Derivatives Hives    Childhood reaction  . Erythromycin Swelling and Rash   Objective:  There were no vitals filed for this visit. There is no height or weight on file to calculate BMI. Constitutional Well developed. Well nourished.  Vascular Dorsalis pedis pulses palpable bilaterally. Posterior tibial pulses palpable bilaterally. Capillary refill normal to all digits.  No cyanosis or clubbing noted. Pedal hair growth normal.  Neurologic Normal speech. Oriented to person, place, and time. Protective sensation absent  Dermatologic Wound Location: Right dorsal second  digit ulceration now limited to the breakdown of the skin.  No bone exposure noted.  No purulent drainage noted.  No malodor present.  Mild erythema noted. Wound Base: Mixed Granular/Fibrotic Peri-wound: Reddened Exudate: Scant/small amount Serous exudate Wound Measurements: -See below  Orthopedic: No pain to palpation either foot.   Radiographs: None.  If there is no improvement we will plan on getting x-rays in the near future Assessment:   1. Ulcer of right second toe, limited to breakdown of skin [facility])    Plan:  Patient was evaluated and treated and all questions answered.  Ulcer right second digit ulceration -Debridement as below. -Dressed with Betadine wet-to-dry, DSD. -Continue off-loading with surgical shoe.  Procedure: Excisional Debridement of Wound Tool: Sharp chisel blade/tissue nipper Rationale: Removal of non-viable soft tissue from the wound to promote healing.  Anesthesia: none Pre-Debridement Wound Measurements: 0.3 cm x 0.2 cm x 0.2 cm  Post-Debridement Wound Measurements: 0.4 cm x 0.3 cm x 0.2 cm  Type of Debridement: Sharp Excisional Tissue Removed: Non-viable soft tissue Blood loss: Minimal (<50cc) Depth of Debridement: subcutaneous tissue. Technique: Sharp excisional debridement to bleeding, viable wound base.  Wound Progress: The wound is regressing and decreasing. Dressing: Dry, sterile, compression dressing. Disposition: Patient tolerated procedure well. Patient to return in 1 week for follow-up.  No follow-ups on file.

## 2020-10-10 ENCOUNTER — Other Ambulatory Visit: Payer: Self-pay

## 2020-10-10 ENCOUNTER — Encounter: Payer: Self-pay | Admitting: Physical Therapy

## 2020-10-10 ENCOUNTER — Ambulatory Visit (INDEPENDENT_AMBULATORY_CARE_PROVIDER_SITE_OTHER): Payer: Medicare PPO

## 2020-10-10 ENCOUNTER — Ambulatory Visit: Payer: Medicare PPO | Attending: Gastroenterology | Admitting: Physical Therapy

## 2020-10-10 DIAGNOSIS — M6281 Muscle weakness (generalized): Secondary | ICD-10-CM | POA: Insufficient documentation

## 2020-10-10 DIAGNOSIS — R279 Unspecified lack of coordination: Secondary | ICD-10-CM | POA: Diagnosis not present

## 2020-10-10 DIAGNOSIS — B351 Tinea unguium: Secondary | ICD-10-CM

## 2020-10-10 NOTE — Progress Notes (Signed)
Patient presents today for the 1st laser treatment. Diagnosed with mycotic nail infection by Dr. Posey Pronto.   Toenail most affected left hallux, 2nd, 4th, 5th and right hallux, 3rd and 4th.  All other systems are negative.  Nails were filed thin. Laser therapy was administered to left hallux, 2nd, 4th, 5th and right hallux, 3rd and 4th  toenails and patient tolerated the treatment well. All safety precautions were in place.    Follow up in 4 weeks for laser # 2  Please take picture of nails at next visit to document visual progress

## 2020-10-11 NOTE — Therapy (Signed)
[facility] [address], [place], [place], [address] Phone: [phone]   Fax:  [phone]  Physical Therapy Evaluation  Patient Details  Name: [name] [name] MRN: [id number] Date of Birth: August 26, 1940 Referring Provider (PT): Mauri Pole, MD   Encounter Date: 10/10/2020   PT End of Session - 10/11/20 2032    Visit Number 1    Date for PT Re-Evaluation 12/05/20    Authorization Type [employer]    PT Start Time 1105    PT Stop Time 1145    PT Time Calculation (min) 40 min    Activity Tolerance Patient tolerated treatment well    Behavior During Therapy [facility] for tasks assessed/performed           Past Medical History:  Diagnosis Date  . Allergic rhinitis    takes Benadryl at bedtime  . Allergy   . Anemia   . Angio-edema   . Arthritis    osteoarthritis  . Atrial fibrillation ([place]) 06-10-2011   takes Diltiazem and Pradaxa daily  . Cancer ([place])    skin  . Cataract    bilateral cataracts removed  . Clotting disorder ([place])    on PRADAXA - chronic a fib  . Collagenous colitis    recurrent, takes Budesonide daily as needed   . Colon polyps 01/14/2010   Hyperplastic  . Cough    states every day of her life and every chest xray is always clear  . GERD (gastroesophageal reflux disease)    takes Omeprazole daily  . History of poliomyelitis    Polio  age 40- no significant neuromuscular deficit  . Hyperlipidemia    was on Pravastatin but joint pain;has been off for 2-80months ;takes CoQ10  . Hypertension    takes Bisoprolol and Chlorthalidone daily  . Potassium deficiency    takes KDUR daily    Past Surgical History:  Procedure Laterality Date  . ADENOIDECTOMY    . APPENDECTOMY     at age 20  . BREAST BIOPSY  1947 & 10/07   Benign  . CARDIOVERSION N/A 11/05/2016   Procedure: CARDIOVERSION;  Surgeon: Sueanne Margarita, MD;  Location: [facility];  Service: Cardiovascular;  Laterality: N/A;  . cataracts  removed    . COLONOSCOPY    . ENDOVENOUS ABLATION SAPHENOUS VEIN W/ LASER  03-03-2011 left greater saphenous vein   . ENDOVENOUS ABLATION SAPHENOUS VEIN W/ LASER  01-06-2011  right greater saphenous vein  . epidural infection     x 2   . IR RADIOLOGIST EVAL & MGMT  03/07/2017  . SKIN CANCER EXCISION     removal of skin cancer on left leg  . TONSILLECTOMY     as a child  . TONSILLECTOMY    . TOTAL KNEE ARTHROPLASTY Right 09/18/2013   Procedure: TOTAL KNEE ARTHROPLASTY;  Surgeon: Garald Balding, MD;  Location: [facility];  Service: Orthopedics;  Laterality: Right;    There were no vitals filed for this visit.    Subjective Assessment - 10/11/20 2032    Subjective Pt has an autoimmune conditions.  Pt was on steroid after recent flare up.  Pt reports she has prolapsed bladder and with flare up of colitis she will be unable to leave the house due explosive diarrhea.  Pt feels like she has some food sensativities.  Last Sunday I had a hard time with nothing moving and then when it did I had leakage after that.  From time to time  that happens and it is hard to know what triggers it.    Pertinent History collagenous colitis, chronic diarrhea    Patient Stated Goals not sure    Currently in Pain? No/denies              [facility] PT Assessment - 10/11/20 0001      Assessment   Medical Diagnosis K52.9 (ICD-10-CM) - Chronic diarrhea; K52.831 (ICD-10-CM) - Collagenous colitis    Referring Provider (PT) Mauri Pole, MD    Prior Therapy No      Precautions   Precautions None      Restrictions   Weight Bearing Restrictions No      Balance Screen   Has the patient fallen in the past 6 months Yes    How many times? 1- was carrying something and misjudged a step    Has the patient had a decrease in activity level because of a fear of falling?  No    Is the patient reluctant to leave their home because of a fear of falling?  No      Home Ec[place] residence     Living Arrangements Alone      Prior Function   Level of Independence Independent    Vocation Part time employment;Volunteer work    Biomedical scientist sitting      Cognition   Overall Cognitive Status Within Functional Limits for tasks assessed      Posture/Postural Control   Posture/Postural Control Postural limitations    Postural Limitations Rounded Shoulders;Increased thoracic kyphosis;Flexed trunk      ROM / Strength   AROM / PROM / Strength AROM;PROM;Strength      AROM   Overall AROM Comments lumbar flexion 75%      PROM   Overall PROM Comments hip mobility 75%      Strength   Overall Strength Comments hip 4+/5 bilateral; core weakness demonstrated with low muscle activity when attempting to bear down      Flexibility   Soft Tissue Assessment /Muscle Length yes    Hamstrings 80%      Palpation   Palpation comment tight lumbar      Ambulation/Gait   Gait Pattern Decreased stride length;Trunk flexed                      Objective measurements completed on examination: See above findings.     Pelvic Floor Special Questions - 10/11/20 0001    Prior Pregnancies Yes    Number of Pregnancies 2    Number of Vaginal Deliveries 2    Urinary Leakage No    Urinary urgency No    Urinary frequency No    Fecal incontinence Yes    Fluid intake 1 coffee, 1 OJ, 24oz of water,    Falling out feeling (prolapse) Yes    Activities that cause feeling of prolapse voiding - can have dysuria    Pelvic Floor Internal Exam Pt identity confirmed and consent given to perform    Exam Type Rectal    Palpation TTP throuhgout and weak external sphincter; decreased coordination of core and core weakness causing weak push to evacuate    Strength weak squeeze, no lift    Strength # of reps 1   5 quick flicks with glutes co-contract   Strength # of seconds 4            [facility] Adult PT Treatment/Exercise - 10/11/20 0001  Self-Care   Other Self-Care Comments   posture educated and performed, initial HEP                  PT Education - 10/11/20 2044    Education Details Access Code: [email or web address]    Person(s) Educated Patient    Methods Explanation;Demonstration;Verbal cues;Handout;Tactile cues    Comprehension Verbalized understanding;Returned demonstration            PT Short Term Goals - 10/10/20 1130      PT SHORT TERM GOAL #1   Title ind with toileting techniques    Time 4    Period Weeks    Status New    Target Date 11/07/20             PT Long Term Goals - 10/10/20 1126      PT LONG TERM GOAL #1   Title Pt will be ind with HEP to help control BM and bladder    Time 8    Period Weeks    Status New    Target Date 12/05/20      PT LONG TERM GOAL #2   Title Pt will have 3 or less BMs per day    Time 8    Period Weeks    Status New    Target Date 12/05/20      PT LONG TERM GOAL #3   Title Pt will rate ugency of BM 3/10 or less    Baseline 5/10    Time 8    Period Weeks    Status New    Target Date 12/05/20                  Plan - 10/11/20 2033    Clinical Impression Statement Pt is 80 y/o female who presents to clinic with decreased coordination of pelvic floor and chronic diarrhea due to collagenous colitis flare ups.  Pt has pelvic floor weakness of 2/5 and holding contraction for up to 4 seconds for one rep.  Pt has weak push when attempting to evacute from pelvic floor due to decreased coordination of core and core strength deficits.  Pt has hip weakness at noted above.  Posture with increased kyphosis and weakness of periscapular muscles due to post-polio syndrome.  Pt will benefit from skilled PT to address all above mentioned impairments for restrored function and reduced leakage.    Personal Factors and Comorbidities Comorbidity 3+    Comorbidities Collagenous colitis, post-polio, appendectomy    Examination-Activity Limitations Toileting;Continence    Stability/Clinical Decision Making  Evolving/Moderate complexity    Clinical Decision Making Moderate    Rehab Potential Excellent    PT Frequency 1x / week    PT Duration 8 weeks    PT Treatment/Interventions ADLs/Self Care Home Management;Biofeedback;Cryotherapy;Electrical Stimulation;Moist Heat;Therapeutic activities;Therapeutic exercise;Neuromuscular re-education;Patient/family education;Manual techniques;Taping;Passive range of motion;Dry needling    PT Next Visit Plan f/u on posture, toileting, core strength, breathing and bulging    PT Home Exercise Plan Access Code: [email or web address]    Consulted and Agree with Plan of Care Patient           Patient will benefit from skilled therapeutic intervention in order to improve the following deficits and impairments:  Postural dysfunction,Impaired flexibility,Decreased strength,Decreased coordination  Visit Diagnosis: Muscle weakness (generalized)  Unspecified lack of coordination     Problem List Patient Active Problem List   Diagnosis Date Noted  . Bladder prolapse, female, acquired 11/12/2019  . Chronic sinusitis 12/06/2018  . Hyperglycemia 01/03/2017  .  DDD (degenerative disc disease), lumbar 01/03/2017  . Bilateral leg edema 04/13/2016  . Osteoarthritis of right knee 09/21/2013  . S/P total knee replacement using cement 09/18/2013  . Anisocoria 07/26/2013  . Varicose veins of bilateral lower extremities with other complications 45/80/9983  . Paroxysmal Atrial Fibrillation 06/29/2011  . COUGH, CHRONIC 03/09/2010  . HIP PAIN, BILATERAL 07/17/2009  . ALLERGIC RHINITIS 04/23/2008  . GERD 11/01/2007  . Essential hypertension 09/20/2007  . SKIN CANCER, LEG 07/03/2007  . Hyperlipidemia 07/03/2007  . Colitis, collagenous 07/03/2007    Jule Ser, PT 10/11/2020, 8:51 PM  [facility] [address], [place], [place], [address] Phone: [phone]   Fax:  [phone]  Name: Cyriah Childrey [place] MRN:  [id number] Date of Birth: Sep 01, 1940

## 2020-10-11 NOTE — Patient Instructions (Signed)
Access Code: [email or web address] URL: [email or web address] Date: 10/11/2020 Prepared by: Jari Favre  Exercises Seated Scapular Retraction - 3 x daily - 7 x weekly - 1 sets - 10 reps - 5 sec hold Seated Correct Posture - 1 x daily - 7 x weekly - 3 sets - 10 reps

## 2020-10-13 DIAGNOSIS — Z961 Presence of intraocular lens: Secondary | ICD-10-CM | POA: Diagnosis not present

## 2020-10-13 DIAGNOSIS — H524 Presbyopia: Secondary | ICD-10-CM | POA: Diagnosis not present

## 2020-10-15 ENCOUNTER — Encounter: Payer: Self-pay | Admitting: Gastroenterology

## 2020-10-15 ENCOUNTER — Other Ambulatory Visit: Payer: Self-pay

## 2020-10-15 ENCOUNTER — Ambulatory Visit: Payer: Medicare PPO | Admitting: Gastroenterology

## 2020-10-15 VITALS — BP 124/72 | HR 110 | Ht 65.0 in | Wt 165.0 lb

## 2020-10-15 DIAGNOSIS — K580 Irritable bowel syndrome with diarrhea: Secondary | ICD-10-CM

## 2020-10-15 DIAGNOSIS — R159 Full incontinence of feces: Secondary | ICD-10-CM | POA: Diagnosis not present

## 2020-10-15 MED ORDER — DICYCLOMINE HCL 10 MG PO CAPS: 10.0000 mg | ORAL_CAPSULE | Freq: Two times a day (BID) | ORAL | 1 refills | Status: DC | PRN

## 2020-10-15 NOTE — Patient Instructions (Signed)
We have sent Dicyclomine to your pharmacy  Take Benefiber 1 tablespoon 2-3 times a day   Continue Pelvic Floor Physical Therapy  Follow up in 3 months  Due to recent changes in healthcare laws, you may see the results of your imaging and laboratory studies on MyChart before your provider has had a chance to review them.  We understand that in some cases there may be results that are confusing or concerning to you. Not all laboratory results come back in the same time frame and the provider may be waiting for multiple results in order to interpret others.  Please give [place] 48 hours in order for your provider to thoroughly review all the results before contacting the office for clarification of your results.   I appreciate the  opportunity to care for you  Thank You   Harl Bowie , MD

## 2020-10-15 NOTE — Progress Notes (Signed)
[name] [name]    [id number]    Oct 03, 1940  Primary Care Physician:Burns, Claudina Lick, MD  Referring Physician: Binnie Rail, MD [place],  [place] [id number]   Chief complaint: Microscopic colitis  HPI: 80 year old very pleasant female with history of A. fib on Eliquis, family history of colon cancer here for follow-up for collagenous/microscopic colitis   She stopped budesonide after 6 weeks because she felt it was giving her moon face, was also making her more lethargic  She had an episode of fecal incontinence with fecal urgency last weekend  Denies any abdominal pain, blood in stool or rectal bleeding. GI stool pathogen panel is negative for any GI pathogen or C. Difficile  No recent antibiotics, travel history, or change in medications  Colonoscopy October 24, 2018: 11 mm polyp removed from cecum, sigmoid diverticulosis and internal hemorrhoids EGD September 09, 2016: Pedunculated gastric polyp removed with hot snare, gastric biopsies negative for H. pylori otherwise unremarkable exam Colonoscopy September 04, 2015 2 sessile polyps in cecum removed with hot snare   Outpatient Encounter Medications as of 10/15/2020  Medication Sig  . bisoprolol (ZEBETA) 10 MG tablet Take 1 tablet in the morning and 1/2 tablet in the evening  . diltiazem (CARDIZEM CD) 360 MG 24 hr capsule TAKE (1) CAPSULE BY MOUTH ONCE DAILY.  Marland Kitchen Doxylamine Succinate, Sleep, (UNISOM PO) Take by mouth at bedtime as needed.  Marland Kitchen ELIQUIS 5 MG TABS tablet TAKE 1 TABLET BY MOUTH TWICE DAILY  . famotidine (PEPCID) 20 MG tablet Take 20 mg by mouth 2 (two) times daily.  . furosemide (LASIX) 20 MG tablet Take 1 tablet (20 mg total) by mouth daily.  Marland Kitchen spironolactone (ALDACTONE) 25 MG tablet TAKE 1 TABLET BY MOUTH ONCE DAILY.  . budesonide (ENTOCORT EC) 3 MG 24 hr capsule Take 1 capsule (3 mg total) by mouth in the morning, at noon, and at bedtime. (Patient not taking: Reported on 10/15/2020)  .  [DISCONTINUED] doxycycline (VIBRA-TABS) 100 MG tablet Take 1 tablet (100 mg total) by mouth 2 (two) times daily.   No facility-administered encounter medications on file as of 10/15/2020.    Allergies as of 10/15/2020 - Review Complete 10/15/2020  Allergen Reaction Noted  . Augmentin [amoxicillin-pot clavulanate] Diarrhea 07/14/2015  . Levaquin [levofloxacin in d5w]  10/08/2014  . Polysporin [bacitracin-polymyxin b]  05/29/2015  . Pravastatin Other (See Comments) 11/24/2015  . Sulfa antibiotics  01/18/2020  . Sulfonamide derivatives Hives   . Erythromycin Swelling and Rash     Past Medical History:  Diagnosis Date  . Allergic rhinitis    takes Benadryl at bedtime  . Allergy   . Anemia   . Angio-edema   . Arthritis    osteoarthritis  . Atrial fibrillation ([place]) 06-10-2011   takes Diltiazem and Pradaxa daily  . Cancer ([place])    skin  . Cataract    bilateral cataracts removed  . Clotting disorder ([place])    on PRADAXA - chronic a fib  . Collagenous colitis    recurrent, takes Budesonide daily as needed   . Colon polyps 01/14/2010   Hyperplastic  . Cough    states every day of her life and every chest xray is always clear  . GERD (gastroesophageal reflux disease)    takes Omeprazole daily  . History of poliomyelitis    Polio  age 34- no significant neuromuscular deficit  . Hyperlipidemia    was on Pravastatin  but joint pain;has been off for 2-64months ;takes CoQ10  . Hypertension    takes Bisoprolol and Chlorthalidone daily  . Potassium deficiency    takes KDUR daily    Past Surgical History:  Procedure Laterality Date  . ADENOIDECTOMY    . APPENDECTOMY     at age 30  . BREAST BIOPSY  1947 & 10/07   Benign  . CARDIOVERSION N/A 11/05/2016   Procedure: CARDIOVERSION;  Surgeon: Sueanne Margarita, MD;  [facility];  Service: Cardiovascular;  Laterality: N/A;  . cataracts removed    . COLONOSCOPY    . ENDOVENOUS ABLATION SAPHENOUS VEIN W/ LASER  03-03-2011 left  greater saphenous vein   . ENDOVENOUS ABLATION SAPHENOUS VEIN W/ LASER  01-06-2011  right greater saphenous vein  . epidural infection     x 2   . IR RADIOLOGIST EVAL & MGMT  03/07/2017  . SKIN CANCER EXCISION     removal of skin cancer on left leg  . TONSILLECTOMY     as a child  . TONSILLECTOMY    . TOTAL KNEE ARTHROPLASTY Right 09/18/2013   Procedure: TOTAL KNEE ARTHROPLASTY;  Surgeon: Garald Balding, MD;  Location: [facility];  Service: Orthopedics;  Laterality: Right;    Family History  Problem Relation Age of Onset  . Stomach cancer Maternal Grandfather   . Heart disease Paternal Grandfather   . Colon cancer Son   . Rectal cancer Son 71  . Colon cancer Paternal Grandmother   . Alcohol abuse Father   . Heart disease Father   . Stroke Father   . Hypertension Father   . Hypertension Sister   . Other Sister        varicose veins  . Peripheral vascular disease Mother   . Other Mother        varicose veins  . Esophageal cancer Neg Hx   . Pancreatic cancer Neg Hx     Social History   Socioeconomic History  . Marital status: Married    Spouse name: Not on file  . Number of children: 2  . Years of education: Not on file  . Highest education level: Not on file  Occupational History  . Occupation: retired  Tobacco Use  . Smoking status: Former Smoker    Packs/day: 1.00    Years: 18.00    Pack years: 18.00    Types: Cigarettes    Quit date: 08/17/1987    Years since quitting: 33.1  . Smokeless tobacco: Never Used  . Tobacco comment: quit smoking 62yrs ago  Vaping Use  . Vaping Use: Never used  Substance and Sexual Activity  . Alcohol use: Yes    Alcohol/week: 7.0 standard drinks    Types: 7 Glasses of wine per week    Comment: glass of wine daily  . Drug use: No  . Sexual activity: Not Currently    Partners: Male    Birth control/protection: Surgical  Other Topics Concern  . Not on file  Social History Narrative   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    Married     Former Smoker quit 1989    Alcohol use-yes -  glasses of wine/day      son works for [employer] - she flies to [place] to visit her grandchildren   Social Determinants of Radio broadcast assistant Strain: Not on [employer] Insecurity: Not on file  Transportation Needs: Not on file  Physical Activity: Not  on file  Stress: Not on file  Social Connections: Not on file  Intimate Partner Violence: Not on file      Review of systems: All other review of systems negative except as mentioned in the HPI.   Physical Exam: Vitals:   10/15/20 1015  BP: 124/72  Pulse: (!) 110   Body mass index is 27.46 kg/m. Gen:      No acute distress Neuro: alert and oriented x 3 Psych: normal mood and affect  Data Reviewed:  Reviewed labs, radiology imaging, old records and pertinent past GI work up   Assessment and Plan/Recommendations:  80 year old very pleasant female with history of A. fib on Eliquis, family history of colon cancer, personal history of multiple adenomatous colon polyps and collagenous colitis  Chronic diarrhea: Collagenous colitis and IBS diarrhea  Diarrhea improved after budesonide course  She continues to have episodes of fecal urgency and incontinence  Continue pelvic floor physical therapy   Abdominal cramping and discomfort: Use dicyclomine 10 mg twice daily as needed   Return in 3 months or sooner if needed    The patient was provided an opportunity to ask questions and all were answered. The patient agreed with the plan and demonstrated an understanding of the instructions.  Damaris Hippo , MD    CC: Binnie Rail, MD

## 2020-10-21 ENCOUNTER — Other Ambulatory Visit: Payer: Self-pay

## 2020-10-21 ENCOUNTER — Encounter: Payer: Self-pay | Admitting: Physical Therapy

## 2020-10-21 ENCOUNTER — Ambulatory Visit: Payer: Medicare PPO | Attending: Gastroenterology | Admitting: Physical Therapy

## 2020-10-21 DIAGNOSIS — R279 Unspecified lack of coordination: Secondary | ICD-10-CM | POA: Insufficient documentation

## 2020-10-21 DIAGNOSIS — M6281 Muscle weakness (generalized): Secondary | ICD-10-CM | POA: Diagnosis not present

## 2020-10-21 NOTE — Patient Instructions (Addendum)
Toileting Techniques for Bowel Movements (Defecation) Using your belly (abdomen) and pelvic floor muscles to have a bowel movement is usually instinctive.  Sometimes people can have problems with these muscles and have to relearn proper defecation (emptying) techniques.  If you have weakness in your muscles, organs that are falling out, decreased sensation in your pelvis, or ignore your urge to go, you may find yourself straining to have a bowel movement.  You are straining if you are: . holding your breath or taking in a huge gulp of air and holding it  . keeping your lips and jaw tensed and closed tightly . turning red in the face because of excessive pushing or forcing . developing or worsening your  hemorrhoids . getting faint while pushing . not emptying completely and have to defecate many times a day  If you are straining, you are actually making it harder for yourself to have a bowel movement.  Many people find they are pulling up with the pelvic floor muscles and closing off instead of opening the anus. Due to lack pelvic floor relaxation and coordination the abdominal muscles, one has to work harder to push the feces out.  Many people have never been taught how to defecate efficiently and effectively.  Notice what happens to your body when you are having a bowel movement.  While you are sitting on the toilet pay attention to the following areas: . Jaw and mouth position . Angle of your hips   . Whether your feet touch the ground or not . Arm placement  . Spine position . Waist . Belly tension . Anus (opening of the anal canal)  An Evacuation/Defecation Plan   Here are the 4 basic points:  1. Lean forward enough for your elbows to rest on your knees 2. Support your feet on the floor or use a low stool if your feet don't touch the floor  3. Push out your belly as if you have swallowed a beach ball--you should feel a widening of your waist 4. Open and relax your pelvic floor  muscles, rather than tightening around the anus       The following conditions my require modifications to your toileting posture:  . If you have had surgery in the past that limits your back, hip, pelvic, knee or ankle flexibility . Constipation   Your healthcare practitioner may make the following additional suggestions and adjustments:  1) Sit on the toilet  a) Make sure your feet are supported. b) Notice your hip angle and spine position--most people find it effective to lean forward or raise their knees, which can help the muscles around the anus to relax  c) When you lean forward, place your forearms on your thighs for support  2) Relax suggestions a) Breath deeply in through your nose and out slowly through your mouth as if you are smelling the flowers and blowing out the candles. b) To become aware of how to relax your muscles, contracting and releasing muscles can be helpful.  Pull your pelvic floor muscles in tightly by using the image of holding back gas, or closing around the anus (visualize making a circle smaller) and lifting the anus up and in.  Then release the muscles and your anus should drop down and feel open. Repeat 5 times ending with the feeling of relaxation. c) Keep your pelvic floor muscles relaxed; let your belly bulge out. d) The digestive tract starts at the mouth and ends at the anal opening, so  be sure to relax both ends of the tube.  Place your tongue on the roof of your mouth with your teeth separated.  This helps relax your mouth and will help to relax the anus at the same time.  3) Empty (defecation) a) Keep your pelvic floor and sphincter relaxed, then bulge your anal muscles.  Make the anal opening wide.  b) Stick your belly out as if you have swallowed a beach ball. c) Make your belly wall hard using your belly muscles while continuing to breathe. Doing this makes it easier to open your anus. [id number]) Breath out and give a grunt (or try using other  sounds such as ahhhh, shhhhh, ohhhh or grrrrrrr).  4) Finish a) As you finish your bowel movement, pull the pelvic floor muscles up and in.  This will leave your anus in the proper place rather than remaining pushed out and down. If you leave your anus pushed out and down, it will start to feel as though that is normal and give you incorrect signals about needing to have a bowel movement.  [id number]

## 2020-10-21 NOTE — Therapy (Signed)
[facility] [address], [place], [place], [address] Phone: [phone]   Fax:  [phone]  Physical Therapy Treatment  Patient Details  Name: [name] [name] MRN: [id number] Date of Birth: [birth year] Referring Provider (PT): Mauri Pole, MD   Encounter Date: 10/21/2020   PT End of Session - 10/21/20 1458    Visit Number 2    Date for PT Re-Evaluation 12/05/20    Authorization Type [employer]    PT Start Time 1446    PT Stop Time 1524    PT Time Calculation (min) 38 min    Activity Tolerance Patient tolerated treatment well    Behavior During Therapy [facility] for tasks assessed/performed           Past Medical History:  Diagnosis Date  . Allergic rhinitis    takes Benadryl at bedtime  . Allergy   . Anemia   . Angio-edema   . Arthritis    osteoarthritis  . Atrial fibrillation ([place]) 06-10-2011   takes Diltiazem and Pradaxa daily  . Cancer ([place])    skin  . Cataract    bilateral cataracts removed  . Clotting disorder ([place])    on PRADAXA - chronic a fib  . Collagenous colitis    recurrent, takes Budesonide daily as needed   . Colon polyps 01/14/2010   Hyperplastic  . Cough    states every day of her life and every chest xray is always clear  . GERD (gastroesophageal reflux disease)    takes Omeprazole daily  . History of poliomyelitis    Polio  age 80- no significant neuromuscular deficit  . Hyperlipidemia    was on Pravastatin but joint pain;has been off for 2-27months ;takes CoQ10  . Hypertension    takes Bisoprolol and Chlorthalidone daily  . Potassium deficiency    takes KDUR daily    Past Surgical History:  Procedure Laterality Date  . ADENOIDECTOMY    . APPENDECTOMY     at age 80  . BREAST BIOPSY  1947 & 10/07   Benign  . CARDIOVERSION N/A 11/05/2016   Procedure: CARDIOVERSION;  Surgeon: Sueanne Margarita, MD;  [facility];  Service: Cardiovascular;  Laterality: N/A;  . cataracts  removed    . COLONOSCOPY    . ENDOVENOUS ABLATION SAPHENOUS VEIN W/ LASER  03-03-2011 left greater saphenous vein   . ENDOVENOUS ABLATION SAPHENOUS VEIN W/ LASER  01-06-2011  right greater saphenous vein  . epidural infection     x 2   . IR RADIOLOGIST EVAL & MGMT  03/07/2017  . SKIN CANCER EXCISION     removal of skin cancer on left leg  . TONSILLECTOMY     as a child  . TONSILLECTOMY    . TOTAL KNEE ARTHROPLASTY Right 09/18/2013   Procedure: TOTAL KNEE ARTHROPLASTY;  Surgeon: Garald Balding, MD;  Location: [facility];  Service: Orthopedics;  Laterality: Right;    There were no vitals filed for this visit.   Subjective Assessment - 10/21/20 1451    Subjective Pt states she has been thinking about her posture more and did she scap squeezes.  Pt was not able to get the sitting posture quite right at the computer chair. I think the benefiber helps and overall it has been better.    Currently in Pain? No/denies  [facility] Adult PT Treatment/Exercise - 10/21/20 0001      Self-Care   Other Self-Care Comments  toileting techniques educated and breathing demo      Neuro Re-ed    Neuro Re-ed Details  exhale with TrA activation; UE overhead with ball and ball squeeze with core and pelvic floor engaged      Manual Therapy   Manual Therapy Myofascial release    Myofascial Release abdomen Rt>Lt                  PT Education - 10/21/20 1527    Education Details [email or web address]    Person(s) Educated Patient    Methods Explanation;Demonstration;Tactile cues;Verbal cues    Comprehension Verbalized understanding;Returned demonstration            PT Short Term Goals - 10/10/20 1130      PT SHORT TERM GOAL #1   Title ind with toileting techniques    Time 4    Period Weeks    Status New    Target Date 11/07/20             PT Long Term Goals - 10/10/20 1126      PT LONG TERM GOAL #1   Title Pt will be ind with HEP to help control BM and  bladder    Time 8    Period Weeks    Status New    Target Date 12/05/20      PT LONG TERM GOAL #2   Title Pt will have 3 or less BMs per day    Time 8    Period Weeks    Status New    Target Date 12/05/20      PT LONG TERM GOAL #3   Title Pt will rate ugency of BM 3/10 or less    Baseline 5/10    Time 8    Period Weeks    Status New    Target Date 12/05/20                 Plan - 10/21/20 1530    Clinical Impression Statement Pt was given toileting tecniques and basic core strengthening today.  Pt has a hard time exhaling with core contraction. Pt has a lot of tension on Rt abdomen more than Lt side.  pt will benefit from skilled PT to continue working on core strength and extension stretch for improved posture and core stability.    PT Treatment/Interventions ADLs/Self Care Home Management;Biofeedback;Cryotherapy;Electrical Stimulation;Moist Heat;Therapeutic activities;Therapeutic exercise;Neuromuscular re-education;Patient/family education;Manual techniques;Taping;Passive range of motion;Dry needling    PT Next Visit Plan f/u on posture, toileting, core strength, breathing and bulging    PT Home Exercise Plan Access Code: [email or web address]    Consulted and Agree with Plan of Care Patient           Patient will benefit from skilled therapeutic intervention in order to improve the following deficits and impairments:  Postural dysfunction,Impaired flexibility,Decreased strength,Decreased coordination  Visit Diagnosis: Muscle weakness (generalized)  Unspecified lack of coordination     Problem List Patient Active Problem List   Diagnosis Date Noted  . Bladder prolapse, female, acquired 11/12/2019  . Chronic sinusitis 12/06/2018  . Hyperglycemia 01/03/2017  . DDD (degenerative disc disease), lumbar 01/03/2017  . Bilateral leg edema 04/13/2016  . Osteoarthritis of right knee 09/21/2013  . S/P total knee replacement using cement 09/18/2013  . Anisocoria 07/26/2013  .  Varicose veins of bilateral lower extremities with other complications 63/08/6008  . Paroxysmal Atrial  Fibrillation 06/29/2011  . COUGH, CHRONIC 03/09/2010  . HIP PAIN, BILATERAL 07/17/2009  . ALLERGIC RHINITIS 04/23/2008  . GERD 11/01/2007  . Essential hypertension 09/20/2007  . SKIN CANCER, LEG 07/03/2007  . Hyperlipidemia 07/03/2007  . Colitis, collagenous 07/03/2007    Jule Ser, PT 10/21/2020, 3:33 PM  [facility] [address], [place], [place], [address] Phone: [phone]   Fax:  [phone]  Name: Salah Burlison [place] MRN: [id number] Date of Birth: [birth year]

## 2020-10-22 ENCOUNTER — Ambulatory Visit: Payer: Medicare PPO | Admitting: Cardiology

## 2020-10-22 ENCOUNTER — Encounter: Payer: Self-pay | Admitting: Cardiology

## 2020-10-22 ENCOUNTER — Encounter: Payer: Self-pay | Admitting: Podiatry

## 2020-10-22 ENCOUNTER — Ambulatory Visit: Payer: Medicare PPO | Admitting: Podiatry

## 2020-10-22 ENCOUNTER — Other Ambulatory Visit: Payer: Self-pay

## 2020-10-22 VITALS — BP 132/68 | HR 72 | Ht 65.0 in | Wt 161.0 lb

## 2020-10-22 DIAGNOSIS — I10 Essential (primary) hypertension: Secondary | ICD-10-CM

## 2020-10-22 DIAGNOSIS — L97511 Non-pressure chronic ulcer of other part of right foot limited to breakdown of skin: Secondary | ICD-10-CM

## 2020-10-22 NOTE — Progress Notes (Signed)
Cardiology Office Note:    Date:  10/22/2020   ID:  [name] [name], DOB [birth year], MRN [id number]  PCP:  Binnie Rail, MD  Cardiologist:  [name], MD  Electrophysiologist:  None   Referring MD: Binnie Rail, MD   No chief complaint on file.   History of Present Illness:    [name] [name] is a 80 y.o. female with a hx of chronic atrial fibrillation.  Previous echocardiogram June 2020 showed normal LV function.  She has severe left atrial enlargement.  She saw Dr. Stanford Breed in 09/05/2020.  She was having fatigue then.  Echocardiogram and outpatient monitor were obtained.  Echocardiogram was unchanged, her LV function is normal.  Outpatient monitor did show increased rates and her bisoprolol was increased.  She is seen today in follow-up.  In retrospect she feels that her symptoms were secondary to budesonide which she was taking for colitis.  When she stopped taking this she felt much improved.  Past Medical History:  Diagnosis Date  . Allergic rhinitis    takes Benadryl at bedtime  . Allergy   . Anemia   . Angio-edema   . Arthritis    osteoarthritis  . Atrial fibrillation ([place]) 06-10-2011   takes Diltiazem and Pradaxa daily  . Cancer ([place])    skin  . Cataract    bilateral cataracts removed  . Clotting disorder ([place])    on PRADAXA - chronic a fib  . Collagenous colitis    recurrent, takes Budesonide daily as needed   . Colon polyps 01/14/2010   Hyperplastic  . Cough    states every day of her life and every chest xray is always clear  . GERD (gastroesophageal reflux disease)    takes Omeprazole daily  . History of poliomyelitis    Polio  age 79- no significant neuromuscular deficit  . Hyperlipidemia    was on Pravastatin but joint pain;has been off for 2-36months ;takes CoQ10  . Hypertension    takes Bisoprolol and Chlorthalidone daily  . Potassium deficiency    takes KDUR daily    Past Surgical History:  Procedure Laterality Date  . ADENOIDECTOMY    .  APPENDECTOMY     at age 89  . BREAST BIOPSY  1947 & 10/07   Benign  . CARDIOVERSION N/A 11/05/2016   Procedure: CARDIOVERSION;  Surgeon: Sueanne Margarita, MD;  [facility];  Service: Cardiovascular;  Laterality: N/A;  . cataracts removed    . COLONOSCOPY    . ENDOVENOUS ABLATION SAPHENOUS VEIN W/ LASER  03-03-2011 left greater saphenous vein   . ENDOVENOUS ABLATION SAPHENOUS VEIN W/ LASER  01-06-2011  right greater saphenous vein  . epidural infection     x 2   . IR RADIOLOGIST EVAL & MGMT  03/07/2017  . SKIN CANCER EXCISION     removal of skin cancer on left leg  . TONSILLECTOMY     as a child  . TONSILLECTOMY    . TOTAL KNEE ARTHROPLASTY Right 09/18/2013   Procedure: TOTAL KNEE ARTHROPLASTY;  Surgeon: Garald Balding, MD;  Location: [facility];  Service: Orthopedics;  Laterality: Right;    Current Medications: Current Meds  Medication Sig  . bisoprolol (ZEBETA) 10 MG tablet Take 1 tablet in the morning and 1/2 tablet in the evening  . dicyclomine (BENTYL) 10 MG capsule Take 1 capsule (10 mg total) by mouth 2 (two) times daily as needed for spasms.  Marland Kitchen diltiazem (CARDIZEM CD) 360 MG 24  hr capsule TAKE (1) CAPSULE BY MOUTH ONCE DAILY.  Marland Kitchen Doxylamine Succinate, Sleep, (UNISOM PO) Take by mouth at bedtime as needed.  Marland Kitchen ELIQUIS 5 MG TABS tablet TAKE 1 TABLET BY MOUTH TWICE DAILY  . famotidine (PEPCID) 20 MG tablet Take 20 mg by mouth 2 (two) times daily.  . furosemide (LASIX) 20 MG tablet Take 1 tablet (20 mg total) by mouth daily.  Marland Kitchen spironolactone (ALDACTONE) 25 MG tablet TAKE 1 TABLET BY MOUTH ONCE DAILY.  . [DISCONTINUED] budesonide (ENTOCORT EC) 3 MG 24 hr capsule Take 1 capsule (3 mg total) by mouth in the morning, at noon, and at bedtime.     Allergies:   Augmentin [amoxicillin-pot clavulanate], Levaquin [levofloxacin in d5w], Polysporin [bacitracin-polymyxin b], Pravastatin, Sulfa antibiotics, Sulfonamide derivatives, and Erythromycin   Social History   Socioeconomic  History  . Marital status: Married    Spouse name: Not on file  . Number of children: 2  . Years of education: Not on file  . Highest education level: Not on file  Occupational History  . Occupation: retired  Tobacco Use  . Smoking status: Former Smoker    Packs/day: 1.00    Years: 18.00    Pack years: 18.00    Types: Cigarettes    Quit date: 08/17/1987    Years since quitting: 33.2  . Smokeless tobacco: Never Used  . Tobacco comment: quit smoking 31yrs ago  Vaping Use  . Vaping Use: Never used  Substance and Sexual Activity  . Alcohol use: Yes    Alcohol/week: 7.0 standard drinks    Types: 7 Glasses of wine per week    Comment: glass of wine daily  . Drug use: No  . Sexual activity: Not Currently    Partners: Male    Birth control/protection: Surgical  Other Topics Concern  . Not on file  Social History Narrative   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   Married     Former Smoker quit 1989    Alcohol use-yes -  glasses of wine/day      son works for [employer] - she flies to [place] to visit her grandchildren   Social Determinants of Radio broadcast assistant Strain: Not on [employer] Insecurity: Not on file  Transportation Needs: Not on file  Physical Activity: Not on file  Stress: Not on file  Social Connections: Not on file     Family History: The patient's family history includes Alcohol abuse in her father; Colon cancer in her paternal grandmother and son; Heart disease in her father and paternal grandfather; Hypertension in her father and sister; Other in her mother and sister; Peripheral vascular disease in her mother; Rectal cancer (age of onset: 52) in her son; Stomach cancer in her maternal grandfather; Stroke in her father. There is no history of Esophageal cancer or Pancreatic cancer.  ROS:   Please see the history of present illness.     All other systems reviewed and are negative.  EKGs/Labs/Other Studies Reviewed:    The following studies were  reviewed today: Echo 09/15/2020- IMPRESSIONS    1. Left ventricular ejection fraction, by estimation, is 60 to 65%. The  left ventricle has normal function. The left ventricle has no regional  wall motion abnormalities. There is mild asymmetric left ventricular  hypertrophy of the basal and septal  segments. Left ventricular diastolic parameters are indeterminate.  2. Right ventricular systolic function is normal. The right ventricular  size is normal.  3. Left atrial size was moderately dilated.  4. The mitral valve is normal in structure. Mild mitral valve  regurgitation. No evidence of mitral stenosis.  5. Tricuspid valve regurgitation is mild to moderate.  6. The aortic valve is normal in structure. Aortic valve regurgitation is  mild. Mild to moderate aortic valve sclerosis/calcification is present,  without any evidence of aortic stenosis.  7. The inferior vena cava is normal in size with greater than 50%  respiratory variability, suggesting right atrial pressure of 3 mmHg.    Recent Labs: 11/12/2019: ALT 16 09/05/2020: BUN 25; Creatinine, Ser 0.88; Hemoglobin 12.0; Platelets 323; Potassium 5.2; Sodium 138; TSH 2.130  Recent Lipid Panel    Component Value Date/Time   CHOL 224 (H) 11/12/2019 1353   TRIG 93.0 11/12/2019 1353   HDL 64.60 11/12/2019 1353   CHOLHDL 3 11/12/2019 1353   VLDL 18.6 11/12/2019 1353   LDLCALC 141 (H) 11/12/2019 1353   LDLDIRECT 128.6 12/09/2010 1141    Physical Exam:    VS:  BP 132/68   Pulse 72   Ht 5\' 5"  (1.651 m)   Wt 161 lb (73 kg)   SpO2 97%   BMI 26.79 kg/m     Wt Readings from Last 3 Encounters:  10/22/20 161 lb (73 kg)  10/15/20 165 lb (74.8 kg)  09/23/20 167 lb (75.8 kg)     GEN: Well nourished, well developed in no acute distress HEENT: Normal NECK: No JVD; No carotid bruits CARDIAC: irregularly irregular, no murmurs, rubs, gallops RESPIRATORY:  Clear to auscultation without rales, wheezing or rhonchi  ABDOMEN:  Soft, non-tender, non-distended MUSCULOSKELETAL:  No edema; No deformity  SKIN: Warm and dry NEUROLOGIC:  Alert and oriented x 3 PSYCHIATRIC:  Normal affect   ASSESSMENT:    DOE- Improved after budesonide (collitis) stopped  CAF- Bisoprolol increased- rate controlled.  She does have some chronic LE edema which I suspect is secondary to Diltiazem but I would not change this dose now.   Anticoagulated- Full dose Eliquis.  CHADS VASC=4   PLAN:    Plan: No change in Rx- check BMP- f/u as scheduled.    Medication Adjustments/Labs and Tests Ordered: Current medicines are reviewed at length with the patient today.  Concerns regarding medicines are outlined above.  Orders Placed This Encounter  Procedures  . Basic metabolic panel   No orders of the defined types were placed in this encounter.   Patient Instructions  Medication Instructions:  No Changes *If you need a refill on your cardiac medications before your next appointment, please call your pharmacy*   Lab Work: [facility] If you have labs (blood work) drawn today and your tests are completely normal, you will receive your results only by: Marland Kitchen MyChart Message (if you have MyChart) OR . A paper copy in the mail If you have any lab test that is abnormal or we need to change your treatment, we will call you to review the results.   Testing/Procedures:    Follow-Up: At [facility], you and your health needs are our priority.  As part of our continuing mission to provide you with exceptional heart care, we have created designated Provider Care Teams.  These Care Teams include your primary Cardiologist (physician) and Advanced Practice Providers (APPs -  Physician Assistants and Nurse Practitioners) who all work together to provide you with the care you need, when you need it.    Your next appointment:   Jan 02, 2021 1:20 PM  The format  for your next appointment:   In Person  Provider:   [name], MD        Signed, Kerin Ransom, PA-C  10/22/2020 2:37 PM    [place]

## 2020-10-22 NOTE — Progress Notes (Signed)
Subjective:  Patient ID: [name] [name], female    DOB: 16-Nov-1940,  MRN: [id number]  Chief Complaint  Patient presents with  . Foot Ulcer    80 y.o. female presents for wound care.  Patient presents with a right second digit ulceration limited to the breakdown of the skin.  Patient states she is doing a lot better is looking a lot better she has stopped doing Betadine wet-to-dry dressing changes few days ago.  She denies any other acute complaints   Review of Systems: Negative except as noted in the HPI. Denies N/V/F/Ch.  Past Medical History:  Diagnosis Date  . Allergic rhinitis    takes Benadryl at bedtime  . Allergy   . Anemia   . Angio-edema   . Arthritis    osteoarthritis  . Atrial fibrillation ([place]) 06-10-2011   takes Diltiazem and Pradaxa daily  . Cancer ([place])    skin  . Cataract    bilateral cataracts removed  . Clotting disorder ([place])    on PRADAXA - chronic a fib  . Collagenous colitis    recurrent, takes Budesonide daily as needed   . Colon polyps 01/14/2010   Hyperplastic  . Cough    states every day of her life and every chest xray is always clear  . GERD (gastroesophageal reflux disease)    takes Omeprazole daily  . History of poliomyelitis    Polio  age 46- no significant neuromuscular deficit  . Hyperlipidemia    was on Pravastatin but joint pain;has been off for 2-20months ;takes CoQ10  . Hypertension    takes Bisoprolol and Chlorthalidone daily  . Potassium deficiency    takes KDUR daily    Current Outpatient Medications:  .  bisoprolol (ZEBETA) 10 MG tablet, Take 1 tablet in the morning and 1/2 tablet in the evening, Disp: 135 tablet, Rfl: 3 .  budesonide (ENTOCORT EC) 3 MG 24 hr capsule, Take 1 capsule (3 mg total) by mouth in the morning, at noon, and at bedtime. (Patient not taking: Reported on 10/15/2020), Disp: 90 capsule, Rfl: 0 .  dicyclomine (BENTYL) 10 MG capsule, Take 1 capsule (10 mg total) by mouth 2 (two) times daily as needed for  spasms., Disp: 90 capsule, Rfl: 1 .  diltiazem (CARDIZEM CD) 360 MG 24 hr capsule, TAKE (1) CAPSULE BY MOUTH ONCE DAILY., Disp: 90 capsule, Rfl: 1 .  Doxylamine Succinate, Sleep, (UNISOM PO), Take by mouth at bedtime as needed., Disp: , Rfl:  .  ELIQUIS 5 MG TABS tablet, TAKE 1 TABLET BY MOUTH TWICE DAILY, Disp: 180 tablet, Rfl: 1 .  famotidine (PEPCID) 20 MG tablet, Take 20 mg by mouth 2 (two) times daily., Disp: , Rfl:  .  furosemide (LASIX) 20 MG tablet, Take 1 tablet (20 mg total) by mouth daily., Disp: 90 tablet, Rfl: 2 .  spironolactone (ALDACTONE) 25 MG tablet, TAKE 1 TABLET BY MOUTH ONCE DAILY., Disp: 90 tablet, Rfl: 0  Social History   Tobacco Use  Smoking Status Former Smoker  . Packs/day: 1.00  . Years: 18.00  . Pack years: 18.00  . Types: Cigarettes  . Quit date: 08/17/1987  . Years since quitting: 33.2  Smokeless Tobacco Never Used  Tobacco Comment   quit smoking 63yrs ago    Allergies  Allergen Reactions  . Augmentin [Amoxicillin-Pot Clavulanate] Diarrhea  . Levaquin [Levofloxacin In D5w]     Severe join pain, muscle pain, and tendonitis  . Polysporin [Bacitracin-Polymyxin B]     Blisters   .  Pravastatin Other (See Comments)    Muscle pain and weakness  . Sulfa Antibiotics   . Sulfonamide Derivatives Hives    Childhood reaction  . Erythromycin Swelling and Rash   Objective:  There were no vitals filed for this visit. There is no height or weight on file to calculate BMI. Constitutional Well developed. Well nourished.  Vascular Dorsalis pedis pulses palpable bilaterally. Posterior tibial pulses palpable bilaterally. Capillary refill normal to all digits.  No cyanosis or clubbing noted. Pedal hair growth normal.  Neurologic Normal speech. Oriented to person, place, and time. Protective sensation absent  Dermatologic  right dorsal second digit ulceration completely reepithelialized.  No clinical signs of infection.  No purulent drainage noted.  No exposure  of deeper structures structure noted.  Orthopedic: No pain to palpation either foot.   Radiographs: None.  If there is no improvement we will plan on getting x-rays in the near future Assessment:   1. Ulcer of right second toe, limited to breakdown of skin [facility])    Plan:  Patient was evaluated and treated and all questions answered.  Ulcer right second digit ulceration -Clinically healed without any complications.  Wound has completely reepithelialized.  I discussed with her shoe gear modification and ways to offload the ulceration to take the pressure off of the dorsal aspect of the digit.  Patient states understanding and will do so. -I will see her back as needed.  Patient is officially discharged from her care No follow-ups on file.

## 2020-10-22 NOTE — Patient Instructions (Signed)
Medication Instructions:  No Changes *If you need a refill on your cardiac medications before your next appointment, please call your pharmacy*   Lab Work: [facility] If you have labs (blood work) drawn today and your tests are completely normal, you will receive your results only by: Marland Kitchen MyChart Message (if you have MyChart) OR . A paper copy in the mail If you have any lab test that is abnormal or we need to change your treatment, we will call you to review the results.   Testing/Procedures:    Follow-Up: At [facility], you and your health needs are our priority.  As part of our continuing mission to provide you with exceptional heart care, we have created designated Provider Care Teams.  These Care Teams include your primary Cardiologist (physician) and Advanced Practice Providers (APPs -  Physician Assistants and Nurse Practitioners) who all work together to provide you with the care you need, when you need it.    Your next appointment:   Jan 02, 2021 1:20 PM  The format for your next appointment:   In Person  Provider:   [name], MD

## 2020-10-23 LAB — BASIC METABOLIC PANEL
BUN/Creatinine Ratio: 18 (ref 12–28)
BUN: 18 mg/dL (ref 8–27)
CO2: 21 mmol/L (ref 20–29)
Calcium: 9.6 mg/dL (ref 8.7–10.3)
Chloride: 98 mmol/L (ref 96–106)
Creatinine, Ser: 0.99 mg/dL (ref 0.57–1.00)
Glucose: 116 mg/dL — ABNORMAL HIGH (ref 65–99)
Potassium: 4.8 mmol/L (ref 3.5–5.2)
Sodium: 137 mmol/L (ref 134–144)
eGFR: 58 mL/min/{1.73_m2} — ABNORMAL LOW (ref >59)

## 2020-10-28 ENCOUNTER — Encounter: Payer: Self-pay | Admitting: *Deleted

## 2020-11-03 ENCOUNTER — Other Ambulatory Visit: Payer: Self-pay

## 2020-11-03 ENCOUNTER — Ambulatory Visit (INDEPENDENT_AMBULATORY_CARE_PROVIDER_SITE_OTHER): Payer: Medicare PPO

## 2020-11-03 DIAGNOSIS — B351 Tinea unguium: Secondary | ICD-10-CM

## 2020-11-03 NOTE — Patient Instructions (Signed)

## 2020-11-03 NOTE — Progress Notes (Signed)
Patient presents today for the 2nd laser treatment. Diagnosed with mycotic nail infection by Dr. Posey Pronto.   Toenail most affected left hallux, 2nd, 4th, 5th and right hallux, 3rd and 4th.  All other systems are negative.  Nails were filed thin. Laser therapy was administered to left hallux, 2nd, 4th, 5th and right hallux, 3rd and 4th  toenails and patient tolerated the treatment well. All safety precautions were in place.    Follow up in 4 weeks for laser # 3.

## 2020-11-10 ENCOUNTER — Other Ambulatory Visit: Payer: Self-pay | Admitting: Cardiology

## 2020-11-10 ENCOUNTER — Ambulatory Visit: Payer: Medicare PPO | Admitting: Physical Therapy

## 2020-11-10 DIAGNOSIS — I480 Paroxysmal atrial fibrillation: Secondary | ICD-10-CM

## 2020-11-13 ENCOUNTER — Other Ambulatory Visit: Payer: Self-pay

## 2020-11-13 ENCOUNTER — Ambulatory Visit: Payer: Medicare PPO | Admitting: Physical Therapy

## 2020-11-13 ENCOUNTER — Encounter: Payer: Self-pay | Admitting: Physical Therapy

## 2020-11-13 DIAGNOSIS — M6281 Muscle weakness (generalized): Secondary | ICD-10-CM | POA: Diagnosis not present

## 2020-11-13 DIAGNOSIS — R279 Unspecified lack of coordination: Secondary | ICD-10-CM | POA: Diagnosis not present

## 2020-11-13 NOTE — Therapy (Signed)
[facility] [address] [place], [place], [address] Phone: [phone]   Fax:  [phone]  Physical Therapy Treatment  Patient Details  Name: [name] [name] MRN: [id number] Date of Birth: 1940-11-03 Referring Provider (PT): Mauri Pole, MD   Encounter Date: 11/13/2020   PT End of Session - 11/13/20 1015    Visit Number 3    Date for PT Re-Evaluation 12/05/20    Authorization Type [employer]    PT Start Time 1016    PT Stop Time 1055    PT Time Calculation (min) 39 min    Activity Tolerance Patient tolerated treatment well    Behavior During Therapy [facility] for tasks assessed/performed           Past Medical History:  Diagnosis Date  . Allergic rhinitis    takes Benadryl at bedtime  . Allergy   . Anemia   . Angio-edema   . Arthritis    osteoarthritis  . Atrial fibrillation ([place]) 06-10-2011   takes Diltiazem and Pradaxa daily  . Cancer ([place])    skin  . Cataract    bilateral cataracts removed  . Clotting disorder ([place])    on PRADAXA - chronic a fib  . Collagenous colitis    recurrent, takes Budesonide daily as needed   . Colon polyps 01/14/2010   Hyperplastic  . Cough    states every day of her life and every chest xray is always clear  . GERD (gastroesophageal reflux disease)    takes Omeprazole daily  . History of poliomyelitis    Polio  age 34- no significant neuromuscular deficit  . Hyperlipidemia    was on Pravastatin but joint pain;has been off for 2-51months ;takes CoQ10  . Hypertension    takes Bisoprolol and Chlorthalidone daily  . Potassium deficiency    takes KDUR daily    Past Surgical History:  Procedure Laterality Date  . ADENOIDECTOMY    . APPENDECTOMY     at age 36  . BREAST BIOPSY  1947 & 10/07   Benign  . CARDIOVERSION N/A 11/05/2016   Procedure: CARDIOVERSION;  Surgeon: Sueanne Margarita, MD;  [facility];  Service: Cardiovascular;  Laterality: N/A;  . cataracts  removed    . COLONOSCOPY    . ENDOVENOUS ABLATION SAPHENOUS VEIN W/ LASER  03-03-2011 left greater saphenous vein   . ENDOVENOUS ABLATION SAPHENOUS VEIN W/ LASER  01-06-2011  right greater saphenous vein  . epidural infection     x 2   . IR RADIOLOGIST EVAL & MGMT  03/07/2017  . SKIN CANCER EXCISION     removal of skin cancer on left leg  . TONSILLECTOMY     as a child  . TONSILLECTOMY    . TOTAL KNEE ARTHROPLASTY Right 09/18/2013   Procedure: TOTAL KNEE ARTHROPLASTY;  Surgeon: Garald Balding, MD;  Location: [facility];  Service: Orthopedics;  Laterality: Right;    There were no vitals filed for this visit.   Subjective Assessment - 11/13/20 1028    Subjective Pt states the benefiber is helping with bulk.    Pertinent History collagenous colitis, chronic diarrhea    Patient Stated Goals not sure    Currently in Pain? No/denies                             [facility] Adult PT Treatment/Exercise - 11/13/20 0001      Exercises  Exercises Lumbar      Lumbar Exercises: Standing   Row Strengthening;Both;20 reps;Theraband    Theraband Level (Row) Level 2 (Red)    Other Standing Lumbar Exercises pelvic tilt at wall - back to wall and shoulder ER      Lumbar Exercises: Seated   Long Arc Quad on Chair Strengthening;Both;10 reps   ball squeeze   Other Seated Lumbar Exercises ball squeeze 20x; clam sitting yellow loop 20x                  PT Education - 11/13/20 1058    Education Details Access Code: [email or web address]    Person(s) Educated Patient    Methods Explanation;Demonstration;Handout;Verbal cues;Tactile cues    Comprehension Verbalized understanding;Returned demonstration            PT Short Term Goals - 11/13/20 1026      PT SHORT TERM GOAL #1   Title ind with toileting techniques    Status Achieved             PT Long Term Goals - 11/13/20 1027      PT LONG TERM GOAL #1   Title Pt will be ind with HEP to help control BM and bladder    Status  On-going      PT LONG TERM GOAL #2   Title Pt will have 3 or less BMs per day    Baseline 2x/ day    Status Achieved      PT LONG TERM GOAL #3   Title Pt will rate ugency of BM 3/10 or less    Status On-going                 Plan - 11/13/20 1055    Clinical Impression Statement Pt did well with exercise progression. Pt did well with posture and is feeling better with a better chair at work. Pt needed cues to do exercises correctly. Skilled PT neeed to continue working on core strength.    PT Treatment/Interventions ADLs/Self Care Home Management;Biofeedback;Cryotherapy;Electrical Stimulation;Moist Heat;Therapeutic activities;Therapeutic exercise;Neuromuscular re-education;Patient/family education;Manual techniques;Taping;Passive range of motion;Dry needling    PT Next Visit Plan progress HEP for core and postural strength    PT Home Exercise Plan Access Code: [email or web address]    Consulted and Agree with Plan of Care Patient           Patient will benefit from skilled therapeutic intervention in order to improve the following deficits and impairments:  Postural dysfunction,Impaired flexibility,Decreased strength,Decreased coordination  Visit Diagnosis: Muscle weakness (generalized)  Unspecified lack of coordination     Problem List Patient Active Problem List   Diagnosis Date Noted  . Bladder prolapse, female, acquired 11/12/2019  . Chronic sinusitis 12/06/2018  . Hyperglycemia 01/03/2017  . DDD (degenerative disc disease), lumbar 01/03/2017  . Bilateral leg edema 04/13/2016  . Osteoarthritis of right knee 09/21/2013  . S/P total knee replacement using cement 09/18/2013  . Anisocoria 07/26/2013  . Varicose veins of bilateral lower extremities with other complications 97/67/3419  . Paroxysmal Atrial Fibrillation 06/29/2011  . COUGH, CHRONIC 03/09/2010  . HIP PAIN, BILATERAL 07/17/2009  . ALLERGIC RHINITIS 04/23/2008  . GERD 11/01/2007  . Essential hypertension  09/20/2007  . SKIN CANCER, LEG 07/03/2007  . Hyperlipidemia 07/03/2007  . Colitis, collagenous 07/03/2007    Jule Ser, PT 11/13/2020, 11:22 AM  [facility] [address] [place], [place], [address] Phone: [phone]   Fax:  [phone]  Name: [name] [name]  MRN: [id number] Date of Birth: 12/19/1940

## 2020-11-13 NOTE — Patient Instructions (Signed)
Access Code: [email or web address] URL: [email or web address] Date: 11/13/2020 Prepared by: Jari Favre  Exercises Seated Scapular Retraction - 3 x daily - 7 x weekly - 1 sets - 10 reps - 5 sec hold Seated Correct Posture - 1 x daily - 7 x weekly - 3 sets - 10 reps Supine Hip Adductor Squeeze with Small Ball - 3 x daily - 7 x weekly - 1 sets - 10 reps - 3 sec hold Supine Transversus Abdominis Bracing - Hands on Thighs - 1 x daily - 7 x weekly - 10 reps - 1 sets - 5 sec hold Seated Bilateral Shoulder External Rotation with Resistance - 1 x daily - 7 x weekly - 2 sets - 10 reps Seated Long Arc Quad with Hip Adduction - 1 x daily - 7 x weekly - 3 sets - 10 reps Standing Bilateral Low Shoulder Row with Anchored Resistance - 1 x daily - 7 x weekly - 3 sets - 10 reps  Patient Education Office Posture

## 2020-11-17 DIAGNOSIS — L821 Other seborrheic keratosis: Secondary | ICD-10-CM | POA: Diagnosis not present

## 2020-11-17 DIAGNOSIS — D692 Other nonthrombocytopenic purpura: Secondary | ICD-10-CM | POA: Diagnosis not present

## 2020-11-17 DIAGNOSIS — Z85828 Personal history of other malignant neoplasm of skin: Secondary | ICD-10-CM | POA: Diagnosis not present

## 2020-11-17 DIAGNOSIS — D1801 Hemangioma of skin and subcutaneous tissue: Secondary | ICD-10-CM | POA: Diagnosis not present

## 2020-11-17 DIAGNOSIS — B351 Tinea unguium: Secondary | ICD-10-CM | POA: Diagnosis not present

## 2020-11-17 DIAGNOSIS — D225 Melanocytic nevi of trunk: Secondary | ICD-10-CM | POA: Diagnosis not present

## 2020-11-17 DIAGNOSIS — L570 Actinic keratosis: Secondary | ICD-10-CM | POA: Diagnosis not present

## 2020-11-18 ENCOUNTER — Other Ambulatory Visit: Payer: Self-pay

## 2020-11-18 NOTE — Patient Instructions (Addendum)
Blood work was ordered.     Medications changes include :   Start zetia daily for your cholesterol.      Please followup in 1 year    Health Maintenance, Female Adopting a healthy lifestyle and getting preventive care are important in promoting health and wellness. Ask your health care provider about:  The right schedule for you to have regular tests and exams.  Things you can do on your own to prevent diseases and keep yourself healthy. What should I know about diet, weight, and exercise? Eat a healthy diet  Eat a diet that includes plenty of vegetables, fruits, low-fat dairy products, and lean protein.  Do not eat a lot of foods that are high in solid fats, added sugars, or sodium.   Maintain a healthy weight Body mass index (BMI) is used to identify weight problems. It estimates body fat based on height and weight. Your health care provider can help determine your BMI and help you achieve or maintain a healthy weight. Get regular exercise Get regular exercise. This is one of the most important things you can do for your health. Most adults should:  Exercise for at least 150 minutes each week. The exercise should increase your heart rate and make you sweat (moderate-intensity exercise).  Do strengthening exercises at least twice a week. This is in addition to the moderate-intensity exercise.  Spend less time sitting. Even light physical activity can be beneficial. Watch cholesterol and blood lipids Have your blood tested for lipids and cholesterol at 80 years of age, then have this test every 5 years. Have your cholesterol levels checked more often if:  Your lipid or cholesterol levels are high.  You are older than 80 years of age.  You are at high risk for heart disease. What should I know about cancer screening? Depending on your health history and family history, you may need to have cancer screening at various ages. This may include screening for:  Breast  cancer.  Cervical cancer.  Colorectal cancer.  Skin cancer.  Lung cancer. What should I know about heart disease, diabetes, and high blood pressure? Blood pressure and heart disease  High blood pressure causes heart disease and increases the risk of stroke. This is more likely to develop in people who have high blood pressure readings, are of African descent, or are overweight.  Have your blood pressure checked: ? Every 3-5 years if you are 63-28 years of age. ? Every year if you are 82 years old or older. Diabetes Have regular diabetes screenings. This checks your fasting blood sugar level. Have the screening done:  Once every three years after age 31 if you are at a normal weight and have a low risk for diabetes.  More often and at a younger age if you are overweight or have a high risk for diabetes. What should I know about preventing infection? Hepatitis B If you have a higher risk for hepatitis B, you should be screened for this virus. Talk with your health care provider to find out if you are at risk for hepatitis B infection. Hepatitis C Testing is recommended for:  Everyone born from [birth year] through 1965.  Anyone with known risk factors for hepatitis C. Sexually transmitted infections (STIs)  Get screened for STIs, including gonorrhea and chlamydia, if: ? You are sexually active and are younger than 80 years of age. ? You are older than 80 years of age and your health care provider tells you that you are  at risk for this type of infection. ? Your sexual activity has changed since you were last screened, and you are at increased risk for chlamydia or gonorrhea. Ask your health care provider if you are at risk.  Ask your health care provider about whether you are at high risk for HIV. Your health care provider may recommend a prescription medicine to help prevent HIV infection. If you choose to take medicine to prevent HIV, you should first get tested for HIV. You should  then be tested every 3 months for as long as you are taking the medicine. Pregnancy  If you are about to stop having your period (premenopausal) and you may become pregnant, seek counseling before you get pregnant.  Take 400 to 800 micrograms (mcg) of folic acid every day if you become pregnant.  Ask for birth control (contraception) if you want to prevent pregnancy. Osteoporosis and menopause Osteoporosis is a disease in which the bones lose minerals and strength with aging. This can result in bone fractures. If you are 84 years old or older, or if you are at risk for osteoporosis and fractures, ask your health care provider if you should:  Be screened for bone loss.  Take a calcium or vitamin D supplement to lower your risk of fractures.  Be given hormone replacement therapy (HRT) to treat symptoms of menopause. Follow these instructions at home: Lifestyle  Do not use any products that contain nicotine or tobacco, such as cigarettes, e-cigarettes, and chewing tobacco. If you need help quitting, ask your health care provider.  Do not use street drugs.  Do not share needles.  Ask your health care provider for help if you need support or information about quitting drugs. Alcohol use  Do not drink alcohol if: ? Your health care provider tells you not to drink. ? You are pregnant, may be pregnant, or are planning to become pregnant.  If you drink alcohol: ? Limit how much you use to 0-1 drink a day. ? Limit intake if you are breastfeeding.  Be aware of how much alcohol is in your drink. In the [place] one drink equals one 12 oz bottle of beer (355 mL), one 5 oz glass of wine (148 mL), or one 1 oz glass of hard liquor (44 mL). General instructions  Schedule regular health, dental, and eye exams.  Stay current with your vaccines.  Tell your health care provider if: ? You often feel depressed. ? You have ever been abused or do not feel safe at home. Summary  Adopting a  healthy lifestyle and getting preventive care are important in promoting health and wellness.  Follow your health care provider's instructions about healthy diet, exercising, and getting tested or screened for diseases.  Follow your health care provider's instructions on monitoring your cholesterol and blood pressure. This information is not intended to replace advice given to you by your health care provider. Make sure you discuss any questions you have with your health care provider. Document Revised: 07/26/2018 Document Reviewed: 07/26/2018 Elsevier Patient Education  2021 [employer].

## 2020-11-18 NOTE — Progress Notes (Signed)
Subjective:    Patient ID: [name] [name], female    DOB: 1940/12/20, 80 y.o.   MRN: [id number]   This visit occurred during the SARS-CoV-2 public health emergency.  Safety protocols were in place, including screening questions prior to the visit, additional usage of staff PPE, and extensive cleaning of exam room while observing appropriate contact time as indicated for disinfecting solutions.    HPI She is here for a physical exam.   She does feel much better compared to when she was here last which she was feeling was related to the steroid she was taking for colitis and when she stopped it she started to feel better.  Medications and allergies reviewed with patient and updated if appropriate.  Patient Active Problem List   Diagnosis Date Noted  . Aortic atherosclerosis ([place]) 11/19/2020  . Bladder prolapse, female, acquired 11/12/2019  . Chronic sinusitis 12/06/2018  . Hyperglycemia 01/03/2017  . DDD (degenerative disc disease), lumbar 01/03/2017  . Bilateral leg edema 04/13/2016  . Osteoarthritis of right knee 09/21/2013  . S/P total knee replacement using cement 09/18/2013  . Anisocoria 07/26/2013  . Varicose veins of bilateral lower extremities with other complications 02/54/2706  . Paroxysmal Atrial Fibrillation 06/29/2011  . COUGH, CHRONIC 03/09/2010  . HIP PAIN, BILATERAL 07/17/2009  . ALLERGIC RHINITIS 04/23/2008  . GERD 11/01/2007  . Essential hypertension 09/20/2007  . SKIN CANCER, LEG 07/03/2007  . Hyperlipidemia 07/03/2007  . Colitis, collagenous 07/03/2007    Current Outpatient Medications on File Prior to Visit  Medication Sig Dispense Refill  . bisoprolol (ZEBETA) 10 MG tablet Take 1 tablet in the morning and 1/2 tablet in the evening 135 tablet 3  . diltiazem (CARDIZEM CD) 360 MG 24 hr capsule TAKE (1) CAPSULE BY MOUTH ONCE DAILY. 90 capsule 0  . Doxylamine Succinate, Sleep, (UNISOM PO) Take by mouth at bedtime as needed.    Marland Kitchen ELIQUIS 5 MG TABS tablet  TAKE 1 TABLET BY MOUTH TWICE DAILY 180 tablet 1  . famotidine (PEPCID) 20 MG tablet Take 20 mg by mouth 2 (two) times daily.    . furosemide (LASIX) 20 MG tablet Take 1 tablet (20 mg total) by mouth daily. 90 tablet 2  . spironolactone (ALDACTONE) 25 MG tablet TAKE 1 TABLET BY MOUTH ONCE DAILY. 90 tablet 0  . dicyclomine (BENTYL) 10 MG capsule Take 1 capsule (10 mg total) by mouth 2 (two) times daily as needed for spasms. (Patient not taking: Reported on 11/19/2020) 90 capsule 1   No current facility-administered medications on file prior to visit.    Past Medical History:  Diagnosis Date  . Allergic rhinitis    takes Benadryl at bedtime  . Allergy   . Anemia   . Angio-edema   . Arthritis    osteoarthritis  . Atrial fibrillation ([place]) 06-10-2011   takes Diltiazem and Pradaxa daily  . Cancer ([place])    skin  . Cataract    bilateral cataracts removed  . Clotting disorder ([place])    on PRADAXA - chronic a fib  . Collagenous colitis    recurrent, takes Budesonide daily as needed   . Colon polyps 01/14/2010   Hyperplastic  . Cough    states every day of her life and every chest xray is always clear  . GERD (gastroesophageal reflux disease)    takes Omeprazole daily  . History of poliomyelitis    Polio  age 67- no significant neuromuscular deficit  . Hyperlipidemia    was  on Pravastatin but joint pain;has been off for 2-6months ;takes CoQ10  . Hypertension    takes Bisoprolol and Chlorthalidone daily  . Potassium deficiency    takes KDUR daily    Past Surgical History:  Procedure Laterality Date  . ADENOIDECTOMY    . APPENDECTOMY     at age 35  . BREAST BIOPSY  1947 & 10/07   Benign  . CARDIOVERSION N/A 11/05/2016   Procedure: CARDIOVERSION;  Surgeon: Sueanne Margarita, MD;  Location: [facility];  Service: Cardiovascular;  Laterality: N/A;  . cataracts removed    . COLONOSCOPY    . ENDOVENOUS ABLATION SAPHENOUS VEIN W/ LASER  03-03-2011 left greater saphenous vein   .  ENDOVENOUS ABLATION SAPHENOUS VEIN W/ LASER  01-06-2011  right greater saphenous vein  . epidural infection     x 2   . IR RADIOLOGIST EVAL & MGMT  03/07/2017  . SKIN CANCER EXCISION     removal of skin cancer on left leg  . TONSILLECTOMY     as a child  . TONSILLECTOMY    . TOTAL KNEE ARTHROPLASTY Right 09/18/2013   Procedure: TOTAL KNEE ARTHROPLASTY;  Surgeon: Garald Balding, MD;  Location: [facility];  Service: Orthopedics;  Laterality: Right;    Social History   Socioeconomic History  . Marital status: Married    Spouse name: Not on file  . Number of children: 2  . Years of education: Not on file  . Highest education level: Not on file  Occupational History  . Occupation: retired  Tobacco Use  . Smoking status: Former Smoker    Packs/day: 1.00    Years: 18.00    Pack years: 18.00    Types: Cigarettes    Quit date: 08/17/1987    Years since quitting: 33.2  . Smokeless tobacco: Never Used  . Tobacco comment: quit smoking 25yrs ago  Vaping Use  . Vaping Use: Never used  Substance and Sexual Activity  . Alcohol use: Yes    Alcohol/week: 7.0 standard drinks    Types: 7 Glasses of wine per week    Comment: glass of wine daily  . Drug use: No  . Sexual activity: Not Currently    Partners: Male    Birth control/protection: Surgical  Other Topics Concern  . Not on file  Social History Narrative   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   Married     Former Smoker quit 1989    Alcohol use-yes -  glasses of wine/day      son works for [employer] - she flies to [place] to visit her grandchildren   Social Determinants of Radio broadcast assistant Strain: Not on [employer] Insecurity: Not on file  Transportation Needs: Not on file  Physical Activity: Not on file  Stress: Not on file  Social Connections: Not on file    Family History  Problem Relation Age of Onset  . Stomach cancer Maternal Grandfather   . Heart disease Paternal Grandfather   . Colon cancer Son   .  Rectal cancer Son 81  . Colon cancer Paternal Grandmother   . Alcohol abuse Father   . Heart disease Father   . Stroke Father   . Hypertension Father   . Hypertension Sister   . Other Sister        varicose veins  . Peripheral vascular disease Mother   . Other Mother        varicose  veins  . Esophageal cancer Neg Hx   . Pancreatic cancer Neg Hx     Review of Systems  Constitutional: Negative for chills and fever.  Eyes: Negative for visual disturbance.  Respiratory: Negative for cough, shortness of breath and wheezing.   Cardiovascular: Positive for leg swelling (occ). Negative for chest pain and palpitations.  Gastrointestinal: Negative for abdominal pain, blood in stool, constipation, diarrhea and nausea.       Occ gerd  Genitourinary: Positive for difficulty urinating (bladder prolapse). Negative for dysuria.  Musculoskeletal: Positive for arthralgias (mild) and back pain.  Skin: Negative for rash.  Neurological: Negative for light-headedness and headaches.  Psychiatric/Behavioral: Negative for dysphoric mood. The patient is not nervous/anxious.        Objective:   Vitals:   11/19/20 1056  BP: 130/78  Pulse: 93  Temp: 97.9 F (36.6 C)  SpO2: 98%   Filed Weights   11/19/20 1056  Weight: 164 lb 9.6 oz (74.7 kg)   Body mass index is 27.39 kg/m.  BP Readings from Last 3 Encounters:  11/19/20 130/78  11/19/20 130/78  10/22/20 132/68    Wt Readings from Last 3 Encounters:  11/19/20 164 lb 10.9 oz (74.7 kg)  11/19/20 164 lb 9.6 oz (74.7 kg)  10/22/20 161 lb (73 kg)    Depression screen [facility] 2/9 11/19/2020 01/19/2019 01/04/2018 01/03/2017 10/08/2014  Decreased Interest 0 0 0 0 1  Down, Depressed, Hopeless 0 1 1 0 0  PHQ - 2 Score 0 1 1 0 1  Altered sleeping 0 - 0 - -  Tired, decreased energy 0 - 1 - -  Change in appetite 0 - 0 - -  Feeling bad or failure about yourself  0 - 0 - -  Trouble concentrating 1 - 0 - -  Moving slowly or fidgety/restless 0 - 0 - -   Suicidal thoughts 0 - 0 - -  PHQ-9 Score 1 - 2 - -  Difficult doing work/chores Not difficult at all - Not difficult at all - -  Some recent data might be hidden    GAD 7 : Generalized Anxiety Score 11/19/2020  Nervous, Anxious, on Edge 0  Control/stop worrying 0  Worry too much - different things 0  Trouble relaxing 0  Restless 0  Easily annoyed or irritable 1  Afraid - awful might happen 0  Total GAD 7 Score 1  Anxiety Difficulty Not difficult at all        Physical Exam Constitutional: She appears well-developed and well-nourished. No distress.  HENT:  Head: Normocephalic and atraumatic.  Right Ear: External ear normal. Normal ear canal and TM Left Ear: External ear normal.  Normal ear canal and TM Mouth/Throat: Oropharynx is clear and moist.  Eyes: Conjunctivae and EOM are normal.  Neck: Neck supple. No tracheal deviation present. No thyromegaly present.  No carotid bruit  Cardiovascular: Normal rate, regular rhythm and normal heart sounds.   No murmur heard.  No edema. Pulmonary/Chest: Effort normal and breath sounds normal. No respiratory distress. She has no wheezes. She has no rales.  Breast: deferred   Abdominal: Soft. She exhibits no distension. There is no tenderness.  Lymphadenopathy: She has no cervical adenopathy.  Skin: Skin is warm and dry. She is not diaphoretic.  Psychiatric: She has a normal mood and affect. Her behavior is normal.        Assessment & Plan:   Physical exam: Screening blood work    ordered Immunizations  Discussed tdap,  covid booster #2, shingrix Colonoscopy  Up to date  Mammogram  Up to date  - July  Dexa  Done by gyn- ? Up to date - will check Gyn - goes to physician for women - up to date Eye exams  Up to date  Exercise  Active, walking 30 min a day Weight  Ok for age Substance abuse  none   Screened for depression using the PHQ 9 scale.  No evidence of depression.   Screened for anxiety using GAD7 Scale.  No evidence  of anxiety.    See Problem List for Assessment and Plan of chronic medical problems.

## 2020-11-19 ENCOUNTER — Ambulatory Visit (INDEPENDENT_AMBULATORY_CARE_PROVIDER_SITE_OTHER): Payer: Medicare PPO

## 2020-11-19 ENCOUNTER — Ambulatory Visit (INDEPENDENT_AMBULATORY_CARE_PROVIDER_SITE_OTHER): Payer: Medicare PPO | Admitting: Internal Medicine

## 2020-11-19 ENCOUNTER — Other Ambulatory Visit: Payer: Self-pay

## 2020-11-19 ENCOUNTER — Encounter: Payer: Self-pay | Admitting: Internal Medicine

## 2020-11-19 VITALS — BP 130/78 | HR 93 | Temp 97.9°F | Ht 65.0 in | Wt 164.6 lb

## 2020-11-19 VITALS — BP 130/78 | HR 93 | Temp 97.9°F | Ht 65.0 in | Wt 164.7 lb

## 2020-11-19 DIAGNOSIS — K52831 Collagenous colitis: Secondary | ICD-10-CM

## 2020-11-19 DIAGNOSIS — Z1331 Encounter for screening for depression: Secondary | ICD-10-CM | POA: Diagnosis not present

## 2020-11-19 DIAGNOSIS — R739 Hyperglycemia, unspecified: Secondary | ICD-10-CM

## 2020-11-19 DIAGNOSIS — R600 Localized edema: Secondary | ICD-10-CM

## 2020-11-19 DIAGNOSIS — Z1389 Encounter for screening for other disorder: Secondary | ICD-10-CM | POA: Diagnosis not present

## 2020-11-19 DIAGNOSIS — I700 Atherosclerosis of aorta: Secondary | ICD-10-CM | POA: Diagnosis not present

## 2020-11-19 DIAGNOSIS — I4821 Permanent atrial fibrillation: Secondary | ICD-10-CM

## 2020-11-19 DIAGNOSIS — K219 Gastro-esophageal reflux disease without esophagitis: Secondary | ICD-10-CM

## 2020-11-19 DIAGNOSIS — Z0000 Encounter for general adult medical examination without abnormal findings: Secondary | ICD-10-CM

## 2020-11-19 DIAGNOSIS — E782 Mixed hyperlipidemia: Secondary | ICD-10-CM | POA: Diagnosis not present

## 2020-11-19 DIAGNOSIS — I10 Essential (primary) hypertension: Secondary | ICD-10-CM

## 2020-11-19 LAB — CBC WITH DIFFERENTIAL/PLATELET
Basophils Absolute: 0 10*3/uL (ref 0.0–0.1)
Basophils Relative: 0.5 % (ref 0.0–3.0)
Eosinophils Absolute: 0.1 10*3/uL (ref 0.0–0.7)
Eosinophils Relative: 0.9 % (ref 0.0–5.0)
HCT: 40.9 % (ref 36.0–46.0)
Hemoglobin: 13.9 g/dL (ref 12.0–15.0)
Lymphocytes Relative: 20.4 % (ref 12.0–46.0)
Lymphs Abs: 1.5 10*3/uL (ref 0.7–4.0)
MCHC: 34 g/dL (ref 30.0–36.0)
MCV: 102.2 fl — ABNORMAL HIGH (ref 78.0–100.0)
Monocytes Absolute: 0.9 10*3/uL (ref 0.1–1.0)
Monocytes Relative: 12.1 % — ABNORMAL HIGH (ref 3.0–12.0)
Neutro Abs: 5 10*3/uL (ref 1.4–7.7)
Neutrophils Relative %: 66.1 % (ref 43.0–77.0)
Platelets: 240 10*3/uL (ref 150.0–400.0)
RBC: 4 Mil/uL (ref 3.87–5.11)
RDW: 13.4 % (ref 11.5–15.5)
WBC: 7.5 10*3/uL (ref 4.0–10.5)

## 2020-11-19 LAB — COMPREHENSIVE METABOLIC PANEL
ALT: 14 U/L (ref 0–35)
AST: 18 U/L (ref 0–37)
Albumin: 4.4 g/dL (ref 3.5–5.2)
Alkaline Phosphatase: 76 U/L (ref 39–117)
BUN: 27 mg/dL — ABNORMAL HIGH (ref 6–23)
CO2: 31 mEq/L (ref 19–32)
Calcium: 9.9 mg/dL (ref 8.4–10.5)
Chloride: 99 mEq/L (ref 96–112)
Creatinine, Ser: 1.07 mg/dL (ref 0.40–1.20)
GFR: 49.21 mL/min — ABNORMAL LOW (ref >60.00)
Glucose, Bld: 95 mg/dL (ref 70–99)
Potassium: 4.7 mEq/L (ref 3.5–5.1)
Sodium: 137 mEq/L (ref 135–145)
Total Bilirubin: 0.5 mg/dL (ref 0.2–1.2)
Total Protein: 7.2 g/dL (ref 6.0–8.3)

## 2020-11-19 LAB — LIPID PANEL
Cholesterol: 203 mg/dL — ABNORMAL HIGH (ref 0–200)
HDL: 67.1 mg/dL (ref >39.00)
LDL Cholesterol: 117 mg/dL — ABNORMAL HIGH (ref 0–99)
NonHDL: 136.3
Total CHOL/HDL Ratio: 3
Triglycerides: 99 mg/dL (ref 0.0–149.0)
VLDL: 19.8 mg/dL (ref 0.0–40.0)

## 2020-11-19 LAB — TSH: TSH: 1.98 u[IU]/mL (ref 0.35–4.50)

## 2020-11-19 LAB — HEMOGLOBIN A1C: Hgb A1c MFr Bld: 5.3 % (ref 4.6–6.5)

## 2020-11-19 MED ORDER — EZETIMIBE 10 MG PO TABS: 10.0000 mg | ORAL_TABLET | Freq: Every day | ORAL | 3 refills | Status: DC

## 2020-11-19 NOTE — Assessment & Plan Note (Addendum)
Chronic Has tried multiple statins in the past and did not tolerate them due to leg pain Willing to try Zetia-start Zetia 10 mg daily We will check lipid panel, CMP here today-possibly get rechecked with cardiology or we can do in approximately 2 months Continue regular walking, healthy diet

## 2020-11-19 NOTE — Patient Instructions (Addendum)
[name] [name] , Thank you for taking time to come for your Medicare Wellness Visit. I appreciate your ongoing commitment to your health goals. Please review the following plan we discussed and let me know if I can assist you in the future.   Screening recommendations/referrals: Colonoscopy: 10/24/2018; due every 3 years Mammogram: 03/03/2020 Bone Density: never done Recommended yearly ophthalmology/optometry visit for glaucoma screening and checkup Recommended yearly dental visit for hygiene and checkup  Vaccinations: Influenza vaccine: 05/09/2020 Pneumococcal vaccine: 04/23/2008, 07/24/2013 Tdap vaccine: 09/19/2009; due every 10 years (overdue)  Shingles vaccine: never done; can check with local pharmacy Covid-19: 08/23/2019, 09/21/2019, 06/12/2020  Advanced directives: Documents on file  Conditions/risks identified: Yes; Reviewed health maintenance screenings with patient today and relevant education, vaccines, and/or referrals were provided. Please continue to do your personal lifestyle choices by: daily care of teeth and gums, regular physical activity (goal should be 5 days a week for 30 minutes), eat a healthy diet, avoid tobacco and drug use, limiting any alcohol intake, taking a low-dose aspirin (if not allergic or have been advised by your provider otherwise) and taking vitamins and minerals as recommended by your provider. Continue doing brain stimulating activities (puzzles, reading, adult coloring books, staying active) to keep memory sharp. Continue to eat heart healthy diet (full of fruits, vegetables, whole grains, lean protein, water--limit salt, fat, and sugar intake) and increase physical activity as tolerated.  Next appointment:  Please schedule your next Medicare Wellness Visit with your Nurse Health Advisor in 1 year by calling [phone].   Preventive Care 43 Years and Older, Female Preventive care refers to lifestyle choices and visits with your health care provider that can promote  health and wellness. What does preventive care include?  A yearly physical exam. This is also called an annual well check.  Dental exams once or twice a year.  Routine eye exams. Ask your health care provider how often you should have your eyes checked.  Personal lifestyle choices, including:  Daily care of your teeth and gums.  Regular physical activity.  Eating a healthy diet.  Avoiding tobacco and drug use.  Limiting alcohol use.  Practicing safe sex.  Taking low-dose aspirin every day.  Taking vitamin and mineral supplements as recommended by your health care provider. What happens during an annual well check? The services and screenings done by your health care provider during your annual well check will depend on your age, overall health, lifestyle risk factors, and family history of disease. Counseling  Your health care provider may ask you questions about your:  Alcohol use.  Tobacco use.  Drug use.  Emotional well-being.  Home and relationship well-being.  Sexual activity.  Eating habits.  History of falls.  Memory and ability to understand (cognition).  Work and work Statistician.  Reproductive health. Screening  You may have the following tests or measurements:  Height, weight, and BMI.  Blood pressure.  Lipid and cholesterol levels. These may be checked every 5 years, or more frequently if you are over 49 years old.  Skin check.  Lung cancer screening. You may have this screening every year starting at age 48 if you have a 30-pack-year history of smoking and currently smoke or have quit within the past 15 years.  Fecal occult blood test (FOBT) of the stool. You may have this test every year starting at age 73.  Flexible sigmoidoscopy or colonoscopy. You may have a sigmoidoscopy every 5 years or a colonoscopy every 10 years starting at age 53.  Hepatitis C blood test.  Hepatitis B blood test.  Sexually transmitted disease (STD)  testing.  Diabetes screening. This is done by checking your blood sugar (glucose) after you have not eaten for a while (fasting). You may have this done every 1-3 years.  Bone density scan. This is done to screen for osteoporosis. You may have this done starting at age 50.  Mammogram. This may be done every 1-2 years. Talk to your health care provider about how often you should have regular mammograms. Talk with your health care provider about your test results, treatment options, and if necessary, the need for more tests. Vaccines  Your health care provider may recommend certain vaccines, such as:  Influenza vaccine. This is recommended every year.  Tetanus, diphtheria, and acellular pertussis (Tdap, Td) vaccine. You may need a Td booster every 10 years.  Zoster vaccine. You may need this after age 53.  Pneumococcal 13-valent conjugate (PCV13) vaccine. One dose is recommended after age 67.  Pneumococcal polysaccharide (PPSV23) vaccine. One dose is recommended after age 36. Talk to your health care provider about which screenings and vaccines you need and how often you need them. This information is not intended to replace advice given to you by your health care provider. Make sure you discuss any questions you have with your health care provider. Document Released: 08/29/2015 Document Revised: 04/21/2016 Document Reviewed: 06/03/2015 Elsevier Interactive Patient Education  2017 [employer] Prevention in the Home Falls can cause injuries. They can happen to people of all ages. There are many things you can do to make your home safe and to help prevent falls. What can I do on the outside of my home?  Regularly fix the edges of walkways and driveways and fix any cracks.  Remove anything that might make you trip as you walk through a door, such as a raised step or threshold.  Trim any bushes or trees on the path to your home.  Use bright outdoor lighting.  Clear any walking  paths of anything that might make someone trip, such as rocks or tools.  Regularly check to see if handrails are loose or broken. Make sure that both sides of any steps have handrails.  Any raised decks and porches should have guardrails on the edges.  Have any leaves, snow, or ice cleared regularly.  Use sand or salt on walking paths during winter.  Clean up any spills in your garage right away. This includes oil or grease spills. What can I do in the bathroom?  Use night lights.  Install grab bars by the toilet and in the tub and shower. Do not use towel bars as grab bars.  Use non-skid mats or decals in the tub or shower.  If you need to sit down in the shower, use a plastic, non-slip stool.  Keep the floor dry. Clean up any water that spills on the floor as soon as it happens.  Remove soap buildup in the tub or shower regularly.  Attach bath mats securely with double-sided non-slip rug tape.  Do not have throw rugs and other things on the floor that can make you trip. What can I do in the bedroom?  Use night lights.  Make sure that you have a light by your bed that is easy to reach.  Do not use any sheets or blankets that are too big for your bed. They should not hang down onto the floor.  Have a firm chair that has side  arms. You can use this for support while you get dressed.  Do not have throw rugs and other things on the floor that can make you trip. What can I do in the kitchen?  Clean up any spills right away.  Avoid walking on wet floors.  Keep items that you use a lot in easy-to-reach places.  If you need to reach something above you, use a strong step stool that has a grab bar.  Keep electrical cords out of the way.  Do not use floor polish or wax that makes floors slippery. If you must use wax, use non-skid floor wax.  Do not have throw rugs and other things on the floor that can make you trip. What can I do with my stairs?  Do not leave any items  on the stairs.  Make sure that there are handrails on both sides of the stairs and use them. Fix handrails that are broken or loose. Make sure that handrails are as long as the stairways.  Check any carpeting to make sure that it is firmly attached to the stairs. Fix any carpet that is loose or worn.  Avoid having throw rugs at the top or bottom of the stairs. If you do have throw rugs, attach them to the floor with carpet tape.  Make sure that you have a light switch at the top of the stairs and the bottom of the stairs. If you do not have them, ask someone to add them for you. What else can I do to help prevent falls?  Wear shoes that:  Do not have high heels.  Have rubber bottoms.  Are comfortable and fit you well.  Are closed at the toe. Do not wear sandals.  If you use a stepladder:  Make sure that it is fully opened. Do not climb a closed stepladder.  Make sure that both sides of the stepladder are locked into place.  Ask someone to hold it for you, if possible.  Clearly mark and make sure that you can see:  Any grab bars or handrails.  First and last steps.  Where the edge of each step is.  Use tools that help you move around (mobility aids) if they are needed. These include:  Canes.  Walkers.  Scooters.  Crutches.  Turn on the lights when you go into a dark area. Replace any light bulbs as soon as they burn out.  Set up your furniture so you have a clear path. Avoid moving your furniture around.  If any of your floors are uneven, fix them.  If there are any pets around you, be aware of where they are.  Review your medicines with your doctor. Some medicines can make you feel dizzy. This can increase your chance of falling. Ask your doctor what other things that you can do to help prevent falls. This information is not intended to replace advice given to you by your health care provider. Make sure you discuss any questions you have with your health care  provider. Document Released: 05/29/2009 Document Revised: 01/08/2016 Document Reviewed: 09/06/2014 Elsevier Interactive Patient Education  2017 [employer].

## 2020-11-19 NOTE — Assessment & Plan Note (Signed)
Chronic Occasional Continue Pepcid 20 mg daily as needed

## 2020-11-19 NOTE — Assessment & Plan Note (Signed)
Chronic Worse after excessive salt intake Taking lasix 20 mg daily prn -- continue

## 2020-11-19 NOTE — Assessment & Plan Note (Signed)
Chronic Check A1c Continue healthy diet and regular exercise

## 2020-11-19 NOTE — Assessment & Plan Note (Signed)
Chronic Has not tolerated any statins in the past secondary to leg pain She is willing to try Zetia 10 mg daily-we will start Lipid panel, CMP now-we will need to recheck in 6 weeks - 2 months Continue regular exercise and heart healthy diet

## 2020-11-19 NOTE — Progress Notes (Signed)
Subjective:   [name] [name] is a 80 y.o. female who presents for Medicare Annual (Subsequent) preventive examination.  Review of Systems    No ROS. Medicare Wellness Visit. Additional risk factors are reflected in social history. Cardiac Risk Factors include: advanced age (>75men, >51 women);dyslipidemia;hypertension;family history of premature cardiovascular disease     Objective:    Today's Vitals   11/19/20 1106  BP: 130/78  Pulse: 93  Temp: 97.9 F (36.6 C)  SpO2: 98%  Weight: 164 lb 10.9 oz (74.7 kg)  Height: 5\' 5"  (1.651 m)   Body mass index is 27.4 kg/m.  Advanced Directives 11/19/2020 10/10/2020 01/19/2019 01/04/2018 07/24/2017 06/15/2017 09/14/2016  Does Patient Have a Medical Advance Directive? Yes Yes Yes Yes Yes Yes Yes  Type of Advance Directive - [place];Living will [place];Living will [place];Living will [place];Living will [place];Living will [place];Living will  Does patient want to make changes to medical advance directive? No - Patient declined - - - - - -  Copy of Healthcare Power of Attorney in Chart? - No - copy requested No - copy requested No - copy requested No - copy requested - -  Would patient like information on creating a medical advance directive? - - - - No - Patient declined - -  Pre-existing out of facility DNR order (yellow form or pink MOST form) - - - - - - -    Current Medications (verified) Outpatient Encounter Medications as of 11/19/2020  Medication Sig  . bisoprolol (ZEBETA) 10 MG tablet Take 1 tablet in the morning and 1/2 tablet in the evening  . conjugated estrogens (PREMARIN) vaginal cream Premarin 0.625 mg/gram vaginal cream  Insert 0.5 applicatorsful twice a week by vaginal route.  . dicyclomine (BENTYL) 10 MG capsule Take 1 capsule (10 mg total) by mouth 2 (two) times daily as needed for spasms. (Patient not  taking: Reported on 11/19/2020)  . diltiazem (CARDIZEM CD) 360 MG 24 hr capsule TAKE (1) CAPSULE BY MOUTH ONCE DAILY.  Marland Kitchen Doxylamine Succinate, Sleep, (UNISOM PO) Take by mouth at bedtime as needed.  Marland Kitchen ELIQUIS 5 MG TABS tablet TAKE 1 TABLET BY MOUTH TWICE DAILY  . famotidine (PEPCID) 20 MG tablet Take 20 mg by mouth 2 (two) times daily.  . furosemide (LASIX) 20 MG tablet Take 1 tablet (20 mg total) by mouth daily.  Marland Kitchen spironolactone (ALDACTONE) 25 MG tablet TAKE 1 TABLET BY MOUTH ONCE DAILY.   No facility-administered encounter medications on file as of 11/19/2020.    Allergies (verified) Augmentin [amoxicillin-pot clavulanate], Levaquin [levofloxacin in d5w], Polysporin [bacitracin-polymyxin b], Pravastatin, Sulfa antibiotics, Sulfonamide derivatives, and Erythromycin   History: Past Medical History:  Diagnosis Date  . Allergic rhinitis    takes Benadryl at bedtime  . Allergy   . Anemia   . Angio-edema   . Arthritis    osteoarthritis  . Atrial fibrillation ([place]) 06-10-2011   takes Diltiazem and Pradaxa daily  . Cancer ([place])    skin  . Cataract    bilateral cataracts removed  . Clotting disorder ([place])    on PRADAXA - chronic a fib  . Collagenous colitis    recurrent, takes Budesonide daily as needed   . Colon polyps 01/14/2010   Hyperplastic  . Cough    states every day of her life and every chest xray is always clear  . GERD (gastroesophageal reflux disease)  takes Omeprazole daily  . History of poliomyelitis    Polio  age 74- no significant neuromuscular deficit  . Hyperlipidemia    was on Pravastatin but joint pain;has been off for 2-49months ;takes CoQ10  . Hypertension    takes Bisoprolol and Chlorthalidone daily  . Potassium deficiency    takes KDUR daily   Past Surgical History:  Procedure Laterality Date  . ADENOIDECTOMY    . APPENDECTOMY     at age 78  . BREAST BIOPSY  1947 & 10/07   Benign  . CARDIOVERSION N/A 11/05/2016   Procedure: CARDIOVERSION;   Surgeon: Sueanne Margarita, MD;  [facility];  Service: Cardiovascular;  Laterality: N/A;  . cataracts removed    . COLONOSCOPY    . ENDOVENOUS ABLATION SAPHENOUS VEIN W/ LASER  03-03-2011 left greater saphenous vein   . ENDOVENOUS ABLATION SAPHENOUS VEIN W/ LASER  01-06-2011  right greater saphenous vein  . epidural infection     x 2   . IR RADIOLOGIST EVAL & MGMT  03/07/2017  . SKIN CANCER EXCISION     removal of skin cancer on left leg  . TONSILLECTOMY     as a child  . TONSILLECTOMY    . TOTAL KNEE ARTHROPLASTY Right 09/18/2013   Procedure: TOTAL KNEE ARTHROPLASTY;  Surgeon: Garald Balding, MD;  Location: [facility];  Service: Orthopedics;  Laterality: Right;   Family History  Problem Relation Age of Onset  . Stomach cancer Maternal Grandfather   . Heart disease Paternal Grandfather   . Colon cancer Son   . Rectal cancer Son 15  . Colon cancer Paternal Grandmother   . Alcohol abuse Father   . Heart disease Father   . Stroke Father   . Hypertension Father   . Hypertension Sister   . Other Sister        varicose veins  . Peripheral vascular disease Mother   . Other Mother        varicose veins  . Esophageal cancer Neg Hx   . Pancreatic cancer Neg Hx    Social History   Socioeconomic History  . Marital status: Married    Spouse name: Not on file  . Number of children: 2  . Years of education: Not on file  . Highest education level: Not on file  Occupational History  . Occupation: retired  Tobacco Use  . Smoking status: Former Smoker    Packs/day: 1.00    Years: 18.00    Pack years: 18.00    Types: Cigarettes    Quit date: 08/17/1987    Years since quitting: 33.2  . Smokeless tobacco: Never Used  . Tobacco comment: quit smoking 40yrs ago  Vaping Use  . Vaping Use: Never used  Substance and Sexual Activity  . Alcohol use: Yes    Alcohol/week: 7.0 standard drinks    Types: 7 Glasses of wine per week    Comment: glass of wine daily  . Drug use: No  .  Sexual activity: Not Currently    Partners: Male    Birth control/protection: Surgical  Other Topics Concern  . Not on file  Social History Narrative   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   Married     Former Smoker quit 1989    Alcohol use-yes -  glasses of wine/day      son works for [employer] - she flies to [place] to visit her grandchildren   Social Determinants of  Health   Financial Resource Strain: Low Risk   . Difficulty of Paying Living Expenses: Not hard at all  Food Insecurity: No Food Insecurity  . Worried About Charity fundraiser in the Last Year: Never true  . Ran Out of Food in the Last Year: Never true  Transportation Needs: No Transportation Needs  . Lack of Transportation (Medical): No  . Lack of Transportation (Non-Medical): No  Physical Activity: Sufficiently Active  . Days of Exercise per Week: 5 days  . Minutes of Exercise per Session: 30 min  Stress: No Stress Concern Present  . Feeling of Stress : Not at all  Social Connections: Moderately Integrated  . Frequency of Communication with Friends and Family: More than three times a week  . Frequency of Social Gatherings with Friends and Family: More than three times a week  . Attends Religious Services: More than 4 times per year  . Active Member of Clubs or Organizations: Yes  . Attends Archivist Meetings: More than 4 times per year  . Marital Status: Widowed    Tobacco Counseling Counseling given: Not Answered Comment: quit smoking 88yrs ago   Clinical Intake:  Pre-visit preparation completed: No  Pain : No/denies pain     BMI - recorded: 27.4 Nutritional Status: BMI 25 -29 Overweight Nutritional Risks: None Diabetes: No  How often do you need to have someone help you when you read instructions, pamphlets, or other written materials from your doctor or pharmacy?: 1 - Never What is the last grade level you completed in school?: College; Retired Pharmacist, hospital  Diabetic?  no  Interpreter Needed?: No  Information entered by :: Lisette Abu, LPN   Activities of Daily Living In your present state of health, do you have any difficulty performing the following activities: 11/19/2020 09/23/2020  Hearing? - N  Vision? - N  Difficulty concentrating or making decisions? - N  Walking or climbing stairs? - Y  Dressing or bathing? - N  Doing errands, shopping? - N  Conservation officer, nature and eating ? N -  Using the Toilet? N -  In the past six months, have you accidently leaked urine? N -  Do you have problems with loss of bowel control? N -  Managing your Medications? N -  Managing your Finances? N -  Housekeeping or managing your Housekeeping? N -  Some recent data might be hidden    Patient Care Team: Binnie Rail, MD as PCP - General (Internal Medicine) Stanford Breed Denice Bors, MD as PCP - Cardiology (Cardiology) Lafayette Dragon, MD (Inactive) (Gastroenterology) Garald Balding, MD (Orthopedic Surgery) Lelon Perla, MD (Cardiology)  Indicate any recent Medical Services you may have received from other than [facility] providers in the past year (date may be approximate).     Assessment:   This is a routine wellness examination for Sahira.  Hearing/Vision screen No exam data present  Dietary issues and exercise activities discussed: Current Exercise Habits: Home exercise routine, Type of exercise: walking, Time (Minutes): 30, Frequency (Times/Week): 5, Weekly Exercise (Minutes/Week): 150, Intensity: Moderate, Exercise limited by: cardiac condition(s)  Goals    . Patient Stated     I want to continue to take as much time for myself as possible, enjoy life and family.      Depression Screen PHQ 2/9 Scores 11/19/2020 01/19/2019 01/04/2018 01/03/2017 10/08/2014  PHQ - 2 Score 0 1 1 0 1  PHQ- 9 Score 1 - 2 - -    Fall  Risk Fall Risk  11/19/2020 11/12/2019 01/19/2019 01/04/2018 01/03/2017  Falls in the past year? 0 0 0 Yes No  Number falls in past yr: 0 0 0 2 or more -   Injury with Fall? 0 0 - No -  Risk for fall due to : No Fall Risks - - Impaired balance/gait;Impaired mobility -  Follow up Falls evaluation completed - - Falls prevention discussed -    FALL RISK PREVENTION PERTAINING TO THE HOME:  Any stairs in or around the home? Yes  If so, are there any without handrails? No  Home free of loose throw rugs in walkways, pet beds, electrical cords, etc? Yes  Adequate lighting in your home to reduce risk of falls? Yes   ASSISTIVE DEVICES UTILIZED TO PREVENT FALLS:  Life alert? Yes  Use of a cane, walker or w/c? No  Grab bars in the bathroom? No  Shower chair or bench in shower? Yes  Elevated toilet seat or a handicapped toilet? Yes   TIMED UP AND GO:  Was the test performed? No .  Length of time to ambulate 10 feet: 0 sec.   Gait steady and fast without use of assistive device  Cognitive Function: MMSE - Mini Mental State Exam 01/04/2018  Orientation to time 5  Orientation to Place 5  Registration 3  Attention/ Calculation 5  Recall 3  Language- name 2 objects 2  Language- repeat 1  Language- follow 3 step command 3  Language- read & follow direction 1  Write a sentence 1  Copy design 1  Total score 30        Immunizations Immunization History  Administered Date(s) Administered  . Influenza Split 06/29/2011, 06/09/2012  . Influenza Whole 06/05/2007, 06/04/2008, 04/27/2009  . Influenza, High Dose Seasonal PF 06/05/2018, 05/09/2020  . Influenza,inj,quad, With Preservative 06/11/2019  . Influenza-Unspecified 05/16/2013, 06/17/2015, 06/15/2017, 05/23/2019  . Moderna Sars-Covid-2 Vaccination 08/23/2019, 09/21/2019, 06/12/2020  . Pneumococcal Conjugate-13 07/24/2013  . Pneumococcal Polysaccharide-23 04/23/2008  . Td 10/20/1998, 09/19/2009  . Zoster 01/25/2006    TDAP status: Due, Education has been provided regarding the importance of this vaccine. Advised may receive this vaccine at local pharmacy or Health Dept. Aware to  provide a copy of the vaccination record if obtained from local pharmacy or Health Dept. Verbalized acceptance and understanding.  Flu Vaccine status: Up to date  Pneumococcal vaccine status: Up to date  Covid-19 vaccine status: Completed vaccines  Qualifies for Shingles Vaccine? Yes   Zostavax completed Yes   Shingrix Completed?: No.    Education has been provided regarding the importance of this vaccine. Patient has been advised to call insurance company to determine out of pocket expense if they have not yet received this vaccine. Advised may also receive vaccine at local pharmacy or Health Dept. Verbalized acceptance and understanding.  Screening Tests Health Maintenance  Topic Date Due  . DEXA SCAN  11/19/2021 (Originally 10/25/2005)  . TETANUS/TDAP  11/19/2021 (Originally 09/20/2019)  . COVID-19 Vaccine (4 - Booster for Moderna series) 12/11/2020  . INFLUENZA VACCINE  03/16/2021  . COLONOSCOPY (Pts 45-68yrs Insurance coverage will need to be confirmed)  10/23/2021  . PNA vac Low Risk Adult  Completed  . HPV VACCINES  Aged Out    Health Maintenance  There are no preventive care reminders to display for this patient.  Colorectal cancer screening: Type of screening: Colonoscopy. Completed 10/24/2018. Repeat every 3 years  Mammogram status: Completed 03/03/2020. Repeat every year  Bone density status: never done  Lung Cancer Screening: (Low Dose CT Chest recommended if Age 33-80 years, 30 pack-year currently smoking OR have quit w/in 15years.) does not qualify.   Lung Cancer Screening Referral: no  Additional Screening:  Hepatitis C Screening: does not qualify; Completed no  Vision Screening: Recommended annual ophthalmology exams for early detection of glaucoma and other disorders of the eye. Is the patient up to date with their annual eye exam?  Yes  Who is the provider or what is the name of the office in which the patient attends annual eye exams? Dr. Carlos Levering. [place] If  pt is not established with a provider, would they like to be referred to a provider to establish care? No .   Dental Screening: Recommended annual dental exams for proper oral hygiene  Community Resource Referral / Chronic Care Management: CRR required this visit?  No   CCM required this visit?  No      Plan:     I have personally reviewed and noted the following in the patient's chart:   . Medical and social history . Use of alcohol, tobacco or illicit drugs  . Current medications and supplements . Functional ability and status . Nutritional status . Physical activity . Advanced directives . List of other physicians . Hospitalizations, surgeries, and ER visits in previous 12 months . Vitals . Screenings to include cognitive, depression, and falls . Referrals and appointments  In addition, I have reviewed and discussed with patient certain preventive protocols, quality metrics, and best practice recommendations. A written personalized care plan for preventive services as well as general preventive health recommendations were provided to patient.     Sheral Flow, LPN   09/22/7410   Nurse Notes:  Normal cognitive status assessed by direct observation by this Nurse Health Advisor. No abnormalities found.  Medications reviewed with patient; no opioid use noted

## 2020-11-19 NOTE — Assessment & Plan Note (Signed)
Chronic Asymptomatic Following with cardiology On bisoprolol, Cardizem and Eliquis

## 2020-11-19 NOTE — Assessment & Plan Note (Addendum)
Chronic BP well controlled Continue zebeta 10 mg daily, cardizem 360 mg daily, lasix 20 mg daily prn for edema, spironolactone 25 mg cmp

## 2020-11-19 NOTE — Assessment & Plan Note (Signed)
Chronic, intermittent Resolved Has had intermittent bouts of colitis Responds quickly to short course of steroids-recent negative course and is doing well now

## 2020-11-24 ENCOUNTER — Other Ambulatory Visit: Payer: Self-pay | Admitting: Cardiology

## 2020-11-26 ENCOUNTER — Encounter: Payer: Self-pay | Admitting: Physical Therapy

## 2020-11-26 ENCOUNTER — Ambulatory Visit: Payer: Medicare PPO | Attending: Gastroenterology | Admitting: Physical Therapy

## 2020-11-26 ENCOUNTER — Other Ambulatory Visit: Payer: Self-pay

## 2020-11-26 DIAGNOSIS — R279 Unspecified lack of coordination: Secondary | ICD-10-CM | POA: Diagnosis not present

## 2020-11-26 DIAGNOSIS — M6281 Muscle weakness (generalized): Secondary | ICD-10-CM | POA: Diagnosis not present

## 2020-11-26 NOTE — Patient Instructions (Signed)
Access Code: [email or web address] URL: [email or web address] Date: 11/26/2020 Prepared by: Jari Favre  Exercises Seated Scapular Retraction - 3 x daily - 7 x weekly - 1 sets - 10 reps - 5 sec hold Seated Correct Posture - 1 x daily - 7 x weekly - 3 sets - 10 reps Supine Hip Adductor Squeeze with Small Ball - 3 x daily - 7 x weekly - 1 sets - 10 reps - 3 sec hold Seated Bilateral Shoulder External Rotation with Resistance - 1 x daily - 7 x weekly - 2 sets - 10 reps Standing Bilateral Low Shoulder Row with Anchored Resistance - 1 x daily - 7 x weekly - 3 sets - 10 reps Seated Long Arc Quad with Hip Adduction - 1 x daily - 7 x weekly - 3 sets - 10 reps Standing Low Back Flexion at Table - 3 x daily - 7 x weekly - 1 sets - 5 reps - 10; rest 10 hold Seated Pelvic Floor Contraction with Hip Abduction and Resistance Loop - 3 x daily - 7 x weekly - 10 reps - 1 sets - 3 sec hold  Patient Education Office Posture

## 2020-11-26 NOTE — Therapy (Signed)
[facility] [address] [place], [place], [address] Phone: [phone]   Fax:  [phone]  Physical Therapy Treatment  Patient Details  Name: [name] [name] MRN: [id number] Date of Birth: Feb 16, 1941 Referring Provider (PT): Mauri Pole, MD   Encounter Date: 11/26/2020   PT End of Session - 11/26/20 1525    Visit Number 4    Date for PT Re-Evaluation 12/05/20    Authorization Type [employer]    PT Start Time 0160    PT Stop Time 1526    PT Time Calculation (min) 39 min    Activity Tolerance Patient tolerated treatment well    Behavior During Therapy [facility] for tasks assessed/performed           Past Medical History:  Diagnosis Date  . Allergic rhinitis    takes Benadryl at bedtime  . Allergy   . Anemia   . Angio-edema   . Arthritis    osteoarthritis  . Atrial fibrillation ([place]) 06-10-2011   takes Diltiazem and Pradaxa daily  . Cancer ([place])    skin  . Cataract    bilateral cataracts removed  . Clotting disorder ([place])    on PRADAXA - chronic a fib  . Collagenous colitis    recurrent, takes Budesonide daily as needed   . Colon polyps 01/14/2010   Hyperplastic  . Cough    states every day of her life and every chest xray is always clear  . GERD (gastroesophageal reflux disease)    takes Omeprazole daily  . History of poliomyelitis    Polio  age 46- no significant neuromuscular deficit  . Hyperlipidemia    was on Pravastatin but joint pain;has been off for 2-65month ;takes CoQ10  . Hypertension    takes Bisoprolol and Chlorthalidone daily  . Potassium deficiency    takes KDUR daily    Past Surgical History:  Procedure Laterality Date  . ADENOIDECTOMY    . APPENDECTOMY     at age 80 . BREAST BIOPSY  1947 & 10/07   Benign  . CARDIOVERSION N/A 11/05/2016   Procedure: CARDIOVERSION;  Surgeon: TSueanne Margarita MD;  [facility];  Service: Cardiovascular;  Laterality: N/A;  . cataracts  removed    . COLONOSCOPY    . ENDOVENOUS ABLATION SAPHENOUS VEIN W/ LASER  03-03-2011 left greater saphenous vein   . ENDOVENOUS ABLATION SAPHENOUS VEIN W/ LASER  01-06-2011  right greater saphenous vein  . epidural infection     x 2   . IR RADIOLOGIST EVAL & MGMT  03/07/2017  . SKIN CANCER EXCISION     removal of skin cancer on left leg  . TONSILLECTOMY     as a child  . TONSILLECTOMY    . TOTAL KNEE ARTHROPLASTY Right 09/18/2013   Procedure: TOTAL KNEE ARTHROPLASTY;  Surgeon: PGarald Balding MD;  Location: [facility]  Service: Orthopedics;  Laterality: Right;    There were no vitals filed for this visit.   Subjective Assessment - 11/26/20 1456    Subjective Pt states she did the exercises but had to modify when having some pain    Pertinent History collagenous colitis, chronic diarrhea    Currently in Pain? No/denies                             [facility] 0001      Lumbar Exercises: Standing  Other Standing Lumbar Exercises modifiend plank at counter - kegel and hold up to 10 sec    Other Standing Lumbar Exercises shoulder ER against wall - isometric for less shoulder irritation      Lumbar Exercises: Seated   Long Arc Quad on Chair Strengthening;Both;10 reps   ball squeeze   Other Seated Lumbar Exercises ball squeeze 20x; clam sitting yellow loop 20x    Other Seated Lumbar Exercises clam red band in chair                  PT Education - 11/26/20 1525    Education Details Access Code: [email or web address]    Person(s) Educated Patient    Methods Explanation;Demonstration;Tactile cues;Verbal cues;Handout    Comprehension Verbalized understanding;Returned demonstration            PT Short Term Goals - 11/13/20 1026      PT SHORT TERM GOAL #1   Title ind with toileting techniques    Status Achieved             PT Long Term Goals - 11/26/20 1459      PT LONG TERM GOAL #1   Title Pt will be ind with HEP to help  control BM and bladder    Status Achieved      PT LONG TERM GOAL #2   Title Pt will have 3 or less BMs per day    Baseline 2x/ day    Status Achieved      PT LONG TERM GOAL #3   Title Pt will rate ugency of BM 3/10 or less    Baseline 4/10 down from 5/10 - don't wait because I do know I may have problem    Status Partially Met                 Plan - 11/26/20 1527    Clinical Impression Statement Pt has been doing well with HEP and is ind.  Pt has met most goals so she will d/c from PT today.    PT Treatment/Interventions ADLs/Self Care Home Management;Biofeedback;Cryotherapy;Electrical Stimulation;Moist Heat;Therapeutic activities;Therapeutic exercise;Neuromuscular re-education;Patient/family education;Manual techniques;Taping;Passive range of motion;Dry needling    PT Next Visit Plan d/c today    PT Home Exercise Plan Access Code: [email or web address]    Consulted and Agree with Plan of Care Patient           Patient will benefit from skilled therapeutic intervention in order to improve the following deficits and impairments:  Postural dysfunction,Impaired flexibility,Decreased strength,Decreased coordination  Visit Diagnosis: Muscle weakness (generalized)  Unspecified lack of coordination     Problem List Patient Active Problem List   Diagnosis Date Noted  . Aortic atherosclerosis ([place]) 11/19/2020  . Bladder prolapse, female, acquired 11/12/2019  . Chronic sinusitis 12/06/2018  . Hyperglycemia 01/03/2017  . DDD (degenerative disc disease), lumbar 01/03/2017  . Bilateral leg edema 04/13/2016  . Osteoarthritis of right knee 09/21/2013  . S/P total knee replacement using cement 09/18/2013  . Anisocoria 07/26/2013  . Varicose veins of bilateral lower extremities with other complications 95/18/8416  . Paroxysmal Atrial Fibrillation 06/29/2011  . COUGH, CHRONIC 03/09/2010  . HIP PAIN, BILATERAL 07/17/2009  . ALLERGIC RHINITIS 04/23/2008  . GERD 11/01/2007  . Essential  hypertension 09/20/2007  . SKIN CANCER, LEG 07/03/2007  . Hyperlipidemia 07/03/2007  . Colitis, collagenous 07/03/2007    Jule Ser, PT 11/26/2020, 3:34 PM   [facility] [address]  [place], [place], [address] Phone: [phone]  Fax:  [phone]  Name: [name] [name] MRN: [id number] Date of Birth: 1941/02/05  PHYSICAL THERAPY DISCHARGE SUMMARY  Visits from Start of Care: 4  Current functional level related to goals / functional outcomes: See above goals   Remaining deficits: See above   Education / Equipment: HEP  Plan: Patient agrees to discharge.  Patient goals were met. Patient is being discharged due to being pleased with the current functional level.  ?????     [employer], PT 11/26/20 3:34 PM

## 2020-12-01 ENCOUNTER — Encounter: Payer: Medicare PPO | Admitting: Physical Therapy

## 2020-12-05 ENCOUNTER — Other Ambulatory Visit: Payer: Self-pay

## 2020-12-05 ENCOUNTER — Ambulatory Visit (INDEPENDENT_AMBULATORY_CARE_PROVIDER_SITE_OTHER): Payer: Medicare PPO

## 2020-12-05 DIAGNOSIS — B351 Tinea unguium: Secondary | ICD-10-CM

## 2020-12-05 NOTE — Progress Notes (Signed)
Patient presents today for the 3RD laser treatment. Diagnosed with mycotic nail infection by Dr. Posey Pronto.   Toenail most affected left hallux, 2nd, 4th, 5th and right hallux, 3rd and 4th.  All other systems are negative.  Nails were filed thin. Laser therapy was administered to left hallux, 2nd, 4th, 5th and right hallux, 3rd and 4th  toenails and patient tolerated the treatment well. All safety precautions were in place.    Follow up in 4 weeks for laser # 4.

## 2020-12-08 ENCOUNTER — Encounter: Payer: Medicare PPO | Admitting: Physical Therapy

## 2020-12-09 ENCOUNTER — Encounter: Payer: Medicare PPO | Admitting: Physical Therapy

## 2020-12-15 ENCOUNTER — Other Ambulatory Visit: Payer: Self-pay

## 2020-12-15 DIAGNOSIS — I83893 Varicose veins of bilateral lower extremities with other complications: Secondary | ICD-10-CM

## 2020-12-22 ENCOUNTER — Encounter: Payer: Medicare PPO | Admitting: Physical Therapy

## 2020-12-24 NOTE — Progress Notes (Deleted)
HPI: FU AFib. Previously had cardioversion but did not hold sinus rhythm and now atrial fibrillation is managed with rate control and anticoagulation. Echocardiogram January 2022 showed normal LV function, moderate left atrial enlargement, mild mitral regurgitation, mild to moderate tricuspid regurgitation, mild aortic insufficiency.  Nuclear study February 2022 showed fixed apical defect but no ischemia.  Monitor February 2022 showed atrial fibrillation with PVCs or aberrantly conducted beats rate upper normal to mildly increased.  Since I last saw her,   Current Outpatient Medications  Medication Sig Dispense Refill  . bisoprolol (ZEBETA) 10 MG tablet Take 1 tablet in the morning and 1/2 tablet in the evening 135 tablet 3  . conjugated estrogens (PREMARIN) vaginal cream Premarin 0.625 mg/gram vaginal cream  Insert 0.5 applicatorsful twice a week by vaginal route.    . dicyclomine (BENTYL) 10 MG capsule Take 1 capsule (10 mg total) by mouth 2 (two) times daily as needed for spasms. (Patient not taking: Reported on 11/19/2020) 90 capsule 1  . diltiazem (CARDIZEM CD) 360 MG 24 hr capsule TAKE (1) CAPSULE BY MOUTH ONCE DAILY. 90 capsule 0  . Doxylamine Succinate, Sleep, (UNISOM PO) Take by mouth at bedtime as needed.    Marland Kitchen ELIQUIS 5 MG TABS tablet TAKE 1 TABLET BY MOUTH TWICE DAILY 180 tablet 1  . ezetimibe (ZETIA) 10 MG tablet Take 1 tablet (10 mg total) by mouth daily. 90 tablet 3  . famotidine (PEPCID) 20 MG tablet Take 20 mg by mouth 2 (two) times daily.    . furosemide (LASIX) 20 MG tablet Take 1 tablet (20 mg total) by mouth daily. 90 tablet 2  . spironolactone (ALDACTONE) 25 MG tablet TAKE 1 TABLET BY MOUTH ONCE DAILY. 90 tablet 0   No current facility-administered medications for this visit.     Past Medical History:  Diagnosis Date  . Allergic rhinitis    takes Benadryl at bedtime  . Allergy   . Anemia   . Angio-edema   . Arthritis    osteoarthritis  . Atrial fibrillation  ([place]) 06-10-2011   takes Diltiazem and Pradaxa daily  . Cancer ([place])    skin  . Cataract    bilateral cataracts removed  . Clotting disorder ([place])    on PRADAXA - chronic a fib  . Collagenous colitis    recurrent, takes Budesonide daily as needed   . Colon polyps 01/14/2010   Hyperplastic  . Cough    states every day of her life and every chest xray is always clear  . GERD (gastroesophageal reflux disease)    takes Omeprazole daily  . History of poliomyelitis    Polio  age 56- no significant neuromuscular deficit  . Hyperlipidemia    was on Pravastatin but joint pain;has been off for 2-90months ;takes CoQ10  . Hypertension    takes Bisoprolol and Chlorthalidone daily  . Potassium deficiency    takes KDUR daily    Past Surgical History:  Procedure Laterality Date  . ADENOIDECTOMY    . APPENDECTOMY     at age 47  . BREAST BIOPSY  1947 & 10/07   Benign  . CARDIOVERSION N/A 11/05/2016   Procedure: CARDIOVERSION;  Surgeon: Sueanne Margarita, MD;  Location: [facility];  Service: Cardiovascular;  Laterality: N/A;  . cataracts removed    . COLONOSCOPY    . ENDOVENOUS ABLATION SAPHENOUS VEIN W/ LASER  03-03-2011 left greater saphenous vein   . ENDOVENOUS ABLATION SAPHENOUS VEIN W/ LASER  01-06-2011  right greater saphenous vein  . epidural infection     x 2   . IR RADIOLOGIST EVAL & MGMT  03/07/2017  . SKIN CANCER EXCISION     removal of skin cancer on left leg  . TONSILLECTOMY     as a child  . TONSILLECTOMY    . TOTAL KNEE ARTHROPLASTY Right 09/18/2013   Procedure: TOTAL KNEE ARTHROPLASTY;  Surgeon: Garald Balding, MD;  Location: [facility];  Service: Orthopedics;  Laterality: Right;    Social History   Socioeconomic History  . Marital status: Married    Spouse name: Not on file  . Number of children: 2  . Years of education: Not on file  . Highest education level: Not on file  Occupational History  . Occupation: retired  Tobacco Use  . Smoking status: Former Smoker     Packs/day: 1.00    Years: 18.00    Pack years: 18.00    Types: Cigarettes    Quit date: 08/17/1987    Years since quitting: 33.3  . Smokeless tobacco: Never Used  . Tobacco comment: quit smoking 67yrs ago  Vaping Use  . Vaping Use: Never used  Substance and Sexual Activity  . Alcohol use: Yes    Alcohol/week: 7.0 standard drinks    Types: 7 Glasses of wine per week    Comment: glass of wine daily  . Drug use: No  . Sexual activity: Not Currently    Partners: Male    Birth control/protection: Surgical  Other Topics Concern  . 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 Difficulty of Paying Living Expenses: Not hard at all  Food Insecurity: No Food Insecurity  . Worried About Charity fundraiser in the Last Year: Never true  . Ran Out of Food in the Last Year: Never true  Transportation Needs: No Transportation Needs  . Lack of Transportation (Medical): No  . Lack of Transportation (Non-Medical): No  Physical Activity: Sufficiently Active  . Days of Exercise per Week: 5 days  . Minutes of Exercise per Session: 30 min  Stress: No Stress Concern Present  . Feeling of Stress : Not at all  Social Connections: Moderately Integrated  . Frequency of Communication with Friends and Family: More than three times a week  . Frequency of Social Gatherings with Friends and Family: More than three times a week  . Attends Religious Services: More than 4 times per year  . Active Member of Clubs or Organizations: Yes  . Attends Archivist Meetings: More than 4 times per year  . Marital Status: Widowed  Intimate Partner Violence: Not on file    Family History  Problem Relation Age of Onset  . Stomach cancer  Maternal Grandfather   . Heart disease Paternal Grandfather   . Colon cancer Son   . Rectal cancer Son 69  . Colon cancer Paternal Grandmother   . Alcohol abuse Father   . Heart disease Father   . Stroke Father   . Hypertension Father   . Hypertension Sister   . Other Sister  varicose veins  . Peripheral vascular disease Mother   . Other Mother        varicose veins  . Esophageal cancer Neg Hx   . Pancreatic cancer Neg Hx     ROS: no fevers or chills, productive cough, hemoptysis, dysphasia, odynophagia, melena, hematochezia, dysuria, hematuria, rash, seizure activity, orthopnea, PND, pedal edema, claudication. Remaining systems are negative.  Physical Exam: Well-developed well-nourished in no acute distress.  Skin is warm and dry.  HEENT is normal.  Neck is supple.  Chest is clear to auscultation with normal expansion.  Cardiovascular exam is regular rate and rhythm.  Abdominal exam nontender or distended. No masses palpated. Extremities show no edema. neuro grossly intact  ECG- personally reviewed  A/P  1 permanent atrial fibrillation-we will continue Cardizem and beta-blocker for rate control.  Continue apixaban.  2 hypertension-blood pressure controlled.  Continue present medications.  3 hyperlipidemia-intolerant to statins.  Continue diet.  4 chronic pedal edema-reasonably well-controlled.  Continue diuretic at present dose.  [name], MD

## 2020-12-25 ENCOUNTER — Ambulatory Visit (HOSPITAL_COMMUNITY)
Admission: RE | Admit: 2020-12-25 | Discharge: 2020-12-25 | Disposition: A | Discharge status: Home / Self Care | Payer: Medicare PPO | Source: Ambulatory Visit | Attending: Vascular Surgery | Admitting: Vascular Surgery

## 2020-12-25 ENCOUNTER — Ambulatory Visit: Payer: Medicare PPO | Admitting: Vascular Surgery

## 2020-12-25 ENCOUNTER — Encounter: Payer: Self-pay | Admitting: Vascular Surgery

## 2020-12-25 ENCOUNTER — Other Ambulatory Visit: Payer: Self-pay

## 2020-12-25 VITALS — BP 146/73 | HR 77 | Resp 20 | Ht 65.0 in | Wt 167.0 lb

## 2020-12-25 DIAGNOSIS — I83893 Varicose veins of bilateral lower extremities with other complications: Secondary | ICD-10-CM | POA: Diagnosis not present

## 2020-12-25 DIAGNOSIS — I83813 Varicose veins of bilateral lower extremities with pain: Secondary | ICD-10-CM | POA: Diagnosis not present

## 2020-12-25 NOTE — Progress Notes (Signed)
Patient name: [name] [name] MRN: [id number] DOB: 03-15-1941 Sex: female  HPI: [name] [name] is a 80 y.o. female, who several weeks ago had bilateral leg weakness.  She was worried that her varicose veins may have been causing this.  She continues to wear her knee-high compression stockings.  She has previously had bilateral laser ablation of her greater saphenous vein by Dr. Donnetta Hutching in 2012.  She still had some persistent edema at that point from the knees down to the ankles.  She states this has been fairly stable.  She has not really complained about any recurrent varicosities but was wondering whether or not the leg weakness may have been related.  They changed her medication some recently and the leg weakness has significantly improved but she decided to keep her follow-up appointment.  She does have a history of atrial fibrillation.   Past Medical History:  Diagnosis Date  . Allergic rhinitis    takes Benadryl at bedtime  . Allergy   . Anemia   . Angio-edema   . Arthritis    osteoarthritis  . Atrial fibrillation ([place]) 06-10-2011   takes Diltiazem and Pradaxa daily  . Cancer ([place])    skin  . Cataract    bilateral cataracts removed  . Clotting disorder ([place])    on PRADAXA - chronic a fib  . Collagenous colitis    recurrent, takes Budesonide daily as needed   . Colon polyps 01/14/2010   Hyperplastic  . Cough    states every day of her life and every chest xray is always clear  . GERD (gastroesophageal reflux disease)    takes Omeprazole daily  . History of poliomyelitis    Polio  age 17- no significant neuromuscular deficit  . Hyperlipidemia    was on Pravastatin but joint pain;has been off for 2-12months ;takes CoQ10  . Hypertension    takes Bisoprolol and Chlorthalidone daily  . Potassium deficiency    takes KDUR daily   Past Surgical History:  Procedure Laterality Date  . ADENOIDECTOMY    . APPENDECTOMY     at age 73  . BREAST BIOPSY  1947 & 10/07   Benign  .  CARDIOVERSION N/A 11/05/2016   Procedure: CARDIOVERSION;  Surgeon: Sueanne Margarita, MD;  [facility];  Service: Cardiovascular;  Laterality: N/A;  . cataracts removed    . COLONOSCOPY    . ENDOVENOUS ABLATION SAPHENOUS VEIN W/ LASER  03-03-2011 left greater saphenous vein   . ENDOVENOUS ABLATION SAPHENOUS VEIN W/ LASER  01-06-2011  right greater saphenous vein  . epidural infection     x 2   . IR RADIOLOGIST EVAL & MGMT  03/07/2017  . SKIN CANCER EXCISION     removal of skin cancer on left leg  . TONSILLECTOMY     as a child  . TONSILLECTOMY    . TOTAL KNEE ARTHROPLASTY Right 09/18/2013   Procedure: TOTAL KNEE ARTHROPLASTY;  Surgeon: Garald Balding, MD;  Location: [facility];  Service: Orthopedics;  Laterality: Right;    Family History  Problem Relation Age of Onset  . Stomach cancer Maternal Grandfather   . Heart disease Paternal Grandfather   . Colon cancer Son   . Rectal cancer Son 70  . Colon cancer Paternal Grandmother   . Alcohol abuse Father   . Heart disease Father   . Stroke Father   . Hypertension Father   . Hypertension Sister   . Other Sister  varicose veins  . Peripheral vascular disease Mother   . Other Mother        varicose veins  . Esophageal cancer Neg Hx   . Pancreatic cancer Neg Hx     SOCIAL HISTORY: Social History   Socioeconomic History  . Marital status: Married    Spouse name: Not on file  . Number of children: 2  . Years of education: Not on file  . Highest education level: Not on file  Occupational History  . Occupation: retired  Tobacco Use  . Smoking status: Former Smoker    Packs/day: 1.00    Years: 18.00    Pack years: 18.00    Types: Cigarettes    Quit date: 08/17/1987    Years since quitting: 33.3  . Smokeless tobacco: Never Used  . Tobacco comment: quit smoking 54yrs ago  Vaping Use  . Vaping Use: Never used  Substance and Sexual Activity  . Alcohol use: Yes    Alcohol/week: 7.0 standard drinks    Types: 7  Glasses of wine per week    Comment: glass of wine daily  . Drug use: No  . Sexual activity: Not Currently    Partners: Male    Birth control/protection: Surgical  Other Topics Concern  . 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 Difficulty of Paying Living Expenses: Not hard at all  Food Insecurity: No Food Insecurity  . Worried About Charity fundraiser in the Last Year: Never true  . Ran Out of Food in the Last Year: Never true  Transportation Needs: No Transportation Needs  . Lack of Transportation (Medical): No  . Lack of Transportation (Non-Medical): No  Physical Activity: Sufficiently Active  . Days of Exercise per Week: 5 days  . Minutes of Exercise per Session: 30 min  Stress: No Stress Concern Present  . Feeling of Stress : Not at all  Social Connections: Moderately Integrated  . Frequency of Communication with Friends and Family: More than three times a week  . Frequency of Social Gatherings with Friends and Family: More than three times a week  . Attends Religious Services: More than 4 times per year  . Active Member of Clubs or Organizations: Yes  . Attends Archivist Meetings: More than 4 times per year  . Marital Status: Widowed  Intimate Partner Violence: Not on file    Allergies  Allergen Reactions  . Augmentin [Amoxicillin-Pot Clavulanate] Diarrhea  . Levaquin [Levofloxacin In D5w]     Severe join pain, muscle pain, and tendonitis  . Polysporin [Bacitracin-Polymyxin B]     Blisters   . Pravastatin Other (See Comments)    Muscle pain and weakness  . Sulfa Antibiotics   . Sulfonamide Derivatives Hives    Childhood reaction  . Erythromycin Swelling and Rash    Current  Outpatient Medications  Medication Sig Dispense Refill  . bisoprolol (ZEBETA) 10 MG tablet Take 1 tablet in the morning and 1/2 tablet in the evening 135 tablet 3  . conjugated estrogens (PREMARIN) vaginal cream Premarin 0.625 mg/gram vaginal cream  Insert 0.5 applicatorsful twice a  week by vaginal route.    . dicyclomine (BENTYL) 10 MG capsule Take 1 capsule (10 mg total) by mouth 2 (two) times daily as needed for spasms. 90 capsule 1  . diltiazem (CARDIZEM CD) 360 MG 24 hr capsule TAKE (1) CAPSULE BY MOUTH ONCE DAILY. 90 capsule 0  . Doxylamine Succinate, Sleep, (UNISOM PO) Take by mouth at bedtime as needed.    Marland Kitchen ELIQUIS 5 MG TABS tablet TAKE 1 TABLET BY MOUTH TWICE DAILY 180 tablet 1  . ezetimibe (ZETIA) 10 MG tablet Take 1 tablet (10 mg total) by mouth daily. 90 tablet 3  . famotidine (PEPCID) 20 MG tablet Take 20 mg by mouth 2 (two) times daily.    . furosemide (LASIX) 20 MG tablet Take 1 tablet (20 mg total) by mouth daily. 90 tablet 2  . spironolactone (ALDACTONE) 25 MG tablet TAKE 1 TABLET BY MOUTH ONCE DAILY. 90 tablet 0   No current facility-administered medications for this visit.    ROS:   General:  No weight loss, Fever, chills  HEENT: No recent headaches, no nasal bleeding, no visual changes, no sore throat  Neurologic: No dizziness, blackouts, seizures. No recent symptoms of stroke or mini- stroke. No recent episodes of slurred speech, or temporary blindness.  Cardiac: No recent episodes of chest pain/pressure, no shortness of breath at rest.  No shortness of breath with exertion.  Denies history of atrial fibrillation or irregular heartbeat  Vascular: No history of rest pain in feet.  No history of claudication.  No history of non-healing ulcer, No history of DVT   Pulmonary: No home oxygen, + productive cough, no hemoptysis,  No asthma or wheezing  Musculoskeletal:  [ ]  Arthritis, [ ]  Low back pain,  [ ]  Joint pain  Hematologic:No history of hypercoagulable state.   No history of easy bleeding.  No history of anemia  Gastrointestinal: No hematochezia or melena,  No gastroesophageal reflux, no trouble swallowing  Urinary: [ ]  chronic Kidney disease, [ ]  on HD - [ ]  MWF or [ ]  TTHS, [ ]  Burning with urination, [ ]  Frequent urination, [ ]  Difficulty urinating;   Skin: No rashes  Psychological: No history of anxiety,  No history of depression   Physical Examination  Vitals:   12/25/20 1231  BP: (!) 146/73  Pulse: 77  Resp: 20  SpO2: 95%  Weight: 167 lb (75.8 kg)  Height: 5\' 5"  (1.651 m)    Body mass index is 27.79 kg/m.  General:  Alert and oriented, no acute distress HEENT: Normal Neck: No JVD Cardiac: Regular Rate and Rhythm Skin: No rash, diffuse small reticular and spider type varicosities both legs Extremity Pulses:  2+ radial, brachial, femoral, 1+ left 2+ right dorsalis pedis Musculoskeletal: No deformity trace ankle edema bilaterally  Neurologic: Upper and lower extremity motor 5/5 and symmetric  DATA:  Patient had a venous reflux exam today which shows a 3 to 5 mm greater saphenous vein and anterior accessory saphenous vein was 4 mm in diameter on the right leg.  There was some reflux within it.  The left greater saphenous vein has been obliterated from previous laser ablation but she did have deep vein reflux.  ASSESSMENT: Minimally symptomatic leg swelling no evidence of recurrent greater saphenous reflux on the left side has deep vein reflux mild recurrence on the right side but not really significant symptoms.   PLAN: Was reassured that she has good arterial circulation that her vein problems are fairly mild in nature  currently and her symptoms are resolved with compression stockings.  She will try to continue to wear these.  She will follow-up on an as-needed basis.   Ruta Hinds, MD [facility] Office: [phone]

## 2020-12-29 ENCOUNTER — Telehealth: Payer: Self-pay | Admitting: Cardiology

## 2020-12-29 NOTE — Telephone Encounter (Signed)
Spoke with pt, she would like to push her appointment out to october which is fine with [place]. Recall placed.

## 2020-12-29 NOTE — Telephone Encounter (Signed)
Patient has an appointment 5/20, but she thinks it was scheduled back in January prior to her testong and seeing a PA. She states she is not sure if she needs the appointment or not. She states she saw the PA in March and just saw her PCP in April. She states she does not live in [place] and would rather not come in again this soon if it is not needed. Please advise.

## 2020-12-29 NOTE — Telephone Encounter (Signed)
Returned call to patient who states that she thinks that her appointment scheduled for 5/20 was scheduled back in January of this year when she was having issues.  Patient states that she had cardiac testing that was negative and states that her symptoms have since resolved after changing some medications with her GI doctor. Patient would like to know if she really needs to keep her scheduled appointment or not. Patient reports she lives far away and would like to push out follow up depending on what Dr. Stanford Breed thinks. Advised patient I would forward message to Dr. Stanford Breed for him to review and advise.   Advised patient to call back to office with any issues, questions, or concerns. Patient verbalized understanding.

## 2021-01-02 ENCOUNTER — Ambulatory Visit: Payer: Medicare PPO | Admitting: Cardiology

## 2021-01-19 ENCOUNTER — Telehealth: Payer: Self-pay | Admitting: Internal Medicine

## 2021-01-19 DIAGNOSIS — E782 Mixed hyperlipidemia: Secondary | ICD-10-CM

## 2021-01-19 NOTE — Telephone Encounter (Signed)
   Patient requesting order to re- check lipids be entered prior to 7/1

## 2021-01-19 NOTE — Telephone Encounter (Signed)
ordered

## 2021-01-30 ENCOUNTER — Other Ambulatory Visit: Payer: Medicare PPO

## 2021-01-31 ENCOUNTER — Other Ambulatory Visit: Payer: Self-pay | Admitting: Cardiology

## 2021-01-31 DIAGNOSIS — I480 Paroxysmal atrial fibrillation: Secondary | ICD-10-CM

## 2021-02-09 ENCOUNTER — Ambulatory Visit (INDEPENDENT_AMBULATORY_CARE_PROVIDER_SITE_OTHER): Payer: Self-pay

## 2021-02-09 ENCOUNTER — Telehealth: Payer: Self-pay | Admitting: Podiatry

## 2021-02-09 ENCOUNTER — Other Ambulatory Visit: Payer: Self-pay

## 2021-02-09 DIAGNOSIS — B351 Tinea unguium: Secondary | ICD-10-CM

## 2021-02-09 NOTE — Progress Notes (Signed)
Patient presents today for the 4th laser treatment. Diagnosed with mycotic nail infection by Dr. Posey Pronto.   Toenail most affected left hallux, 2nd, 4th, 5th and right hallux, 3rd and 4th.  All other systems are negative.  Nails were filed thin. Laser therapy was administered to left hallux, 2nd, 4th, 5th and right hallux, 3rd and 4th  toenails and patient tolerated the treatment well. All safety precautions were in place.    Follow up in 6 weeks for laser # 5.

## 2021-02-09 NOTE — Telephone Encounter (Signed)
Called patient to see if she can come in sooner for her appt today. Patient could not commit at the time of the call but stated she will call if she can come in earlier if things change.

## 2021-02-13 ENCOUNTER — Other Ambulatory Visit (INDEPENDENT_AMBULATORY_CARE_PROVIDER_SITE_OTHER): Payer: Medicare PPO

## 2021-02-13 DIAGNOSIS — E782 Mixed hyperlipidemia: Secondary | ICD-10-CM | POA: Diagnosis not present

## 2021-02-13 LAB — COMPREHENSIVE METABOLIC PANEL WITH GFR
ALT: 17 U/L (ref 0–35)
AST: 22 U/L (ref 0–37)
Albumin: 4.6 g/dL (ref 3.5–5.2)
Alkaline Phosphatase: 68 U/L (ref 39–117)
BUN: 33 mg/dL — ABNORMAL HIGH (ref 6–23)
CO2: 24 meq/L (ref 19–32)
Calcium: 10.1 mg/dL (ref 8.4–10.5)
Chloride: 102 meq/L (ref 96–112)
Creatinine, Ser: 1.19 mg/dL (ref 0.40–1.20)
GFR: 43.25 mL/min — ABNORMAL LOW
Glucose, Bld: 81 mg/dL (ref 70–99)
Potassium: 4.3 meq/L (ref 3.5–5.1)
Sodium: 137 meq/L (ref 135–145)
Total Bilirubin: 0.9 mg/dL (ref 0.2–1.2)
Total Protein: 7.7 g/dL (ref 6.0–8.3)

## 2021-02-13 LAB — LIPID PANEL
Cholesterol: 204 mg/dL — ABNORMAL HIGH (ref 0–200)
HDL: 65.1 mg/dL
LDL Cholesterol: 118 mg/dL — ABNORMAL HIGH (ref 0–99)
NonHDL: 138.58
Total CHOL/HDL Ratio: 3
Triglycerides: 103 mg/dL (ref 0.0–149.0)
VLDL: 20.6 mg/dL (ref 0.0–40.0)

## 2021-02-16 ENCOUNTER — Other Ambulatory Visit: Payer: Self-pay | Admitting: Internal Medicine

## 2021-02-25 ENCOUNTER — Other Ambulatory Visit: Payer: Self-pay | Admitting: Cardiology

## 2021-03-13 ENCOUNTER — Encounter: Payer: Self-pay | Admitting: Internal Medicine

## 2021-03-13 ENCOUNTER — Ambulatory Visit: Payer: Medicare PPO | Admitting: Gastroenterology

## 2021-03-13 ENCOUNTER — Encounter: Payer: Self-pay | Admitting: Gastroenterology

## 2021-03-13 VITALS — BP 124/74 | HR 81 | Ht 66.0 in | Wt 166.0 lb

## 2021-03-13 DIAGNOSIS — Z86010 Personal history of colonic polyps: Secondary | ICD-10-CM

## 2021-03-13 DIAGNOSIS — Z1231 Encounter for screening mammogram for malignant neoplasm of breast: Secondary | ICD-10-CM | POA: Diagnosis not present

## 2021-03-13 DIAGNOSIS — K52831 Collagenous colitis: Secondary | ICD-10-CM | POA: Diagnosis not present

## 2021-03-13 NOTE — Progress Notes (Signed)
[name] [name]    UZ:[id number]    1940-11-12  Primary Care Physician:Burns, Claudina Lick, MD  Referring Physician: Binnie Rail, MD [place],  [place] [address]   Chief complaint:  Collagenous colitis  HPI:  80 year old very pleasant female with history of A. fib on Eliquis, family history of colon cancer here for follow-up for collagenous/microscopic colitis   Overall she is doing well with no recurrent episode of colitis.  Denies any change in bowel habits   Denies any abdominal pain, blood in stool or rectal bleeding. GI stool pathogen panel is negative for any GI pathogen or C. Difficile   No recent antibiotics, travel history, or change in medications   Colonoscopy October 24, 2018: 11 mm polyp removed from cecum, sigmoid diverticulosis and internal hemorrhoids EGD September 09, 2016: Pedunculated gastric polyp removed with hot snare, gastric biopsies negative for H. pylori otherwise unremarkable exam Colonoscopy September 04, 2015 2 sessile polyps in cecum removed with hot snare   Outpatient Encounter Medications as of 03/13/2021  Medication Sig   bisoprolol (ZEBETA) 10 MG tablet Take 1 tablet in the morning and 1/2 tablet in the evening   conjugated estrogens (PREMARIN) vaginal cream Premarin 0.625 mg/gram vaginal cream  Insert 0.5 applicatorsful twice a week by vaginal route.   dicyclomine (BENTYL) 10 MG capsule Take 1 capsule (10 mg total) by mouth 2 (two) times daily as needed for spasms.   diltiazem (CARDIZEM CD) 360 MG 24 hr capsule TAKE (1) CAPSULE BY MOUTH ONCE DAILY.   Doxylamine Succinate, Sleep, (UNISOM PO) Take by mouth at bedtime as needed.   ELIQUIS 5 MG TABS tablet TAKE 1 TABLET BY MOUTH TWICE DAILY   famotidine (PEPCID) 20 MG tablet Take 20 mg by mouth 2 (two) times daily.   furosemide (LASIX) 20 MG tablet Take 20 mg by mouth as needed.   spironolactone (ALDACTONE) 25 MG tablet TAKE 1 TABLET BY MOUTH ONCE DAILY.   [DISCONTINUED] furosemide  (LASIX) 20 MG tablet Take 1 tablet (20 mg total) by mouth daily. (Patient taking differently: Take 20 mg by mouth as needed.)   No facility-administered encounter medications on file as of 03/13/2021.    Allergies as of 03/13/2021 - Review Complete 03/13/2021  Allergen Reaction Noted   Augmentin [amoxicillin-pot clavulanate] Diarrhea 07/14/2015   Levaquin [levofloxacin in d5w]  10/08/2014   Polysporin [bacitracin-polymyxin b]  05/29/2015   Pravastatin Other (See Comments) 11/24/2015   Sulfa antibiotics  01/18/2020   Sulfonamide derivatives Hives    Erythromycin Swelling and Rash     Past Medical History:  Diagnosis Date   Allergic rhinitis    takes Benadryl at bedtime   Allergy    Anemia    Angio-edema    Arthritis    osteoarthritis   Atrial fibrillation ([place]) 06-10-2011   takes Diltiazem and Pradaxa daily   Cancer (HCC)    skin   Cataract    bilateral cataracts removed   Clotting disorder ([place])    on PRADAXA - chronic a fib   Collagenous colitis    recurrent, takes Budesonide daily as needed    Colon polyps 01/14/2010   Hyperplastic   Cough    states every day of her life and every chest xray is always clear   GERD (gastroesophageal reflux disease)    takes Omeprazole daily   History of poliomyelitis    Polio  age 3- no significant neuromuscular deficit  Hyperlipidemia    was on Pravastatin but joint pain;has been off for 2-66month ;takes CoQ10   Hypertension    takes Bisoprolol and Chlorthalidone daily   Potassium deficiency    takes KDUR daily    Past Surgical History:  Procedure Laterality Date   ADENOIDECTOMY     APPENDECTOMY     at age 80  BREAST BIOPSY  1947 & 10/07   Benign   CARDIOVERSION N/A 11/05/2016   Procedure: CARDIOVERSION;  Surgeon: TSueanne Margarita MD;  Location: [facility]  Service: Cardiovascular;  Laterality: N/A;   cataracts removed     COLONOSCOPY     ENDOVENOUS ABLATION SAPHENOUS VEIN W/ LASER  03-03-2011 left greater saphenous  vein    ENDOVENOUS ABLATION SAPHENOUS VEIN W/ LASER  01-06-2011  right greater saphenous vein   epidural infection     x 2    IR RADIOLOGIST EVAL & MGMT  03/07/2017   SKIN CANCER EXCISION     removal of skin cancer on left leg   TONSILLECTOMY     as a child   TONSILLECTOMY     TOTAL KNEE ARTHROPLASTY Right 09/18/2013   Procedure: TOTAL KNEE ARTHROPLASTY;  Surgeon: PGarald Balding MD;  Location: [facility]  Service: Orthopedics;  Laterality: Right;    Family History  Problem Relation Age of Onset   Stomach cancer Maternal Grandfather    Heart disease Paternal Grandfather    Colon cancer Son    Rectal cancer Son 478  Colon cancer Paternal Grandmother    Alcohol abuse Father    Heart disease Father    Stroke Father    Hypertension Father    Hypertension Sister    Other Sister        varicose veins   Peripheral vascular disease Mother    Other Mother        varicose veins   Esophageal cancer Neg Hx    Pancreatic cancer Neg Hx     Social History   Socioeconomic History   Marital status: Widowed    Spouse name: Not on file   Number of children: 2   Years of education: Not on file   Highest education level: Not on file  Occupational History   Occupation: retired    Comment: helps people with food, keeping power on, etc [place]   Smoking status: Former    Packs/day: 1.00    Years: 18.00    Pack years: 18.00    Types: Cigarettes    Quit date: 08/17/1987    Years since quitting: 33.5   Smokeless tobacco: Never   Tobacco comments:    quit smoking 1989  Vaping Use   Vaping Use: Never used  Substance and Sexual Activity   Alcohol use: Yes    Alcohol/week: 7.0 standard drinks    Types: 7 Glasses of wine per week    Comment: wine - 1-2 glasses a day   Drug use: No   Sexual activity: Not Currently    Partners: Male    Birth control/protection: Surgical  Other Topics Concern   Not on file  Social History Narrative   Retired -Games developer   2 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   Married     Former Smoker quit 1989    Alcohol use-yes -  glasses of wine/day      son works for [employer]- [place]   Social Determinants of Health  Financial Resource Strain: Low Risk    Difficulty of Paying Living Expenses: Not hard at all  Food Insecurity: No Food Insecurity   Worried About Charity fundraiser in the Last Year: Never true   Ran Out of Food in the Last Year: Never true  Transportation Needs: No Transportation Needs   Lack of Transportation (Medical): No   Lack of Transportation (Non-Medical): No  Physical Activity: Sufficiently Active   Days of Exercise per Week: 5 days   Minutes of Exercise per Session: 30 min  Stress: No Stress Concern Present   Feeling of Stress : Not at all  Social Connections: Moderately Integrated   Frequency of Communication with Friends and Family: More than three times a week   Frequency of Social Gatherings with Friends and Family: More than three times a week   Attends Religious Services: More than 4 times per year   Active Member of [employer] or Organizations: Yes   Attends Archivist Meetings: More than 4 times per year   Marital Status: Widowed  Intimate Partner Violence: Not on file      Review of systems: All other review of systems negative except as mentioned in the HPI.   Physical Exam: Vitals:   03/13/21 1045  BP: 124/74  Pulse: 81   Body mass index is 26.79 kg/m. Gen:      No acute distress Neuro: alert and oriented x 3 Psych: normal mood and affect  Data Reviewed:  Reviewed labs, radiology imaging, old records and pertinent past GI work up   Assessment and Plan/Recommendations:  80 year old very pleasant female with history of A. fib on Eliquis, family history of colon cancer, personal history of multiple adenomatous colon polyps and collagenous colitis   Chronic diarrhea: Collagenous colitis and IBS  Diarrhea improved after budesonide  course Overall symptoms are stable Continue with dietary changes  Family history of colon cancer and personal history of adenomatous colon polyps.  Discussed in detail with patient that after age 25 as per current guidelines continued surveillance is not recommended.  We can still do a diagnostic exam if needed especially if she experiences any significant change in her symptoms  Return as needed  The patient was provided an opportunity to ask questions and all were answered. The patient agreed with the plan and demonstrated an understanding of the instructions.  Damaris Hippo , MD    CC: Binnie Rail, MD

## 2021-03-13 NOTE — Patient Instructions (Signed)
Follow  up as needed  If you are age 80 or older, your body mass index should be between 23-30. Your Body mass index is 26.79 kg/m. If this is out of the aforementioned range listed, please consider follow up with your Primary Care Provider.  If you are age 41 or younger, your body mass index should be between 19-25. Your Body mass index is 26.79 kg/m. If this is out of the aformentioned range listed, please consider follow up with your Primary Care Provider.   __________________________________________________________  The [facility] providers would like to encourage you to use [facility] to communicate with providers for non-urgent requests or questions.  Due to long hold times on the telephone, sending your provider a message by [facility] may be a faster and more efficient way to get a response.  Please allow 48 business hours for a response.  Please remember that this is for non-urgent requests.    Thank you for choosing [facility]  Karleen Hampshire Nandigam,MD

## 2021-03-16 ENCOUNTER — Encounter: Payer: Self-pay | Admitting: Gastroenterology

## 2021-03-26 DIAGNOSIS — R922 Inconclusive mammogram: Secondary | ICD-10-CM | POA: Diagnosis not present

## 2021-03-26 DIAGNOSIS — R928 Other abnormal and inconclusive findings on diagnostic imaging of breast: Secondary | ICD-10-CM | POA: Diagnosis not present

## 2021-03-26 LAB — HM MAMMOGRAPHY

## 2021-03-27 ENCOUNTER — Other Ambulatory Visit: Payer: Self-pay

## 2021-03-27 ENCOUNTER — Ambulatory Visit (INDEPENDENT_AMBULATORY_CARE_PROVIDER_SITE_OTHER): Payer: Self-pay | Admitting: *Deleted

## 2021-03-27 DIAGNOSIS — B351 Tinea unguium: Secondary | ICD-10-CM

## 2021-03-27 NOTE — Progress Notes (Signed)
Patient presents today for the 4th laser treatment. Diagnosed with mycotic nail infection by Dr. Posey Pronto.   Toenail most affected LEFT- 1st, 2nd, 4th, 5th and RIGHT - 1st, 3rd and 4th. There is a ridge along the base of the 1st left showing new nail growth.  All other systems are negative.  Nails were filed thin. Laser therapy was administered to 1-5 toenails bilateral and patient tolerated the treatment well. All safety precautions were in place.    Follow up in 6 weeks for laser # 5.

## 2021-04-02 ENCOUNTER — Encounter: Payer: Self-pay | Admitting: Internal Medicine

## 2021-04-02 NOTE — Progress Notes (Unsigned)
Outside notes received. Information abstracted. Notes sent to scan.  

## 2021-04-09 ENCOUNTER — Other Ambulatory Visit: Payer: Self-pay | Admitting: Cardiology

## 2021-04-09 NOTE — Telephone Encounter (Signed)
Rx(s) sent to pharmacy electronically.  

## 2021-04-23 ENCOUNTER — Encounter: Payer: Self-pay | Admitting: Internal Medicine

## 2021-04-23 NOTE — Progress Notes (Signed)
Outside notes received. Information abstracted. Notes sent to scan.  

## 2021-04-24 DIAGNOSIS — B91 Sequelae of poliomyelitis: Secondary | ICD-10-CM | POA: Diagnosis not present

## 2021-04-24 DIAGNOSIS — Z7901 Long term (current) use of anticoagulants: Secondary | ICD-10-CM | POA: Diagnosis not present

## 2021-04-24 DIAGNOSIS — M8960 Osteopathy after poliomyelitis, unspecified site: Secondary | ICD-10-CM | POA: Diagnosis not present

## 2021-04-24 DIAGNOSIS — Z7951 Long term (current) use of inhaled steroids: Secondary | ICD-10-CM | POA: Diagnosis not present

## 2021-04-24 DIAGNOSIS — I4891 Unspecified atrial fibrillation: Secondary | ICD-10-CM | POA: Diagnosis not present

## 2021-04-24 DIAGNOSIS — K219 Gastro-esophageal reflux disease without esophagitis: Secondary | ICD-10-CM | POA: Diagnosis not present

## 2021-04-24 DIAGNOSIS — I10 Essential (primary) hypertension: Secondary | ICD-10-CM | POA: Diagnosis not present

## 2021-04-24 DIAGNOSIS — D6869 Other thrombophilia: Secondary | ICD-10-CM | POA: Diagnosis not present

## 2021-04-24 DIAGNOSIS — Z809 Family history of malignant neoplasm, unspecified: Secondary | ICD-10-CM | POA: Diagnosis not present

## 2021-05-06 ENCOUNTER — Other Ambulatory Visit: Payer: Self-pay | Admitting: Cardiology

## 2021-05-06 DIAGNOSIS — I480 Paroxysmal atrial fibrillation: Secondary | ICD-10-CM

## 2021-05-22 ENCOUNTER — Other Ambulatory Visit: Payer: Medicare PPO

## 2021-05-27 NOTE — Progress Notes (Signed)
HPI:FU AFib. Previously had cardioversion but did not hold sinus rhythm and now atrial fibrillation is managed with rate control and anticoagulation. Echocardiogram January 2022 showed normal LV function, mild asymmetric left ventricular hypertrophy, moderate left atrial enlargement, mild mitral regurgitation, mild to moderate tricuspid regurgitation, mild aortic insufficiency.  Nuclear study February 2022 showed apical thinning but no ischemia.  Monitor February 2022 showed atrial fibrillation with PVCs or aberrantly conducted beats rate upper normal to mildly increased.  Since I last saw her, she has fatigue.  Occasional dyspnea with more vigorous activities.  No orthopnea or PND.  Chronic pedal edema that is controlled with diuretic as needed.  No chest pain or syncope.  No bleeding.  Current Outpatient Medications  Medication Sig Dispense Refill   bisoprolol (ZEBETA) 10 MG tablet Take 1 tablet in the morning and 1/2 tablet in the evening 135 tablet 3   conjugated estrogens (PREMARIN) vaginal cream Premarin 0.625 mg/gram vaginal cream  Insert 0.5 applicatorsful twice a week by vaginal route.     dicyclomine (BENTYL) 10 MG capsule Take 1 capsule (10 mg total) by mouth 2 (two) times daily as needed for spasms. 90 capsule 1   diltiazem (CARDIZEM CD) 360 MG 24 hr capsule TAKE (1) CAPSULE BY MOUTH ONCE DAILY. 90 capsule 1   Doxylamine Succinate, Sleep, (UNISOM PO) Take by mouth at bedtime as needed.     ELIQUIS 5 MG TABS tablet TAKE 1 TABLET BY MOUTH TWICE DAILY 180 tablet 1   famotidine (PEPCID) 20 MG tablet Take 20 mg by mouth 2 (two) times daily.     furosemide (LASIX) 20 MG tablet Take 20 mg by mouth as needed.     spironolactone (ALDACTONE) 25 MG tablet TAKE 1 TABLET BY MOUTH ONCE DAILY. 90 tablet 2   No current facility-administered medications for this visit.     Past Medical History:  Diagnosis Date   Allergic rhinitis    takes Benadryl at bedtime   Allergy    Anemia     Angio-edema    Arthritis    osteoarthritis   Atrial fibrillation ([place]) 06-10-2011   takes Diltiazem and Pradaxa daily   Cancer [facility])    skin   Cataract    bilateral cataracts removed   Clotting disorder (HCC)    on PRADAXA - chronic a fib   Collagenous colitis    recurrent, takes Budesonide daily as needed    Colon polyps 01/14/2010   Hyperplastic   Cough    states every day of her life and every chest xray is always clear   GERD (gastroesophageal reflux disease)    takes Omeprazole daily   History of poliomyelitis    Polio  age 63- no significant neuromuscular deficit   Hyperlipidemia    was on Pravastatin but joint pain;has been off for 2-49months ;takes CoQ10   Hypertension    takes Bisoprolol and Chlorthalidone daily   Potassium deficiency    takes [facility] daily    Past Surgical History:  Procedure Laterality Date   ADENOIDECTOMY     APPENDECTOMY     at age 81   BREAST BIOPSY  1947 & 10/07   Benign   CARDIOVERSION N/A 11/05/2016   Procedure: CARDIOVERSION;  Surgeon: Sueanne Margarita, MD;  Location: [facility];  Service: Cardiovascular;  Laterality: N/A;   cataracts removed     COLONOSCOPY     ENDOVENOUS ABLATION SAPHENOUS VEIN W/ LASER  03-03-2011 left greater saphenous vein  ENDOVENOUS ABLATION SAPHENOUS VEIN W/ LASER  01-06-2011  right greater saphenous vein   epidural infection     x 2    IR RADIOLOGIST EVAL & MGMT  03/07/2017   SKIN CANCER EXCISION     removal of skin cancer on left leg   TONSILLECTOMY     as a child   TONSILLECTOMY     TOTAL KNEE ARTHROPLASTY Right 09/18/2013   Procedure: TOTAL KNEE ARTHROPLASTY;  Surgeon: Garald Balding, MD;  Location: [facility];  Service: Orthopedics;  Laterality: Right;    Social History   Socioeconomic History   Marital status: Widowed    Spouse name: Not on file   Number of children: 2   Years of education: Not on file   Highest education level: Not on file  Occupational History   Occupation: retired    Comment:  helps people with food, keeping power on, etc in [place] Use   Smoking status: Former    Packs/day: 1.00    Years: 18.00    Pack years: 18.00    Types: Cigarettes    Quit date: 08/17/1987    Years since quitting: 33.8   Smokeless tobacco: Never   Tobacco comments:    quit smoking 1989  Vaping Use   Vaping Use: Never used  Substance and Sexual Activity   Alcohol use: Yes    Alcohol/week: 7.0 standard drinks    Types: 7 Glasses of wine per week    Comment: wine - 1-2 glasses a day   Drug use: No   Sexual activity: Not Currently    Partners: Male    Birth control/protection: Surgical  Other Topics Concern   Not on file  Social History Narrative   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   Married     Former Smoker quit 1989    Alcohol use-yes -  glasses of wine/day      son works for [employer] - she flies to [place] to visit her grandchildren   Social Determinants of Radio broadcast assistant Strain: Low Risk    Difficulty of Paying Living Expenses: Not hard at [employer] Insecurity: No Food Insecurity   Worried About Charity fundraiser in the Last Year: Never true   Arboriculturist in the Last Year: Never true  Transportation Needs: No Transportation Needs   Lack of Transportation (Medical): No   Lack of Transportation (Non-Medical): No  Physical Activity: Sufficiently Active   Days of Exercise per Week: 5 days   Minutes of Exercise per Session: 30 min  Stress: No Stress Concern Present   Feeling of Stress : Not at all  Social Connections: Moderately Integrated   Frequency of Communication with Friends and Family: More than three times a week   Frequency of Social Gatherings with Friends and Family: More than three times a week   Attends Religious Services: More than 4 times per year   Active Member of [employer] or Organizations: Yes   Attends Archivist Meetings: More than 4 times per year   Marital Status: Widowed  Intimate Partner Violence:  Not on file    Family History  Problem Relation Age of Onset   Stomach cancer Maternal Grandfather    Heart disease Paternal Grandfather    Colon cancer Son    Rectal cancer Son 71   Colon cancer Paternal Grandmother    Alcohol abuse Father    Heart disease  Father    Stroke Father    Hypertension Father    Hypertension Sister    Other Sister        varicose veins   Peripheral vascular disease Mother    Other Mother        varicose veins   Esophageal cancer Neg Hx    Pancreatic cancer Neg Hx     ROS: Fatigue but no fevers or chills, productive cough, hemoptysis, dysphasia, odynophagia, melena, hematochezia, dysuria, hematuria, rash, seizure activity, orthopnea, PND, claudication. Remaining systems are negative.  Physical Exam: Well-developed well-nourished in no acute distress.  Skin is warm and dry.  HEENT is normal.  Neck is supple.  Chest is clear to auscultation with normal expansion.  Cardiovascular exam is irregular Abdominal exam nontender or distended. No masses palpated. Extremities show trace edema. neuro grossly intact  A/P  1 permanent atrial fibrillation-continue Cardizem and bisoprolol for rate control.  Continue apixaban.  2 hypertension-patient's blood pressure is controlled.  Continue present medical regimen.  3 hyperlipidemia-intolerant to statins.  4 pedal edema-continue diuretic at present dose.  Check potassium and renal function.  5 fatigue-we will check hemoglobin and TSH.  [name], MD

## 2021-05-31 ENCOUNTER — Other Ambulatory Visit: Payer: Self-pay | Admitting: Cardiology

## 2021-06-01 ENCOUNTER — Other Ambulatory Visit: Payer: Self-pay

## 2021-06-01 ENCOUNTER — Ambulatory Visit (INDEPENDENT_AMBULATORY_CARE_PROVIDER_SITE_OTHER): Payer: Self-pay

## 2021-06-01 DIAGNOSIS — B351 Tinea unguium: Secondary | ICD-10-CM

## 2021-06-01 NOTE — Patient Instructions (Signed)

## 2021-06-01 NOTE — Progress Notes (Signed)
Patient presents today for the 6th laser treatment. Diagnosed with mycotic nail infection by Dr. Posey Pronto.   Toenail most affected LEFT- 1st, 2nd, 4th, 5th and RIGHT - 1st, 3rd and 4th. There is a ridge along the base of the 1st left showing new nail growth.  All other systems are negative.  Nails were filed thin. Laser therapy was administered to 1-5 toenails bilateral and patient tolerated the treatment well. All safety precautions were in place.    Patient has completed the recommended laser treatments. He will follow up with Dr. Posey Pronto in 3 months to evaluate progress.

## 2021-06-02 ENCOUNTER — Ambulatory Visit: Payer: Medicare PPO | Admitting: Cardiology

## 2021-06-02 ENCOUNTER — Encounter: Payer: Self-pay | Admitting: Cardiology

## 2021-06-02 VITALS — BP 130/62 | HR 54 | Ht 66.0 in | Wt 161.6 lb

## 2021-06-02 DIAGNOSIS — I4821 Permanent atrial fibrillation: Secondary | ICD-10-CM | POA: Diagnosis not present

## 2021-06-02 DIAGNOSIS — R5383 Other fatigue: Secondary | ICD-10-CM | POA: Diagnosis not present

## 2021-06-02 DIAGNOSIS — E7800 Pure hypercholesterolemia, unspecified: Secondary | ICD-10-CM

## 2021-06-02 DIAGNOSIS — I10 Essential (primary) hypertension: Secondary | ICD-10-CM

## 2021-06-02 NOTE — Patient Instructions (Signed)

## 2021-06-03 LAB — CBC
Hematocrit: 40.1 % (ref 34.0–46.6)
Hemoglobin: 13.6 g/dL (ref 11.1–15.9)
MCH: 33.9 pg — ABNORMAL HIGH (ref 26.6–33.0)
MCHC: 33.9 g/dL (ref 31.5–35.7)
MCV: 100 fL — ABNORMAL HIGH (ref 79–97)
Platelets: 259 10*3/uL (ref 150–450)
RBC: 4.01 x10E6/uL (ref 3.77–5.28)
RDW: 12.2 % (ref 11.7–15.4)
WBC: 8.2 10*3/uL (ref 3.4–10.8)

## 2021-06-03 LAB — BASIC METABOLIC PANEL
BUN/Creatinine Ratio: 26 (ref 12–28)
BUN: 27 mg/dL (ref 8–27)
CO2: 24 mmol/L (ref 20–29)
Calcium: 9.7 mg/dL (ref 8.7–10.3)
Chloride: 98 mmol/L (ref 96–106)
Creatinine, Ser: 1.03 mg/dL — ABNORMAL HIGH (ref 0.57–1.00)
Glucose: 146 mg/dL — ABNORMAL HIGH (ref 70–99)
Potassium: 5.2 mmol/L (ref 3.5–5.2)
Sodium: 138 mmol/L (ref 134–144)
eGFR: 55 mL/min/{1.73_m2} — ABNORMAL LOW (ref >59)

## 2021-06-03 LAB — TSH: TSH: 1.42 u[IU]/mL (ref 0.450–4.500)

## 2021-06-05 ENCOUNTER — Encounter: Payer: Self-pay | Admitting: *Deleted

## 2021-07-20 DIAGNOSIS — D692 Other nonthrombocytopenic purpura: Secondary | ICD-10-CM | POA: Diagnosis not present

## 2021-07-20 DIAGNOSIS — D225 Melanocytic nevi of trunk: Secondary | ICD-10-CM | POA: Diagnosis not present

## 2021-07-20 DIAGNOSIS — Z85828 Personal history of other malignant neoplasm of skin: Secondary | ICD-10-CM | POA: Diagnosis not present

## 2021-07-20 DIAGNOSIS — L308 Other specified dermatitis: Secondary | ICD-10-CM | POA: Diagnosis not present

## 2021-08-13 ENCOUNTER — Other Ambulatory Visit: Payer: Self-pay | Admitting: Cardiology

## 2021-08-13 DIAGNOSIS — I480 Paroxysmal atrial fibrillation: Secondary | ICD-10-CM

## 2021-09-02 ENCOUNTER — Ambulatory Visit: Payer: Medicare PPO | Admitting: Podiatry

## 2021-09-02 ENCOUNTER — Other Ambulatory Visit: Payer: Self-pay

## 2021-09-02 DIAGNOSIS — B351 Tinea unguium: Secondary | ICD-10-CM | POA: Diagnosis not present

## 2021-09-02 NOTE — Progress Notes (Signed)
Subjective:  Patient ID: [name] [name], female    DOB: Jan 06, 1941,  MRN: [id number]  Chief Complaint  Patient presents with   Follow-up    Laser follow up. Pt states she is seeing improvement     81 y.o. female presents with the above complaint.  Patient presents with a follow-up from undergoing laser treatment x6.  Patient states that it has gotten a little bit better.  She wanted me to evaluate and see if it has helped.  It definitely seems like the nail now grows a little bit slower and less thicker and less discolored.  She states that there is still some left on the left first digit.  She would like to know if she can continue laser or if there is any other options.  She denies any other acute issues.  Review of Systems: Negative except as noted in the HPI. Denies N/V/F/Ch.  Past Medical History:  Diagnosis Date   Allergic rhinitis    takes Benadryl at bedtime   Allergy    Anemia    Angio-edema    Arthritis    osteoarthritis   Atrial fibrillation ([place]) 06-10-2011   takes Diltiazem and Pradaxa daily   Cancer [facility])    skin   Cataract    bilateral cataracts removed   Clotting disorder (HCC)    on PRADAXA - chronic a fib   Collagenous colitis    recurrent, takes Budesonide daily as needed    Colon polyps 01/14/2010   Hyperplastic   Cough    states every day of her life and every chest xray is always clear   GERD (gastroesophageal reflux disease)    takes Omeprazole daily   History of poliomyelitis    Polio  age 66- no significant neuromuscular deficit   Hyperlipidemia    was on Pravastatin but joint pain;has been off for 2-50months ;takes CoQ10   Hypertension    takes Bisoprolol and Chlorthalidone daily   Potassium deficiency    takes KDUR daily    Current Outpatient Medications:    bisoprolol (ZEBETA) 10 MG tablet, Take 1 tablet in the morning and 1/2 tablet in the evening, Disp: 135 tablet, Rfl: 3   conjugated estrogens (PREMARIN) vaginal cream, Premarin 0.625  mg/gram vaginal cream  Insert 0.5 applicatorsful twice a week by vaginal route., Disp: , Rfl:    dicyclomine (BENTYL) 10 MG capsule, Take 1 capsule (10 mg total) by mouth 2 (two) times daily as needed for spasms., Disp: 90 capsule, Rfl: 1   diltiazem (CARDIZEM CD) 360 MG 24 hr capsule, TAKE (1) CAPSULE BY MOUTH ONCE DAILY., Disp: 90 capsule, Rfl: 1   Doxylamine Succinate, Sleep, (UNISOM PO), Take by mouth at bedtime as needed., Disp: , Rfl:    ELIQUIS 5 MG TABS tablet, TAKE 1 TABLET BY MOUTH TWICE DAILY, Disp: 180 tablet, Rfl: 1   famotidine (PEPCID) 20 MG tablet, Take 20 mg by mouth 2 (two) times daily., Disp: , Rfl:    furosemide (LASIX) 20 MG tablet, Take 20 mg by mouth as needed., Disp: , Rfl:    spironolactone (ALDACTONE) 25 MG tablet, TAKE 1 TABLET BY MOUTH ONCE DAILY., Disp: 90 tablet, Rfl: 2  Social History   Tobacco Use  Smoking Status Former   Packs/day: 1.00   Years: 18.00   Pack years: 18.00   Types: Cigarettes   Quit date: 08/17/1987   Years since quitting: 34.0  Smokeless Tobacco Never  Tobacco Comments   quit smoking 1989  Allergies  Allergen Reactions   Augmentin [Amoxicillin-Pot Clavulanate] Diarrhea   Levaquin [Levofloxacin In D5w]     Severe join pain, muscle pain, and tendonitis   Polysporin [Bacitracin-Polymyxin B]     Blisters    Pravastatin Other (See Comments)    Muscle pain and weakness   Sulfa Antibiotics    Sulfonamide Derivatives Hives    Childhood reaction   Erythromycin Swelling and Rash   Objective:  There were no vitals filed for this visit. There is no height or weight on file to calculate BMI. Constitutional Well developed. Well nourished.  Vascular Dorsalis pedis pulses palpable bilaterally. Posterior tibial pulses palpable bilaterally. Capillary refill normal to all digits.  No cyanosis or clubbing noted. Pedal hair growth normal.  Neurologic Normal speech. Oriented to person, place, and time. Epicritic sensation to light touch  grossly present bilaterally.  Dermatologic Thickened elongated dystrophic toenails which are improving with mycotic nature x1 left hallux.  Orthopedic: Normal joint ROM without pain or crepitus bilaterally. No visible deformities. No bony tenderness.   Radiographs: None Assessment:   1. Dermatophytosis of nail   2. Nail fungus    Plan:  Patient was evaluated and treated and all questions answered.  Onychomycosis/nail fungus to bilateral foot -All questions and concerns were discussed with the patient in extensive detail. -I believe patient will benefit from more laser session as there have been some improvement especially in the lesser digits however there are still some fungus present in the hallux.  Patient states understanding and would like to proceed with continuing the laser treatment  No follow-ups on file.

## 2021-10-05 ENCOUNTER — Telehealth: Payer: Self-pay | Admitting: Gastroenterology

## 2021-10-05 MED ORDER — BUDESONIDE 3 MG PO CPEP: 3.0000 mg | ORAL_CAPSULE | Freq: Three times a day (TID) | ORAL | 1 refills | Status: DC

## 2021-10-05 NOTE — Telephone Encounter (Signed)
Inbound call from pt stating that it's 2 prescriptions at [facility] in [place] for Budesonide. Pt stated that she only needs the 3mg  and not the 9mg . Please advice. Thank you.

## 2021-10-05 NOTE — Telephone Encounter (Signed)
Called patient back, she wants 3mg  capsule sent to pharmacy 9mg  was sent in error. Sent today

## 2021-10-07 DIAGNOSIS — I10 Essential (primary) hypertension: Secondary | ICD-10-CM | POA: Diagnosis not present

## 2021-10-07 DIAGNOSIS — K219 Gastro-esophageal reflux disease without esophagitis: Secondary | ICD-10-CM | POA: Diagnosis not present

## 2021-10-07 DIAGNOSIS — I2510 Atherosclerotic heart disease of native coronary artery without angina pectoris: Secondary | ICD-10-CM | POA: Diagnosis not present

## 2021-10-07 DIAGNOSIS — I739 Peripheral vascular disease, unspecified: Secondary | ICD-10-CM | POA: Diagnosis not present

## 2021-10-07 DIAGNOSIS — I4891 Unspecified atrial fibrillation: Secondary | ICD-10-CM | POA: Diagnosis not present

## 2021-10-07 DIAGNOSIS — E669 Obesity, unspecified: Secondary | ICD-10-CM | POA: Diagnosis not present

## 2021-10-07 DIAGNOSIS — I700 Atherosclerosis of aorta: Secondary | ICD-10-CM | POA: Diagnosis not present

## 2021-10-07 DIAGNOSIS — D6869 Other thrombophilia: Secondary | ICD-10-CM | POA: Diagnosis not present

## 2021-10-07 DIAGNOSIS — K529 Noninfective gastroenteritis and colitis, unspecified: Secondary | ICD-10-CM | POA: Diagnosis not present

## 2021-10-08 ENCOUNTER — Telehealth: Payer: Self-pay

## 2021-10-08 NOTE — Telephone Encounter (Signed)
Initiated PA on cover my meds for budesonide 3 mg capsules 3 times daily.

## 2021-10-08 NOTE — Telephone Encounter (Signed)
Received fax from [facility] that budesonide capsules are approved and authorization is good until 08/15/22.

## 2021-10-09 ENCOUNTER — Other Ambulatory Visit: Payer: Medicare PPO

## 2021-10-11 ENCOUNTER — Other Ambulatory Visit: Payer: Self-pay | Admitting: Cardiology

## 2021-10-12 NOTE — Telephone Encounter (Signed)
Prescription refill request for Eliquis received. Indication:Afib Last office visit:10/22 Scr:1.0 Age: 81 Weight:73.3 kg  Prescription refilled

## 2021-11-10 ENCOUNTER — Other Ambulatory Visit: Payer: Self-pay | Admitting: Cardiology

## 2021-11-13 ENCOUNTER — Other Ambulatory Visit: Payer: Medicare PPO

## 2021-11-18 DIAGNOSIS — D692 Other nonthrombocytopenic purpura: Secondary | ICD-10-CM | POA: Diagnosis not present

## 2021-11-18 DIAGNOSIS — L821 Other seborrheic keratosis: Secondary | ICD-10-CM | POA: Diagnosis not present

## 2021-11-18 DIAGNOSIS — D1801 Hemangioma of skin and subcutaneous tissue: Secondary | ICD-10-CM | POA: Diagnosis not present

## 2021-11-18 DIAGNOSIS — Z85828 Personal history of other malignant neoplasm of skin: Secondary | ICD-10-CM | POA: Diagnosis not present

## 2021-11-18 DIAGNOSIS — L720 Epidermal cyst: Secondary | ICD-10-CM | POA: Diagnosis not present

## 2021-11-18 DIAGNOSIS — L814 Other melanin hyperpigmentation: Secondary | ICD-10-CM | POA: Diagnosis not present

## 2021-11-24 ENCOUNTER — Encounter: Payer: Self-pay | Admitting: Gastroenterology

## 2021-11-29 ENCOUNTER — Emergency Department (HOSPITAL_COMMUNITY)
Admission: EM | Admit: 2021-11-29 | Discharge: 2021-11-30 | Disposition: A | Discharge status: Home / Self Care | Payer: Medicare PPO | Attending: Emergency Medicine | Admitting: Emergency Medicine

## 2021-11-29 ENCOUNTER — Other Ambulatory Visit: Payer: Self-pay

## 2021-11-29 ENCOUNTER — Encounter (HOSPITAL_COMMUNITY): Payer: Self-pay | Admitting: Emergency Medicine

## 2021-11-29 DIAGNOSIS — R040 Epistaxis: Secondary | ICD-10-CM | POA: Diagnosis not present

## 2021-11-29 DIAGNOSIS — Z7901 Long term (current) use of anticoagulants: Secondary | ICD-10-CM | POA: Diagnosis not present

## 2021-11-29 DIAGNOSIS — R58 Hemorrhage, not elsewhere classified: Secondary | ICD-10-CM | POA: Diagnosis not present

## 2021-11-29 DIAGNOSIS — I4891 Unspecified atrial fibrillation: Secondary | ICD-10-CM | POA: Diagnosis not present

## 2021-11-29 DIAGNOSIS — Z87891 Personal history of nicotine dependence: Secondary | ICD-10-CM | POA: Diagnosis not present

## 2021-11-29 DIAGNOSIS — I10 Essential (primary) hypertension: Secondary | ICD-10-CM | POA: Diagnosis not present

## 2021-11-29 MED ORDER — OXYMETAZOLINE HCL 0.05 % NA SOLN
2.0000 | Freq: Once | NASAL | Status: AC
  Administered 2021-11-29: 2 via NASAL
  Filled 2021-11-29: qty 30

## 2021-11-29 MED ORDER — LIDOCAINE VISCOUS HCL 2 % MT SOLN
15.0000 mL | Freq: Once | OROMUCOSAL | Status: AC
  Administered 2021-11-29: 15 mL via OROMUCOSAL
  Filled 2021-11-29: qty 15

## 2021-11-29 MED ORDER — TRANEXAMIC ACID FOR EPISTAXIS
500.0000 mg | Freq: Once | TOPICAL | Status: AC
  Administered 2021-11-29: 500 mg via TOPICAL
  Filled 2021-11-29: qty 10

## 2021-11-29 NOTE — ED Triage Notes (Signed)
Pt reports nose bleed last night and used Afrin with relief. States she began bleeding again around 5 and it was much more. Pt on eliquis for chronic afib.  ?

## 2021-11-29 NOTE — ED Notes (Signed)
Pt reports nose bleeding again post silver nitrate. EDP notified.  ?

## 2021-11-29 NOTE — ED Provider Notes (Signed)
?[name] [name] ?Provider Note ? ? ?CSN: [id number] ?Arrival date & time: 11/29/21  1815 ? ?  ? ?History ? ?Chief Complaint  ?Patient presents with  ? Epistaxis  ? ? ?[name] [name] is a 81 y.o. female. ? ? ?Epistaxis ?Patient presents with epistaxis.  Right-sided.  However has gone up to the other side and down her throat also.  No lightheadedness or dizziness.  States she has had 3 bleeds over the last 24 hours.  Has had some bleeding in the past and Afrin has helped however this time Afrin did not help.  No lightheadedness or dizziness.  Is on Eliquis for atrial fibrillation ?  ? ?Home Medications ?Prior to Admission medications   ?Medication Sig Start Date End Date Taking? Authorizing Provider  ?apixaban (ELIQUIS) 5 MG TABS tablet TAKE 1 TABLET BY MOUTH TWICE DAILY 10/12/21   Lelon Perla, MD  ?bisoprolol (ZEBETA) 10 MG tablet TAKE 1 TABLET BY MOUTH IN THE MORNING AND 1/2 AT BEDTIME. 11/10/21   Lelon Perla, MD  ?budesonide (ENTOCORT EC) 3 MG 24 hr capsule Take 1 capsule (3 mg total) by mouth 3 (three) times daily. 10/05/21   Mauri Pole, MD  ?conjugated estrogens (PREMARIN) vaginal cream Premarin 0.625 mg/gram vaginal cream ? Insert 0.5 applicatorsful twice a week by vaginal route.    [provider]  ?dicyclomine (BENTYL) 10 MG capsule Take 1 capsule (10 mg total) by mouth 2 (two) times daily as needed for spasms. 10/15/20   Mauri Pole, MD  ?diltiazem (CARDIZEM CD) 360 MG 24 hr capsule TAKE (1) CAPSULE BY MOUTH ONCE DAILY. 08/13/21   Lelon Perla, MD  ?Doxylamine Succinate, Sleep, (UNISOM PO) Take by mouth at bedtime as needed.    [provider]  ?famotidine (PEPCID) 20 MG tablet Take 20 mg by mouth 2 (two) times daily.    [provider]  ?furosemide (LASIX) 20 MG tablet Take 20 mg by mouth as needed.    [provider]  ?spironolactone (ALDACTONE) 25 MG tablet TAKE 1 TABLET BY MOUTH ONCE DAILY. 06/02/21   Lelon Perla, MD  ?    ? ?Allergies    ?Augmentin [amoxicillin-pot clavulanate], Levaquin [levofloxacin in d5w], Polysporin [bacitracin-polymyxin b], Pravastatin, Sulfa antibiotics, Sulfonamide derivatives, and Erythromycin   ? ?Review of Systems   ?Review of Systems  ?HENT:  Positive for nosebleeds.   ? ?Physical Exam ?Updated Vital Signs ?BP 125/87   Pulse 78   Temp 97.7 ?F (36.5 ?C) (Oral)   Resp 19   Ht 5' 5.5" (1.664 m)   Wt 73.5 kg   SpO2 96%   BMI 26.55 kg/m?  ?Physical Exam ?Vitals and nursing note reviewed.  ?HENT:  ?   Nose:  ?   Comments: Dried blood medially in right nare. ?Cardiovascular:  ?   Rate and Rhythm: Normal rate.  ?Abdominal:  ?   Tenderness: There is no abdominal tenderness.  ?Musculoskeletal:     ?   General: No tenderness.  ?Skin: ?   General: Skin is warm.  ?   Capillary Refill: Capillary refill takes less than 2 seconds.  ?Neurological:  ?   Mental Status: She is alert and oriented to person, place, and time.  ? ? ?ED Results / Procedures / Treatments   ?Labs ?(all labs ordered are listed, but only abnormal results are displayed) ?Labs Reviewed - No data to display ? ?EKG ?None ? ?Radiology ?No results found. ? ?Procedures ?Procedures  ? ? ?  Medications Ordered in ED ?Medications  ?oxymetazoline (AFRIN) 0.05 % nasal spray 2 spray (2 sprays Each Nare Given 11/29/21 1836)  ?lidocaine (XYLOCAINE) 2 % viscous mouth solution 15 mL (15 mLs Mouth/Throat Given 11/29/21 1836)  ?tranexamic acid (CYKLOKAPRON) 1000 MG/10ML topical solution 500 mg (500 mg Topical Given 11/29/21 2152)  ? ? ?ED Course/ Medical Decision Making/ A&P ?  ?                        ?Medical Decision Making ?Risk ?OTC drugs. ?Prescription drug management. ? ? ?Patient presents with nosebleed.  Has had 3 episodes over the last day.  Is on anticoagulation for A-fib.  Feels good.  No lightheadedness or dizziness.  Has had bleeding out of the right nostril and somewhat on the left and also some posteriorly.  Unable to get bleeding controlled here.  I  have tried numerous modalities.  Started with some silver nitrate over a possible anterior source on the right.  Continued bleeding.  Then went to a 7.5 cm balloon.  Continued bleeding anterior after that.  Then added 8 cm Merocel with airway.  Continued bleeding anteriorly.  Later used 10 cm nasal sponge with tranexamic acid.  Continued bleeding although more posteriorly after that.  Discussed with Dr. Constance Holster.  Discussed either potential discharge and follow-up tomorrow versus transfer to [facility].  Discussed with patient and her son.  Since patient is still bleeding actively in particular posteriorly although it is slow we feel would benefit from transfer to [facility] to be seen by ENT potentially tonight.  Discussed with Dr. Christy Gentles in the ER also ? ? ? ? ? ? ? ?Final Clinical Impression(s) / ED Diagnoses ?Final diagnoses:  ?Epistaxis  ? ? ?Rx / DC Orders ?ED Discharge Orders   ? ? None  ? ?  ? ? ?  ?Davonna Belling, MD ?11/30/21 0000 ? ?

## 2021-11-30 ENCOUNTER — Telehealth: Payer: Self-pay | Admitting: Cardiology

## 2021-11-30 DIAGNOSIS — Z7901 Long term (current) use of anticoagulants: Secondary | ICD-10-CM | POA: Diagnosis not present

## 2021-11-30 DIAGNOSIS — I4891 Unspecified atrial fibrillation: Secondary | ICD-10-CM | POA: Diagnosis not present

## 2021-11-30 DIAGNOSIS — Z87891 Personal history of nicotine dependence: Secondary | ICD-10-CM | POA: Diagnosis not present

## 2021-11-30 DIAGNOSIS — R040 Epistaxis: Secondary | ICD-10-CM | POA: Diagnosis not present

## 2021-11-30 LAB — BASIC METABOLIC PANEL
Anion gap: 11 (ref 5–15)
BUN: 49 mg/dL — ABNORMAL HIGH (ref 8–23)
CO2: 21 mmol/L — ABNORMAL LOW (ref 22–32)
Calcium: 9.2 mg/dL (ref 8.9–10.3)
Chloride: 106 mmol/L (ref 98–111)
Creatinine, Ser: 0.96 mg/dL (ref 0.44–1.00)
GFR, Estimated: 59 mL/min — ABNORMAL LOW (ref >60)
Glucose, Bld: 133 mg/dL — ABNORMAL HIGH (ref 70–99)
Potassium: 4.4 mmol/L (ref 3.5–5.1)
Sodium: 138 mmol/L (ref 135–145)

## 2021-11-30 LAB — CBC
HCT: 37.9 % (ref 36.0–46.0)
Hemoglobin: 12.4 g/dL (ref 12.0–15.0)
MCH: 34.2 pg — ABNORMAL HIGH (ref 26.0–34.0)
MCHC: 32.7 g/dL (ref 30.0–36.0)
MCV: 104.4 fL — ABNORMAL HIGH (ref 80.0–100.0)
Platelets: 226 10*3/uL (ref 150–400)
RBC: 3.63 MIL/uL — ABNORMAL LOW (ref 3.87–5.11)
RDW: 13.2 % (ref 11.5–15.5)
WBC: 10.4 10*3/uL (ref 4.0–10.5)
nRBC: 0 % (ref 0.0–0.2)

## 2021-11-30 LAB — PROTIME-INR
INR: 1.4 — ABNORMAL HIGH (ref 0.8–1.2)
Prothrombin Time: 17.2 seconds — ABNORMAL HIGH (ref 11.4–15.2)

## 2021-11-30 MED ORDER — ACETAMINOPHEN 325 MG PO TABS
650.0000 mg | ORAL_TABLET | Freq: Once | ORAL | Status: AC
  Administered 2021-11-30: 650 mg via ORAL
  Filled 2021-11-30: qty 2

## 2021-11-30 NOTE — ED Notes (Signed)
ENT at bedside

## 2021-11-30 NOTE — ED Provider Notes (Signed)
Patient arrives management hospital for evaluation of likely posterior epistaxis.  She is awake and alert with packing in place in the right nare.  She still has evidence of posterior bleeding.  Discussed with Dr. Constance Holster who will see the patient in the emergency department ?  ?Ripley Fraise, MD ?11/30/21 0122 ? ?

## 2021-11-30 NOTE — Telephone Encounter (Signed)
Spoke with pt, okay given for her to hold eliquis for 2 days to get the bleeding under control. She is aware of the increased risk of stroke off the blood thinner. ?

## 2021-11-30 NOTE — Telephone Encounter (Signed)
Pt states she had to go to the ER due to a uncontrollable nose bleed. Pt wants to know if she can miss 2 days of not taking Eliquis to see if that will help with the nose bleed. Please advise ?

## 2021-11-30 NOTE — ED Provider Notes (Signed)
Labs reveal mild hyperglycemia but overall unremarkable ?Patient has been seen by Dr. Constance Holster with otolaryngology ?He would like to monitor patient until around 7:30 in the morning if no further bleeding she can be discharged ?  ?[name] Fraise, MD ?11/30/21 0221 ? ?

## 2021-11-30 NOTE — ED Notes (Signed)
Report given to [facility] at [facility] ED.  ?

## 2021-11-30 NOTE — Progress Notes (Signed)
Patient ID: [name] [name], female   DOB: 1941-03-24, 81 y.o.   MRN: [id number] ?Reevaluation. ? ?Overnight there has been just occasional small amounts of bleeding posteriorly.  There is no active bleeding right now.  Her oropharynx is clear.  The packing itself is still quite.  She is stable at this point and can be discharged home.  She will follow-up with [place] in 5 days for packing removal. ?

## 2021-11-30 NOTE — Consult Note (Signed)
?[name] [name] is an 81 y.o. female.   ?Chief Complaint: Epistaxis ?HPI: She had a couple minor nosebleeds in the past but starting yesterday afternoon has had severe nosebleed from the right side.  She spent hours in [facility] having multiple treatments but bleeding was not able to be controlled.  She was transferred down here for further care.  She is on anticoagulation therapy for atrial fibrillation. ? ?Past Medical History:  ?Diagnosis Date  ? Allergic rhinitis   ? takes Benadryl at bedtime  ? Allergy   ? Anemia   ? Angio-edema   ? Arthritis   ? osteoarthritis  ? Atrial fibrillation ([place]) 06-10-2011  ? takes Diltiazem and Pradaxa daily  ? Cancer [facility])   ? skin  ? Cataract   ? bilateral cataracts removed  ? Clotting disorder ([place])   ? on PRADAXA - chronic a fib  ? Collagenous colitis   ? recurrent, takes Budesonide daily as needed   ? Colon polyps 01/14/2010  ? Hyperplastic  ? Cough   ? states every day of her life and every chest xray is always clear  ? GERD (gastroesophageal reflux disease)   ? takes Omeprazole daily  ? History of poliomyelitis   ? Polio  age 41- no significant neuromuscular deficit  ? Hyperlipidemia   ? was on Pravastatin but joint pain;has been off for 2-13month ;takes CoQ10  ? Hypertension   ? takes Bisoprolol and Chlorthalidone daily  ? Potassium deficiency   ? [facility]  ? ? ?Past Surgical History:  ?Procedure Laterality Date  ? ADENOIDECTOMY    ? APPENDECTOMY    ? at age 81 ? BREAST BIOPSY  1947 & 10/07  ? Benign  ? CARDIOVERSION N/A 11/05/2016  ? Procedure: CARDIOVERSION;  Surgeon: TSueanne Margarita MD;  [facility];  Service: Cardiovascular;  Laterality: N/A;  ? cataracts removed    ? COLONOSCOPY    ? ENDOVENOUS ABLATION SAPHENOUS VEIN W/ LASER  03-03-2011 left greater saphenous vein   ? ENDOVENOUS ABLATION SAPHENOUS VEIN W/ LASER  01-06-2011  right greater saphenous vein  ? epidural infection    ? x 2   ? IR RADIOLOGIST EVAL & MGMT  03/07/2017  ? SKIN CANCER  EXCISION    ? removal of skin cancer on left leg  ? TONSILLECTOMY    ? as a child  ? TONSILLECTOMY    ? TOTAL KNEE ARTHROPLASTY Right 09/18/2013  ? Procedure: TOTAL KNEE ARTHROPLASTY;  Surgeon: PGarald Balding MD;  [facility]  Service: Orthopedics;  Laterality: Right;  ? ? ?Family History  ?Problem Relation Age of Onset  ? Stomach cancer Maternal Grandfather   ? Heart disease Paternal Grandfather   ? Colon cancer Son   ? Rectal cancer Son 449 ? Colon cancer Paternal Grandmother   ? Alcohol abuse Father   ? Heart disease Father   ? Stroke Father   ? Hypertension Father   ? Hypertension Sister   ? Other Sister   ?     varicose veins  ? Peripheral vascular disease Mother   ? Other Mother   ?     varicose veins  ? Esophageal cancer Neg Hx   ? Pancreatic cancer Neg Hx   ? ?Social History:  reports that she quit smoking about 34 years ago. Her smoking use included cigarettes. She has a 18.00 pack-year smoking history. She has never used smokeless tobacco. She reports current alcohol use of  about 7.0 standard drinks per week. She reports that she does not use drugs. ? ?Allergies:  ?Allergies  ?Allergen Reactions  ? Augmentin [Amoxicillin-Pot Clavulanate] Diarrhea  ? Levaquin [Levofloxacin In D5w]   ?  Severe join pain, muscle pain, and tendonitis  ? Polysporin [Bacitracin-Polymyxin B]   ?  Blisters   ? Pravastatin Other (See Comments)  ?  Muscle pain and weakness  ? Sulfa Antibiotics   ? Sulfonamide Derivatives Hives  ?  Childhood reaction  ? Erythromycin Swelling and Rash  ? ? ?(Not in a hospital admission) ? ? ?Results for orders placed or performed during the hospital encounter of 11/29/21 (from the past 48 hour(s))  ?Basic metabolic panel     Status: Abnormal  ? Collection Time: 11/30/21  1:31 AM  ?Result Value Ref Range  ? Sodium 138 135 - 145 mmol/L  ? Potassium 4.4 3.5 - 5.1 mmol/L  ? Chloride 106 98 - 111 mmol/L  ? CO2 21 (L) 22 - 32 mmol/L  ? Glucose, Bld 133 (H) 70 - 99 mg/dL  ?  Comment: Glucose reference  range applies only to samples taken after fasting for at least 8 hours.  ? BUN 49 (H) 8 - 23 mg/dL  ? Creatinine, Ser 0.96 0.44 - 1.00 mg/dL  ? Calcium 9.2 8.9 - 10.3 mg/dL  ? GFR, Estimated 59 (L) >60 mL/min  ?  Comment: (NOTE) ?Calculated using the CKD-EPI Creatinine Equation (2021) ?  ? Anion gap 11 5 - 15  ?  Comment: Performed at [facility], [place] [address] [place], [place] [address]  ?CBC     Status: Abnormal  ? Collection Time: 11/30/21  1:31 AM  ?Result Value Ref Range  ? WBC 10.4 4.0 - 10.5 K/uL  ? RBC 3.63 (L) 3.87 - 5.11 MIL/uL  ? Hemoglobin 12.4 12.0 - 15.0 g/dL  ? HCT 37.9 36.0 - 46.0 %  ? MCV 104.4 (H) 80.0 - 100.0 fL  ? MCH 34.2 (H) 26.0 - 34.0 pg  ? MCHC 32.7 30.0 - 36.0 g/dL  ? RDW 13.2 11.5 - 15.5 %  ? Platelets 226 150 - 400 K/uL  ? nRBC 0.0 0.0 - 0.2 %  ?  Comment: Performed at [facility], [place] [address], [place], [place] [address]  ?Protime-INR     Status: Abnormal  ? Collection Time: 11/30/21  1:31 AM  ?Result Value Ref Range  ? Prothrombin Time 17.2 (H) 11.4 - 15.2 seconds  ? INR 1.4 (H) 0.8 - 1.2  ?  Comment: (NOTE) ?INR goal varies based on device and disease states. ?Performed at [facility], [place] [address] [place], [place] ?[address] ?  ? ?No results found. ? ?ROS: otherwise negative ? ?Blood pressure 137/78, pulse 85, temperature (!) 97.3 ?F (36.3 ?C), temperature source Tympanic, resp. rate 14, height 5' 5.5" (1.664 m), weight 73.5 kg, SpO2 96 %. ? ?PHYSICAL EXAM: ?Overall appearance:  Healthy appearing, in no distress ?Head:  Normocephalic, atraumatic. ?Ears: External ears look healthy. ?Nose: External nose is healthy in appearance.  Right nasal packing in place, no active anterior bleeding. ?Oral Cavity/pharynx:  There are no mucosal lesions or masses identified.  There is mild posterior bleeding down the pharynx. ?Neuro:  No identifiable neurologic deficits. ?Neck: No palpable neck masses. ? ?Studies Reviewed: none ? ?Procedure: ? ?The right nasal packing was  removed.  Topical Afrin/Xylocaine was sprayed into the right nasal cavity and then placed with cotton pledgets.  Xylocaine  with epinephrine was then infiltrated into the septum and the middle and inferior turbinates using a 25-gauge spinal needle.  Fiberoptic endoscopy was performed.  An active bleeding site was never identified.  When the packing was removed there was blood and clot in the superior midportion of the packing suggesting that this may have been bleeding from high up along the septum or middle turbinate.  A piece of Surgicel was packed into that area.  A 10 cm Merocel SlimLine was then placed into the nasal cavity and inflated with local anesthetic solution.  She tolerated this well. ? ?Assessment/Plan ?Right-sided epistaxis.  Bleeding site not identified.  Surgicel packed into the upper nasal cavity and then a long Merocel pack was packed below.  Recommend we keep her in for observation for the rest of the morning and will discharge home later today if no further bleeding.  If there is any further bleeding then we will address that. ? ?I48.91 ?Z79.01 ?R04.0 ? ? ?Izora Gala ?11/30/2021, 2:11 AM ? ? ? ?

## 2021-11-30 NOTE — ED Provider Notes (Signed)
Signed out to Dr. Vanita Panda at shift change ?  ?Ripley Fraise, MD ?11/30/21 0701 ? ?

## 2021-12-04 DIAGNOSIS — R040 Epistaxis: Secondary | ICD-10-CM | POA: Diagnosis not present

## 2021-12-04 DIAGNOSIS — Z7901 Long term (current) use of anticoagulants: Secondary | ICD-10-CM | POA: Diagnosis not present

## 2021-12-04 DIAGNOSIS — J342 Deviated nasal septum: Secondary | ICD-10-CM | POA: Diagnosis not present

## 2021-12-04 DIAGNOSIS — I4891 Unspecified atrial fibrillation: Secondary | ICD-10-CM | POA: Diagnosis not present

## 2021-12-10 NOTE — Progress Notes (Signed)
? ? ?Subjective:  ? ? Patient ID: [name] [name], female    DOB: 10-28-1940, 81 y.o.   MRN: [id number] ? ? ?This visit occurred during the SARS-CoV-2 public health emergency.  Safety protocols were in place, including screening questions prior to the visit, additional usage of staff PPE, and extensive cleaning of exam room while observing appropriate contact time as indicated for disinfecting solutions. ? ? ? ?HPI ?Halen is here for  ?Chief Complaint  ?Patient presents with  ? Annual Exam  ? ? ?Feels like she has a sinus infection - pressure in sinuses, top teeth ache.  She has had sinus infection in the past and that is how this feels. ? ? ?Has diff walking distances - need walker to walk long distances.  She has significant scoliosis in her back and as a result chronic back pain.  She really wants to walk more, but is not able to.  Recently she tried a friend's walker and that really seemed to help.  She would ideally like something with a seat just in case she does need to rest. ? ? ? ?Medications and allergies reviewed with patient and updated if appropriate. ? ?Current Outpatient Medications on File Prior to Visit  ?Medication Sig Dispense Refill  ? acetaminophen (TYLENOL) 500 MG tablet Take 1,000 mg by mouth every 6 (six) hours as needed for moderate pain or headache.    ? apixaban (ELIQUIS) 5 MG TABS tablet TAKE 1 TABLET BY MOUTH TWICE DAILY (Patient taking differently: Take 5 mg by mouth 2 (two) times daily.) 180 tablet 1  ? bisoprolol (ZEBETA) 10 MG tablet TAKE 1 TABLET BY MOUTH IN THE MORNING AND 1/2 AT BEDTIME. (Patient taking differently: Take 5-10 mg by mouth See admin instructions. 10 mg in the morning ?5 mg at bedtime) 135 tablet 0  ? budesonide (ENTOCORT EC) 3 MG 24 hr capsule Take 1 capsule (3 mg total) by mouth 3 (three) times daily. (Patient taking differently: Take 3 mg by mouth daily as needed (flare up).) 90 capsule 1  ? dicyclomine (BENTYL) 10 MG capsule Take 1 capsule (10 mg total) by mouth 2  (two) times daily as needed for spasms. 90 capsule 1  ? diltiazem (CARDIZEM CD) 360 MG 24 hr capsule TAKE (1) CAPSULE BY MOUTH ONCE DAILY. (Patient taking differently: Take 360 mg by mouth daily.) 90 capsule 1  ? Doxylamine Succinate, Sleep, (UNISOM PO) Take 1 tablet by mouth at bedtime.    ? famotidine (PEPCID) 20 MG tablet Take 20 mg by mouth 2 (two) times daily.    ? furosemide (LASIX) 20 MG tablet Take 20 mg by mouth daily as needed for fluid or edema.    ? sodium fluoride (FLUORISHIELD) 1.1 % GEL dental gel Take 1 application. by mouth once a week. No set day    ? spironolactone (ALDACTONE) 25 MG tablet TAKE 1 TABLET BY MOUTH ONCE DAILY. (Patient taking differently: Take 25 mg by mouth daily.) 90 tablet 2  ? ?No current facility-administered medications on file prior to visit.  ? ? ?Review of Systems  ?Constitutional:  Negative for chills and fever.  ?HENT:  Positive for postnasal drip.   ?Eyes:  Negative for visual disturbance.  ?Respiratory:  Negative for cough, shortness of breath and wheezing.   ?Cardiovascular:  Positive for leg swelling (lasix prn). Negative for chest pain and palpitations.  ?Gastrointestinal:  Positive for diarrhea (not now - takes steroid as needed). Negative for abdominal pain, blood in stool, constipation and  nausea.  ?     Occ gerd  ?Genitourinary:  Positive for difficulty urinating (related to prolapse). Negative for dysuria.  ?Musculoskeletal:  Positive for arthralgias and back pain.  ?Neurological:  Positive for light-headedness (mild since nose bleed). Negative for headaches.  ?Psychiatric/Behavioral:  Negative for dysphoric mood. The patient is not nervous/anxious.   ? ?   ?Objective:  ? ?Vitals:  ? 12/11/21 1419  ?BP: 102/62  ?Pulse: 72  ?Temp: 98.6 ?F (37 ?C)  ?SpO2: 98%  ? ?Filed Weights  ? 12/11/21 1419  ?Weight: 161 lb (73 kg)  ? ?Body mass index is 26.38 kg/m?. ? ?BP Readings from Last 3 Encounters:  ?12/11/21 102/62  ?11/30/21 131/83  ?06/02/21 130/62  ? ? ?Wt Readings  from Last 3 Encounters:  ?12/11/21 161 lb (73 kg)  ?11/29/21 162 lb (73.5 kg)  ?06/02/21 161 lb 9.6 oz (73.3 kg)  ? ? ? ?  12/11/2021  ?  2:28 PM 11/19/2020  ? 12:46 PM 01/19/2019  ?  1:37 PM 01/04/2018  ? 10:44 AM 01/03/2017  ?  2:12 PM  ?Depression screen PHQ 2/9  ?Decreased Interest 0 0 0 0 0  ?Down, Depressed, Hopeless 0 0 1 1 0  ?PHQ - 2 Score 0 0 1 1 0  ?Altered sleeping  0  0   ?Tired, decreased energy  0  1   ?Change in appetite  0  0   ?Feeling bad or failure about yourself   0  0   ?Trouble concentrating  1  0   ?Moving slowly or fidgety/restless  0  0   ?Suicidal thoughts  0  0   ?PHQ-9 Score  1  2   ?Difficult doing work/chores  Not difficult at all  Not difficult at all   ? ? ? ? ?  11/19/2020  ? 12:47 PM  ?GAD 7 : Generalized Anxiety Score  ?Nervous, Anxious, on Edge 0  ?Control/stop worrying 0  ?Worry too much - different things 0  ?Trouble relaxing 0  ?Restless 0  ?Easily annoyed or irritable 1  ?Afraid - awful might happen 0  ?Total GAD 7 Score 1  ?Anxiety Difficulty Not difficult at all  ? ? ? ? ?  ?Physical Exam ?Constitutional: She appears well-developed and well-nourished. No distress.  ?HENT:  ?Head: Normocephalic and atraumatic.  ?Right Ear: External ear normal. Normal ear canal and TM ?Left Ear: External ear normal.  Normal ear canal and TM ?Mouth/Throat: Oropharynx is clear and moist.  ?Eyes: Conjunctivae and EOM are normal.  ?Neck: Neck supple. No tracheal deviation present. No thyromegaly present.  ?No carotid bruit  ?Cardiovascular: Normal rate, regular rhythm and normal heart sounds.   ?No murmur heard.  No edema. ?Pulmonary/Chest: Effort normal and breath sounds normal. No respiratory distress. She has no wheezes. She has no rales.  ?Breast: deferred   ?Abdominal: Soft. She exhibits no distension. There is no tenderness.  ?Lymphadenopathy: She has no cervical adenopathy. ?Musculoskeletal: Severe scoliosis ?Skin: Skin is warm and dry. She is not diaphoretic.  ?Psychiatric: She has a normal mood  and affect. Her behavior is normal.  ? ? ? ?Lab Results  ?Component Value Date  ? WBC 10.4 11/30/2021  ? HGB 12.4 11/30/2021  ? HCT 37.9 11/30/2021  ? PLT 226 11/30/2021  ? GLUCOSE 133 (H) 11/30/2021  ? CHOL 204 (H) 02/13/2021  ? TRIG 103.0 02/13/2021  ? HDL 65.10 02/13/2021  ? LDLDIRECT 128.6 12/09/2010  ? LDLCALC 118 (H) 02/13/2021  ?  ALT 17 02/13/2021  ? AST 22 02/13/2021  ? NA 138 11/30/2021  ? K 4.4 11/30/2021  ? CL 106 11/30/2021  ? CREATININE 0.96 11/30/2021  ? BUN 49 (H) 11/30/2021  ? CO2 21 (L) 11/30/2021  ? TSH 1.420 06/02/2021  ? INR 1.4 (H) 11/30/2021  ? HGBA1C 5.3 11/19/2020  ? ? ? ? ?   ?Assessment & Plan:  ? ?Physical exam: ?Screening blood work  ordered ?Exercise  can not walk - back gives out - scoliosis,  ?Weight  good for age ?Substance abuse  none ? ? ?Reviewed recommended immunizations. ? ? ?Health Maintenance  ?Topic Date Due  ? Zoster Vaccines- Shingrix (1 of 2) Never done  ? TETANUS/TDAP  09/20/2019  ? COLONOSCOPY (Pts 45-74yr Insurance coverage will need to be confirmed)  10/23/2021  ? COVID-19 Vaccine (4 - Booster for Moderna series) 12/27/2021 (Originally 08/07/2020)  ? DEXA SCAN  12/12/2022 (Originally 10/25/2005)  ? INFLUENZA VACCINE  03/16/2022  ? Pneumonia Vaccine 81 Years old  Completed  ? HPV VACCINES  Aged Out  ?  ? ? ? ? ? ? ?See Problem List for Assessment and Plan of chronic medical problems. ? ? ? ? ?

## 2021-12-10 NOTE — Patient Instructions (Addendum)
? ? ? ?Blood work was ordered.   ? ? ?Medications changes include :   doxycycline 100 mg twice daily ? ? ?Your prescription(s) have been sent to your pharmacy.  ? ? ? ?Return in about 1 year (around 12/12/2022) for Physical Exam. ? ? ? ?Health Maintenance, Female ?Adopting a healthy lifestyle and getting preventive care are important in promoting health and wellness. Ask your health care provider about: ?The right schedule for you to have regular tests and exams. ?Things you can do on your own to prevent diseases and keep yourself healthy. ?What should I know about diet, weight, and exercise? ?Eat a healthy diet ? ?Eat a diet that includes plenty of vegetables, fruits, low-fat dairy products, and lean protein. ?Do not eat a lot of foods that are high in solid fats, added sugars, or sodium. ?Maintain a healthy weight ?Body mass index (BMI) is used to identify weight problems. It estimates body fat based on height and weight. Your health care provider can help determine your BMI and help you achieve or maintain a healthy weight. ?Get regular exercise ?Get regular exercise. This is one of the most important things you can do for your health. Most adults should: ?Exercise for at least 150 minutes each week. The exercise should increase your heart rate and make you sweat (moderate-intensity exercise). ?Do strengthening exercises at least twice a week. This is in addition to the moderate-intensity exercise. ?Spend less time sitting. Even light physical activity can be beneficial. ?Watch cholesterol and blood lipids ?Have your blood tested for lipids and cholesterol at 81 years of age, then have this test every 5 years. ?Have your cholesterol levels checked more often if: ?Your lipid or cholesterol levels are high. ?You are older than 81 years of age. ?You are at high risk for heart disease. ?What should I know about cancer screening? ?Depending on your health history and family history, you may need to have cancer  screening at various ages. This may include screening for: ?Breast cancer. ?Cervical cancer. ?Colorectal cancer. ?Skin cancer. ?Lung cancer. ?What should I know about heart disease, diabetes, and high blood pressure? ?Blood pressure and heart disease ?High blood pressure causes heart disease and increases the risk of stroke. This is more likely to develop in people who have high blood pressure readings or are overweight. ?Have your blood pressure checked: ?Every 3-5 years if you are 105-78 years of age. ?Every year if you are 38 years old or older. ?Diabetes ?Have regular diabetes screenings. This checks your fasting blood sugar level. Have the screening done: ?Once every three years after age 75 if you are at a normal weight and have a low risk for diabetes. ?More often and at a younger age if you are overweight or have a high risk for diabetes. ?What should I know about preventing infection? ?Hepatitis B ?If you have a higher risk for hepatitis B, you should be screened for this virus. Talk with your health care provider to find out if you are at risk for hepatitis B infection. ?Hepatitis C ?Testing is recommended for: ?Everyone born from [birth year] through 1965. ?Anyone with known risk factors for hepatitis C. ?Sexually transmitted infections (STIs) ?Get screened for STIs, including gonorrhea and chlamydia, if: ?You are sexually active and are younger than 81 years of age. ?You are older than 81 years of age and your health care provider tells you that you are at risk for this type of infection. ?Your sexual activity has changed since you were  last screened, and you are at increased risk for chlamydia or gonorrhea. Ask your health care provider if you are at risk. ?Ask your health care provider about whether you are at high risk for HIV. Your health care provider may recommend a prescription medicine to help prevent HIV infection. If you choose to take medicine to prevent HIV, you should first get tested for HIV. You  should then be tested every 3 months for as long as you are taking the medicine. ?Pregnancy ?If you are about to stop having your period (premenopausal) and you may become pregnant, seek counseling before you get pregnant. ?Take 400 to 800 micrograms (mcg) of folic acid every day if you become pregnant. ?Ask for birth control (contraception) if you want to prevent pregnancy. ?Osteoporosis and menopause ?Osteoporosis is a disease in which the bones lose minerals and strength with aging. This can result in bone fractures. If you are 95 years old or older, or if you are at risk for osteoporosis and fractures, ask your health care provider if you should: ?Be screened for bone loss. ?Take a calcium or vitamin D supplement to lower your risk of fractures. ?Be given hormone replacement therapy (HRT) to treat symptoms of menopause. ?Follow these instructions at home: ?Alcohol use ?Do not drink alcohol if: ?Your health care provider tells you not to drink. ?You are pregnant, may be pregnant, or are planning to become pregnant. ?If you drink alcohol: ?Limit how much you have to: ?0-1 drink a day. ?Know how much alcohol is in your drink. In the [place] one drink equals one 12 oz bottle of beer (355 mL), one 5 oz glass of wine (148 mL), or one 1? oz glass of hard liquor (44 mL). ?Lifestyle ?Do not use any products that contain nicotine or tobacco. These products include cigarettes, chewing tobacco, and vaping devices, such as e-cigarettes. If you need help quitting, ask your health care provider. ?Do not use street drugs. ?Do not share needles. ?Ask your health care provider for help if you need support or information about quitting drugs. ?General instructions ?Schedule regular health, dental, and eye exams. ?Stay current with your vaccines. ?Tell your health care provider if: ?You often feel depressed. ?You have ever been abused or do not feel safe at home. ?Summary ?Adopting a healthy lifestyle and getting preventive care are  important in promoting health and wellness. ?Follow your health care provider's instructions about healthy diet, exercising, and getting tested or screened for diseases. ?Follow your health care provider's instructions on monitoring your cholesterol and blood pressure. ?This information is not intended to replace advice given to you by your health care provider. Make sure you discuss any questions you have with your health care provider. ?Document Revised: 12/22/2020 Document Reviewed: 12/22/2020 ?Elsevier Patient Education ? [place]. ? ?

## 2021-12-11 ENCOUNTER — Ambulatory Visit (INDEPENDENT_AMBULATORY_CARE_PROVIDER_SITE_OTHER): Payer: Medicare PPO | Admitting: Internal Medicine

## 2021-12-11 ENCOUNTER — Encounter: Payer: Self-pay | Admitting: Internal Medicine

## 2021-12-11 ENCOUNTER — Other Ambulatory Visit: Payer: Medicare PPO

## 2021-12-11 VITALS — BP 102/62 | HR 72 | Temp 98.6°F | Ht 65.5 in | Wt 161.0 lb

## 2021-12-11 DIAGNOSIS — R600 Localized edema: Secondary | ICD-10-CM | POA: Diagnosis not present

## 2021-12-11 DIAGNOSIS — I700 Atherosclerosis of aorta: Secondary | ICD-10-CM

## 2021-12-11 DIAGNOSIS — E782 Mixed hyperlipidemia: Secondary | ICD-10-CM

## 2021-12-11 DIAGNOSIS — R739 Hyperglycemia, unspecified: Secondary | ICD-10-CM

## 2021-12-11 DIAGNOSIS — Z0000 Encounter for general adult medical examination without abnormal findings: Secondary | ICD-10-CM | POA: Diagnosis not present

## 2021-12-11 DIAGNOSIS — I10 Essential (primary) hypertension: Secondary | ICD-10-CM | POA: Diagnosis not present

## 2021-12-11 DIAGNOSIS — K219 Gastro-esophageal reflux disease without esophagitis: Secondary | ICD-10-CM | POA: Diagnosis not present

## 2021-12-11 DIAGNOSIS — J0100 Acute maxillary sinusitis, unspecified: Secondary | ICD-10-CM

## 2021-12-11 DIAGNOSIS — M549 Dorsalgia, unspecified: Secondary | ICD-10-CM

## 2021-12-11 DIAGNOSIS — I4821 Permanent atrial fibrillation: Secondary | ICD-10-CM

## 2021-12-11 DIAGNOSIS — J019 Acute sinusitis, unspecified: Secondary | ICD-10-CM | POA: Insufficient documentation

## 2021-12-11 DIAGNOSIS — K52831 Collagenous colitis: Secondary | ICD-10-CM

## 2021-12-11 DIAGNOSIS — G8929 Other chronic pain: Secondary | ICD-10-CM

## 2021-12-11 MED ORDER — DOXYCYCLINE HYCLATE 100 MG PO TABS: 100.0000 mg | ORAL_TABLET | Freq: Two times a day (BID) | ORAL | 0 refills | Status: AC

## 2021-12-11 NOTE — Assessment & Plan Note (Signed)
Chronic ?Intermittent ?Takes budesonide as needed per GI ?Takes dicyclomine as needed ?

## 2021-12-11 NOTE — Assessment & Plan Note (Signed)
Acute ?Likely bacterial  ?Start doxycyline 100 mg BID x 10 day ?otc cold medications ?Rest, fluid ?Call if no improvement ? ?

## 2021-12-11 NOTE — Assessment & Plan Note (Signed)
Chronic Check a1c Low sugar / carb diet Stressed regular exercise  

## 2021-12-11 NOTE — Assessment & Plan Note (Signed)
Chronic ?Asymptomatic ?Following with cardiology ?Continue bisoprolol, Cardizem and Eliquis ?

## 2021-12-11 NOTE — Assessment & Plan Note (Signed)
Chronic ?Regular exercise and healthy diet encouraged ?Check lipid panel ?Did not tolerate statins or Zetia ?Continue lifestyle controlled ?

## 2021-12-11 NOTE — Assessment & Plan Note (Signed)
Chronic ?Stable ?Continue furosemide 20 mg daily as needed only ? ?

## 2021-12-11 NOTE — Assessment & Plan Note (Addendum)
Chronic ?Intolerant to statins ?Did not tolerate Zetia ?We will try working on increasing exercise ?Healthy diet ?Check lipids ?

## 2021-12-11 NOTE — Assessment & Plan Note (Signed)
Chronic ?Related to severe scoliosis ?Scoliosis and back pain is limiting her mobility ?Would benefit greatly from rollator-ordered ?

## 2021-12-11 NOTE — Assessment & Plan Note (Signed)
Chronic ?Blood pressure well controlled ?CMP ?Continue bisoprolol 10 mg in the morning 5 mg at bedtime, diltiazem 360 mg daily, spironolactone 25 mg daily ?

## 2021-12-11 NOTE — Assessment & Plan Note (Signed)
Chronic GERD controlled Continue famotidine 20 mg twice daily 

## 2021-12-22 ENCOUNTER — Telehealth: Payer: Self-pay

## 2021-12-22 NOTE — Telephone Encounter (Signed)
Pt is calling to report that she is experiencing a rash( Lumps) to  ?Doxycycline Hyclate.  ? ?Pt states she has taken it for 6 days total and has developed rash(Lumps) on arms, buttock, torso, and shoulders.  ? ?Rash started on 5/7 and new places were discovered 5/9.  ? ?Please advise  ? ?Pt CB [phone] ?

## 2021-12-22 NOTE — Telephone Encounter (Signed)
stop doxycycline - can take zyrtec or benadryl for rash.  If the rash is severe she can come in and give her a steroid injection. ? ?At this point I would not recommend a new antibiotic lets just get this when out of her system.  Medication added to allergy list. ?

## 2021-12-22 NOTE — Telephone Encounter (Signed)
Spoke with patient today. ? ?She said area looks a little better today. ? ?She will add the additional medication and make an appointment if it doesn't get any better.  ?

## 2021-12-23 ENCOUNTER — Encounter: Payer: Medicare PPO | Admitting: Gastroenterology

## 2021-12-23 ENCOUNTER — Ambulatory Visit: Payer: Medicare PPO | Admitting: Nurse Practitioner

## 2021-12-23 ENCOUNTER — Ambulatory Visit (INDEPENDENT_AMBULATORY_CARE_PROVIDER_SITE_OTHER): Payer: Self-pay

## 2021-12-23 ENCOUNTER — Encounter: Payer: Self-pay | Admitting: Nurse Practitioner

## 2021-12-23 VITALS — BP 122/70 | HR 73 | Ht 65.0 in | Wt 161.0 lb

## 2021-12-23 DIAGNOSIS — Z7901 Long term (current) use of anticoagulants: Secondary | ICD-10-CM | POA: Diagnosis not present

## 2021-12-23 DIAGNOSIS — Z86010 Personal history of colonic polyps: Secondary | ICD-10-CM | POA: Diagnosis not present

## 2021-12-23 DIAGNOSIS — B351 Tinea unguium: Secondary | ICD-10-CM

## 2021-12-23 NOTE — Progress Notes (Signed)
? ? ?Assessment  ? ?Patient Profile:  ?[name] [name] is a 81 y.o. female known to Dr. Silverio Decamp with a past medical history of A-fib on Eliquis, collagenous/microscopic colitis, family history of colon cancer, personal history of colon polyps, sigmoid diverticulosis, gastric polyps. Additional medical history as listed in [place] . ? ?History of colon polyps.   ?Sessile serrated polyps in 2017, an 11 mm sessile serrated polyp in March 2020. She is due for 3 year interval surveillance colonoscopy. She has not had any blood in her stool . Hgb is 12.4.  ? ?History of microscopic colitis. In remission ? ?AFib on Eliquis ? ?Plan  ? ?We discussed whether or not to proceed with colonoscopy. Though she looks great and remains very active she is 81 years of age and on a blood thinner for Afib.  She recently experienced some severe nosebleeds on Eliquis which she is another concern in regard to doing a polypectomy. In mid April she spent several hours in ED with ongoing bleeding from both nostrils. ENT packed nose and bleeding stopped but she was kept overnight. She understands that she is at increased for procedures but if Dr. Silverio Decamp feels strongly that colonoscopy should be done then she is willing. I told her that we would let her know.  ? ?HPI  ? ?Chief Complaint : discuss need for colonoscopy  ? ?[name] [name] received a letter requesting she make an office visit to discuss need for her next colonoscopy.  She has a history of sessile serrated polyps in 2017 and 2020.  She takes Eliquis for A-fib.  She has not had any blood in her stool.  Her microscopic colitis is in remission.  She has recently been having nosebleeds.  Last Friday she had a nosebleed that she was able to stop with Afrin.  On Saturday she had a recurrent nosebleed which she stopped with compression.  On Sunday the bleeding returned and was severe.  The bleeding could not be stopped by EMS, she was taken to [facility].  She stayed 7 hours in the emergency  department with ongoing bleeding.  She says an ENT came in to see her around 3 AM and the bleeding finally stopped after her nose was packed.  The cause of the bleed was not determined ? ?Previous GI Evaluation  ? ?January 2017 colonoscopy  ?2 sessile polyps  ( 10-15 m) in cecum removed with hot snare ?1. Surgical [P], random colon biopsies ?- BENIGN COLORECTAL MUCOSA WITH ASSOCIATED BENIGN LYMPHOID AGGREGATES. ?- NO EVIDENCE OF SIGNIFICANT INFLAMMATION, DYSPLASIA OR MALIGNANCY. ?- SEE COMMENT. ?2. Surgical [P], cecum, polyp (2) ?- SESSILE SERRATED POLYP WITHOUT CYTOLOGIC DYSPLASIA (X2). ? ? ?10/24/2018 polyp surveillance colonoscopy.  ?An 11 mm sessile polyp was removed from the cecum.  Scattered small mouth diverticula in the sigmoid.  Nonbleeding internal hemorrhoids.  ? ?Pathology-sessile serrated polyp.  No high-grade dysplasia or malignancy ? ?Labs:  ? ?  Latest Ref Rng & Units 11/30/2021  ?  1:31 AM 06/02/2021  ?  1:58 PM 11/19/2020  ? 12:07 PM  ?CBC  ?WBC 4.0 - 10.5 K/uL 10.4   8.2   7.5    ?Hemoglobin 12.0 - 15.0 g/dL 12.4   13.6   13.9    ?Hematocrit 36.0 - 46.0 % 37.9   40.1   40.9    ?Platelets 150 - 400 K/uL 226   259   240.0    ? ? ? ?  Latest Ref Rng & Units 02/13/2021  ?  9:36 AM 11/19/2020  ? 12:07 PM 11/12/2019  ?  1:53 PM  ?Hepatic Function  ?Total Protein 6.0 - 8.3 g/dL 7.7   7.2   7.1    ?Albumin 3.5 - 5.2 g/dL 4.6   4.4   4.3    ?AST 0 - 37 U/L _0 ?ALT 0 - 35 U/L _1 ?Alk Phosphatase 39 - 117 U/L 68   76   64    ?Total Bilirubin 0.2 - 1.2 mg/dL 0.9   0.5   0.5    ? ? ? ?Past Medical History:  ?Diagnosis Date  ? Allergic rhinitis   ? takes Benadryl at bedtime  ? Allergy   ? Anemia   ? Angio-edema   ? Arthritis   ? osteoarthritis  ? Atrial fibrillation ([place]) 06-10-2011  ? takes Diltiazem and Pradaxa daily  ? Cancer [facility])   ? skin  ? Cataract   ? bilateral cataracts removed  ? Clotting disorder ([place])   ? on PRADAXA - chronic a fib  ? Collagenous colitis   ? recurrent, takes  Budesonide daily as needed   ? Colon polyps 01/14/2010  ? Hyperplastic  ? Cough   ? states every day of her life and every chest xray is always clear  ? GERD (gastroesophageal reflux disease)   ? takes Omeprazole daily  ? History of poliomyelitis   ? Polio  age 21- no significant neuromuscular deficit  ? Hyperlipidemia   ? was on Pravastatin but joint pain;has been off for 2-33month ;takes CoQ10  ? Hypertension   ? takes Bisoprolol and Chlorthalidone daily  ? Potassium deficiency   ? [facility]  ? ? ?Past Surgical History:  ?Procedure Laterality Date  ? ADENOIDECTOMY    ? APPENDECTOMY    ? at age 81 ? BREAST BIOPSY  1947 & 10/07  ? Benign  ? CARDIOVERSION N/A 11/05/2016  ? Procedure: CARDIOVERSION;  Surgeon: TSueanne Margarita MD;  [facility];  Service: Cardiovascular;  Laterality: N/A;  ? cataracts removed    ? COLONOSCOPY    ? ENDOVENOUS ABLATION SAPHENOUS VEIN W/ LASER  03-03-2011 left greater saphenous vein   ? ENDOVENOUS ABLATION SAPHENOUS VEIN W/ LASER  01-06-2011  right greater saphenous vein  ? epidural infection    ? x 2   ? IR RADIOLOGIST EVAL & MGMT  03/07/2017  ? SKIN CANCER EXCISION    ? removal of skin cancer on left leg  ? TONSILLECTOMY    ? as a child  ? TONSILLECTOMY    ? TOTAL KNEE ARTHROPLASTY Right 09/18/2013  ? Procedure: TOTAL KNEE ARTHROPLASTY;  Surgeon: PGarald Balding MD;  [facility] [facility]  Service: Orthopedics;  Laterality: Right;  ? ? ?Current Medications, Allergies, Family History and Social History were reviewed in [employer]. ?  ?  ?Current Outpatient Medications  ?Medication Sig Dispense Refill  ? acetaminophen (TYLENOL) 500 MG tablet Take 1,000 mg by mouth every 6 (six) hours as needed for moderate pain or headache.    ? apixaban (ELIQUIS) 5 MG TABS tablet TAKE 1 TABLET BY MOUTH TWICE DAILY (Patient taking differently: Take 5 mg by mouth 2 (two) times daily.) 180 tablet 1  ? bisoprolol (ZEBETA) 10 MG tablet TAKE 1 TABLET BY MOUTH IN THE  MORNING AND 1/2 AT BEDTIME. (Patient taking differently: Take 5-10 mg by mouth See admin  instructions. 10 mg in the morning ?5 mg at bedtime) 135 tablet 0  ? budesonide (ENTOCORT EC) 3 MG 24 hr capsule Take 1 capsule (3 mg total) by mouth 3 (three) times daily. (Patient taking differently: Take 3 mg by mouth daily as needed (flare up).) 90 capsule 1  ? dicyclomine (BENTYL) 10 MG capsule Take 1 capsule (10 mg total) by mouth 2 (two) times daily as needed for spasms. 90 capsule 1  ? diltiazem (CARDIZEM CD) 360 MG 24 hr capsule TAKE (1) CAPSULE BY MOUTH ONCE DAILY. (Patient taking differently: Take 360 mg by mouth daily.) 90 capsule 1  ? Doxylamine Succinate, Sleep, (UNISOM PO) Take 1 tablet by mouth at bedtime.    ? famotidine (PEPCID) 20 MG tablet Take 20 mg by mouth 2 (two) times daily.    ? furosemide (LASIX) 20 MG tablet Take 20 mg by mouth daily as needed for fluid or edema.    ? sodium fluoride (FLUORISHIELD) 1.1 % GEL dental gel Take 1 application. by mouth once a week. No set day    ? spironolactone (ALDACTONE) 25 MG tablet TAKE 1 TABLET BY MOUTH ONCE DAILY. (Patient taking differently: Take 25 mg by mouth daily.) 90 tablet 2  ? ?No current facility-administered medications for this visit.  ? ? ?Review of Systems: ?No chest pain. No shortness of breath. No urinary complaints.  ? ? ?Physical Exam ? ?Wt Readings from Last 3 Encounters:  ?12/23/21 161 lb (73 kg)  ?12/11/21 161 lb (73 kg)  ?11/29/21 162 lb (73.5 kg)  ? ? ?BP 122/70   Pulse 73   Ht 5' 5" (1.651 m)   Wt 161 lb (73 kg)   BMI 26.79 kg/m?  ?Constitutional:  Generally well appearing female in no acute distress. ?Psychiatric: Pleasant. Normal mood and affect. Behavior is normal. ?EENT: Pupils normal.  Conjunctivae are normal. No scleral icterus. ?Neck supple.  ?Cardiovascular: Normal rate, regular rhythm. No edema ?Pulmonary/chest: Effort normal and breath sounds normal. No wheezing, rales or rhonchi. ?Abdominal: Soft, nondistended, nontender. Bowel  sounds active throughout. There are no masses palpable. No hepatomegaly. ?Neurological: Alert and oriented to person place and time. ?Skin: Skin is warm and dry. No rashes noted. ? ?Tye Savoy, NP  12/23/2021, 2:08 PM ?

## 2021-12-23 NOTE — Progress Notes (Signed)
Patient presents today for the 1st laser treatment. Diagnosed with mycotic nail infection by Dr. Posey Pronto.  ? ?Toenail most affected LEFT- 1st, 2nd, 4th, 5th and RIGHT - 1st, 3rd and 4th. There is a ridge along the base of the 1st left showing new nail growth. ? ?All other systems are negative. ? ?Nails were filed thin. Laser therapy was administered to 1-5 toenails bilateral and patient tolerated the treatment well. All safety precautions were in place.  ? ? ?Laser treatment #2 in 6 weeks ? ?

## 2021-12-23 NOTE — Patient Instructions (Signed)
We will let you know about having another colonoscopy after discussing it with Dr. Silverio Decamp. ? ?Thank you for trusting me with your gastrointestinal care!   ? ?[name] Savoy, NP ? ? ?BMI: ? ?If you are age 81 or older, your body mass index should be between 23-30. Your Body mass index is 26.79 kg/m?Marland Kitchen If this is out of the aforementioned range listed, please consider follow up with your Primary Care Provider. ? ?If you are age 76 or younger, your body mass index should be between 19-25. Your Body mass index is 26.79 kg/m?Marland Kitchen If this is out of the aformentioned range listed, please consider follow up with your Primary Care Provider.  ? ?MY CHART: ? ?The [place] [facility] providers would like to encourage you to use [facility] to communicate with providers for non-urgent requests or questions.  Due to long hold times on the telephone, sending your provider a message by [facility] may be a faster and more efficient way to get a response.  Please allow 48 business hours for a response.  Please remember that this is for non-urgent requests.  ? ?

## 2021-12-29 ENCOUNTER — Other Ambulatory Visit (INDEPENDENT_AMBULATORY_CARE_PROVIDER_SITE_OTHER): Payer: Medicare PPO

## 2021-12-29 DIAGNOSIS — Z01419 Encounter for gynecological examination (general) (routine) without abnormal findings: Secondary | ICD-10-CM | POA: Diagnosis not present

## 2021-12-29 DIAGNOSIS — E782 Mixed hyperlipidemia: Secondary | ICD-10-CM

## 2021-12-29 DIAGNOSIS — R739 Hyperglycemia, unspecified: Secondary | ICD-10-CM | POA: Diagnosis not present

## 2021-12-29 DIAGNOSIS — Z0000 Encounter for general adult medical examination without abnormal findings: Secondary | ICD-10-CM

## 2021-12-29 DIAGNOSIS — Z6828 Body mass index (BMI) 28.0-28.9, adult: Secondary | ICD-10-CM | POA: Diagnosis not present

## 2021-12-29 DIAGNOSIS — I10 Essential (primary) hypertension: Secondary | ICD-10-CM | POA: Diagnosis not present

## 2021-12-29 LAB — CBC WITH DIFFERENTIAL/PLATELET
Basophils Absolute: 0.1 10*3/uL (ref 0.0–0.1)
Basophils Relative: 0.7 % (ref 0.0–3.0)
Eosinophils Absolute: 0.1 10*3/uL (ref 0.0–0.7)
Eosinophils Relative: 1.9 % (ref 0.0–5.0)
HCT: 33.3 % — ABNORMAL LOW (ref 36.0–46.0)
Hemoglobin: 11.2 g/dL — ABNORMAL LOW (ref 12.0–15.0)
Lymphocytes Relative: 16.9 % (ref 12.0–46.0)
Lymphs Abs: 1.3 10*3/uL (ref 0.7–4.0)
MCHC: 33.7 g/dL (ref 30.0–36.0)
MCV: 102.1 fl — ABNORMAL HIGH (ref 78.0–100.0)
Monocytes Absolute: 0.7 10*3/uL (ref 0.1–1.0)
Monocytes Relative: 9.4 % (ref 3.0–12.0)
Neutro Abs: 5.5 10*3/uL (ref 1.4–7.7)
Neutrophils Relative %: 71.1 % (ref 43.0–77.0)
Platelets: 223 10*3/uL (ref 150.0–400.0)
RBC: 3.26 Mil/uL — ABNORMAL LOW (ref 3.87–5.11)
RDW: 13.4 % (ref 11.5–15.5)
WBC: 7.7 10*3/uL (ref 4.0–10.5)

## 2021-12-29 LAB — COMPREHENSIVE METABOLIC PANEL
ALT: 12 U/L (ref 0–35)
AST: 16 U/L (ref 0–37)
Albumin: 4.3 g/dL (ref 3.5–5.2)
Alkaline Phosphatase: 53 U/L (ref 39–117)
BUN: 20 mg/dL (ref 6–23)
CO2: 25 mEq/L (ref 19–32)
Calcium: 9.3 mg/dL (ref 8.4–10.5)
Chloride: 104 mEq/L (ref 96–112)
Creatinine, Ser: 1.11 mg/dL (ref 0.40–1.20)
GFR: 46.72 mL/min — ABNORMAL LOW (ref >60.00)
Glucose, Bld: 82 mg/dL (ref 70–99)
Potassium: 4.3 mEq/L (ref 3.5–5.1)
Sodium: 139 mEq/L (ref 135–145)
Total Bilirubin: 0.6 mg/dL (ref 0.2–1.2)
Total Protein: 6.9 g/dL (ref 6.0–8.3)

## 2021-12-29 LAB — LIPID PANEL
Cholesterol: 195 mg/dL (ref 0–200)
HDL: 59.1 mg/dL (ref >39.00)
LDL Cholesterol: 114 mg/dL — ABNORMAL HIGH (ref 0–99)
NonHDL: 135.51
Total CHOL/HDL Ratio: 3
Triglycerides: 108 mg/dL (ref 0.0–149.0)
VLDL: 21.6 mg/dL (ref 0.0–40.0)

## 2021-12-29 LAB — HEMOGLOBIN A1C: Hgb A1c MFr Bld: 5.2 % (ref 4.6–6.5)

## 2022-01-01 DIAGNOSIS — T7840XA Allergy, unspecified, initial encounter: Secondary | ICD-10-CM | POA: Diagnosis not present

## 2022-01-07 NOTE — Progress Notes (Signed)
Reviewed and agree with documentation and assessment and plan. K. Veena Arles Rumbold , MD   

## 2022-01-27 ENCOUNTER — Encounter: Payer: Self-pay | Admitting: Internal Medicine

## 2022-01-27 NOTE — Progress Notes (Signed)
Subjective:    Patient ID: [name] [name], female    DOB: 1940/10/05, 81 y.o.   MRN: [id number]      HPI [name] [name] is here for  Chief Complaint  Patient presents with   Fatigue    Loss of stamina   Referral    Wants to be referred to ENT specialists (ENT)     Has severe PND and pressure on teeth.  Would like referral  for ENT.  H/o epistaxis.    She has started walking with the walker due to back pain    Yesterday was exhausted after just getting up and getting ready in the morning.  Fatigue really started after nosebleed.    Medications and allergies reviewed with patient and updated if appropriate.  Current Outpatient Medications on File Prior to Visit  Medication Sig Dispense Refill   acetaminophen (TYLENOL) 500 MG tablet Take 1,000 mg by mouth every 6 (six) hours as needed for moderate pain or headache.     apixaban (ELIQUIS) 5 MG TABS tablet TAKE 1 TABLET BY MOUTH TWICE DAILY (Patient taking differently: Take 5 mg by mouth 2 (two) times daily.) 180 tablet 1   bisoprolol (ZEBETA) 10 MG tablet TAKE 1 TABLET BY MOUTH IN THE MORNING AND 1/2 AT BEDTIME. (Patient taking differently: Take 5-10 mg by mouth See admin instructions. 10 mg in the morning 5 mg at bedtime) 135 tablet 0   budesonide (ENTOCORT EC) 3 MG 24 hr capsule Take 1 capsule (3 mg total) by mouth 3 (three) times daily. (Patient taking differently: Take 3 mg by mouth daily as needed (flare up).) 90 capsule 1   conjugated estrogens (PREMARIN) vaginal cream Premarin 0.625 mg/gram vaginal cream  Insert 0.5 applicatorsful twice a week by vaginal route.     dicyclomine (BENTYL) 10 MG capsule Take 1 capsule (10 mg total) by mouth 2 (two) times daily as needed for spasms. 90 capsule 1   diltiazem (CARDIZEM CD) 360 MG 24 hr capsule TAKE (1) CAPSULE BY MOUTH ONCE DAILY. (Patient taking differently: Take 360 mg by mouth daily.) 90 capsule 1   Doxylamine Succinate, Sleep, (UNISOM PO) Take 1 tablet by mouth at bedtime.      famotidine (PEPCID) 20 MG tablet Take 20 mg by mouth 2 (two) times daily.     furosemide (LASIX) 20 MG tablet Take 20 mg by mouth daily as needed for fluid or edema.     sodium fluoride (FLUORISHIELD) 1.1 % GEL dental gel Take 1 application. by mouth once a week. No set day     spironolactone (ALDACTONE) 25 MG tablet TAKE 1 TABLET BY MOUTH ONCE DAILY. (Patient taking differently: Take 25 mg by mouth daily.) 90 tablet 2   No current facility-administered medications on file prior to visit.    Review of Systems  Constitutional:  Positive for fatigue. Negative for fever.  Respiratory:  Positive for shortness of breath (only with big incline).   Cardiovascular:  Negative for chest pain and palpitations.  Musculoskeletal:  Positive for back pain.  Psychiatric/Behavioral:  Negative for sleep disturbance.        Objective:   Vitals:   01/28/22 0815  BP: 126/72  Pulse: 90  Temp: 98.1 F (36.7 C)  SpO2: 98%   BP Readings from Last 3 Encounters:  01/28/22 126/72  12/23/21 122/70  12/11/21 102/62   Wt Readings from Last 3 Encounters:  01/28/22 162 lb (73.5 kg)  12/23/21 161 lb (73 kg)  12/11/21 161 lb (73  kg)   Body mass index is 26.96 kg/m.    Physical Exam Constitutional:      General: She is not in acute distress.    Appearance: Normal appearance.  HENT:     Head: Normocephalic and atraumatic.  Eyes:     Conjunctiva/sclera: Conjunctivae normal.  Cardiovascular:     Rate and Rhythm: Normal rate and regular rhythm.     Heart sounds: Normal heart sounds. No murmur heard. Pulmonary:     Effort: Pulmonary effort is normal. No respiratory distress.     Breath sounds: Normal breath sounds. No wheezing.  Musculoskeletal:     Cervical back: Neck supple.     Right lower leg: No edema.     Left lower leg: No edema.  Lymphadenopathy:     Cervical: No cervical adenopathy.  Skin:    General: Skin is warm and dry.     Findings: No rash.  Neurological:     Mental Status: She  is alert. Mental status is at baseline.  Psychiatric:        Mood and Affect: Mood normal.        Behavior: Behavior normal.            Assessment & Plan:    See Problem List for Assessment and Plan of chronic medical problems.

## 2022-01-28 ENCOUNTER — Ambulatory Visit: Payer: Medicare PPO | Admitting: Internal Medicine

## 2022-01-28 ENCOUNTER — Encounter: Payer: Self-pay | Admitting: Internal Medicine

## 2022-01-28 VITALS — BP 126/72 | HR 90 | Temp 98.1°F | Ht 65.0 in | Wt 162.0 lb

## 2022-01-28 DIAGNOSIS — D649 Anemia, unspecified: Secondary | ICD-10-CM

## 2022-01-28 DIAGNOSIS — E538 Deficiency of other specified B group vitamins: Secondary | ICD-10-CM | POA: Insufficient documentation

## 2022-01-28 DIAGNOSIS — R0989 Other specified symptoms and signs involving the circulatory and respiratory systems: Secondary | ICD-10-CM | POA: Insufficient documentation

## 2022-01-28 DIAGNOSIS — E559 Vitamin D deficiency, unspecified: Secondary | ICD-10-CM | POA: Insufficient documentation

## 2022-01-28 DIAGNOSIS — M8589 Other specified disorders of bone density and structure, multiple sites: Secondary | ICD-10-CM | POA: Diagnosis not present

## 2022-01-28 DIAGNOSIS — M8580 Other specified disorders of bone density and structure, unspecified site: Secondary | ICD-10-CM | POA: Insufficient documentation

## 2022-01-28 DIAGNOSIS — R5383 Other fatigue: Secondary | ICD-10-CM

## 2022-01-28 DIAGNOSIS — I10 Essential (primary) hypertension: Secondary | ICD-10-CM

## 2022-01-28 DIAGNOSIS — Z91018 Allergy to other foods: Secondary | ICD-10-CM | POA: Insufficient documentation

## 2022-01-28 LAB — CBC WITH DIFFERENTIAL/PLATELET
Basophils Absolute: 0.1 10*3/uL (ref 0.0–0.1)
Basophils Relative: 1.1 % (ref 0.0–3.0)
Eosinophils Absolute: 0.2 10*3/uL (ref 0.0–0.7)
Eosinophils Relative: 3.2 % (ref 0.0–5.0)
HCT: 35.7 % — ABNORMAL LOW (ref 36.0–46.0)
Hemoglobin: 11.9 g/dL — ABNORMAL LOW (ref 12.0–15.0)
Lymphocytes Relative: 18 % (ref 12.0–46.0)
Lymphs Abs: 1.3 10*3/uL (ref 0.7–4.0)
MCHC: 33.4 g/dL (ref 30.0–36.0)
MCV: 100.4 fl — ABNORMAL HIGH (ref 78.0–100.0)
Monocytes Absolute: 1.1 10*3/uL — ABNORMAL HIGH (ref 0.1–1.0)
Monocytes Relative: 14.8 % — ABNORMAL HIGH (ref 3.0–12.0)
Neutro Abs: 4.6 10*3/uL (ref 1.4–7.7)
Neutrophils Relative %: 62.9 % (ref 43.0–77.0)
Platelets: 292 10*3/uL (ref 150.0–400.0)
RBC: 3.55 Mil/uL — ABNORMAL LOW (ref 3.87–5.11)
RDW: 13.5 % (ref 11.5–15.5)
WBC: 7.2 10*3/uL (ref 4.0–10.5)

## 2022-01-28 LAB — IBC PANEL
Iron: 47 ug/dL (ref 42–145)
Saturation Ratios: 9.3 % — ABNORMAL LOW (ref 20.0–50.0)
TIBC: 504 ug/dL — ABNORMAL HIGH (ref 250.0–450.0)
Transferrin: 360 mg/dL (ref 212.0–360.0)

## 2022-01-28 LAB — VITAMIN B12: Vitamin B-12: 184 pg/mL — ABNORMAL LOW (ref 211–911)

## 2022-01-28 LAB — TSH: TSH: 1.38 u[IU]/mL (ref 0.35–5.50)

## 2022-01-28 LAB — FERRITIN: Ferritin: 15.7 ng/mL (ref 10.0–291.0)

## 2022-01-28 LAB — VITAMIN D 25 HYDROXY (VIT D DEFICIENCY, FRACTURES): VITD: 27.13 ng/mL — ABNORMAL LOW (ref 30.00–100.00)

## 2022-01-28 MED ORDER — THERA VITAL M PO TABS
1.0000 | ORAL_TABLET | Freq: Every day | ORAL | Status: DC
Start: 2022-01-28 — End: 2022-01-28

## 2022-01-28 NOTE — Assessment & Plan Note (Signed)
Chronic Monitored by gyn  Check vitamin d level

## 2022-01-28 NOTE — Assessment & Plan Note (Signed)
New Since her episode of nosebleed she has had severe postnasal drainage, sinus pressure and pressure in her teeth.  She has also had some episodes of blowing out blood clots that are still present Refer to ENT for further evaluation

## 2022-01-28 NOTE — Assessment & Plan Note (Signed)
Chronic Blood pressure well controlled Continue bisoprolol 10 mg in the morning, 5 mg at bedtime, diltiazem 360 mg daily, spironolactone 25 mg daily These medications are not new and I do not think they are contributing to her fatigue

## 2022-01-28 NOTE — Assessment & Plan Note (Signed)
New She feels this really started after her episode of severe nosebleeds She was slightly anemic with her most recent blood work which could potentially be the cause Worry about vitamin deficiency contributing She is trying to walk with a walker which she needs to use because of her chronic back pain secondary to scoliosis Check TSH, CBC, iron panel, vitamin B-12, vitamin D

## 2022-01-28 NOTE — Patient Instructions (Addendum)
  Blood work was ordered.     Medications changes include :   none     

## 2022-01-28 NOTE — Assessment & Plan Note (Signed)
New ?  Related to severe nosebleed she had recently, iron deficiency, B12 deficiency Check CBC, iron panel and vitamin B12 This may be partially contributing to her fatigue but is out of the ordinary Will recommend what supplements to take based on blood work

## 2022-02-01 ENCOUNTER — Ambulatory Visit (INDEPENDENT_AMBULATORY_CARE_PROVIDER_SITE_OTHER): Payer: Medicare PPO

## 2022-02-01 DIAGNOSIS — E538 Deficiency of other specified B group vitamins: Secondary | ICD-10-CM | POA: Diagnosis not present

## 2022-02-01 MED ORDER — CYANOCOBALAMIN 1000 MCG/ML IJ SOLN
1000.0000 ug | Freq: Once | INTRAMUSCULAR | Status: AC
  Administered 2022-02-01: 1000 ug via INTRAMUSCULAR

## 2022-02-01 NOTE — Progress Notes (Signed)
Administered Vitamin B12 1000 mcg to left deltoid per order from PCP. Patient tolerated injection well.

## 2022-02-08 ENCOUNTER — Ambulatory Visit (INDEPENDENT_AMBULATORY_CARE_PROVIDER_SITE_OTHER): Payer: Medicare PPO

## 2022-02-08 DIAGNOSIS — E538 Deficiency of other specified B group vitamins: Secondary | ICD-10-CM

## 2022-02-08 MED ORDER — CYANOCOBALAMIN 1000 MCG/ML IJ SOLN
1000.0000 ug | Freq: Once | INTRAMUSCULAR | Status: AC
  Administered 2022-02-08: 1000 ug via INTRAMUSCULAR

## 2022-02-12 ENCOUNTER — Other Ambulatory Visit: Payer: Medicare PPO

## 2022-02-22 ENCOUNTER — Ambulatory Visit (INDEPENDENT_AMBULATORY_CARE_PROVIDER_SITE_OTHER): Payer: Medicare PPO

## 2022-02-22 DIAGNOSIS — E538 Deficiency of other specified B group vitamins: Secondary | ICD-10-CM

## 2022-02-22 DIAGNOSIS — B351 Tinea unguium: Secondary | ICD-10-CM

## 2022-02-22 MED ORDER — CYANOCOBALAMIN 1000 MCG/ML IJ SOLN
1000.0000 ug | Freq: Once | INTRAMUSCULAR | Status: AC
  Administered 2022-02-22: 1000 ug via INTRAMUSCULAR

## 2022-02-22 NOTE — Progress Notes (Signed)
After obtaining consent, and per orders of Dr. Quay Burow, injection of B12 given by Archie Balboa. Patient tolerated injection well in left deltoid and was instructed to report any adverse reaction to me immediately.

## 2022-02-22 NOTE — Progress Notes (Signed)
Patient presents today for the 1st laser treatment. Diagnosed with mycotic nail infection by Dr. Posey Pronto.   Toenail most affected LEFT- 1st, 2nd, 4th, 5th and RIGHT - 1st, 3rd and 4th. There is a ridge along the base of the 1st left showing new nail growth.  All other systems are negative.  Nails were filed thin. Laser therapy was administered to 1-5 toenails bilateral and patient tolerated the treatment well. All safety precautions were in place.    Laser treatment #3 in 6 weeks

## 2022-02-23 ENCOUNTER — Other Ambulatory Visit: Payer: Self-pay | Admitting: Cardiology

## 2022-03-01 ENCOUNTER — Ambulatory Visit: Payer: Medicare PPO

## 2022-03-08 ENCOUNTER — Ambulatory Visit (INDEPENDENT_AMBULATORY_CARE_PROVIDER_SITE_OTHER): Payer: Medicare PPO

## 2022-03-08 DIAGNOSIS — E538 Deficiency of other specified B group vitamins: Secondary | ICD-10-CM

## 2022-03-08 MED ORDER — CYANOCOBALAMIN 1000 MCG/ML IJ SOLN
1000.0000 ug | Freq: Once | INTRAMUSCULAR | Status: AC
  Administered 2022-03-08: 1000 ug via INTRAMUSCULAR

## 2022-03-08 NOTE — Progress Notes (Signed)
After obtaining consent, and per orders of Dr. Burns, injection of B12 given by Maricela Kawahara. Patient tolerated injection well in left deltoid and instructed to report any adverse reaction to me immediately.  

## 2022-03-09 ENCOUNTER — Other Ambulatory Visit: Payer: Self-pay | Admitting: Cardiology

## 2022-03-15 NOTE — Progress Notes (Signed)
HPI: FU AFib. Previously had cardioversion but did not hold sinus rhythm and now atrial fibrillation is managed with rate control and anticoagulation. Echocardiogram January 2022 showed normal LV function, mild asymmetric left ventricular hypertrophy, moderate left atrial enlargement, mild mitral regurgitation, mild to moderate tricuspid regurgitation, mild aortic insufficiency.  Nuclear study February 2022 showed apical thinning but no ischemia.  Monitor February 2022 showed atrial fibrillation with PVCs or aberrantly conducted beats rate upper normal to mildly increased.  Since I last saw her, she denies dyspnea, chest pain, palpitations or syncope.  She has chronic pedal edema.  She did have a significant nosebleed since I saw her previously.  Current Outpatient Medications  Medication Sig Dispense Refill   acetaminophen (TYLENOL) 500 MG tablet Take 1,000 mg by mouth every 6 (six) hours as needed for moderate pain or headache.     apixaban (ELIQUIS) 5 MG TABS tablet TAKE 1 TABLET BY MOUTH TWICE DAILY (Patient taking differently: Take 5 mg by mouth 2 (two) times daily.) 180 tablet 1   bisoprolol (ZEBETA) 10 MG tablet TAKE 1 TABLET BY MOUTH IN THE MORNING AND 1/2 AT BEDTIME. 135 tablet 0   budesonide (ENTOCORT EC) 3 MG 24 hr capsule Take 1 capsule (3 mg total) by mouth 3 (three) times daily. (Patient taking differently: Take 3 mg by mouth daily as needed (flare up).) 90 capsule 1   calcium-vitamin D (OSCAL WITH D) 500-5 MG-MCG tablet Take 1 tablet by mouth.     conjugated estrogens (PREMARIN) vaginal cream Premarin 0.625 mg/gram vaginal cream  Insert 0.5 applicatorsful twice a week by vaginal route.     dicyclomine (BENTYL) 10 MG capsule Take 1 capsule (10 mg total) by mouth 2 (two) times daily as needed for spasms. 90 capsule 1   diltiazem (CARDIZEM CD) 360 MG 24 hr capsule TAKE (1) CAPSULE BY MOUTH ONCE DAILY. (Patient taking differently: Take 360 mg by mouth daily.) 90 capsule 1    Doxylamine Succinate, Sleep, (UNISOM PO) Take 1 tablet by mouth at bedtime.     famotidine (PEPCID) 20 MG tablet Take 20 mg by mouth 2 (two) times daily.     furosemide (LASIX) 20 MG tablet Take 20 mg by mouth daily as needed for fluid or edema.     sodium fluoride (FLUORISHIELD) 1.1 % GEL dental gel Take 1 application. by mouth once a week. No set day     spironolactone (ALDACTONE) 25 MG tablet TAKE 1 TABLET BY MOUTH ONCE DAILY. 90 tablet 0   No current facility-administered medications for this visit.     Past Medical History:  Diagnosis Date   Allergic rhinitis    takes Benadryl at bedtime   Allergy    Anemia    Angio-edema    Arthritis    osteoarthritis   Atrial fibrillation ([place]) 06-10-2011   takes Diltiazem and Pradaxa daily   Cancer [facility])    skin   Cataract    bilateral cataracts removed   Clotting disorder ([facility])    on PRADAXA - chronic a fib   Collagenous colitis    recurrent, takes Budesonide daily as needed    Colon polyps 01/14/2010   Hyperplastic   Cough    states every day of her life and every chest xray is always clear   GERD (gastroesophageal reflux disease)    takes Omeprazole daily   History of poliomyelitis    Polio  age 69- no significant neuromuscular deficit   Hyperlipidemia  was on Pravastatin but joint pain;has been off for 2-31month ;takes CoQ10   Hypertension    takes Bisoprolol and Chlorthalidone daily   Potassium deficiency    takes KDUR daily    Past Surgical History:  Procedure Laterality Date   ADENOIDECTOMY     APPENDECTOMY     at age 89  BREAST BIOPSY  1947 & 10/07   Benign   CARDIOVERSION N/A 11/05/2016   Procedure: CARDIOVERSION;  Surgeon: TSueanne Margarita MD;  [place] [facility]  Service: Cardiovascular;  Laterality: N/A;   cataracts removed     COLONOSCOPY     ENDOVENOUS ABLATION SAPHENOUS VEIN W/ LASER  03-03-2011 left greater saphenous vein    ENDOVENOUS ABLATION SAPHENOUS VEIN W/ LASER  01-06-2011  right greater  saphenous vein   epidural infection     x 2    IR RADIOLOGIST EVAL & MGMT  03/07/2017   SKIN CANCER EXCISION     removal of skin cancer on left leg   TONSILLECTOMY     as a child   TONSILLECTOMY     TOTAL KNEE ARTHROPLASTY Right 09/18/2013   Procedure: TOTAL KNEE ARTHROPLASTY;  Surgeon: PGarald Balding MD;  [facility] [facility]  Service: Orthopedics;  Laterality: Right;    Social History   Socioeconomic History   Marital status: Widowed    Spouse name: Not on file   Number of children: 2   Years of education: Not on file   Highest education level: Not on file  Occupational History   Occupation: retired    Comment: [place] food[place] power [place]   Smoking status: Former    Packs/day: 1.00    Years: 18.00    Total pack years: 18.00    Types: Cigarettes    Quit date: 08/17/1987    Years since quitting: 34.6   Smokeless tobacco: Never   Tobacco comments:    quit smoking 1989  Vaping Use   Vaping Use: Never used  Substance and Sexual Activity   Alcohol use: Yes    Alcohol/week: 7.0 standard drinks of alcohol    Types: 7 Glasses of wine per week    Comment: wine - 1-2 glasses a day   Drug use: No   Sexual activity: Not Currently    Partners: Male    Birth control/protection: Surgical  Other Topics Concern   Not on file  Social History Narrative   Retired -Games developer   2 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   Married     Former Smoker quit 1989    Alcohol use-yes -  glasses of wine/day      son works for [employer]- [place]   Social Determinants of Health   Financial Resource Strain: Low Risk  (03/26/2022)   Overall Financial Resource Strain (CARDIA)    Difficulty of Paying Living Expenses: Not hard at all  Food Insecurity: No Food Insecurity (03/26/2022)   Hunger Vital Sign    Worried About Running Out of Food in the Last Year: Never true    RN[place]in the Last Year: Never true  Transportation  Needs: No Transportation Needs (03/26/2022)   PRAPARE - THydrologist(Medical): No    Lack of Transportation (Non-Medical): No  Physical Activity: Insufficiently Active (03/26/2022)   Exercise Vital Sign    Days of Exercise per Week: 3 days  Minutes of Exercise per Session: 30 min  Stress: No Stress Concern Present (03/26/2022)   [place]    Feeling of Stress : Not at all  Social Connections: Moderately Integrated (03/26/2022)   Social Connection and Isolation Panel [NHANES]    Frequency of Communication with Friends and Family: Three times a week    Frequency of Social Gatherings with Friends and Family: Three times a week    Attends Religious Services: More than 4 times per year    Active Member of Clubs or Organizations: Yes    Attends Archivist Meetings: More than 4 times per year    Marital Status: Widowed  Intimate Partner Violence: Not At Risk (03/26/2022)   Humiliation, Afraid, Rape, and Kick questionnaire    Fear of Current or Ex-Partner: No    Emotionally Abused: No    Physically Abused: No    Sexually Abused: No    Family History  Problem Relation Age of Onset   Stomach cancer Maternal Grandfather    Heart disease Paternal Grandfather    Colon cancer Son    Rectal cancer Son 11   Colon cancer Paternal Grandmother    Alcohol abuse Father    Heart disease Father    Stroke Father    Hypertension Father    Hypertension Sister    Other Sister        varicose veins   Peripheral vascular disease Mother    Other Mother        varicose veins   Esophageal cancer Neg Hx    Pancreatic cancer Neg Hx     ROS: no fevers or chills, productive cough, hemoptysis, dysphasia, odynophagia, melena, hematochezia, dysuria, hematuria, rash, seizure activity, orthopnea, PND, claudication. Remaining systems are negative.  Physical Exam: Well-developed well-nourished in no acute  distress.  Skin is warm and dry.  HEENT is normal.  Neck is supple.  Chest is clear to auscultation with normal expansion.  Cardiovascular exam is irregular Abdominal exam nontender or distended. No masses palpated. Extremities show trace edema. neuro grossly intact  ECG-atrial fibrillation with no ST changes.  Personally reviewed  A/P  1 permanent atrial fibrillation-we will continue Cardizem and bisoprolol at present dose for rate control.  Continue apixaban.  Patient did have a significant nosebleed.  If she has recurrent episodes we will consider referral for Watchman device.  2 hypertension-patient's blood pressure is controlled today.  Continue present medications and follow.  3 hyperlipidemia-patient is intolerant to statins.  Continue diet.  3 history of lower ext edema-controlled at present.  Continue diuretic at present dose.  [name], MD

## 2022-03-22 ENCOUNTER — Ambulatory Visit (INDEPENDENT_AMBULATORY_CARE_PROVIDER_SITE_OTHER): Payer: Medicare PPO

## 2022-03-22 DIAGNOSIS — E538 Deficiency of other specified B group vitamins: Secondary | ICD-10-CM | POA: Diagnosis not present

## 2022-03-22 MED ORDER — CYANOCOBALAMIN 1000 MCG/ML IJ SOLN
1000.0000 ug | Freq: Once | INTRAMUSCULAR | Status: AC
  Administered 2022-03-22: 1000 ug via INTRAMUSCULAR

## 2022-03-22 NOTE — Progress Notes (Signed)
Pt came into the office to receive her B12 injection. She tolerated the injection well. No other questions or concerns.

## 2022-03-26 ENCOUNTER — Ambulatory Visit (INDEPENDENT_AMBULATORY_CARE_PROVIDER_SITE_OTHER): Payer: Medicare PPO

## 2022-03-26 DIAGNOSIS — Z0000 Encounter for general adult medical examination without abnormal findings: Secondary | ICD-10-CM

## 2022-03-26 NOTE — Progress Notes (Signed)
Subjective:   [name] [name] is a 81 y.o. female who presents for Medicare Annual (Subsequent) preventive examination.   I connected with [name] [name]  today by telephone and verified that I am speaking with the correct person using two identifiers. Location patient: home Location provider: work Persons participating in the virtual visit: patient, provider.   I discussed the limitations, risks, security and privacy concerns of performing an evaluation and management service by telephone and the availability of in person appointments. I also discussed with the patient that there may be a patient responsible charge related to this service. The patient expressed understanding and verbally consented to this telephonic visit.    Interactive audio and video telecommunications were attempted between this provider and patient, however failed, due to patient having technical difficulties OR patient did not have access to video capability.  We continued and completed visit with audio only.    Review of Systems     Cardiac Risk Factors include: advanced age (>72mn, >>11women)     Objective:    Today's Vitals   There is no height or weight on file to calculate BMI.     03/26/2022    8:58 AM 11/29/2021    6:19 PM 11/19/2020    4:31 PM 10/10/2020   11:32 AM 01/19/2019    4:35 PM 01/04/2018   10:42 AM 07/24/2017    3:45 PM  Advanced Directives  Does Patient Have a Medical Advance Directive? Yes No Yes Yes Yes Yes Yes  Type of AParamedicof [place]ew [place] will   [place] will [place] will [place]Living will [place]Living will  Does patient want to make changes to medical advance directive?   No - Patient declined      Copy of [place] Chart? No - copy requested   No - copy requested No - copy requested No - copy requested No - copy requested  Would patient  like information on creating a medical advance directive?       No - Patient declined    Current Medications (verified) Outpatient Encounter Medications as of 03/26/2022  Medication Sig   acetaminophen (TYLENOL) 500 MG tablet Take 1,000 mg by mouth every 6 (six) hours as needed for moderate pain or headache.   apixaban (ELIQUIS) 5 MG TABS tablet TAKE 1 TABLET BY MOUTH TWICE DAILY (Patient taking differently: Take 5 mg by mouth 2 (two) times daily.)   bisoprolol (ZEBETA) 10 MG tablet TAKE 1 TABLET BY MOUTH IN THE MORNING AND 1/2 AT BEDTIME.   budesonide (ENTOCORT EC) 3 MG 24 hr capsule Take 1 capsule (3 mg total) by mouth 3 (three) times daily. (Patient taking differently: Take 3 mg by mouth daily as needed (flare up).)   calcium-vitamin D (OSCAL WITH D) 500-5 MG-MCG tablet Take 1 tablet by mouth.   conjugated estrogens (PREMARIN) vaginal cream Premarin 0.625 mg/gram vaginal cream  Insert 0.5 applicatorsful twice a week by vaginal route.   dicyclomine (BENTYL) 10 MG capsule Take 1 capsule (10 mg total) by mouth 2 (two) times daily as needed for spasms.   diltiazem (CARDIZEM CD) 360 MG 24 hr capsule TAKE (1) CAPSULE BY MOUTH ONCE DAILY. (Patient taking differently: Take 360 mg by mouth daily.)   Doxylamine Succinate, Sleep, (UNISOM PO) Take 1 tablet by mouth at bedtime.   famotidine (PEPCID) 20 MG tablet Take 20 mg by mouth 2 (two) times daily.  furosemide (LASIX) 20 MG tablet Take 20 mg by mouth daily as needed for fluid or edema.   sodium fluoride (FLUORISHIELD) 1.1 % GEL dental gel Take 1 application. by mouth once a week. No set day   spironolactone (ALDACTONE) 25 MG tablet TAKE 1 TABLET BY MOUTH ONCE DAILY.   No facility-administered encounter medications on file as of 03/26/2022.    Allergies (verified) Levaquin [levofloxacin in d5w], Polysporin [bacitracin-polymyxin b], Pravastatin, Sulfa antibiotics, Sulfonamide derivatives, Augmentin [amoxicillin-pot clavulanate], Doxycycline, and  Erythromycin   History: Past Medical History:  Diagnosis Date   Allergic rhinitis    takes Benadryl at bedtime   Allergy    Anemia    Angio-edema    Arthritis    osteoarthritis   Atrial fibrillation ([place]) 06-10-2011   takes Diltiazem and Pradaxa daily   Cancer [facility])    skin   Cataract    bilateral cataracts removed   Clotting disorder (HCC)    on PRADAXA - chronic a fib   Collagenous colitis    recurrent, takes Budesonide daily as needed    Colon polyps 01/14/2010   Hyperplastic   Cough    states every day of her life and every chest xray is always clear   GERD (gastroesophageal reflux disease)    takes Omeprazole daily   History of poliomyelitis    Polio  age 89- no significant neuromuscular deficit   Hyperlipidemia    was on Pravastatin but joint pain;has been off for 2-82month ;takes CoQ10   Hypertension    takes Bisoprolol and Chlorthalidone daily   Potassium deficiency    takes KDUR daily   Past Surgical History:  Procedure Laterality Date   ADENOIDECTOMY     APPENDECTOMY     at age 81  BREAST BIOPSY  1947 & 10/07   Benign   CARDIOVERSION N/A 11/05/2016   Procedure: CARDIOVERSION;  Surgeon: TSueanne Margarita MD;  [place]  Service: Cardiovascular;  Laterality: N/A;   cataracts removed     COLONOSCOPY     ENDOVENOUS ABLATION SAPHENOUS VEIN W/ LASER  03-03-2011 left greater saphenous vein    ENDOVENOUS ABLATION SAPHENOUS VEIN W/ LASER  01-06-2011  right greater saphenous vein   epidural infection     x 2    IR RADIOLOGIST EVAL & MGMT  03/07/2017   SKIN CANCER EXCISION     removal of skin cancer on left leg   TONSILLECTOMY     as a child   TONSILLECTOMY     TOTAL KNEE ARTHROPLASTY Right 09/18/2013   Procedure: TOTAL KNEE ARTHROPLASTY;  Surgeon: PGarald Balding MD;  [place]  Service: Orthopedics;  Laterality: Right;   Family History  Problem Relation Age of Onset   Stomach cancer Maternal Grandfather    Heart disease Paternal  Grandfather    Colon cancer Son    Rectal cancer Son 446  Colon cancer Paternal Grandmother    Alcohol abuse Father    Heart disease Father    Stroke Father    Hypertension Father    Hypertension Sister    Other Sister        varicose veins   Peripheral vascular disease Mother    Other Mother        varicose veins   Esophageal cancer Neg Hx    Pancreatic cancer Neg Hx    Social History   Socioeconomic History   Marital status: Widowed    Spouse name: Not on file  Number of children: 2   Years of education: Not on file   Highest education level: Not on file  Occupational History   Occupation: retired    Comment: helps people with food, keeping power on, etc in [place] Use   Smoking status: Former    Packs/day: 1.00    Years: 18.00    Total pack years: 18.00    Types: Cigarettes    Quit date: 08/17/1987    Years since quitting: 34.6   Smokeless tobacco: Never   Tobacco comments:    quit smoking 1989  Vaping Use   Vaping Use: Never used  Substance and Sexual Activity   Alcohol use: Yes    Alcohol/week: 7.0 standard drinks of alcohol    Types: 7 Glasses of wine per week    Comment: wine - 1-2 glasses a day   Drug use: No   Sexual activity: Not Currently    Partners: Male    Birth control/protection: Surgical  Other Topics Concern   Not on file  Social History Narrative   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   Married     Former Smoker quit 1989    Alcohol use-yes -  glasses of wine/day      son works for [employer] - she flies to [place] to visit her grandchildren   Social Determinants of Health   Financial Resource Strain: Low Risk  (03/26/2022)   Overall Financial Resource Strain (CARDIA)    Difficulty of Paying Living Expenses: Not hard at all  Food Insecurity: No Food Insecurity (03/26/2022)   Hunger Vital Sign    Worried About Running Out of Food in the Last Year: Never true    [place] in the Last Year: Never true   Transportation Needs: No Transportation Needs (03/26/2022)   PRAPARE - Hydrologist (Medical): No    Lack of Transportation (Non-Medical): No  Physical Activity: Insufficiently Active (03/26/2022)   Exercise Vital Sign    Days of Exercise per Week: 3 days    Minutes of Exercise per Session: 30 min  Stress: No Stress Concern Present (03/26/2022)   [place]    Feeling of Stress : Not at all  Social Connections: Moderately Integrated (03/26/2022)   Social Connection and Isolation Panel [NHANES]    Frequency of Communication with Friends and Family: Three times a week    Frequency of Social Gatherings with Friends and Family: Three times a week    Attends Religious Services: More than 4 times per year    Active Member of Clubs or Organizations: Yes    Attends Archivist Meetings: More than 4 times per year    Marital Status: Widowed    Tobacco Counseling Counseling given: Not Answered Tobacco comments: quit smoking 1989   Clinical Intake:  Pre-visit preparation completed: Yes  Pain : No/denies pain     Nutritional Risks: None Diabetes: No  How often do you need to have someone help you when you read instructions, pamphlets, or other written materials from your doctor or pharmacy?: 1 - Never What is the last grade level you completed in school?: college  Diabetic?no   Interpreter Needed?: No  Information entered by :: L.Wilson,LPN   Activities of Daily Living    03/26/2022    9:02 AM  In your present state of health, do you have any difficulty performing the following  activities:  Hearing? 0  Vision? 0  Difficulty concentrating or making decisions? 0  Walking or climbing stairs? 0  Dressing or bathing? 0  Doing errands, shopping? 0  Preparing Food and eating ? N  Using the Toilet? N  In the past six months, have you accidently leaked urine? N  Do you have  problems with loss of bowel control? N  Managing your Medications? N  Managing your Finances? N  Housekeeping or managing your Housekeeping? N    Patient Care Team: Binnie Rail, MD as PCP - General (Internal Medicine) Stanford Breed Denice Bors, MD as PCP - Cardiology (Cardiology) Lafayette Dragon, MD (Inactive) (Gastroenterology) Garald Balding, MD (Orthopedic Surgery) Lelon Perla, MD (Cardiology)  Indicate any recent Medical Services you may have received from other than [facility] providers in the past year (date may be approximate).     Assessment:   This is a routine wellness examination for Greg.  Hearing/Vision screen Vision Screening - Comments:: Annual eye exams wear glasses   Dietary issues and exercise activities discussed: Current Exercise Habits: Home exercise routine, Type of exercise: walking, Time (Minutes): 30, Frequency (Times/Week): 3, Weekly Exercise (Minutes/Week): 90, Intensity: Mild, Exercise limited by: None identified   Goals Addressed   None    Depression Screen    03/26/2022    8:58 AM 03/26/2022    8:57 AM 01/28/2022    8:24 AM 12/11/2021    2:28 PM 11/19/2020   12:46 PM 01/19/2019    1:37 PM 01/04/2018   10:44 AM  PHQ 2/9 Scores  PHQ - 2 Score 0 0 0 0 0 1 1  PHQ- 9 Score   '1  1  2    '$ Fall Risk    03/26/2022    8:58 AM 01/28/2022    8:24 AM 12/11/2021    2:28 PM 11/19/2020   10:59 AM 11/12/2019    1:08 PM  Fall Risk   Falls in the past year? 0 0 1 0 0  Number falls in past yr: 0 0 0 0 0  Injury with Fall? 0 0 0 0 0  Risk for fall due to :  No Fall Risks  No Fall Risks   Follow up Falls evaluation completed;Education provided Falls evaluation completed  Falls evaluation completed     [place]:  Any stairs in or around the home? Yes  If so, are there any without handrails? No  Home free of loose throw rugs in walkways, pet beds, electrical cords, etc? Yes  Adequate lighting in your home to reduce risk of falls?  Yes   ASSISTIVE DEVICES UTILIZED TO PREVENT FALLS:  Life alert? No  Use of a cane, walker or w/c? No  Grab bars in the bathroom? No  Shower chair or bench in shower? Yes  Elevated toilet seat or a handicapped toilet? No    Cognitive Function:  Normal cognitive status assessed by telephone conversation  by this Nurse Health Advisor. No abnormalities found.      01/04/2018   10:52 AM  MMSE - Mini Mental State Exam  Orientation to time 5  Orientation to Place 5  Registration 3  Attention/ Calculation 5  Recall 3  Language- name 2 objects 2  Language- repeat 1  Language- follow 3 step command 3  Language- read & follow direction 1  Write a sentence 1  Copy design 1  Total score 30        03/26/2022  9:03 AM  6CIT Screen  What Year? 0 points  What month? 0 points  What time? 0 points  Count back from 20 0 points  Months in reverse 0 points  Repeat phrase 0 points  Total Score 0 points    Immunizations Immunization History  Administered Date(s) Administered   Influenza Split 06/29/2011, 06/09/2012   Influenza Whole 06/05/2007, 06/04/2008, 04/27/2009   Influenza, High Dose Seasonal PF 06/05/2018, 05/09/2020   Influenza,inj,quad, With Preservative 06/11/2019   Influenza-Unspecified 05/16/2013, 06/17/2015, 06/15/2017, 05/23/2019   Moderna Sars-Covid-2 Vaccination 08/23/2019, 09/21/2019, 06/12/2020   Pneumococcal Conjugate-13 07/24/2013   Pneumococcal Polysaccharide-23 04/23/2008   Td 10/20/1998, 09/19/2009   Zoster, Live 01/25/2006    TDAP status: Due, Education has been provided regarding the importance of this vaccine. Advised may receive this vaccine at local pharmacy or Health Dept. Aware to provide a copy of the vaccination record if obtained from local pharmacy or Health Dept. Verbalized acceptance and understanding.  Flu Vaccine status: Up to date  Pneumococcal vaccine status: Up to date  Covid-19 vaccine status: Completed vaccines  Qualifies for  Shingles Vaccine? Yes   Zostavax completed No   Shingrix Completed?: No.    Education has been provided regarding the importance of this vaccine. Patient has been advised to call insurance company to determine out of pocket expense if they have not yet received this vaccine. Advised may also receive vaccine at local pharmacy or Health Dept. Verbalized acceptance and understanding.  Screening Tests Health Maintenance  Topic Date Due   Zoster Vaccines- Shingrix (1 of 2) Never done   TETANUS/TDAP  09/20/2019   COVID-19 Vaccine (4 - Moderna risk series) 08/07/2020   COLONOSCOPY (Pts 45-67yr Insurance coverage will need to be confirmed)  10/23/2021   INFLUENZA VACCINE  03/16/2022   DEXA SCAN  12/12/2022 (Originally 10/25/2005)   Pneumonia Vaccine 81 Years old  Completed   HPV VACCINES  Aged Out    Health Maintenance  Health Maintenance Due  Topic Date Due   Zoster Vaccines- Shingrix (1 of 2) Never done   TETANUS/TDAP  09/20/2019   COVID-19 Vaccine (4 - Moderna risk series) 08/07/2020   COLONOSCOPY (Pts 45-47yrInsurance coverage will need to be confirmed)  10/23/2021   INFLUENZA VACCINE  03/16/2022    Colorectal cancer screening: No longer required.   Mammogram status: No longer required due to age.  Bone Density status: Ordered patient will schedule . Pt provided with contact info and advised to call to schedule appt.  Lung Cancer Screening: (Low Dose CT Chest recommended if Age 81-80ears, 30 pack-year currently smoking OR have quit w/in 15years.) does not qualify.   Lung Cancer Screening Referral: n/a  Additional Screening:  Hepatitis C Screening: does not qualify;   Vision Screening: Recommended annual ophthalmology exams for early detection of glaucoma and other disorders of the eye. Is the patient up to date with their annual eye exam?  Yes  Who is the provider or what is the name of the office in which the patient attends annual eye exams? Dr.Patty  If pt is not  established with a provider, would they like to be referred to a provider to establish care? No .   Dental Screening: Recommended annual dental exams for proper oral hygiene  Community Resource Referral / Chronic Care Management: CRR required this visit?  No   CCM required this visit?  No      Plan:     I have personally reviewed and noted the following in  the patient's chart:   Medical and social history Use of alcohol, tobacco or illicit drugs  Current medications and supplements including opioid prescriptions.  Functional ability and status Nutritional status Physical activity Advanced directives List of other physicians Hospitalizations, surgeries, and ER visits in previous 12 months Vitals Screenings to include cognitive, depression, and falls Referrals and appointments  In addition, I have reviewed and discussed with patient certain preventive protocols, quality metrics, and best practice recommendations. A written personalized care plan for preventive services as well as general preventive health recommendations were provided to patient.     Daphane Shepherd, LPN   [id number]/[id number]   Nurse Notes: none

## 2022-03-26 NOTE — Patient Instructions (Signed)
[name] [name] , Thank you for taking time to come for your Medicare Wellness Visit. I appreciate your ongoing commitment to your health goals. Please review the following plan we discussed and let me know if I can assist you in the future.   Screening recommendations/referrals: Colonoscopy: no longer required  Mammogram: no longer required  Bone Density: patient will  Recommended yearly ophthalmology/optometry visit for glaucoma screening and checkup Recommended yearly dental visit for hygiene and checkup  Vaccinations: Influenza vaccine: completed  Pneumococcal vaccine: completed  Tdap vaccine: completed per patient  Shingles vaccine: completed per patient     Advanced directives: yes   Conditions/risks identified: none   Next appointment: none    Preventive Care 81 Years and Older, Female Preventive care refers to lifestyle choices and visits with your health care provider that can promote health and wellness. What does preventive care include? A yearly physical exam. This is also called an annual well check. Dental exams once or twice a year. Routine eye exams. Ask your health care provider how often you should have your eyes checked. Personal lifestyle choices, including: Daily care of your teeth and gums. Regular physical activity. Eating a healthy diet. Avoiding tobacco and drug use. Limiting alcohol use. Practicing safe sex. Taking low-dose aspirin every day. Taking vitamin and mineral supplements as recommended by your health care provider. What happens during an annual well check? The services and screenings done by your health care provider during your annual well check will depend on your age, overall health, lifestyle risk factors, and family history of disease. Counseling  Your health care provider may ask you questions about your: Alcohol use. Tobacco use. Drug use. Emotional well-being. Home and relationship well-being. Sexual activity. Eating  habits. History of falls. Memory and ability to understand (cognition). Work and work Statistician. Reproductive health. Screening  You may have the following tests or measurements: Height, weight, and BMI. Blood pressure. Lipid and cholesterol levels. These may be checked every 5 years, or more frequently if you are over 78 years old. Skin check. Lung cancer screening. You may have this screening every year starting at age 61 if you have a 30-pack-year history of smoking and currently smoke or have quit within the past 15 years. Fecal occult blood test (FOBT) of the stool. You may have this test every year starting at age 69. Flexible sigmoidoscopy or colonoscopy. You may have a sigmoidoscopy every 5 years or a colonoscopy every 10 years starting at age 36. Hepatitis C blood test. Hepatitis B blood test. Sexually transmitted disease (STD) testing. Diabetes screening. This is done by checking your blood sugar (glucose) after you have not eaten for a while (fasting). You may have this done every 1-3 years. Bone density scan. This is done to screen for osteoporosis. You may have this done starting at age 2. Mammogram. This may be done every 1-2 years. Talk to your health care provider about how often you should have regular mammograms. Talk with your health care provider about your test results, treatment options, and if necessary, the need for more tests. Vaccines  Your health care provider may recommend certain vaccines, such as: Influenza vaccine. This is recommended every year. Tetanus, diphtheria, and acellular pertussis (Tdap, Td) vaccine. You may need a Td booster every 10 years. Zoster vaccine. You may need this after age 81. Pneumococcal 13-valent conjugate (PCV13) vaccine. One dose is recommended after age 66. Pneumococcal polysaccharide (PPSV23) vaccine. One dose is recommended after age 70. Talk to your health  care provider about which screenings and vaccines you need and how  often you need them. This information is not intended to replace advice given to you by your health care provider. Make sure you discuss any questions you have with your health care provider. Document Released: 08/29/2015 Document Revised: 04/21/2016 Document Reviewed: 06/03/2015 Elsevier Interactive Patient Education  2017 [place] Prevention in the Home Falls can cause injuries. They can happen to people of all ages. There are many things you can do to make your home safe and to help prevent falls. What can I do on the outside of my home? Regularly fix the edges of walkways and driveways and fix any cracks. Remove anything that might make you trip as you walk through a door, such as a raised step or threshold. Trim any bushes or trees on the path to your home. Use bright outdoor lighting. Clear any walking paths of anything that might make someone trip, such as rocks or tools. Regularly check to see if handrails are loose or broken. Make sure that both sides of any steps have handrails. Any raised decks and porches should have guardrails on the edges. Have any leaves, snow, or ice cleared regularly. Use sand or salt on walking paths during winter. Clean up any spills in your garage right away. This includes oil or grease spills. What can I do in the bathroom? Use night lights. Install grab bars by the toilet and in the tub and shower. Do not use towel bars as grab bars. Use non-skid mats or decals in the tub or shower. If you need to sit down in the shower, use a plastic, non-slip stool. Keep the floor dry. Clean up any water that spills on the floor as soon as it happens. Remove soap buildup in the tub or shower regularly. Attach bath mats securely with double-sided non-slip rug tape. Do not have throw rugs and other things on the floor that can make you trip. What can I do in the bedroom? Use night lights. Make sure that you have a light by your bed that is easy to  reach. Do not use any sheets or blankets that are too big for your bed. They should not hang down onto the floor. Have a firm chair that has side arms. You can use this for support while you get dressed. Do not have throw rugs and other things on the floor that can make you trip. What can I do in the kitchen? Clean up any spills right away. Avoid walking on wet floors. Keep items that you use a lot in easy-to-reach places. If you need to reach something above you, use a strong step stool that has a grab bar. Keep electrical cords out of the way. Do not use floor polish or wax that makes floors slippery. If you must use wax, use non-skid floor wax. Do not have throw rugs and other things on the floor that can make you trip. What can I do with my stairs? Do not leave any items on the stairs. Make sure that there are handrails on both sides of the stairs and use them. Fix handrails that are broken or loose. Make sure that handrails are as long as the stairways. Check any carpeting to make sure that it is firmly attached to the stairs. Fix any carpet that is loose or worn. Avoid having throw rugs at the top or bottom of the stairs. If you do have throw rugs, attach them to  the floor with carpet tape. Make sure that you have a light switch at the top of the stairs and the bottom of the stairs. If you do not have them, ask someone to add them for you. What else can I do to help prevent falls? Wear shoes that: Do not have high heels. Have rubber bottoms. Are comfortable and fit you well. Are closed at the toe. Do not wear sandals. If you use a stepladder: Make sure that it is fully opened. Do not climb a closed stepladder. Make sure that both sides of the stepladder are locked into place. Ask someone to hold it for you, if possible. Clearly mark and make sure that you can see: Any grab bars or handrails. First and last steps. Where the edge of each step is. Use tools that help you move  around (mobility aids) if they are needed. These include: Canes. Walkers. Scooters. Crutches. Turn on the lights when you go into a dark area. Replace any light bulbs as soon as they burn out. Set up your furniture so you have a clear path. Avoid moving your furniture around. If any of your floors are uneven, fix them. If there are any pets around you, be aware of where they are. Review your medicines with your doctor. Some medicines can make you feel dizzy. This can increase your chance of falling. Ask your doctor what other things that you can do to help prevent falls. This information is not intended to replace advice given to you by your health care provider. Make sure you discuss any questions you have with your health care provider. Document Released: 05/29/2009 Document Revised: 01/08/2016 Document Reviewed: 09/06/2014 Elsevier Interactive Patient Education  2017 [employer].

## 2022-03-29 ENCOUNTER — Encounter: Payer: Self-pay | Admitting: Cardiology

## 2022-03-29 ENCOUNTER — Ambulatory Visit: Payer: Medicare PPO | Admitting: Cardiology

## 2022-03-29 VITALS — BP 122/70 | HR 70 | Ht 65.0 in | Wt 161.0 lb

## 2022-03-29 DIAGNOSIS — I4821 Permanent atrial fibrillation: Secondary | ICD-10-CM

## 2022-03-29 DIAGNOSIS — I10 Essential (primary) hypertension: Secondary | ICD-10-CM

## 2022-03-29 DIAGNOSIS — E7800 Pure hypercholesterolemia, unspecified: Secondary | ICD-10-CM

## 2022-03-29 DIAGNOSIS — J3489 Other specified disorders of nose and nasal sinuses: Secondary | ICD-10-CM | POA: Diagnosis not present

## 2022-03-29 DIAGNOSIS — R0981 Nasal congestion: Secondary | ICD-10-CM | POA: Diagnosis not present

## 2022-03-29 DIAGNOSIS — Z1231 Encounter for screening mammogram for malignant neoplasm of breast: Secondary | ICD-10-CM | POA: Diagnosis not present

## 2022-03-29 NOTE — Patient Instructions (Signed)
Medication Instructions:  Continue same medications *If you need a refill on your cardiac medications before your next appointment, please call your pharmacy*   Lab Work: None ordered   Testing/Procedures: None ordered   Follow-Up: At [facility], you and your health needs are our priority.  As part of our continuing mission to provide you with exceptional heart care, we have created designated Provider Care Teams.  These Care Teams include your primary Cardiologist (physician) and Advanced Practice Providers (APPs -  Physician Assistants and Nurse Practitioners) who all work together to provide you with the care you need, when you need it.  We recommend signing up for the patient portal called "MyChart".  Sign up information is provided on this After Visit Summary.  MyChart is used to connect with patients for Virtual Visits (Telemedicine).  Patients are able to view lab/test results, encounter notes, upcoming appointments, etc.  Non-urgent messages can be sent to your provider as well.   To learn more about what you can do with MyChart, go to [email or web address].      Your next appointment: 1 Year    The format for your next appointment: Office   Provider:  Dr.Crenshaw   Important Information About Sugar

## 2022-04-05 ENCOUNTER — Ambulatory Visit (INDEPENDENT_AMBULATORY_CARE_PROVIDER_SITE_OTHER): Payer: Medicare PPO

## 2022-04-05 DIAGNOSIS — E538 Deficiency of other specified B group vitamins: Secondary | ICD-10-CM

## 2022-04-05 MED ORDER — CYANOCOBALAMIN 1000 MCG/ML IJ SOLN
1000.0000 ug | Freq: Once | INTRAMUSCULAR | Status: AC
  Administered 2022-04-05: 1000 ug via INTRAMUSCULAR

## 2022-04-05 NOTE — Progress Notes (Signed)
After obtaining consent, and per orders of Dr. Quay Burow, injection of B12 given on left deltoid by Marrian Salvage. Patient instructed  to report any adverse reaction to me immediately.

## 2022-04-09 ENCOUNTER — Ambulatory Visit (INDEPENDENT_AMBULATORY_CARE_PROVIDER_SITE_OTHER): Payer: Medicare PPO

## 2022-04-09 DIAGNOSIS — B351 Tinea unguium: Secondary | ICD-10-CM

## 2022-04-09 NOTE — Progress Notes (Signed)
Patient presents today for the 3rd (2nd round) laser treatment. Diagnosed with mycotic nail infection by Dr. Posey Pronto.   Toenail most affected LEFT- 1st, 2nd, 4th, 5th and RIGHT - 1st, 3rd and 4th. There is a ridge along the base of the 1st left showing new nail growth.  All other systems are negative.  Nails were filed thin. Laser therapy was administered to 1-5 toenails bilateral and patient tolerated the treatment well. All safety precautions were in place.    Laser treatment #4 in 6 weeks

## 2022-05-06 ENCOUNTER — Ambulatory Visit (INDEPENDENT_AMBULATORY_CARE_PROVIDER_SITE_OTHER): Payer: Medicare PPO

## 2022-05-06 DIAGNOSIS — E538 Deficiency of other specified B group vitamins: Secondary | ICD-10-CM

## 2022-05-06 MED ORDER — CYANOCOBALAMIN 1000 MCG/ML IJ SOLN
1000.0000 ug | Freq: Once | INTRAMUSCULAR | Status: AC
  Administered 2022-05-06: 1000 ug via INTRAMUSCULAR

## 2022-05-06 NOTE — Progress Notes (Signed)
After obtaining consent, and per orders of Dr. Jones, injection of B12 given by Parisa Pinela. Patient tolerated injection well in left deltoid and instructed to report any adverse reaction to me immediately.  

## 2022-05-17 ENCOUNTER — Other Ambulatory Visit: Payer: Self-pay | Admitting: Cardiology

## 2022-05-17 DIAGNOSIS — I4821 Permanent atrial fibrillation: Secondary | ICD-10-CM

## 2022-05-17 MED ORDER — APIXABAN 5 MG PO TABS: 5.0000 mg | ORAL_TABLET | Freq: Two times a day (BID) | ORAL | 1 refills | Status: DC

## 2022-05-17 NOTE — Telephone Encounter (Signed)
*  STAT* If patient is at the pharmacy, call can be transferred to refill team.   1. Which medications need to be refilled? (please list name of each medication and dose if known)  apixaban (ELIQUIS) 5 MG TABS tablet  2. Which pharmacy/location (including street and city if local pharmacy) is medication to be sent to? [place], [place]  3. Do they need a 30 day or 90 day supply?   90 day supply  Patient is completely out of medication

## 2022-05-17 NOTE — Telephone Encounter (Signed)
Prescription refill request for Eliquis received. Indication: Afib  Last office visit:03/29/22 (Crenshaw)  Scr: 1.11 (12/29/21) Age: 81 Weight: 73kg  Appropriate dose and refill sent to requested pharmacy.

## 2022-05-27 ENCOUNTER — Other Ambulatory Visit: Payer: Self-pay

## 2022-05-27 DIAGNOSIS — I480 Paroxysmal atrial fibrillation: Secondary | ICD-10-CM

## 2022-05-27 MED ORDER — BISOPROLOL FUMARATE 10 MG PO TABS: ORAL_TABLET | ORAL | 0 refills | Status: DC

## 2022-05-27 MED ORDER — DILTIAZEM HCL ER COATED BEADS 360 MG PO CP24: 360.0000 mg | ORAL_CAPSULE | Freq: Every day | ORAL | 0 refills | Status: DC

## 2022-05-27 NOTE — Telephone Encounter (Signed)
Refill request

## 2022-05-28 ENCOUNTER — Ambulatory Visit (INDEPENDENT_AMBULATORY_CARE_PROVIDER_SITE_OTHER): Payer: Medicare PPO

## 2022-05-28 DIAGNOSIS — B351 Tinea unguium: Secondary | ICD-10-CM

## 2022-05-28 NOTE — Progress Notes (Signed)
Patient presents today for the 4th (2nd round) laser treatment. Diagnosed with mycotic nail infection by Dr. Posey Pronto.   Toenail most affected LEFT- 1st, 2nd, 4th, 5th and RIGHT - 1st, 3rd and 4th. There is a ridge along the base of the 1st left showing new nail growth.  All other systems are negative.  Nails were filed thin. Laser therapy was administered to 1-5 toenails bilateral and patient tolerated the treatment well. All safety precautions were in place.    Laser treatment #5 in 6 weeks

## 2022-06-07 ENCOUNTER — Ambulatory Visit (INDEPENDENT_AMBULATORY_CARE_PROVIDER_SITE_OTHER): Payer: Medicare PPO

## 2022-06-07 ENCOUNTER — Ambulatory Visit: Payer: Medicare PPO

## 2022-06-07 DIAGNOSIS — N958 Other specified menopausal and perimenopausal disorders: Secondary | ICD-10-CM | POA: Diagnosis not present

## 2022-06-07 DIAGNOSIS — E538 Deficiency of other specified B group vitamins: Secondary | ICD-10-CM | POA: Diagnosis not present

## 2022-06-07 DIAGNOSIS — K219 Gastro-esophageal reflux disease without esophagitis: Secondary | ICD-10-CM | POA: Diagnosis not present

## 2022-06-07 DIAGNOSIS — Z13820 Encounter for screening for osteoporosis: Secondary | ICD-10-CM | POA: Diagnosis not present

## 2022-06-07 DIAGNOSIS — M8588 Other specified disorders of bone density and structure, other site: Secondary | ICD-10-CM | POA: Diagnosis not present

## 2022-06-07 DIAGNOSIS — R29890 Loss of height: Secondary | ICD-10-CM | POA: Diagnosis not present

## 2022-06-07 DIAGNOSIS — E039 Hypothyroidism, unspecified: Secondary | ICD-10-CM | POA: Diagnosis not present

## 2022-06-07 MED ORDER — CYANOCOBALAMIN 1000 MCG/ML IJ SOLN
1000.0000 ug | Freq: Once | INTRAMUSCULAR | Status: AC
  Administered 2022-06-07: 1000 ug via INTRAMUSCULAR

## 2022-06-07 NOTE — Progress Notes (Signed)
After obtaining consent, and per orders of Dr. Burns, injection of B12  was given in the left deltoid by Donnell Wion P Domonik Levario. Patient instructed to report any adverse reaction to me immediately.  

## 2022-06-14 ENCOUNTER — Telehealth: Payer: Self-pay | Admitting: Cardiology

## 2022-06-14 DIAGNOSIS — I480 Paroxysmal atrial fibrillation: Secondary | ICD-10-CM

## 2022-06-14 MED ORDER — SPIRONOLACTONE 25 MG PO TABS: 25.0000 mg | ORAL_TABLET | Freq: Every day | ORAL | 3 refills | Status: DC

## 2022-06-14 MED ORDER — DILTIAZEM HCL ER COATED BEADS 360 MG PO CP24: 360.0000 mg | ORAL_CAPSULE | Freq: Every day | ORAL | 3 refills | Status: DC

## 2022-06-14 MED ORDER — BISOPROLOL FUMARATE 10 MG PO TABS: ORAL_TABLET | ORAL | 3 refills | Status: DC

## 2022-06-14 NOTE — Telephone Encounter (Signed)
Refill complete 

## 2022-06-14 NOTE — Telephone Encounter (Signed)
*  STAT* If patient is at the pharmacy, call can be transferred to refill team.   1. Which medications need to be refilled? (please list name of each medication and dose if known) bisoprolol (ZEBETA) 10 MG tablet  diltiazem (CARDIZEM CD) 360 MG 24 hr capsule  spironolactone (ALDACTONE) 25 MG tablet  2. Which pharmacy/location (including street and city if local pharmacy) is medication to be sent to? [place], [place]  3. Do they need a 30 day or 90 day supply? [place]

## 2022-07-12 ENCOUNTER — Ambulatory Visit (INDEPENDENT_AMBULATORY_CARE_PROVIDER_SITE_OTHER): Payer: Medicare PPO

## 2022-07-12 DIAGNOSIS — B351 Tinea unguium: Secondary | ICD-10-CM

## 2022-07-12 DIAGNOSIS — E538 Deficiency of other specified B group vitamins: Secondary | ICD-10-CM | POA: Diagnosis not present

## 2022-07-12 MED ORDER — CYANOCOBALAMIN 1000 MCG/ML IJ SOLN
1000.0000 ug | Freq: Once | INTRAMUSCULAR | Status: AC
  Administered 2022-07-12: 1000 ug via INTRAMUSCULAR

## 2022-07-12 NOTE — Progress Notes (Signed)
Patient presents today for the 5th (2nd round) laser treatment. Diagnosed with mycotic nail infection by Dr. Posey Pronto.   Toenail most affected LEFT- 1st, 2nd, 4th, 5th and RIGHT - 1st, 3rd and 4th. There is a ridge along the base of the 1st left showing new nail growth.  All other systems are negative.  Nails were filed thin. Laser therapy was administered to 1-5 toenails bilateral and patient tolerated the treatment well. All safety precautions were in place.    Laser treatment #6 in 8 weeks

## 2022-07-12 NOTE — Progress Notes (Signed)
After obtaining consent, and per orders of Dr. Burns, injection of B12  was given in the left deltoid by Cookie Pore P Eular Panek. Patient instructed to report any adverse reaction to me immediately.  

## 2022-08-11 ENCOUNTER — Ambulatory Visit (INDEPENDENT_AMBULATORY_CARE_PROVIDER_SITE_OTHER): Payer: Medicare PPO

## 2022-08-11 DIAGNOSIS — E538 Deficiency of other specified B group vitamins: Secondary | ICD-10-CM | POA: Diagnosis not present

## 2022-08-11 MED ORDER — CYANOCOBALAMIN 1000 MCG/ML IJ SOLN
1000.0000 ug | Freq: Once | INTRAMUSCULAR | Status: AC
  Administered 2022-08-11: 1000 ug via INTRAMUSCULAR

## 2022-08-11 NOTE — Progress Notes (Signed)
After obtaining consent, and per orders of Dr. Burns, injection of B12 given by Aryanne Gilleland P Camiah Humm. Patient instructed to report any adverse reaction to me immediately.  

## 2022-09-06 ENCOUNTER — Ambulatory Visit (INDEPENDENT_AMBULATORY_CARE_PROVIDER_SITE_OTHER): Payer: Medicare PPO | Admitting: *Deleted

## 2022-09-06 DIAGNOSIS — B351 Tinea unguium: Secondary | ICD-10-CM

## 2022-09-06 NOTE — Progress Notes (Signed)
Patient presents today for the 6th (2nd round) laser treatment. Diagnosed with mycotic nail infection by Dr. Posey Pronto.   Toenail most affected LEFT- 1st, 2nd, 4th, 5th and RIGHT - 1st, 3rd and 4th.   All other systems are negative.  Nails were filed thin. Laser therapy was administered to 1-5 toenails bilateral and patient tolerated the treatment well. All safety precautions were in place.    Patient has completed the recommended laser treatments. He will follow up with Dr. Posey Pronto in 2 months to evaluate progress.

## 2022-09-13 ENCOUNTER — Ambulatory Visit: Payer: Medicare PPO

## 2022-09-15 ENCOUNTER — Ambulatory Visit (INDEPENDENT_AMBULATORY_CARE_PROVIDER_SITE_OTHER): Payer: Medicare PPO | Admitting: *Deleted

## 2022-09-15 DIAGNOSIS — E538 Deficiency of other specified B group vitamins: Secondary | ICD-10-CM

## 2022-09-15 MED ORDER — CYANOCOBALAMIN 1000 MCG/ML IJ SOLN
1000.0000 ug | Freq: Once | INTRAMUSCULAR | Status: AC
  Administered 2022-09-15: 1000 ug via INTRAMUSCULAR

## 2022-09-15 NOTE — Progress Notes (Signed)
Pls cosign for B12 inj../lmb  

## 2022-10-15 ENCOUNTER — Telehealth: Payer: Self-pay

## 2022-10-15 ENCOUNTER — Ambulatory Visit (INDEPENDENT_AMBULATORY_CARE_PROVIDER_SITE_OTHER): Payer: Medicare PPO

## 2022-10-15 DIAGNOSIS — E538 Deficiency of other specified B group vitamins: Secondary | ICD-10-CM

## 2022-10-15 MED ORDER — CYANOCOBALAMIN 1000 MCG/ML IJ SOLN
1000.0000 ug | Freq: Once | INTRAMUSCULAR | Status: AC
  Administered 2022-10-15: 1000 ug via INTRAMUSCULAR

## 2022-10-15 NOTE — Telephone Encounter (Signed)
Pt was in office for [place] for B12.   She would like to know if she can have her B12 level checked before she makes a decision on staying on injections or switching to oral.   Please advise.

## 2022-10-15 NOTE — Telephone Encounter (Signed)
Spoke with patient today. 

## 2022-10-15 NOTE — Telephone Encounter (Signed)
Most likely her B12 level will be good because she has been getting the injections.  The only way we know if she is doing okay with the oral B12 is to monitor it several months after she stops the injections and is taking oral B12.  We can discuss more at her appointment in June if she wishes.

## 2022-10-15 NOTE — Progress Notes (Signed)
B12 given.  Pt tolerated well. Pt is aware to give the office a call for an side effects or reactions. Please co-sign.   

## 2022-10-20 DIAGNOSIS — Z85828 Personal history of other malignant neoplasm of skin: Secondary | ICD-10-CM | POA: Diagnosis not present

## 2022-10-20 DIAGNOSIS — L570 Actinic keratosis: Secondary | ICD-10-CM | POA: Diagnosis not present

## 2022-11-05 ENCOUNTER — Other Ambulatory Visit: Payer: Self-pay | Admitting: Cardiology

## 2022-11-05 DIAGNOSIS — I4821 Permanent atrial fibrillation: Secondary | ICD-10-CM

## 2022-11-05 NOTE — Telephone Encounter (Signed)
Prescription refill request for Eliquis received. Indication: afib  Last office visit: Crenshaw, 03/29/2022 Scr: 1.11, 12/29/2021 Age: 82 yo  Weight: 73 kg   Refill sent.

## 2022-11-10 ENCOUNTER — Ambulatory Visit: Payer: Medicare PPO | Admitting: Podiatry

## 2022-11-10 DIAGNOSIS — B351 Tinea unguium: Secondary | ICD-10-CM | POA: Diagnosis not present

## 2022-11-10 NOTE — Progress Notes (Signed)
Subjective:  Patient ID: [name] [name], female    DOB: 28-Oct-1940,  MRN: [id number]  Chief Complaint  Patient presents with   Nail Problem    Laser follow up would like nail trim    82 y.o. female presents with the above complaint.  Patient presents with a follow-up from undergoing laser treatment x6.  She states she is doing better the laser helped a little bit but she still has some nail fungus.  She would like to know if she should continue the laser.  Denies any other acute complaints  Review of Systems: Negative except as noted in the HPI. Denies N/V/F/Ch.  Past Medical History:  Diagnosis Date   Allergic rhinitis    takes Benadryl at bedtime   Allergy    Anemia    Angio-edema    Arthritis    osteoarthritis   Atrial fibrillation ([place]) 06-10-2011   takes Diltiazem and Pradaxa daily   Cancer [facility])    skin   Cataract    bilateral cataracts removed   Clotting disorder ([facility])    on PRADAXA - chronic a fib   Collagenous colitis    recurrent, takes Budesonide daily as needed    Colon polyps 01/14/2010   Hyperplastic   Cough    states every day of her life and every chest xray is always clear   GERD (gastroesophageal reflux disease)    takes Omeprazole daily   History of poliomyelitis    Polio  age 56- no significant neuromuscular deficit   Hyperlipidemia    was on Pravastatin but joint pain;has been off for 2-45months ;takes CoQ10   Hypertension    takes Bisoprolol and Chlorthalidone daily   Potassium deficiency    takes KDUR daily    Current Outpatient Medications:    acetaminophen (TYLENOL) 500 MG tablet, Take 1,000 mg by mouth every 6 (six) hours as needed for moderate pain or headache., Disp: , Rfl:    bisoprolol (ZEBETA) 10 MG tablet, TAKE 1 TABLET BY MOUTH IN THE MORNING AND 1/2 AT BEDTIME., Disp: 135 tablet, Rfl: 3   budesonide (ENTOCORT EC) 3 MG 24 hr capsule, Take 1 capsule (3 mg total) by mouth 3 (three) times daily. (Patient taking differently: Take 3 mg by  mouth daily as needed (flare up).), Disp: 90 capsule, Rfl: 1   calcium-vitamin D (OSCAL WITH D) 500-5 MG-MCG tablet, Take 1 tablet by mouth., Disp: , Rfl:    conjugated estrogens (PREMARIN) vaginal cream, Premarin 0.625 mg/gram vaginal cream  Insert 0.5 applicatorsful twice a week by vaginal route., Disp: , Rfl:    dicyclomine (BENTYL) 10 MG capsule, Take 1 capsule (10 mg total) by mouth 2 (two) times daily as needed for spasms., Disp: 90 capsule, Rfl: 1   diltiazem (CARDIZEM CD) 360 MG 24 hr capsule, Take 1 capsule (360 mg total) by mouth daily., Disp: 90 capsule, Rfl: 3   Doxylamine Succinate, Sleep, (UNISOM PO), Take 1 tablet by mouth at bedtime., Disp: , Rfl:    ELIQUIS 5 MG TABS tablet, TAKE ONE TABLET BY MOUTH TWICE DAILY, Disp: 180 tablet, Rfl: 1   famotidine (PEPCID) 20 MG tablet, Take 20 mg by mouth 2 (two) times daily., Disp: , Rfl:    furosemide (LASIX) 20 MG tablet, Take 20 mg by mouth daily as needed for fluid or edema., Disp: , Rfl:    sodium fluoride (FLUORISHIELD) 1.1 % GEL dental gel, Take 1 application. by mouth once a week. No set day, Disp: , Rfl:  spironolactone (ALDACTONE) 25 MG tablet, Take 1 tablet (25 mg total) by mouth daily., Disp: 90 tablet, Rfl: 3  Social History   Tobacco Use  Smoking Status Former   Packs/day: 1.00   Years: 18.00   Additional pack years: 0.00   Total pack years: 18.00   Types: Cigarettes   Quit date: 08/17/1987   Years since quitting: 35.2  Smokeless Tobacco Never  Tobacco Comments   quit smoking 1989    Allergies  Allergen Reactions   Levaquin [Levofloxacin In D5w]     Severe join pain, muscle pain, and tendonitis   Polysporin [Bacitracin-Polymyxin B]     Blisters    Pravastatin Other (See Comments)    Muscle pain and weakness   Sulfa Antibiotics    Sulfonamide Derivatives Hives    Childhood reaction   Augmentin [Amoxicillin-Pot Clavulanate] Diarrhea   Doxycycline Rash   Erythromycin Swelling and Rash   Objective:  There  were no vitals filed for this visit. There is no height or weight on file to calculate BMI. Constitutional Well developed. Well nourished.  Vascular Dorsalis pedis pulses palpable bilaterally. Posterior tibial pulses palpable bilaterally. Capillary refill normal to all digits.  No cyanosis or clubbing noted. Pedal hair growth normal.  Neurologic Normal speech. Oriented to person, place, and time. Epicritic sensation to light touch grossly present bilaterally.  Dermatologic Thickened elongated dystrophic toenails which are improving with mycotic nature x1 left hallux.  Orthopedic: Normal joint ROM without pain or crepitus bilaterally. No visible deformities. No bony tenderness.   Radiographs: None Assessment:   No diagnosis found.  Plan:  Patient was evaluated and treated and all questions answered.  Onychomycosis/nail fungus to bilateral foot -All questions and concerns were discussed with the patient in extensive detail. -Patient will continue laser treatment with nail trimming every 3 months for maintenance of the nail fungus.  I discussed with patient she would like to continue doing that.  No follow-ups on file.

## 2022-12-31 ENCOUNTER — Ambulatory Visit: Payer: Medicare PPO | Admitting: Physician Assistant

## 2023-01-07 ENCOUNTER — Encounter: Payer: Self-pay | Admitting: Physician Assistant

## 2023-01-07 ENCOUNTER — Ambulatory Visit (INDEPENDENT_AMBULATORY_CARE_PROVIDER_SITE_OTHER): Payer: Medicare PPO | Admitting: Physician Assistant

## 2023-01-07 ENCOUNTER — Other Ambulatory Visit (INDEPENDENT_AMBULATORY_CARE_PROVIDER_SITE_OTHER): Payer: Medicare PPO

## 2023-01-07 DIAGNOSIS — I480 Paroxysmal atrial fibrillation: Secondary | ICD-10-CM | POA: Diagnosis not present

## 2023-01-07 DIAGNOSIS — M25562 Pain in left knee: Secondary | ICD-10-CM

## 2023-01-07 NOTE — Progress Notes (Signed)
Office Visit Note   Patient: [name] [name]           Date of Birth: [birth year]           MRN: [id number] Visit Date: 01/07/2023              Requested by: Pincus Sanes, MD [address] [place],  [place] [address] PCP: Pincus Sanes, MD  Chief Complaint  Patient presents with   Left Knee - Pain      HPI: [name] [name] is a pleasant 82 year old woman who is approximately 9 years status post right total knee arthroplasty with Dr. Cleophas Dunker.  She overall has been doing quite good.  She has a known history of left knee arthritis.  She said she has been doing some gardening and kneeling on her knees a little bit more trying to avoid pressure on the right knee.  She has noticed some problems with instability on the left knee.  She is concerned about falling.  Denies any significant pain.  Assessment & Plan: Visit Diagnoses:  1. Acute pain of left knee   2. PAF (paroxysmal atrial fibrillation) (HCC)     Plan: She has no effusion her exam is stable.  She does have quite a bit of arthritis and cannot rule out that she had some further cartilage damage which is causing some of her instability feelings.  She does have some braces at home and I think that would be fine for her to try using those for some stability.  Also encouraged her to use a cane and to avoid situations that would place her at a high risk of falling.  I would think that physical therapy for lower extremity strengthening and balance and gait training and fall prevention would be beneficial for her and she is willing to do this put in an order to [facility] which is closer to her home  Follow-Up Instructions: No follow-ups on file.   Ortho Exam  Patient is alert, oriented, no adenopathy, well-dressed, normal affect, normal respiratory effort. Left knee no effusion no redness no erythema she has good flexion extension strength.  No tenderness over the joint line she has poor patellar mobility secondary to arthritic changes she is  neurovascular intact Right knee well-healed surgical incision no evidence of any prominence no tenderness to palpation  Imaging: XR KNEE 3 VIEW LEFT  Result Date: 01/07/2023 Three-view radiographs of her left knee were obtained today.  She does have advanced tricompartmental arthritis of the knee but no acute fractures.  Of note she is status post right total knee arthroplasty in good position  No images are attached to the encounter.  Labs: Lab Results  Component Value Date   HGBA1C 5.2 12/29/2021   HGBA1C 5.3 11/19/2020   HGBA1C 5.4 11/12/2019   CRP 0.3 03/31/2009   REPTSTATUS 06/17/2017 FINAL 06/15/2017   CULT  06/15/2017    NO GROWTH Performed at [facility], [address], [place], [place] [address]    LABORGA NO GROWTH 07/24/2013     Lab Results  Component Value Date   ALBUMIN 4.3 12/29/2021   ALBUMIN 4.6 02/13/2021   ALBUMIN 4.4 11/19/2020    Lab Results  Component Value Date   MG 1.8 03/13/2013   MG 2.0 06/29/2011   MG 1.8 06/25/2011   Lab Results  Component Value Date   VD25OH 27.13 (L) 01/28/2022    No results found for: "PREALBUMIN"    Latest Ref Rng &  Units 01/28/2022    8:58 AM 12/29/2021    8:54 AM 11/30/2021    1:31 AM  CBC EXTENDED  WBC 4.0 - 10.5 K/uL 7.2  7.7  10.4   RBC 3.87 - 5.11 Mil/uL 3.55  3.26  3.63   Hemoglobin 12.0 - 15.0 g/dL 16.1  09.6  04.5   HCT 36.0 - 46.0 % 35.7  33.3  37.9   Platelets 150.0 - 400.0 K/uL 292.0  223.0  226   NEUT# 1.4 - 7.7 K/uL 4.6  5.5    Lymph# 0.7 - 4.0 K/uL 1.3  1.3       There is no height or weight on file to calculate BMI.  Orders:  Orders Placed This Encounter  Procedures   XR KNEE 3 VIEW LEFT   Ambulatory referral to Physical Therapy   No orders of the defined types were placed in this encounter.    Procedures: No procedures performed  Clinical Data: No additional findings.  ROS:  All other systems negative, except as noted in the HPI. Review of Systems  Objective: Vital  Signs: There were no vitals taken for this visit.  Specialty Comments:  No specialty comments available.  PMFS History: Patient Active Problem List   Diagnosis Date Noted   Anemia 01/28/2022   Osteopenia 01/28/2022   Allergy to alpha-gal 01/28/2022   Sinus complaint 01/28/2022   B12 deficiency 01/28/2022   Vitamin D deficiency 01/28/2022   Acute sinus infection 12/11/2021   Aortic atherosclerosis (HCC) 11/19/2020   Bladder prolapse, female, acquired 11/12/2019   Chronic sinusitis 12/06/2018   Hyperglycemia 01/03/2017   DDD (degenerative disc disease), lumbar 01/03/2017   Chronic back pain 01/02/2017   Bilateral leg edema 04/13/2016   Fatigue 05/29/2015   Osteoarthritis of right knee 09/21/2013   S/P total knee replacement using cement 09/18/2013   Anisocoria 07/26/2013   Varicose veins of bilateral lower extremities with other complications 09/02/2011   Paroxysmal Atrial Fibrillation 06/29/2011   COUGH, CHRONIC 03/09/2010   HIP PAIN, BILATERAL 07/17/2009   ALLERGIC RHINITIS 04/23/2008   GERD 11/01/2007   Essential hypertension 09/20/2007   SKIN CANCER, LEG 07/03/2007   Hyperlipidemia 07/03/2007   Colitis, collagenous 07/03/2007   Past Medical History:  Diagnosis Date   Allergic rhinitis    takes Benadryl at bedtime   Allergy    Anemia    Angio-edema    Arthritis    osteoarthritis   Atrial fibrillation (HCC) 06-10-2011   takes Diltiazem and Pradaxa daily   Cancer (HCC)    skin   Cataract    bilateral cataracts removed   Clotting disorder (HCC)    on PRADAXA - chronic a fib   Collagenous colitis    recurrent, takes Budesonide daily as needed    Colon polyps 01/14/2010   Hyperplastic   Cough    states every day of her life and every chest xray is always clear   GERD (gastroesophageal reflux disease)    takes Omeprazole daily   History of poliomyelitis    Polio  age 78- no significant neuromuscular deficit   Hyperlipidemia    was on Pravastatin but joint  pain;has been off for 2-37months ;takes CoQ10   Hypertension    takes Bisoprolol and Chlorthalidone daily   Potassium deficiency    takes KDUR daily    Family History  Problem Relation Age of Onset   Stomach cancer Maternal Grandfather    Heart disease Paternal Grandfather    Colon cancer Son  Rectal cancer Son 53   Colon cancer Paternal Grandmother    Alcohol abuse Father    Heart disease Father    Stroke Father    Hypertension Father    Hypertension Sister    Other Sister        varicose veins   Peripheral vascular disease Mother    Other Mother        varicose veins   Esophageal cancer Neg Hx    Pancreatic cancer Neg Hx     Past Surgical History:  Procedure Laterality Date   ADENOIDECTOMY     APPENDECTOMY     at age 36   BREAST BIOPSY  1947 & 10/07   Benign   CARDIOVERSION N/A 11/05/2016   Procedure: CARDIOVERSION;  Surgeon: Quintella Reichert, MD;  Location: [facility];  Service: Cardiovascular;  Laterality: N/A;   cataracts removed     COLONOSCOPY     ENDOVENOUS ABLATION SAPHENOUS VEIN W/ LASER  03-03-2011 left greater saphenous vein    ENDOVENOUS ABLATION SAPHENOUS VEIN W/ LASER  01-06-2011  right greater saphenous vein   epidural infection     x 2    IR RADIOLOGIST EVAL & MGMT  03/07/2017   SKIN CANCER EXCISION     removal of skin cancer on left leg   TONSILLECTOMY     as a child   TONSILLECTOMY     TOTAL KNEE ARTHROPLASTY Right 09/18/2013   Procedure: TOTAL KNEE ARTHROPLASTY;  Surgeon: Valeria Batman, MD;  Location: [facility] OR;  Service: Orthopedics;  Laterality: Right;   Social History   Occupational History   Occupation: retired    Comment: helps people with food, keeping power on, etc in [place]  Tobacco Use   Smoking status: Former    Packs/day: 1.00    Years: 18.00    Additional pack years: 0.00    Total pack years: 18.00    Types: Cigarettes    Quit date: 08/17/1987    Years since quitting: 35.4   Smokeless tobacco: Never   Tobacco comments:     quit smoking 1989  Vaping Use   Vaping Use: Never used  Substance and Sexual Activity   Alcohol use: Yes    Alcohol/week: 7.0 standard drinks of alcohol    Types: 7 Glasses of wine per week    Comment: wine - 1-2 glasses a day   Drug use: No   Sexual activity: Not Currently    Partners: Male    Birth control/protection: Surgical

## 2023-01-17 DIAGNOSIS — L821 Other seborrheic keratosis: Secondary | ICD-10-CM | POA: Diagnosis not present

## 2023-01-17 DIAGNOSIS — D2261 Melanocytic nevi of right upper limb, including shoulder: Secondary | ICD-10-CM | POA: Diagnosis not present

## 2023-01-17 DIAGNOSIS — D2262 Melanocytic nevi of left upper limb, including shoulder: Secondary | ICD-10-CM | POA: Diagnosis not present

## 2023-01-17 DIAGNOSIS — Z85828 Personal history of other malignant neoplasm of skin: Secondary | ICD-10-CM | POA: Diagnosis not present

## 2023-01-17 DIAGNOSIS — B351 Tinea unguium: Secondary | ICD-10-CM | POA: Diagnosis not present

## 2023-01-17 DIAGNOSIS — D692 Other nonthrombocytopenic purpura: Secondary | ICD-10-CM | POA: Diagnosis not present

## 2023-01-17 DIAGNOSIS — D225 Melanocytic nevi of trunk: Secondary | ICD-10-CM | POA: Diagnosis not present

## 2023-01-17 DIAGNOSIS — L814 Other melanin hyperpigmentation: Secondary | ICD-10-CM | POA: Diagnosis not present

## 2023-01-17 DIAGNOSIS — L918 Other hypertrophic disorders of the skin: Secondary | ICD-10-CM | POA: Diagnosis not present

## 2023-01-30 ENCOUNTER — Encounter: Payer: Self-pay | Admitting: Internal Medicine

## 2023-01-30 NOTE — Progress Notes (Unsigned)
Subjective:    Patient ID: [name] [name], female    DOB: 05-May-1941, 82 y.o.   MRN: [id number]      HPI [name] [name] is here for a Physical exam and her chronic medical problems.    ? Reaptha onsoder  Medications and allergies reviewed with patient and updated if appropriate.  Current Outpatient Medications on File Prior to Visit  Medication Sig Dispense Refill   acetaminophen (TYLENOL) 500 MG tablet Take 1,000 mg by mouth every 6 (six) hours as needed for moderate pain or headache.     bisoprolol (ZEBETA) 10 MG tablet TAKE 1 TABLET BY MOUTH IN THE MORNING AND 1/2 AT BEDTIME. 135 tablet 3   budesonide (ENTOCORT EC) 3 MG 24 hr capsule Take 1 capsule (3 mg total) by mouth 3 (three) times daily. (Patient taking differently: Take 3 mg by mouth daily as needed (flare up).) 90 capsule 1   calcium-vitamin D (OSCAL WITH D) 500-5 MG-MCG tablet Take 1 tablet by mouth.     conjugated estrogens (PREMARIN) vaginal cream Premarin 0.625 mg/gram vaginal cream  Insert 0.5 applicatorsful twice a week by vaginal route.     diltiazem (CARDIZEM CD) 360 MG 24 hr capsule Take 1 capsule (360 mg total) by mouth daily. 90 capsule 3   Doxylamine Succinate, Sleep, (UNISOM PO) Take 1 tablet by mouth at bedtime.     ELIQUIS 5 MG TABS tablet TAKE ONE TABLET BY MOUTH TWICE DAILY 180 tablet 1   famotidine (PEPCID) 20 MG tablet Take 20 mg by mouth 2 (two) times daily.     furosemide (LASIX) 20 MG tablet Take 20 mg by mouth daily as needed for fluid or edema.     spironolactone (ALDACTONE) 25 MG tablet Take 1 tablet (25 mg total) by mouth daily. 90 tablet 3   dicyclomine (BENTYL) 10 MG capsule Take 1 capsule (10 mg total) by mouth 2 (two) times daily as needed for spasms. (Patient not taking: Reported on 01/31/2023) 90 capsule 1   sodium fluoride (FLUORISHIELD) 1.1 % GEL dental gel Take 1 application. by mouth once a week. No set day (Patient not taking: Reported on 01/31/2023)     No current facility-administered medications  on file prior to visit.    Review of Systems  Constitutional:  Negative for fever.  Eyes:  Negative for visual disturbance.  Respiratory:  Negative for cough, shortness of breath and wheezing.   Cardiovascular:  Positive for leg swelling. Negative for chest pain and palpitations.  Gastrointestinal:  Negative for abdominal pain, blood in stool, constipation and diarrhea.       Occ gerd  Genitourinary:  Positive for difficulty urinating. Negative for dysuria.  Musculoskeletal:  Positive for arthralgias and back pain.  Skin:  Negative for rash.  Neurological:  Positive for dizziness (occ with quick turns). Negative for light-headedness and headaches.  Psychiatric/Behavioral:  Negative for dysphoric mood. The patient is not nervous/anxious.        Objective:   Vitals:   01/31/23 1301  BP: 128/82  Pulse: 93  Temp: 97.9 F (36.6 C)  SpO2: 99%   Filed Weights   01/31/23 1301  Weight: 164 lb 3.2 oz (74.5 kg)   Body mass index is 27.32 kg/m.  BP Readings from Last 3 Encounters:  01/31/23 128/82  03/29/22 122/70  01/28/22 126/72    Wt Readings from Last 3 Encounters:  01/31/23 164 lb 3.2 oz (74.5 kg)  03/29/22 161 lb (73 kg)  01/28/22 162 lb (73.5 kg)  Physical Exam Constitutional: She appears well-developed and well-nourished. No distress.  HENT:  Head: Normocephalic and atraumatic.  Right Ear: External ear normal. Normal ear canal and TM Left Ear: External ear normal.  Normal ear canal and TM Mouth/Throat: Oropharynx is clear and moist.  Eyes: Conjunctivae normal.  Neck: Neck supple. No tracheal deviation present. No thyromegaly present.  No carotid bruit  Cardiovascular: Normal rate, regular rhythm and normal heart sounds.   No murmur heard.  No edema. Pulmonary/Chest: Effort normal and breath sounds normal. No respiratory distress. She has no wheezes. She has no rales.  Breast: deferred   Abdominal: Soft. She exhibits no distension. There is no  tenderness.  Lymphadenopathy: She has no cervical adenopathy.  Skin: Skin is warm and dry. She is not diaphoretic.  Psychiatric: She has a normal mood and affect. Her behavior is normal.     Lab Results  Component Value Date   WBC 7.2 01/28/2022   HGB 11.9 (L) 01/28/2022   HCT 35.7 (L) 01/28/2022   PLT 292.0 01/28/2022   GLUCOSE 82 12/29/2021   CHOL 195 12/29/2021   TRIG 108.0 12/29/2021   HDL 59.10 12/29/2021   LDLDIRECT 128.6 12/09/2010   LDLCALC 114 (H) 12/29/2021   ALT 12 12/29/2021   AST 16 12/29/2021   NA 139 12/29/2021   K 4.3 12/29/2021   CL 104 12/29/2021   CREATININE 1.11 12/29/2021   BUN 20 12/29/2021   CO2 25 12/29/2021   TSH 1.38 01/28/2022   INR 1.4 (H) 11/30/2021   HGBA1C 5.2 12/29/2021         Assessment & Plan:   Physical exam: Screening blood work  ordered Exercise   walking Weight   - ok for age  Substance abuse  none   Reviewed recommended immunizations.   Health Maintenance  Topic Date Due   Zoster Vaccines- Shingrix (1 of 2) Never done   DEXA SCAN  Never done   DTaP/Tdap/Td (3 - Tdap) 09/20/2019   COVID-19 Vaccine (4 - 2023-24 season) 04/16/2022   Medicare Annual Wellness (AWV)  03/27/2023   INFLUENZA VACCINE  03/17/2023   Pneumonia Vaccine 80+ Years old  Completed   HPV VACCINES  Aged Out   Colonoscopy  Discontinued          See Problem List for Assessment and Plan of chronic medical problems.

## 2023-01-30 NOTE — Patient Instructions (Addendum)
Blood work was ordered.   The lab is on the first floor.    Medications changes include :   start taking vitamin B12 1000 mg daily     Return in about 6 months (around 08/02/2023) for follow up.    Health Maintenance, Female Adopting a healthy lifestyle and getting preventive care are important in promoting health and wellness. Ask your health care provider about: The right schedule for you to have regular tests and exams. Things you can do on your own to prevent diseases and keep yourself healthy. What should I know about diet, weight, and exercise? Eat a healthy diet  Eat a diet that includes plenty of vegetables, fruits, low-fat dairy products, and lean protein. Do not eat a lot of foods that are high in solid fats, added sugars, or sodium. Maintain a healthy weight Body mass index (BMI) is used to identify weight problems. It estimates body fat based on height and weight. Your health care provider can help determine your BMI and help you achieve or maintain a healthy weight. Get regular exercise Get regular exercise. This is one of the most important things you can do for your health. Most adults should: Exercise for at least 150 minutes each week. The exercise should increase your heart rate and make you sweat (moderate-intensity exercise). Do strengthening exercises at least twice a week. This is in addition to the moderate-intensity exercise. Spend less time sitting. Even light physical activity can be beneficial. Watch cholesterol and blood lipids Have your blood tested for lipids and cholesterol at 82 years of age, then have this test every 5 years. Have your cholesterol levels checked more often if: Your lipid or cholesterol levels are high. You are older than 82 years of age. You are at high risk for heart disease. What should I know about cancer screening? Depending on your health history and family history, you may need to have cancer screening at various  ages. This may include screening for: Breast cancer. Cervical cancer. Colorectal cancer. Skin cancer. Lung cancer. What should I know about heart disease, diabetes, and high blood pressure? Blood pressure and heart disease High blood pressure causes heart disease and increases the risk of stroke. This is more likely to develop in people who have high blood pressure readings or are overweight. Have your blood pressure checked: Every 3-5 years if you are 63-42 years of age. Every year if you are 63 years old or older. Diabetes Have regular diabetes screenings. This checks your fasting blood sugar level. Have the screening done: Once every three years after age 67 if you are at a normal weight and have a low risk for diabetes. More often and at a younger age if you are overweight or have a high risk for diabetes. What should I know about preventing infection? Hepatitis B If you have a higher risk for hepatitis B, you should be screened for this virus. Talk with your health care provider to find out if you are at risk for hepatitis B infection. Hepatitis C Testing is recommended for: Everyone born from [birth year] through 1965. Anyone with known risk factors for hepatitis C. Sexually transmitted infections (STIs) Get screened for STIs, including gonorrhea and chlamydia, if: You are sexually active and are younger than 82 years of age. You are older than 82 years of age and your health care provider tells you that you are at risk for this type of infection. Your sexual activity has changed since you  were last screened, and you are at increased risk for chlamydia or gonorrhea. Ask your health care provider if you are at risk. Ask your health care provider about whether you are at high risk for HIV. Your health care provider may recommend a prescription medicine to help prevent HIV infection. If you choose to take medicine to prevent HIV, you should first get tested for HIV. You should then be tested  every 3 months for as long as you are taking the medicine. Pregnancy If you are about to stop having your period (premenopausal) and you may become pregnant, seek counseling before you get pregnant. Take 400 to 800 micrograms (mcg) of folic acid every day if you become pregnant. Ask for birth control (contraception) if you want to prevent pregnancy. Osteoporosis and menopause Osteoporosis is a disease in which the bones lose minerals and strength with aging. This can result in bone fractures. If you are 71 years old or older, or if you are at risk for osteoporosis and fractures, ask your health care provider if you should: Be screened for bone loss. Take a calcium or vitamin D supplement to lower your risk of fractures. Be given hormone replacement therapy (HRT) to treat symptoms of menopause. Follow these instructions at home: Alcohol use Do not drink alcohol if: Your health care provider tells you not to drink. You are pregnant, may be pregnant, or are planning to become pregnant. If you drink alcohol: Limit how much you have to: 0-1 drink a day. Know how much alcohol is in your drink. In the [place] one drink equals one 12 oz bottle of beer (355 mL), one 5 oz glass of wine (148 mL), or one 1 oz glass of hard liquor (44 mL). Lifestyle Do not use any products that contain nicotine or tobacco. These products include cigarettes, chewing tobacco, and vaping devices, such as e-cigarettes. If you need help quitting, ask your health care provider. Do not use street drugs. Do not share needles. Ask your health care provider for help if you need support or information about quitting drugs. General instructions Schedule regular health, dental, and eye exams. Stay current with your vaccines. Tell your health care provider if: You often feel depressed. You have ever been abused or do not feel safe at home. Summary Adopting a healthy lifestyle and getting preventive care are important in promoting  health and wellness. Follow your health care provider's instructions about healthy diet, exercising, and getting tested or screened for diseases. Follow your health care provider's instructions on monitoring your cholesterol and blood pressure. This information is not intended to replace advice given to you by your health care provider. Make sure you discuss any questions you have with your health care provider. Document Revised: 12/22/2020 Document Reviewed: 12/22/2020 Elsevier Patient Education  2024 [employer].

## 2023-01-31 ENCOUNTER — Ambulatory Visit: Payer: Medicare PPO | Admitting: Internal Medicine

## 2023-01-31 VITALS — BP 128/82 | HR 93 | Temp 97.9°F | Ht 65.0 in | Wt 164.2 lb

## 2023-01-31 DIAGNOSIS — K52831 Collagenous colitis: Secondary | ICD-10-CM

## 2023-01-31 DIAGNOSIS — G8929 Other chronic pain: Secondary | ICD-10-CM

## 2023-01-31 DIAGNOSIS — Z0000 Encounter for general adult medical examination without abnormal findings: Secondary | ICD-10-CM

## 2023-01-31 DIAGNOSIS — E538 Deficiency of other specified B group vitamins: Secondary | ICD-10-CM

## 2023-01-31 DIAGNOSIS — R739 Hyperglycemia, unspecified: Secondary | ICD-10-CM

## 2023-01-31 DIAGNOSIS — I4821 Permanent atrial fibrillation: Secondary | ICD-10-CM | POA: Diagnosis not present

## 2023-01-31 DIAGNOSIS — M8589 Other specified disorders of bone density and structure, multiple sites: Secondary | ICD-10-CM

## 2023-01-31 DIAGNOSIS — E559 Vitamin D deficiency, unspecified: Secondary | ICD-10-CM

## 2023-01-31 DIAGNOSIS — M545 Low back pain, unspecified: Secondary | ICD-10-CM

## 2023-01-31 DIAGNOSIS — I10 Essential (primary) hypertension: Secondary | ICD-10-CM | POA: Diagnosis not present

## 2023-01-31 DIAGNOSIS — I700 Atherosclerosis of aorta: Secondary | ICD-10-CM

## 2023-01-31 DIAGNOSIS — K219 Gastro-esophageal reflux disease without esophagitis: Secondary | ICD-10-CM | POA: Diagnosis not present

## 2023-01-31 DIAGNOSIS — E782 Mixed hyperlipidemia: Secondary | ICD-10-CM

## 2023-01-31 DIAGNOSIS — R600 Localized edema: Secondary | ICD-10-CM

## 2023-01-31 LAB — CBC WITH DIFFERENTIAL/PLATELET
Basophils Absolute: 0.1 10*3/uL (ref 0.0–0.1)
Basophils Relative: 0.8 % (ref 0.0–3.0)
Eosinophils Absolute: 0.1 10*3/uL (ref 0.0–0.7)
Eosinophils Relative: 1.8 % (ref 0.0–5.0)
HCT: 39.9 % (ref 36.0–46.0)
Hemoglobin: 13.3 g/dL (ref 12.0–15.0)
Lymphocytes Relative: 20.3 % (ref 12.0–46.0)
Lymphs Abs: 1.5 10*3/uL (ref 0.7–4.0)
MCHC: 33.3 g/dL (ref 30.0–36.0)
MCV: 104.3 fl — ABNORMAL HIGH (ref 78.0–100.0)
Monocytes Absolute: 1.1 10*3/uL — ABNORMAL HIGH (ref 0.1–1.0)
Monocytes Relative: 14.9 % — ABNORMAL HIGH (ref 3.0–12.0)
Neutro Abs: 4.6 10*3/uL (ref 1.4–7.7)
Neutrophils Relative %: 62.2 % (ref 43.0–77.0)
Platelets: 239 10*3/uL (ref 150.0–400.0)
RBC: 3.83 Mil/uL — ABNORMAL LOW (ref 3.87–5.11)
RDW: 14 % (ref 11.5–15.5)
WBC: 7.4 10*3/uL (ref 4.0–10.5)

## 2023-01-31 LAB — COMPREHENSIVE METABOLIC PANEL
ALT: 16 U/L (ref 0–35)
AST: 19 U/L (ref 0–37)
Albumin: 4.5 g/dL (ref 3.5–5.2)
Alkaline Phosphatase: 63 U/L (ref 39–117)
BUN: 22 mg/dL (ref 6–23)
CO2: 29 mEq/L (ref 19–32)
Calcium: 9.7 mg/dL (ref 8.4–10.5)
Chloride: 105 mEq/L (ref 96–112)
Creatinine, Ser: 0.97 mg/dL (ref 0.40–1.20)
GFR: 54.51 mL/min — ABNORMAL LOW (ref >60.00)
Glucose, Bld: 60 mg/dL — ABNORMAL LOW (ref 70–99)
Potassium: 4.4 mEq/L (ref 3.5–5.1)
Sodium: 140 mEq/L (ref 135–145)
Total Bilirubin: 0.4 mg/dL (ref 0.2–1.2)
Total Protein: 7.7 g/dL (ref 6.0–8.3)

## 2023-01-31 LAB — LIPID PANEL
Cholesterol: 218 mg/dL — ABNORMAL HIGH (ref 0–200)
HDL: 60.3 mg/dL (ref >39.00)
LDL Cholesterol: 134 mg/dL — ABNORMAL HIGH (ref 0–99)
NonHDL: 157.3
Total CHOL/HDL Ratio: 4
Triglycerides: 115 mg/dL (ref 0.0–149.0)
VLDL: 23 mg/dL (ref 0.0–40.0)

## 2023-01-31 LAB — HEMOGLOBIN A1C: Hgb A1c MFr Bld: 5.4 % (ref 4.6–6.5)

## 2023-01-31 LAB — VITAMIN D 25 HYDROXY (VIT D DEFICIENCY, FRACTURES): VITD: 29.26 ng/mL — ABNORMAL LOW (ref 30.00–100.00)

## 2023-01-31 LAB — TSH: TSH: 0.93 u[IU]/mL (ref 0.35–5.50)

## 2023-01-31 LAB — VITAMIN B12: Vitamin B-12: 512 pg/mL (ref 211–911)

## 2023-01-31 MED ORDER — FUROSEMIDE 20 MG PO TABS: 20.0000 mg | ORAL_TABLET | Freq: Every day | ORAL | 1 refills | Status: DC | PRN

## 2023-01-31 NOTE — Assessment & Plan Note (Addendum)
Chronic Taking vitamin d daily - has decreased amount she is taking Check vitamin d level

## 2023-01-31 NOTE — Assessment & Plan Note (Signed)
Chronic Monitored by gyn  Check vitamin d level 

## 2023-01-31 NOTE — Assessment & Plan Note (Signed)
Chronic Blood pressure well controlled Continue bisoprolol 10 mg in the morning, 5 mg at bedtime, diltiazem 360 mg daily, spironolactone 25 mg daily

## 2023-01-31 NOTE — Assessment & Plan Note (Signed)
Chronic ?Intolerant to statins ?Did not tolerate Zetia ?We will try working on increasing exercise ?Healthy diet ?Check lipids ?

## 2023-01-31 NOTE — Assessment & Plan Note (Addendum)
Chronic Stable Continue furosemide 20 mg daily as needed only  -may need to take more often during summer Wearing compression socks

## 2023-01-31 NOTE — Assessment & Plan Note (Signed)
Chronic ?Regular exercise and healthy diet encouraged ?Check lipid panel ?Did not tolerate statins or Zetia ?Continue lifestyle controlled ?

## 2023-01-31 NOTE — Assessment & Plan Note (Signed)
Chronic Check a1c Low sugar / carb diet Stressed regular exercise  

## 2023-01-31 NOTE — Assessment & Plan Note (Addendum)
Chronic Did injections x 6 months Not currently taking B12 - advised to start oral B12 Check B12 level

## 2023-01-31 NOTE — Assessment & Plan Note (Signed)
Chronic ?Asymptomatic ?Following with cardiology ?Continue bisoprolol, Cardizem and Eliquis ?

## 2023-01-31 NOTE — Assessment & Plan Note (Signed)
Chronic Related to severe scoliosis Scoliosis and back pain is limiting her mobility Uses rollator

## 2023-01-31 NOTE — Assessment & Plan Note (Signed)
Chronic ?Intermittent ?Takes budesonide as needed per GI ?Takes dicyclomine as needed ?

## 2023-01-31 NOTE — Assessment & Plan Note (Signed)
Chronic ?GERD controlled ?Continue famotidine 20 mg twice daily ?

## 2023-02-11 ENCOUNTER — Other Ambulatory Visit: Payer: Medicare PPO

## 2023-02-24 NOTE — Therapy (Signed)
OUTPATIENT PHYSICAL THERAPY LOWER EXTREMITY EVALUATION   Patient Name: [name] [name] MRN: [id number] DOB:17-Sep-1940, 82 y.o., female Today's Date: 02/25/2023  END OF SESSION:  PT End of Session - 02/25/23 1449     Visit Number 1    Number of Visits 6    Date for PT Re-Evaluation 04/08/23    Authorization Type [employer] please check auth    PT Start Time 1350    PT Stop Time 1430    PT Time Calculation (min) 40 min    Activity Tolerance Patient tolerated treatment well    Behavior During Therapy [facility] for tasks assessed/performed             Past Medical History:  Diagnosis Date   Allergic rhinitis    takes Benadryl at bedtime   Allergy    Anemia    Angio-edema    Arthritis    osteoarthritis   Atrial fibrillation (HCC) 06-10-2011   takes Diltiazem and Pradaxa daily   Cancer (HCC)    skin   Cataract    bilateral cataracts removed   Clotting disorder (HCC)    on PRADAXA - chronic a fib   Collagenous colitis    recurrent, takes Budesonide daily as needed    Colon polyps 01/14/2010   Hyperplastic   Cough    states every day of her life and every chest xray is always clear   GERD (gastroesophageal reflux disease)    takes Omeprazole daily   History of poliomyelitis    Polio  age 38- no significant neuromuscular deficit   Hyperlipidemia    was on Pravastatin but joint pain;has been off for 2-71months ;takes CoQ10   Hypertension    takes Bisoprolol and Chlorthalidone daily   Potassium deficiency    takes KDUR daily   Past Surgical History:  Procedure Laterality Date   ADENOIDECTOMY     APPENDECTOMY     at age 85   BREAST BIOPSY  1947 & 10/07   Benign   CARDIOVERSION N/A 11/05/2016   Procedure: CARDIOVERSION;  Surgeon: Quintella Reichert, MD;  Location: [facility];  Service: Cardiovascular;  Laterality: N/A;   cataracts removed     COLONOSCOPY     ENDOVENOUS ABLATION SAPHENOUS VEIN W/ LASER  03-03-2011 left greater saphenous vein    ENDOVENOUS ABLATION SAPHENOUS  VEIN W/ LASER  01-06-2011  right greater saphenous vein   epidural infection     x 2    IR RADIOLOGIST EVAL & MGMT  03/07/2017   SKIN CANCER EXCISION     removal of skin cancer on left leg   TONSILLECTOMY     as a child   TONSILLECTOMY     TOTAL KNEE ARTHROPLASTY Right 09/18/2013   Procedure: TOTAL KNEE ARTHROPLASTY;  Surgeon: Valeria Batman, MD;  Location: [facility] OR;  Service: Orthopedics;  Laterality: Right;   Patient Active Problem List   Diagnosis Date Noted   Anemia 01/28/2022   Osteopenia 01/28/2022   Allergy to alpha-gal 01/28/2022   Sinus complaint 01/28/2022   B12 deficiency 01/28/2022   Vitamin D deficiency 01/28/2022   Aortic atherosclerosis (HCC) 11/19/2020   Bladder prolapse, female, acquired 11/12/2019   Chronic sinusitis 12/06/2018   Hyperglycemia 01/03/2017   DDD (degenerative disc disease), lumbar 01/03/2017   Chronic back pain 01/02/2017   Bilateral leg edema 04/13/2016   Fatigue 05/29/2015   Osteoarthritis of right knee 09/21/2013   S/P total knee replacement using cement 09/18/2013   Anisocoria 07/26/2013   Varicose veins  of bilateral lower extremities with other complications 09/02/2011   Paroxysmal Atrial Fibrillation 06/29/2011   COUGH, CHRONIC 03/09/2010   HIP PAIN, BILATERAL 07/17/2009   ALLERGIC RHINITIS 04/23/2008   GERD 11/01/2007   Essential hypertension 09/20/2007   SKIN CANCER, LEG 07/03/2007   Hyperlipidemia 07/03/2007   Colitis, collagenous 07/03/2007    PCP: Pincus Sanes MD  REFERRING PROVIDER: Persons, [place], [place]  REFERRING DIAG: [phone] (ICD-10-CM) - Acute pain of left knee  THERAPY DIAG:  Chronic pain of left knee - Plan: PT plan of care cert/re-cert  Difficulty in walking, not elsewhere classified - Plan: PT plan of care cert/re-cert  Rationale for Evaluation and Treatment: Rehabilitation  ONSET DATE: over a month ago  SUBJECTIVE:   SUBJECTIVE STATEMENT: Saw PCP for check up; told her PCP about left knee pain and she  sent her therapy.  Had an x-ray showing arthritic knee.  Some days it doesn't hurt too bad; increased with prolonged standing, walking. Swelling left knee; wearing bilateral compression stocking.  Has to pause before walking if she sits too long  PERTINENT HISTORY: R TKA Dr. Cleophas Dunker 2015 On eliquis for  Polio as a teenager Back pain PAIN:  Are you having pain? Yes: NPRS scale: 7 to 8 at worse today 2/10 Pain location: left knee Pain description: tight, sore, aching, burning Aggravating factors: standing, walking Relieving factors: sitting, lying down  PRECAUTIONS: Fall  WEIGHT BEARING RESTRICTIONS: No  FALLS:  Has patient fallen in last 6 months? 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  She does  have advanced tricompartmental arthritis of the knee but no acute  fractures.  Of note she is status post right total knee arthroplasty in  good position    PATIENT SURVEYS:  FOTO 56  COGNITION: Overall cognitive status: Within functional limits for tasks assessed     SENSATION: N/T in feet and toes  EDEMA:  Swelling noted left knee versus right  POSTURE: rounded shoulders and forward head  PALPATION: Swelling left knee  LOWER EXTREMITY ROM:  Active ROM Right eval Left eval  Hip flexion    Hip extension    Hip abduction    Hip adduction    Hip internal rotation    Hip external rotation    Knee flexion 115 107  Knee extension 0 -7  Ankle dorsiflexion    Ankle plantarflexion     Ankle inversion    Ankle eversion     (Blank rows = not tested)  LOWER EXTREMITY MMT:  MMT Right eval Left eval  Hip flexion 4+ 4-  Hip extension    Hip abduction    Hip adduction    Hip internal rotation    Hip external rotation    Knee flexion    Knee extension 4+ 4  Ankle dorsiflexion 5 5  Ankle plantarflexion    Ankle inversion    Ankle eversion     (Blank rows = not tested)  FUNCTIONAL TESTS:  5 times sit to stand: 18.44 sec using  hands to assist  GAIT: Distance walked: 50 ft in clinic Assistive device utilized: None Level of assistance: Modified independence Comments: takes one misstep walking in clinic   TODAY'S TREATMENT:                                                                                                                              DATE: 02/24/23 physical therapy evaluation and HEP instruction    PATIENT EDUCATION:  Education details: Patient educated on exam findings, POC, scope of PT, HEP, and what to expect next visit. Person educated: Patient Education method: Explanation, Demonstration, and Handouts Education comprehension: verbalized understanding, returned demonstration, verbal cues required, and tactile cues required  HOME EXERCISE PROGRAM: Access Code: [email or web address] URL: [email or web address] Date: 02/25/2023 Prepared by: [facility] - Rehab  Exercises - Supine Knee Extension Stretch on Towel Roll  - 2 x daily - 7 x weekly - 1 sets - 1 reps - 5 min hold - Supine Quad Set  - 2 x daily - 7 x weekly - 1 sets - 10 reps - Sit to Stand with Armchair  - 2 x daily - 7 x weekly - 1 sets - 5 reps - Seated Long Arc Quad  - 2 x daily - 7 x weekly - 1 sets - 10 reps - 2 sec hold  ASSESSMENT:  CLINICAL IMPRESSION: Patient is a 82 y.o. female who was seen today for physical therapy evaluation and treatment for left knee pain. Patient demonstrates muscle weakness, reduced ROM, and fascial restrictions which are likely contributing to symptoms  of pain and are negatively impacting patient ability to perform ADLs and functional mobility tasks. Patient will benefit from skilled physical therapy services to address these deficits to reduce pain and improve level of function with ADLs and functional mobility tasks.   OBJECTIVE IMPAIRMENTS: Abnormal gait, decreased activity tolerance, decreased balance, decreased mobility, difficulty walking, decreased ROM, decreased strength, hypomobility, increased edema, increased fascial restrictions, impaired perceived functional ability, and pain.   ACTIVITY LIMITATIONS: carrying, lifting, bending, sitting, standing, squatting, sleeping, stairs, transfers, locomotion level, and caring for others  PARTICIPATION LIMITATIONS: meal prep, cleaning, laundry, and community activity   REHAB POTENTIAL: Good  CLINICAL DECISION MAKING: Stable/uncomplicated  EVALUATION COMPLEXITY: Low   GOALS: Goals reviewed with patient? No  SHORT TERM GOALS: Target date: 03/18/2023 patient will be independent with initial HEP   Baseline: Goal status: INITIAL  2.  Patient will self report 30% improvement to improve tolerance for functional activity  Baseline:  Goal status: INITIAL   LONG TERM GOALS: Target date: 04/08/2023  Patient will be independent in self management strategies to improve quality of life and functional outcomes.  Baseline:  Goal status: INITIAL  2.  Patient will self report 50% improvement to improve tolerance for functional activity  Baseline:  Goal status: INITIAL  3.  Patient will increased left knee mobility to 0 to 115 to promote normal navigation of steps; step over step pattern  Baseline: see above Goal status: INITIAL  4.  Patient will increase left  leg MMTs to 5/5 without pain to promote return to ambulation community distances with minimal deviation.  Baseline: see above Goal status: INITIAL  5.  Patient will improve FOTO score by 10 points Baseline: 56 Goal  status: INITIAL    PLAN:  PT FREQUENCY: 1x/week  PT DURATION: 6 weeks  PLANNED INTERVENTIONS: Therapeutic exercises, Therapeutic activity, Neuromuscular re-education, Balance training, Gait training, Patient/Family education, Joint manipulation, Joint mobilization, Stair training, Orthotic/Fit training, DME instructions, Aquatic Therapy, Dry Needling, Electrical stimulation, Spinal manipulation, Spinal mobilization, Cryotherapy, Moist heat, Compression bandaging, scar mobilization, Splintting, Taping, Traction, Ultrasound, Ionotophoresis 4mg /ml Dexamethasone, and Manual therapy   PLAN FOR NEXT SESSION: Review HEP and goals; test SLS time;  progress left knee mobility and strength, balance   2:50 PM, 02/25/23 Nishant Schrecengost Small Consuelo Suthers MPT [place] [place] [phone] [phone]

## 2023-02-25 ENCOUNTER — Other Ambulatory Visit: Payer: Self-pay

## 2023-02-25 ENCOUNTER — Ambulatory Visit (INDEPENDENT_AMBULATORY_CARE_PROVIDER_SITE_OTHER): Payer: Medicare PPO | Admitting: *Deleted

## 2023-02-25 ENCOUNTER — Ambulatory Visit (HOSPITAL_COMMUNITY): Payer: Medicare PPO | Attending: Physician Assistant

## 2023-02-25 DIAGNOSIS — B351 Tinea unguium: Secondary | ICD-10-CM

## 2023-02-25 DIAGNOSIS — M25562 Pain in left knee: Secondary | ICD-10-CM | POA: Insufficient documentation

## 2023-02-25 DIAGNOSIS — G8929 Other chronic pain: Secondary | ICD-10-CM | POA: Diagnosis not present

## 2023-02-25 DIAGNOSIS — R262 Difficulty in walking, not elsewhere classified: Secondary | ICD-10-CM | POA: Insufficient documentation

## 2023-02-25 NOTE — Progress Notes (Signed)
Patient presents today for laser maintenance treatment. Diagnosed with mycotic nail infection by Dr. Allena Katz.   All other systems are negative.  Nails were filed thin. Laser therapy was administered to 1-5 toenails bilateral and patient tolerated the treatment well. All safety precautions were in place.   Since patient has had multiple rounds of laser, I recommended laser maintenance every 3 months for preventative. She will follow up in 3 months.

## 2023-02-25 NOTE — Progress Notes (Signed)
error 

## 2023-03-07 ENCOUNTER — Telehealth: Payer: Self-pay | Admitting: Internal Medicine

## 2023-03-07 NOTE — Telephone Encounter (Signed)
Spoke with patient today.  Tested positive on yesterday.  Right now symptoms are mild: no cough, just congestion and few chills.  She will treat with over the counter meds and follow up if not better.

## 2023-03-07 NOTE — Telephone Encounter (Signed)
Pt tested postive for Covid and pt wanting to know if she need to take an antiviral medication. Please advise.

## 2023-03-11 ENCOUNTER — Ambulatory Visit (HOSPITAL_COMMUNITY): Payer: Medicare PPO

## 2023-03-18 ENCOUNTER — Encounter (HOSPITAL_COMMUNITY): Payer: Self-pay

## 2023-03-18 ENCOUNTER — Ambulatory Visit (HOSPITAL_COMMUNITY): Payer: Medicare PPO | Attending: Physician Assistant

## 2023-03-18 DIAGNOSIS — M25562 Pain in left knee: Secondary | ICD-10-CM | POA: Diagnosis not present

## 2023-03-18 DIAGNOSIS — G8929 Other chronic pain: Secondary | ICD-10-CM | POA: Insufficient documentation

## 2023-03-18 DIAGNOSIS — R262 Difficulty in walking, not elsewhere classified: Secondary | ICD-10-CM | POA: Diagnosis not present

## 2023-03-18 NOTE — Therapy (Signed)
OUTPATIENT PHYSICAL THERAPY LOWER EXTREMITY TREATMENT   Patient Name: [name] [name] MRN: [id number] DOB:06/05/1941, 82 y.o., female Today's Date: 03/18/2023  END OF SESSION:  PT End of Session - 03/18/23 1048     Visit Number 2    Number of Visits 6    Date for PT Re-Evaluation 04/08/23    Authorization Type [employer]    Authorization Time Period cohere approved 6 visits and 1 re-eval from 02/25/2023-04/08/2023    Authorization - Visit Number 1    Authorization - Number of Visits 6    Progress Note Due on Visit 7    PT Start Time 1004    PT Stop Time 1047    PT Time Calculation (min) 43 min    Activity Tolerance Patient tolerated treatment well    Behavior During Therapy WFL for tasks assessed/performed              Past Medical History:  Diagnosis Date   Allergic rhinitis    takes Benadryl at bedtime   Allergy    Anemia    Angio-edema    Arthritis    osteoarthritis   Atrial fibrillation (HCC) 06-10-2011   takes Diltiazem and Pradaxa daily   Cancer (HCC)    skin   Cataract    bilateral cataracts removed   Clotting disorder (HCC)    on PRADAXA - chronic a fib   Collagenous colitis    recurrent, takes Budesonide daily as needed    Colon polyps 01/14/2010   Hyperplastic   Cough    states every day of her life and every chest xray is always clear   GERD (gastroesophageal reflux disease)    takes Omeprazole daily   History of poliomyelitis    Polio  age 54- no significant neuromuscular deficit   Hyperlipidemia    was on Pravastatin but joint pain;has been off for 2-42months ;takes CoQ10   Hypertension    takes Bisoprolol and Chlorthalidone daily   Potassium deficiency    takes KDUR daily   Past Surgical History:  Procedure Laterality Date   ADENOIDECTOMY     APPENDECTOMY     at age 76   BREAST BIOPSY  1947 & 10/07   Benign   CARDIOVERSION N/A 11/05/2016   Procedure: CARDIOVERSION;  Surgeon: Quintella Reichert, MD;  Location: [facility];  Service:  Cardiovascular;  Laterality: N/A;   cataracts removed     COLONOSCOPY     ENDOVENOUS ABLATION SAPHENOUS VEIN W/ LASER  03-03-2011 left greater saphenous vein    ENDOVENOUS ABLATION SAPHENOUS VEIN W/ LASER  01-06-2011  right greater saphenous vein   epidural infection     x 2    IR RADIOLOGIST EVAL & MGMT  03/07/2017   SKIN CANCER EXCISION     removal of skin cancer on left leg   TONSILLECTOMY     as a child   TONSILLECTOMY     TOTAL KNEE ARTHROPLASTY Right 09/18/2013   Procedure: TOTAL KNEE ARTHROPLASTY;  Surgeon: Valeria Batman, MD;  Location: [facility] OR;  Service: Orthopedics;  Laterality: Right;   Patient Active Problem List   Diagnosis Date Noted   Anemia 01/28/2022   Osteopenia 01/28/2022   Allergy to alpha-gal 01/28/2022   Sinus complaint 01/28/2022   B12 deficiency 01/28/2022   Vitamin D deficiency 01/28/2022   Aortic atherosclerosis (HCC) 11/19/2020   Bladder prolapse, female, acquired 11/12/2019   Chronic sinusitis 12/06/2018   Hyperglycemia 01/03/2017   DDD (degenerative disc disease), lumbar 01/03/2017  Chronic back pain 01/02/2017   Bilateral leg edema 04/13/2016   Fatigue 05/29/2015   Osteoarthritis of right knee 09/21/2013   S/P total knee replacement using cement 09/18/2013   Anisocoria 07/26/2013   Varicose veins of bilateral lower extremities with other complications 09/02/2011   Paroxysmal Atrial Fibrillation 06/29/2011   COUGH, CHRONIC 03/09/2010   HIP PAIN, BILATERAL 07/17/2009   ALLERGIC RHINITIS 04/23/2008   GERD 11/01/2007   Essential hypertension 09/20/2007   SKIN CANCER, LEG 07/03/2007   Hyperlipidemia 07/03/2007   Colitis, collagenous 07/03/2007    PCP: Pincus Sanes MD  REFERRING PROVIDER: [place]ons, [place], [place]  REFERRING DIAG: [phone] (ICD-10-CM) - Acute pain of left knee  THERAPY DIAG:  Chronic pain of left knee  Difficulty in walking, not elsewhere classified  Rationale for Evaluation and Treatment: Rehabilitation  ONSET DATE:  over a month ago  SUBJECTIVE:   SUBJECTIVE STATEMENT: 03/18/23:  She caught COVID during family beach trip with overall fatigue and weakness.  Reports she is not back to normal.  Reports she hasn't been as compliant with HEP due to sickness though has done a few exercises while at the beach and some at home.  No reports of knee pain currently, does have some burning back pain.    Eval:  Saw PCP for check up; told her PCP about left knee pain and she sent her therapy.  Had an x-ray showing arthritic knee.  Some days it doesn't hurt too bad; increased with prolonged standing, walking. Swelling left knee; wearing bilateral compression stocking.  Has to pause before walking if she sits too long  PERTINENT HISTORY: R TKA Dr. Cleophas Dunker 2015 On eliquis for  Polio as a teenager Back pain PAIN:  Are you having pain? Yes: NPRS scale: 7 to 8 at worse today 2/10 Pain location: left knee Pain description: tight, sore, aching, burning Aggravating factors: standing, walking Relieving factors: sitting, lying down  PRECAUTIONS: Fall  WEIGHT BEARING RESTRICTIONS: No  FALLS:  Has patient fallen in last 6 months? 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  She does  have advanced tricompartmental arthritis of the knee but no acute  fractures.  Of note she is status post right total knee arthroplasty in  good position    PATIENT SURVEYS:  FOTO 56  COGNITION: Overall cognitive status: Within functional limits for  tasks assessed     SENSATION: N/T in feet and toes  EDEMA:  Swelling noted left knee versus right  POSTURE: rounded shoulders and forward head  PALPATION: Swelling left knee  LOWER EXTREMITY ROM:  Active ROM Right eval Left eval  Hip flexion    Hip extension    Hip abduction    Hip adduction    Hip internal rotation    Hip external rotation    Knee flexion 115 107  Knee extension 0 -7  Ankle dorsiflexion    Ankle plantarflexion    Ankle inversion    Ankle eversion     (Blank rows = not tested)  LOWER EXTREMITY MMT:  MMT Right eval Left eval  Hip flexion 4+ 4-  Hip extension    Hip abduction    Hip adduction    Hip internal rotation    Hip external rotation    Knee flexion    Knee extension 4+ 4  Ankle dorsiflexion 5 5  Ankle plantarflexion    Ankle inversion    Ankle eversion     (Blank rows = not tested)  FUNCTIONAL TESTS:  5 times sit to stand: 18.44 sec using hands to assist  GAIT: Distance walked: 50 ft in clinic Assistive device utilized: None Level of assistance: Modified independence Comments: takes one misstep walking in clinic   TODAY'S TREATMENT:                                                                                                                              DATE:  03/18/23:   Reviewed goals Educated importance of HEP compliance  Seated: -LAQ 10x 5" -STS with UE support 10x cueing for mechanics  -Educated log rolling Supine: -Quad sets 10x5" -Knee extension with ankle prop on towel -SAQ 10x5" -Bridge -SLR  Standing: SLS Lt 5", Rt 1-2" max  02/24/23 physical therapy evaluation and HEP instruction    PATIENT EDUCATION:  Education details: Patient educated on exam findings, POC, scope of PT, HEP, and what to expect next visit. Person educated: Patient Education method: Explanation, Demonstration, and Handouts Education comprehension: verbalized understanding, returned demonstration, verbal cues required, and  tactile cues required  HOME EXERCISE PROGRAM: Access Code: [email or web address] URL: [email or web address] Date: 02/25/2023 Prepared by: [facility] - Rehab  Exercises - Supine Knee Extension Stretch on Towel Roll  - 2 x daily - 7 x weekly - 1 sets - 1 reps - 5 min hold - Supine Quad Set  - 2 x daily - 7 x weekly - 1 sets - 10 reps - Sit to Stand with Armchair  - 2 x daily - 7 x weekly - 1 sets - 5 reps - Seated Long Arc Quad  - 2 x daily - 7 x weekly - 1 sets - 10 reps - 2 sec hold  ASSESSMENT:  CLINICAL IMPRESSION: 03/18/23:  Reviewed goals and educated importance of HEP compliance.  Pt able to recall with min cueing to improve form with current exercise program.  Session focus with knee mobility and quad/gluteal strengthening with additional exercises.  Pt able to complete all exercises with proper form, does present with weakness noted by partial raise with bridges, need for UE support with STS and visible musculature fatigue with new exercises.  Impaired SLS with increased difficulty.  Eval:  Patient is a 82 y.o. female who was seen today for physical therapy evaluation and treatment for left knee pain. Patient demonstrates muscle weakness, reduced ROM, and fascial restrictions which are likely contributing to symptoms of pain and are negatively impacting patient ability to perform ADLs and functional mobility tasks. Patient will benefit from  skilled physical therapy services to address these deficits to reduce pain and improve level of function with ADLs and functional mobility tasks.   OBJECTIVE IMPAIRMENTS: Abnormal gait, decreased activity tolerance, decreased balance, decreased mobility, difficulty walking, decreased ROM, decreased strength, hypomobility, increased edema, increased fascial restrictions, impaired perceived functional ability, and pain.   ACTIVITY LIMITATIONS: carrying, lifting, bending, sitting, standing, squatting, sleeping, stairs, transfers, locomotion level, and caring  for others  PARTICIPATION LIMITATIONS: meal prep, cleaning, laundry, and community activity   REHAB POTENTIAL: Good  CLINICAL DECISION MAKING: Stable/uncomplicated  EVALUATION COMPLEXITY: Low   GOALS: Goals reviewed with patient? No  SHORT TERM GOALS: Target date: 03/18/2023 patient will be independent with initial HEP   Baseline: Goal status: IN PROGRESS  2.  Patient will self report 30% improvement to improve tolerance for functional activity  Baseline:  Goal status: IN PROGRESS   LONG TERM GOALS: Target date: 04/08/2023  Patient will be independent in self management strategies to improve quality of life and functional outcomes.  Baseline:  Goal status: IN PROGRESS  2.  Patient will self report 50% improvement to improve tolerance for functional activity  Baseline:  Goal status: IN PROGRESS  3.  Patient will increased left knee mobility to 0 to 115 to promote normal navigation of steps; step over step pattern  Baseline: see above Goal status: IN PROGRESS  4.  Patient will increase left  leg MMTs to 5/5 without pain to promote return to ambulation community distances with minimal deviation.  Baseline: see above Goal status: IN PROGRESS  5.  Patient will improve FOTO score by 10 points Baseline: 56 Goal status: IN PROGRESS    PLAN:  PT FREQUENCY: 1x/week  PT DURATION: 6 weeks  PLANNED INTERVENTIONS: Therapeutic exercises, Therapeutic activity, Neuromuscular re-education, Balance training, Gait training, Patient/Family education, Joint manipulation, Joint mobilization, Stair training, Orthotic/Fit training, DME instructions, Aquatic Therapy, Dry Needling, Electrical stimulation, Spinal manipulation, Spinal mobilization, Cryotherapy, Moist heat, Compression bandaging, scar mobilization, Splintting, Taping, Traction, Ultrasound, Ionotophoresis 4mg /ml Dexamethasone, and Manual therapy   PLAN FOR NEXT SESSION: Progress left knee mobility and strength,  balance  Becky Sax, LPTA/CLT; CBIS [phone]  Juel Burrow, PTA 03/18/2023, 3:38 PM  3:36 PM, 03/18/23

## 2023-03-25 ENCOUNTER — Ambulatory Visit (HOSPITAL_COMMUNITY): Payer: Medicare PPO

## 2023-03-25 DIAGNOSIS — R262 Difficulty in walking, not elsewhere classified: Secondary | ICD-10-CM

## 2023-03-25 DIAGNOSIS — M25562 Pain in left knee: Secondary | ICD-10-CM | POA: Diagnosis not present

## 2023-03-25 DIAGNOSIS — G8929 Other chronic pain: Secondary | ICD-10-CM

## 2023-03-25 NOTE — Therapy (Signed)
OUTPATIENT PHYSICAL THERAPY LOWER EXTREMITY TREATMENT   Patient Name: [name] [name] MRN: [id number] DOB:1941/05/02, 82 y.o., female Today's Date: 03/25/2023  END OF SESSION:  PT End of Session - 03/25/23 1253     Visit Number 3    Number of Visits 6    Date for PT Re-Evaluation 04/08/23    Authorization Type [employer]    Authorization Time Period cohere approved 6 visits and 1 re-eval from 02/25/2023-04/08/2023    Authorization - Visit Number 2    Authorization - Number of Visits 6    Progress Note Due on Visit 7    PT Start Time 1258    PT Stop Time 1340    PT Time Calculation (min) 42 min    Activity Tolerance Patient tolerated treatment well    Behavior During Therapy WFL for tasks assessed/performed              Past Medical History:  Diagnosis Date   Allergic rhinitis    takes Benadryl at bedtime   Allergy    Anemia    Angio-edema    Arthritis    osteoarthritis   Atrial fibrillation (HCC) 06-10-2011   takes Diltiazem and Pradaxa daily   Cancer (HCC)    skin   Cataract    bilateral cataracts removed   Clotting disorder (HCC)    on PRADAXA - chronic a fib   Collagenous colitis    recurrent, takes Budesonide daily as needed    Colon polyps 01/14/2010   Hyperplastic   Cough    states every day of her life and every chest xray is always clear   GERD (gastroesophageal reflux disease)    takes Omeprazole daily   History of poliomyelitis    Polio  age 48- no significant neuromuscular deficit   Hyperlipidemia    was on Pravastatin but joint pain;has been off for 2-13months ;takes CoQ10   Hypertension    takes Bisoprolol and Chlorthalidone daily   Potassium deficiency    takes KDUR daily   Past Surgical History:  Procedure Laterality Date   ADENOIDECTOMY     APPENDECTOMY     at age 60   BREAST BIOPSY  1947 & 10/07   Benign   CARDIOVERSION N/A 11/05/2016   Procedure: CARDIOVERSION;  Surgeon: Quintella Reichert, MD;  Location: [facility];  Service:  Cardiovascular;  Laterality: N/A;   cataracts removed     COLONOSCOPY     ENDOVENOUS ABLATION SAPHENOUS VEIN W/ LASER  03-03-2011 left greater saphenous vein    ENDOVENOUS ABLATION SAPHENOUS VEIN W/ LASER  01-06-2011  right greater saphenous vein   epidural infection     x 2    IR RADIOLOGIST EVAL & MGMT  03/07/2017   SKIN CANCER EXCISION     removal of skin cancer on left leg   TONSILLECTOMY     as a child   TONSILLECTOMY     TOTAL KNEE ARTHROPLASTY Right 09/18/2013   Procedure: TOTAL KNEE ARTHROPLASTY;  Surgeon: Valeria Batman, MD;  Location: [facility];  Service: Orthopedics;  Laterality: Right;   Patient Active Problem List   Diagnosis Date Noted   Anemia 01/28/2022   Osteopenia 01/28/2022   Allergy to alpha-gal 01/28/2022   Sinus complaint 01/28/2022   B12 deficiency 01/28/2022   Vitamin D deficiency 01/28/2022   Aortic atherosclerosis (HCC) 11/19/2020   Bladder prolapse, female, acquired 11/12/2019   Chronic sinusitis 12/06/2018   Hyperglycemia 01/03/2017   DDD (degenerative disc disease), lumbar 01/03/2017  Chronic back pain 01/02/2017   Bilateral leg edema 04/13/2016   Fatigue 05/29/2015   Osteoarthritis of right knee 09/21/2013   S/P total knee replacement using cement 09/18/2013   Anisocoria 07/26/2013   Varicose veins of bilateral lower extremities with other complications 09/02/2011   Paroxysmal Atrial Fibrillation 06/29/2011   COUGH, CHRONIC 03/09/2010   HIP PAIN, BILATERAL 07/17/2009   ALLERGIC RHINITIS 04/23/2008   GERD 11/01/2007   Essential hypertension 09/20/2007   SKIN CANCER, LEG 07/03/2007   Hyperlipidemia 07/03/2007   Colitis, collagenous 07/03/2007    PCP: Pincus Sanes MD  REFERRING PROVIDER: Persons, [place], [place]  REFERRING DIAG: [phone] (ICD-10-CM) - Acute pain of left knee  THERAPY DIAG:  Chronic pain of left knee  Difficulty in walking, not elsewhere classified  Rationale for Evaluation and Treatment: Rehabilitation  ONSET DATE:  over a month ago  SUBJECTIVE:   SUBJECTIVE STATEMENT: Had a rough start to therapy due catching COVD and "still feel like I'm recovering".   2/10 left knee soreness today.  Patient reports most pain with turning a certain way in bed.    Eval:  Saw PCP for check up; told her PCP about left knee pain and she sent her therapy.  Had an x-ray showing arthritic knee.  Some days it doesn't hurt too bad; increased with prolonged standing, walking. Swelling left knee; wearing bilateral compression stocking.  Has to pause before walking if she sits too long  PERTINENT HISTORY: R TKA Dr. Cleophas Dunker 2015 On eliquis for  Polio as a teenager Back pain PAIN:  Are you having pain? Yes: NPRS scale: 7 to 8 at worse today 2/10 Pain location: left knee Pain description: tight, sore, aching, burning Aggravating factors: standing, walking Relieving factors: sitting, lying down  PRECAUTIONS: Fall  WEIGHT BEARING RESTRICTIONS: No  FALLS:  Has patient fallen in last 6 months? 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  She does  have advanced tricompartmental arthritis of the knee but no acute  fractures.  Of note she is status post right total knee arthroplasty in  good position    PATIENT SURVEYS:  FOTO 56  COGNITION: Overall cognitive status: Within functional limits for tasks assessed     SENSATION: N/T in feet and toes  EDEMA:  Swelling noted left knee versus right  POSTURE: rounded shoulders and forward  head  PALPATION: Swelling left knee  LOWER EXTREMITY ROM:  Active ROM Right eval Left eval Left 03/25/23  Hip flexion     Hip extension     Hip abduction     Hip adduction     Hip internal rotation     Hip external rotation     Knee flexion 115 107 118  Knee extension 0 -7 -5  Ankle dorsiflexion     Ankle plantarflexion     Ankle inversion     Ankle eversion      (Blank rows = not tested)  LOWER EXTREMITY MMT:  MMT Right eval Left eval  Hip flexion 4+  4-  Hip extension    Hip abduction    Hip adduction    Hip internal rotation    Hip external rotation    Knee flexion    Knee extension 4+ 4  Ankle dorsiflexion 5 5  Ankle plantarflexion    Ankle inversion    Ankle eversion     (Blank rows = not tested)  FUNCTIONAL TESTS:  5 times sit to stand: 18.44 sec using hands to assist  GAIT: Distance walked: 50 ft in clinic Assistive device utilized: None Level of assistance: Modified independence Comments: takes one misstep walking in clinic   TODAY'S TREATMENT:                                                                                                                              DATE:  03/25/23 Nustep seat 9 x 5' dynamic warm up  Supine: Left quad set 5" hold x 10 Heel slides x 10 2# SAQ's 2 x 10 SLR x 10 Left knee extension stretch x 2' with towel roll under heel  Left knee flexion today -5 to 118  Sitting: LAQ's 2 x 10  Sit to stand x 5 using hands to assist; x 5 without hands with mat slightly elevated TKE with GTB x 15 reps   03/18/23:   Reviewed goals Educated importance of HEP compliance  Seated: -LAQ 10x 5" -STS with UE support 10x cueing for mechanics  -Educated log rolling Supine: -Quad sets 10x5" -Knee extension with ankle prop on towel -SAQ 10x5" -Bridge -SLR  Standing: SLS Lt 5", Rt 1-2" max  02/24/23 physical therapy evaluation and HEP instruction    PATIENT EDUCATION:  Education details: Patient educated on exam  findings, POC, scope of PT, HEP, and what to expect next visit. Person educated: Patient Education method: Explanation, Demonstration, and Handouts Education comprehension: verbalized understanding, returned demonstration, verbal cues required, and tactile cues required  HOME EXERCISE PROGRAM: 03/25/23 sit to stand, SLS Access Code: [email or web address] URL: [email or web address] Date: 02/25/2023 Prepared by: [facility] - Rehab  Exercises - Supine Knee Extension Stretch on Towel Roll  - 2 x daily - 7 x weekly - 1 sets - 1 reps - 5 min hold - Supine Quad Set  - 2 x daily - 7 x weekly - 1 sets - 10 reps - Sit to Stand with Armchair  - 2 x daily - 7 x weekly - 1 sets - 5 reps - Seated Long Arc Quad  - 2 x daily - 7 x weekly - 1 sets - 10 reps - 2 sec hold  ASSESSMENT:  CLINICAL IMPRESSION: 03/25/23 started with nustep for a warm up as patient reports she has knee stiffness to start.  Added weight to SAQ's for increased challenge.  Demonstrates improved left knee mobility today.  Noted continued swelling of the left lower leg > right.  Noted crepitus Left knee with sit to stand; has to use hands to boost up to  standing; able to perform without hands with mat slightly elevated.  Patient will benefit from continued skilled therapy services during the remainder of her hospital stay and at the next recommended venue of care to address deficits and promote return to optimal function.      Eval:  Patient is a 82 y.o. female who was seen today for physical therapy evaluation and treatment for left knee pain. Patient demonstrates muscle weakness, reduced ROM, and fascial restrictions which are likely contributing to symptoms of pain and are negatively impacting patient ability to perform ADLs and functional mobility tasks. Patient will benefit from skilled physical therapy services to address these deficits to reduce pain and improve level of function with ADLs and functional mobility tasks.   OBJECTIVE  IMPAIRMENTS: Abnormal gait, decreased activity tolerance, decreased balance, decreased mobility, difficulty walking, decreased ROM, decreased strength, hypomobility, increased edema, increased fascial restrictions, impaired perceived functional ability, and pain.   ACTIVITY LIMITATIONS: carrying, lifting, bending, sitting, standing, squatting, sleeping, stairs, transfers, locomotion level, and caring for others  PARTICIPATION LIMITATIONS: meal prep, cleaning, laundry, and community activity   REHAB POTENTIAL: Good  CLINICAL DECISION MAKING: Stable/uncomplicated  EVALUATION COMPLEXITY: Low   GOALS: Goals reviewed with patient? No  SHORT TERM GOALS: Target date: 03/18/2023 patient will be independent with initial HEP   Baseline: Goal status: MET  2.  Patient will self report 30% improvement to improve tolerance for functional activity  Baseline:  Goal status: IN PROGRESS   LONG TERM GOALS: Target date: 04/08/2023  Patient will be independent in self management strategies to improve quality of life and functional outcomes.  Baseline:  Goal status: IN PROGRESS  2.  Patient will self report 50% improvement to improve tolerance for functional activity  Baseline:  Goal status: IN PROGRESS  3.  Patient will increased left knee mobility to 0 to 115 to promote normal navigation of steps; step over step pattern  Baseline: see above; knee flexion 118 03/25/23 Goal status: In progress  4.  Patient will increase left  leg MMTs to 5/5 without pain to promote return to ambulation community distances with minimal deviation.  Baseline: see above Goal status: IN PROGRESS  5.  Patient will improve FOTO score by 10 points Baseline: 56 Goal status: IN PROGRESS    PLAN:  PT FREQUENCY: 1x/week  PT DURATION: 6 weeks  PLANNED INTERVENTIONS: Therapeutic exercises, Therapeutic activity, Neuromuscular re-education, Balance training, Gait training, Patient/Family education, Joint  manipulation, Joint mobilization, Stair training, Orthotic/Fit training, DME instructions, Aquatic Therapy, Dry Needling, Electrical stimulation, Spinal manipulation, Spinal mobilization, Cryotherapy, Moist heat, Compression bandaging, scar mobilization, Splintting, Taping, Traction, Ultrasound, Ionotophoresis 4mg /ml Dexamethasone, and Manual therapy   PLAN FOR NEXT SESSION: Progress left knee mobility and strength, balance  1:43 PM, 03/25/23 Emani Morad Small Marie Borowski MPT [place] [facility] [place] [phone] Ph:[phone]

## 2023-04-01 ENCOUNTER — Ambulatory Visit (HOSPITAL_COMMUNITY): Payer: Medicare PPO

## 2023-04-01 DIAGNOSIS — R262 Difficulty in walking, not elsewhere classified: Secondary | ICD-10-CM

## 2023-04-01 DIAGNOSIS — G8929 Other chronic pain: Secondary | ICD-10-CM | POA: Diagnosis not present

## 2023-04-01 DIAGNOSIS — M25562 Pain in left knee: Secondary | ICD-10-CM | POA: Diagnosis not present

## 2023-04-01 NOTE — Therapy (Signed)
OUTPATIENT PHYSICAL THERAPY LOWER EXTREMITY TREATMENT   Patient Name: [name] [name] MRN: [id number] DOB:[birth year], 82 y.o., female Today's Date: 04/01/2023  END OF SESSION:  PT End of Session - 04/01/23 0816     Visit Number 4    Number of Visits 6    Date for PT Re-Evaluation 04/08/23    Authorization Type [employer]    Authorization Time Period cohere approved 6 visits and 1 re-eval from 02/25/2023-04/08/2023    Authorization - Visit Number 3    Authorization - Number of Visits 6    Progress Note Due on Visit 7    PT Start Time 0816    PT Stop Time 0856    PT Time Calculation (min) 40 min    Activity Tolerance Patient tolerated treatment well    Behavior During Therapy [facility] for tasks assessed/performed              Past Medical History:  Diagnosis Date   Allergic rhinitis    takes Benadryl at bedtime   Allergy    Anemia    Angio-edema    Arthritis    osteoarthritis   Atrial fibrillation (HCC) 06-10-2011   takes Diltiazem and Pradaxa daily   Cancer (HCC)    skin   Cataract    bilateral cataracts removed   Clotting disorder (HCC)    on PRADAXA - chronic a fib   Collagenous colitis    recurrent, takes Budesonide daily as needed    Colon polyps 01/14/2010   Hyperplastic   Cough    states every day of her life and every chest xray is always clear   GERD (gastroesophageal reflux disease)    takes Omeprazole daily   History of poliomyelitis    Polio  age 4- no significant neuromuscular deficit   Hyperlipidemia    was on Pravastatin but joint pain;has been off for 2-66months ;takes CoQ10   Hypertension    takes Bisoprolol and Chlorthalidone daily   Potassium deficiency    takes KDUR daily   Past Surgical History:  Procedure Laterality Date   ADENOIDECTOMY     APPENDECTOMY     at age 54   BREAST BIOPSY  1947 & 10/07   Benign   CARDIOVERSION N/A 11/05/2016   Procedure: CARDIOVERSION;  Surgeon: Quintella Reichert, MD;  Location: [facility];  Service:  Cardiovascular;  Laterality: N/A;   cataracts removed     COLONOSCOPY     ENDOVENOUS ABLATION SAPHENOUS VEIN W/ LASER  03-03-2011 left greater saphenous vein    ENDOVENOUS ABLATION SAPHENOUS VEIN W/ LASER  01-06-2011  right greater saphenous vein   epidural infection     x 2    IR RADIOLOGIST EVAL & MGMT  03/07/2017   SKIN CANCER EXCISION     removal of skin cancer on left leg   TONSILLECTOMY     as a child   TONSILLECTOMY     TOTAL KNEE ARTHROPLASTY Right 09/18/2013   Procedure: TOTAL KNEE ARTHROPLASTY;  Surgeon: Valeria Batman, MD;  Location: [facility];  Service: Orthopedics;  Laterality: Right;   Patient Active Problem List   Diagnosis Date Noted   Anemia 01/28/2022   Osteopenia 01/28/2022   Allergy to alpha-gal 01/28/2022   Sinus complaint 01/28/2022   B12 deficiency 01/28/2022   Vitamin D deficiency 01/28/2022   Aortic atherosclerosis (HCC) 11/19/2020   Bladder prolapse, female, acquired 11/12/2019   Chronic sinusitis 12/06/2018   Hyperglycemia 01/03/2017   DDD (degenerative disc disease), lumbar 01/03/2017  Chronic back pain 01/02/2017   Bilateral leg edema 04/13/2016   Fatigue 05/29/2015   Osteoarthritis of right knee 09/21/2013   S/P total knee replacement using cement 09/18/2013   Anisocoria 07/26/2013   Varicose veins of bilateral lower extremities with other complications 09/02/2011   Paroxysmal Atrial Fibrillation 06/29/2011   COUGH, CHRONIC 03/09/2010   HIP PAIN, BILATERAL 07/17/2009   ALLERGIC RHINITIS 04/23/2008   GERD 11/01/2007   Essential hypertension 09/20/2007   SKIN CANCER, LEG 07/03/2007   Hyperlipidemia 07/03/2007   Colitis, collagenous 07/03/2007    PCP: Pincus Sanes MD  REFERRING PROVIDER: Persons, [place], [place]  REFERRING DIAG: [phone] (ICD-10-CM) - Acute pain of left knee  THERAPY DIAG:  Chronic pain of left knee  Difficulty in walking, not elsewhere classified  Rationale for Evaluation and Treatment: Rehabilitation  ONSET DATE:  over a month ago  SUBJECTIVE:   SUBJECTIVE STATEMENT: Reports left knee is popping this morning; has pain lateral and medial joint line this morning; has pain with SLR; can get up from 20" chair but not an 18" chair.    Eval:  Saw PCP for check up; told her PCP about left knee pain and she sent her therapy.  Had an x-ray showing arthritic knee.  Some days it doesn't hurt too bad; increased with prolonged standing, walking. Swelling left knee; wearing bilateral compression stocking.  Has to pause before walking if she sits too long  PERTINENT HISTORY: R TKA Dr. Cleophas Dunker 2015 On eliquis for  Polio as a teenager Back pain PAIN:  Are you having pain? Yes: NPRS scale: 7 to 8 at worse today 2/10 Pain location: left knee Pain description: tight, sore, aching, burning Aggravating factors: standing, walking Relieving factors: sitting, lying down  PRECAUTIONS: Fall  WEIGHT BEARING RESTRICTIONS: No  FALLS:  Has patient fallen in last 6 months? 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  She does  have advanced tricompartmental arthritis of the knee but no acute  fractures.  Of note she is status post right total knee arthroplasty in  good position    PATIENT SURVEYS:  FOTO 56  COGNITION: Overall cognitive status: Within functional limits for tasks assessed     SENSATION: N/T in feet and toes  EDEMA:  Swelling noted left knee versus right  POSTURE: rounded shoulders and forward  head  PALPATION: Swelling left knee  LOWER EXTREMITY ROM:  Active ROM Right eval Left eval Left 03/25/23  Hip flexion     Hip extension     Hip abduction     Hip adduction     Hip internal rotation     Hip external rotation     Knee flexion 115 107 118  Knee extension 0 -7 -5  Ankle dorsiflexion     Ankle plantarflexion     Ankle inversion     Ankle eversion      (Blank rows = not tested)  LOWER EXTREMITY MMT:  MMT Right eval Left eval  Hip flexion 4+ 4-  Hip  extension    Hip abduction    Hip adduction    Hip internal rotation    Hip external rotation    Knee flexion    Knee extension 4+ 4  Ankle dorsiflexion 5 5  Ankle plantarflexion    Ankle inversion    Ankle eversion     (Blank rows = not tested)  FUNCTIONAL TESTS:  5 times sit to stand: 18.44 sec using hands to assist  GAIT: Distance walked: 50 ft in clinic Assistive device utilized: None Level of assistance: Modified independence Comments: takes one misstep walking in clinic   TODAY'S TREATMENT:                                                                                                                              DATE:  04/01/23 Nustep seat 9 x 5' dynamic warm up  Standing: Heel raises 2 x 10 12" box knee drives for flexion x 2' Hip vectors x 8 each GTB TKE's x 20  Sitting: LAQ's 2# 2 x 10  03/25/23 Nustep seat 9 x 5' dynamic warm up  Supine: Left quad set 5" hold x 10 Heel slides x 10 2# SAQ's 2 x 10 SLR x 10 Left knee extension stretch x 2' with towel roll under heel  Left knee flexion today -5 to 118  Sitting: LAQ's 2 x 10  Sit to stand x 5 using hands to assist; x 5 without hands with mat slightly elevated TKE with GTB x 15 reps   03/18/23:   Reviewed goals Educated importance of HEP compliance  Seated: -LAQ 10x 5" -STS with UE support 10x cueing for mechanics  -Educated log rolling Supine: -Quad sets 10x5" -Knee extension with ankle prop on towel -SAQ  10x5" -Bridge -SLR  Standing: SLS Lt 5", Rt 1-2" max  02/24/23 physical therapy evaluation and HEP instruction    PATIENT EDUCATION:  Education details: Patient educated on exam findings, POC, scope of PT, HEP, and what to expect next visit. Person educated: Patient Education method: Explanation, Demonstration, and Handouts Education comprehension: verbalized understanding, returned demonstration, verbal cues required, and tactile cues required  HOME EXERCISE PROGRAM: 04/01/23 hip vectors, heel raises, knee flexion on step 03/25/23 sit to stand, SLS Access Code: [email or web address] URL: [email or web address] Date: 02/25/2023 Prepared by: [facility] - Rehab  Exercises - Supine Knee Extension Stretch on Towel Roll  - 2 x daily - 7 x weekly - 1 sets - 1 reps - 5 min hold - Supine Quad Set  - 2 x daily - 7 x weekly - 1 sets - 10 reps - Sit to Stand with Armchair  - 2 x daily - 7 x weekly - 1 sets - 5 reps - Seated Long Arc Quad  - 2 x daily - 7 x weekly - 1 sets - 10 reps - 2 sec hold  ASSESSMENT:  CLINICAL IMPRESSION: Today's session continued with knee mobility and strengthening. Progressed to standing exercises and  updated HEP with standing exercises; patient does have some increased pain in standing due to increased compression on the knee joint. Discussed with her possibility of injections to help with lubrication of the knee joint.  Patient will benefit from continued skilled therapy services to address deficits and promote return to optimal function.      Eval:  Patient is a 82 y.o. female who was seen today for physical therapy evaluation and treatment for left knee pain. Patient demonstrates muscle weakness, reduced ROM, and fascial restrictions which are likely contributing to symptoms of pain and are negatively impacting patient ability to perform ADLs and functional mobility tasks. Patient will benefit from skilled physical therapy services to address these deficits to reduce pain  and improve level of function with ADLs and functional mobility tasks.   OBJECTIVE IMPAIRMENTS: Abnormal gait, decreased activity tolerance, decreased balance, decreased mobility, difficulty walking, decreased ROM, decreased strength, hypomobility, increased edema, increased fascial restrictions, impaired perceived functional ability, and pain.   ACTIVITY LIMITATIONS: carrying, lifting, bending, sitting, standing, squatting, sleeping, stairs, transfers, locomotion level, and caring for others  PARTICIPATION LIMITATIONS: meal prep, cleaning, laundry, and community activity   REHAB POTENTIAL: Good  CLINICAL DECISION MAKING: Stable/uncomplicated  EVALUATION COMPLEXITY: Low   GOALS: Goals reviewed with patient? No  SHORT TERM GOALS: Target date: 03/18/2023 patient will be independent with initial HEP   Baseline: Goal status: MET  2.  Patient will self report 30% improvement to improve tolerance for functional activity  Baseline:  Goal status: IN PROGRESS   LONG TERM GOALS: Target date: 04/08/2023  Patient will be independent in self management strategies to improve quality of life and functional outcomes.  Baseline:  Goal status: IN PROGRESS  2.  Patient will self report 50% improvement to improve tolerance for functional activity  Baseline:  Goal status: IN PROGRESS  3.  Patient will increased left knee mobility to 0 to 115 to promote normal navigation of steps; step over step pattern  Baseline: see above; knee flexion 118 03/25/23 Goal status: In progress  4.  Patient will increase left  leg MMTs to 5/5 without pain to promote return to ambulation community distances with minimal deviation.  Baseline: see above Goal status: IN PROGRESS  5.  Patient will improve FOTO score by 10 points Baseline: 56 Goal status: IN PROGRESS    PLAN:  PT FREQUENCY: 1x/week  PT DURATION: 6 weeks  PLANNED INTERVENTIONS: Therapeutic exercises, Therapeutic activity,  Neuromuscular re-education, Balance training, Gait training, Patient/Family education, Joint manipulation, Joint mobilization, Stair training, Orthotic/Fit training, DME instructions, Aquatic Therapy, Dry Needling, Electrical stimulation, Spinal manipulation, Spinal mobilization, Cryotherapy, Moist heat, Compression bandaging, scar mobilization, Splintting, Taping, Traction, Ultrasound, Ionotophoresis 4mg /ml Dexamethasone, and Manual therapy   PLAN FOR NEXT SESSION: reassess next visit 8:56 AM, 04/01/23 Roshaun Pound Small Karielle Davidow MPT [place] [facility]  [phone] Ph:[phone]

## 2023-04-04 DIAGNOSIS — Z1231 Encounter for screening mammogram for malignant neoplasm of breast: Secondary | ICD-10-CM | POA: Diagnosis not present

## 2023-04-04 LAB — HM MAMMOGRAPHY

## 2023-04-06 ENCOUNTER — Encounter: Payer: Self-pay | Admitting: Internal Medicine

## 2023-04-11 ENCOUNTER — Ambulatory Visit (HOSPITAL_COMMUNITY): Payer: Medicare PPO

## 2023-04-11 DIAGNOSIS — R262 Difficulty in walking, not elsewhere classified: Secondary | ICD-10-CM

## 2023-04-11 DIAGNOSIS — G8929 Other chronic pain: Secondary | ICD-10-CM

## 2023-04-11 DIAGNOSIS — M25562 Pain in left knee: Secondary | ICD-10-CM | POA: Diagnosis not present

## 2023-04-11 NOTE — Therapy (Signed)
OUTPATIENT PHYSICAL THERAPY LOWER EXTREMITY TREATMENT Progress Note Reporting Period 02/24/2023 to 04/11/2023  See note below for Objective Data and Assessment of Progress/Goals.  PHYSICAL THERAPY DISCHARGE SUMMARY  Visits from Start of Care: 5  Current functional level related to goals / functional outcomes: See below   Remaining deficits: See below   Education / Equipment: HEP   Patient agrees to discharge. Patient goals were partially met. Patient is being discharged due to being pleased with the current functional level.       Patient Name: [name] [name] MRN: [id number] DOB:[birth year], 82 y.o., female Today's Date: 04/11/2023  END OF SESSION:  PT End of Session - 04/11/23 1348     Visit Number 5    Number of Visits 6    Date for PT Re-Evaluation 04/08/23    Authorization Type [employer]    Authorization Time Period cohere approved 6 visits and 1 re-eval from 02/25/2023-04/08/2023    Authorization - Visit Number 4    Authorization - Number of Visits 6    Progress Note Due on Visit 7    PT Start Time 1346    PT Stop Time 1426    PT Time Calculation (min) 40 min    Activity Tolerance Patient tolerated treatment well    Behavior During Therapy [facility] for tasks assessed/performed              Past Medical History:  Diagnosis Date   Allergic rhinitis    takes Benadryl at bedtime   Allergy    Anemia    Angio-edema    Arthritis    osteoarthritis   Atrial fibrillation (HCC) 06-10-2011   takes Diltiazem and Pradaxa daily   Cancer (HCC)    skin   Cataract    bilateral cataracts removed   Clotting disorder (HCC)    on PRADAXA - chronic a fib   Collagenous colitis    recurrent, takes Budesonide daily as needed    Colon polyps 01/14/2010   Hyperplastic   Cough    states every day of her life and every chest xray is always clear   GERD (gastroesophageal reflux disease)    takes Omeprazole daily   History of poliomyelitis    Polio  age 82- no significant  neuromuscular deficit   Hyperlipidemia    was on Pravastatin but joint pain;has been off for 2-65months ;takes CoQ10   Hypertension    takes Bisoprolol and Chlorthalidone daily   Potassium deficiency    takes KDUR daily   Past Surgical History:  Procedure Laterality Date   ADENOIDECTOMY     APPENDECTOMY     at age 81   BREAST BIOPSY  1947 & 10/07   Benign   CARDIOVERSION N/A 11/05/2016   Procedure: CARDIOVERSION;  Surgeon: Quintella Reichert, MD;  Location: [facility];  Service: Cardiovascular;  Laterality: N/A;   cataracts removed     COLONOSCOPY     ENDOVENOUS ABLATION SAPHENOUS VEIN W/ LASER  03-03-2011 left greater saphenous vein    ENDOVENOUS ABLATION SAPHENOUS VEIN W/ LASER  01-06-2011  right greater saphenous vein   epidural infection     x 2    IR RADIOLOGIST EVAL & MGMT  03/07/2017   SKIN CANCER EXCISION     removal of skin cancer on left leg   TONSILLECTOMY     as a child   TONSILLECTOMY     TOTAL KNEE ARTHROPLASTY Right 09/18/2013   Procedure: TOTAL KNEE ARTHROPLASTY;  Surgeon: Valeria Batman, MD;  Location: [facility] OR;  Service: Orthopedics;  Laterality: Right;   Patient Active Problem List   Diagnosis Date Noted   Anemia 01/28/2022   Osteopenia 01/28/2022   Allergy to alpha-gal 01/28/2022   Sinus complaint 01/28/2022   B12 deficiency 01/28/2022   Vitamin D deficiency 01/28/2022   Aortic atherosclerosis (HCC) 11/19/2020   Bladder prolapse, female, acquired 11/12/2019   Chronic sinusitis 12/06/2018   Hyperglycemia 01/03/2017   DDD (degenerative disc disease), lumbar 01/03/2017   Chronic back pain 01/02/2017   Bilateral leg edema 04/13/2016   Fatigue 05/29/2015   Osteoarthritis of right knee 09/21/2013   S/P total knee replacement using cement 09/18/2013   Anisocoria 07/26/2013   Varicose veins of bilateral lower extremities with other complications 09/02/2011   Paroxysmal Atrial Fibrillation 06/29/2011   COUGH, CHRONIC 03/09/2010   HIP PAIN, BILATERAL 07/17/2009    ALLERGIC RHINITIS 04/23/2008   GERD 11/01/2007   Essential hypertension 09/20/2007   SKIN CANCER, LEG 07/03/2007   Hyperlipidemia 07/03/2007   Colitis, collagenous 07/03/2007    PCP: Pincus Sanes MD  REFERRING PROVIDER: Persons, [place], [place]  REFERRING DIAG: [phone] (ICD-10-CM) - Acute pain of left knee  THERAPY DIAG:  Chronic pain of left knee  Difficulty in walking, not elsewhere classified  Rationale for Evaluation and Treatment: Rehabilitation  ONSET DATE: over a month ago  SUBJECTIVE:   SUBJECTIVE STATEMENT: Did ok this week; visited with a friend out of town over the weekend.  Did have some pain last night on return from her trip; "all my joints hurt"' ; "50%" better   Eval:  Saw PCP for check up; told her PCP about left knee pain and she sent her therapy.  Had an x-ray showing arthritic knee.  Some days it doesn't hurt too bad; increased with prolonged standing, walking. Swelling left knee; wearing bilateral compression stocking.  Has to pause before walking if she sits too long  PERTINENT HISTORY: R TKA Dr. Cleophas Dunker 2015 On eliquis for  Polio as a teenager Back pain PAIN:  Are you having pain? Yes: NPRS scale: 7 to 8 at worse today 2/10 Pain location: left knee Pain description: tight, sore, aching, burning Aggravating factors: standing, walking Relieving factors: sitting, lying down  PRECAUTIONS: Fall  WEIGHT BEARING RESTRICTIONS: No  FALLS:  Has patient fallen in last 6 months? No  LIVING ENVIRONMENT: Lives with: lives alone Lives in: House/apartment Stairs: Yes: Internal: 12 steps; on left going up and External: 2 steps; on right going up Has following equipment at home: Single point cane, Walker - 4 wheeled, and shower chair  OCCUPATION: retired; also volunteers at a [employer]  PLOF: Independent  PATIENT GOALS: less pain when I'm on my feet a lot; better balance  NEXT MD VISIT: prn  OBJECTIVE:   DIAGNOSTIC FINDINGS:   Three-view radiographs of her left knee were obtained today.  She does  have advanced tricompartmental arthritis of the knee but no acute  fractures.  Of note she is status post right total knee arthroplasty in  good position    PATIENT SURVEYS:  FOTO 56  COGNITION: Overall cognitive status: Within functional limits for tasks assessed     SENSATION: N/T in feet and toes  EDEMA:  Swelling noted left knee versus right  POSTURE: rounded shoulders and forward head  PALPATION: Swelling left knee  LOWER EXTREMITY ROM:  Active ROM Right eval Left eval Left 03/25/23 Left 04/11/23  Hip flexion      Hip extension  Hip abduction      Hip adduction      Hip internal rotation      Hip external rotation      Knee flexion 115 107 118 120  Knee extension 0 -7 -5 -6  Ankle dorsiflexion      Ankle plantarflexion      Ankle inversion      Ankle eversion       (Blank rows = not tested)  LOWER EXTREMITY MMT:  MMT Right eval Left eval Left 04/11/23  Hip flexion 4+ 4- 4  Hip extension     Hip abduction     Hip adduction     Hip internal rotation     Hip external rotation     Knee flexion     Knee extension 4+ 4 4+  Ankle dorsiflexion 5 5 5   Ankle plantarflexion     Ankle inversion     Ankle eversion      (Blank rows = not tested)  FUNCTIONAL TESTS:  5 times sit to stand: 18.44 sec using hands to assist  GAIT: Distance walked: 50 ft in clinic Assistive device utilized: None Level of assistance: Modified independence Comments: takes one misstep walking in clinic   TODAY'S TREATMENT:                                                                                                                              DATE:  04/11/23 5 times sit to stand 11.99 sec using hands to assist FOTO 56 MMT's see above AROM see above  04/01/23 Nustep seat 9 x 5' dynamic warm up  Standing: Heel raises 2 x 10 12" box knee drives for flexion x 2' Hip vectors x 8 each GTB TKE's x  20  Sitting: LAQ's 2# 2 x 10  03/25/23 Nustep seat 9 x 5' dynamic warm up  Supine: Left quad set 5" hold x 10 Heel slides x 10 2# SAQ's 2 x 10 SLR x 10 Left knee extension stretch x 2' with towel roll under heel  Left knee flexion today -5 to 118  Sitting: LAQ's 2 x 10  Sit to stand x 5 using hands to assist; x 5 without hands with mat slightly elevated TKE with GTB x 15 reps   03/18/23:   Reviewed goals Educated importance of HEP compliance  Seated: -LAQ 10x 5" -STS with UE support 10x cueing for mechanics  -Educated log rolling Supine: -Quad sets 10x5" -Knee extension with ankle prop on towel -SAQ 10x5" -Bridge -SLR  Standing: SLS Lt 5", Rt 1-2" max  02/24/23 physical therapy evaluation and HEP instruction    PATIENT EDUCATION:  Education details: Patient educated on exam findings, POC, scope of PT, HEP, and what to expect next visit. Person educated: Patient Education method: Explanation, Demonstration, and Handouts Education comprehension: verbalized understanding, returned demonstration, verbal cues required, and tactile cues required  HOME EXERCISE PROGRAM: 04/01/23 hip vectors, heel raises, knee flexion  on step 03/25/23 sit to stand, SLS Access Code: [facility] URL: [email or web address] Date: 02/25/2023 Prepared by: [facility] - Rehab  Exercises - Supine Knee Extension Stretch on Towel Roll  - 2 x daily - 7 x weekly - 1 sets - 1 reps - 5 min hold - Supine Quad Set  - 2 x daily - 7 x weekly - 1 sets - 10 reps - Sit to Stand with Armchair  - 2 x daily - 7 x weekly - 1 sets - 5 reps - Seated Long Arc Quad  - 2 x daily - 7 x weekly - 1 sets - 10 reps - 2 sec hold  ASSESSMENT:  CLINICAL IMPRESSION: Progress note today. Patient has met STG's; 2/5 LTG's; she still has some pain when standing on left leg only due to increased joint compression.  Will see her orthopedic next week and hopefully consider an injection.  She is agreeable to discharge at  this time.    Eval:  Patient is a 82 y.o. female who was seen today for physical therapy evaluation and treatment for left knee pain. Patient demonstrates muscle weakness, reduced ROM, and fascial restrictions which are likely contributing to symptoms of pain and are negatively impacting patient ability to perform ADLs and functional mobility tasks. Patient will benefit from skilled physical therapy services to address these deficits to reduce pain and improve level of function with ADLs and functional mobility tasks.   OBJECTIVE IMPAIRMENTS: Abnormal gait, decreased activity tolerance, decreased balance, decreased mobility, difficulty walking, decreased ROM, decreased strength, hypomobility, increased edema, increased fascial restrictions, impaired perceived functional ability, and pain.   ACTIVITY LIMITATIONS: carrying, lifting, bending, sitting, standing, squatting, sleeping, stairs, transfers, locomotion level, and caring for others  PARTICIPATION LIMITATIONS: meal prep, cleaning, laundry, and community activity   REHAB POTENTIAL: Good  CLINICAL DECISION MAKING: Stable/uncomplicated  EVALUATION COMPLEXITY: Low   GOALS: Goals reviewed with patient? No  SHORT TERM GOALS: Target date: 03/18/2023 patient will be independent with initial HEP   Baseline: Goal status: MET  2.  Patient will self report 30% improvement to improve tolerance for functional activity  Baseline:  Goal status: met   LONG TERM GOALS: Target date: 04/08/2023  Patient will be independent in self management strategies to improve quality of life and functional outcomes.  Baseline:  Goal status: met  2.  Patient will self report 50% improvement to improve tolerance for functional activity  Baseline:  Goal status: met  3.  Patient will increased left knee mobility to 0 to 115 to promote normal navigation of steps; step over step pattern  Baseline: see above; knee flexion 118 03/25/23; 04/11/23 120 left  knee flexion; continues with extension lag Goal status: In progress  4.  Patient will increase left  leg MMTs to 5/5 without pain to promote return to ambulation community distances with minimal deviation.  Baseline: see above Goal status: IN PROGRESS  5.  Patient will improve FOTO score by 10 points Baseline: 56; 04/11/23 56 Goal status: IN PROGRESS    PLAN:  PT FREQUENCY: 1x/week  PT DURATION: 6 weeks  PLANNED INTERVENTIONS: Therapeutic exercises, Therapeutic activity, Neuromuscular re-education, Balance training, Gait training, Patient/Family education, Joint manipulation, Joint mobilization, Stair training, Orthotic/Fit training, DME instructions, Aquatic Therapy, Dry Needling, Electrical stimulation, Spinal manipulation, Spinal mobilization, Cryotherapy, Moist heat, Compression bandaging, scar mobilization, Splintting, Taping, Traction, Ultrasound, Ionotophoresis 4mg /ml Dexamethasone, and Manual therapy   PLAN FOR NEXT SESSION: discharge  2:27 PM, 04/11/23 Laqueisha Catalina Small Noami Bove MPT [place] [facility]  [place] #[id number] [phone]

## 2023-04-15 ENCOUNTER — Encounter (HOSPITAL_COMMUNITY): Payer: Medicare PPO

## 2023-04-15 ENCOUNTER — Ambulatory Visit (INDEPENDENT_AMBULATORY_CARE_PROVIDER_SITE_OTHER): Payer: Medicare PPO

## 2023-04-15 VITALS — Ht 65.0 in | Wt 160.0 lb

## 2023-04-15 DIAGNOSIS — Z0000 Encounter for general adult medical examination without abnormal findings: Secondary | ICD-10-CM

## 2023-04-15 NOTE — Progress Notes (Signed)
Subjective:   [name] [name] is a 82 y.o. female who presents for Medicare Annual (Subsequent) preventive examination.  Visit Complete: Virtual  I connected with  [name] [name] on 04/15/23 by a audio enabled telemedicine application and verified that I am speaking with the correct person using two identifiers.  Patient Location: Home  Provider Location: Office/Clinic  I discussed the limitations of evaluation and management by telemedicine. The patient expressed understanding and agreed to proceed.  Vital Signs: Because this visit was a virtual/telehealth visit, some criteria may be missing or patient reported. Any vitals not documented were not able to be obtained and vitals that have been documented are patient reported.    Review of Systems    Cardiac Risk Factors include: advanced age (>67men, >31 women);dyslipidemia;hypertension;Other (see comment), Risk factor comments: Osteopenia     Objective:    Today's Vitals   04/15/23 1302  Weight: 160 lb (72.6 kg)  Height: 5\' 5"  (1.651 m)   Body mass index is 26.63 kg/m.     04/15/2023    1:09 PM 02/25/2023    1:58 PM 03/26/2022    8:58 AM 11/29/2021    6:19 PM 11/19/2020    4:31 PM 10/10/2020   11:32 AM 01/19/2019    4:35 PM  Advanced Directives  Does Patient Have a Medical Advance Directive? Yes Yes Yes No Yes Yes Yes  Type of Estate agent of [place];Living will Healthcare Power of [place];Living will Healthcare Power of [place];Living will   Healthcare Power of [place];Living will Healthcare Power of [place];Living will  Does patient want to make changes to medical advance directive? No - Patient declined No - Patient declined   No - Patient declined    Copy of Healthcare Power of Attorney in Chart? Yes - validated most recent copy scanned in chart (See row information)  No - copy requested   No - copy requested No - copy requested    Current Medications (verified) Outpatient Encounter Medications as of  04/15/2023  Medication Sig   acetaminophen (TYLENOL) 500 MG tablet Take 1,000 mg by mouth every 6 (six) hours as needed for moderate pain or headache.   bisoprolol (ZEBETA) 10 MG tablet TAKE 1 TABLET BY MOUTH IN THE MORNING AND 1/2 AT BEDTIME.   budesonide (ENTOCORT EC) 3 MG 24 hr capsule Take 1 capsule (3 mg total) by mouth 3 (three) times daily. (Patient taking differently: Take 3 mg by mouth daily as needed (flare up).)   calcium-vitamin D (OSCAL WITH D) 500-5 MG-MCG tablet Take 1 tablet by mouth.   conjugated estrogens (PREMARIN) vaginal cream Premarin 0.625 mg/gram vaginal cream  Insert 0.5 applicatorsful twice a week by vaginal route.   dicyclomine (BENTYL) 10 MG capsule Take 1 capsule (10 mg total) by mouth 2 (two) times daily as needed for spasms.   diltiazem (CARDIZEM CD) 360 MG 24 hr capsule Take 1 capsule (360 mg total) by mouth daily.   Doxylamine Succinate, Sleep, (UNISOM PO) Take 1 tablet by mouth at bedtime.   ELIQUIS 5 MG TABS tablet TAKE ONE TABLET BY MOUTH TWICE DAILY   famotidine (PEPCID) 20 MG tablet Take 20 mg by mouth 2 (two) times daily.   furosemide (LASIX) 20 MG tablet Take 1 tablet (20 mg total) by mouth daily as needed for fluid or edema.   spironolactone (ALDACTONE) 25 MG tablet Take 1 tablet (25 mg total) by mouth daily.   No facility-administered encounter medications on file as of 04/15/2023.  Allergies (verified) Levaquin [levofloxacin in d5w], Polysporin [bacitracin-polymyxin b], Pravastatin, Sulfa antibiotics, Sulfonamide derivatives, Augmentin [amoxicillin-pot clavulanate], Doxycycline, and Erythromycin   History: Past Medical History:  Diagnosis Date   Allergic rhinitis    takes Benadryl at bedtime   Allergy    Anemia    Angio-edema    Arthritis    osteoarthritis   Atrial fibrillation (HCC) 06-10-2011   takes Diltiazem and Pradaxa daily   Cancer [facility])    skin   Cataract    bilateral cataracts removed   Clotting disorder (HCC)    on PRADAXA -  chronic a fib   Collagenous colitis    recurrent, takes Budesonide daily as needed    Colon polyps 01/14/2010   Hyperplastic   Cough    states every day of her life and every chest xray is always clear   GERD (gastroesophageal reflux disease)    takes Omeprazole daily   History of poliomyelitis    Polio  age 56- no significant neuromuscular deficit   Hyperlipidemia    was on Pravastatin but joint pain;has been off for 2-86months ;takes CoQ10   Hypertension    takes Bisoprolol and Chlorthalidone daily   Potassium deficiency    takes KDUR daily   Past Surgical History:  Procedure Laterality Date   ADENOIDECTOMY     APPENDECTOMY     at age 7   BREAST BIOPSY  1947 & 10/07   Benign   CARDIOVERSION N/A 11/05/2016   Procedure: CARDIOVERSION;  Surgeon: Quintella Reichert, MD;  Location: [facility] ENDOSCOPY;  Service: Cardiovascular;  Laterality: N/A;   cataracts removed     COLONOSCOPY     ENDOVENOUS ABLATION SAPHENOUS VEIN W/ LASER  03-03-2011 left greater saphenous vein    ENDOVENOUS ABLATION SAPHENOUS VEIN W/ LASER  01-06-2011  right greater saphenous vein   epidural infection     x 2    IR RADIOLOGIST EVAL & MGMT  03/07/2017   SKIN CANCER EXCISION     removal of skin cancer on left leg   TONSILLECTOMY     as a child   TONSILLECTOMY     TOTAL KNEE ARTHROPLASTY Right 09/18/2013   Procedure: TOTAL KNEE ARTHROPLASTY;  Surgeon: Valeria Batman, MD;  Location: [facility] OR;  Service: Orthopedics;  Laterality: Right;   Family History  Problem Relation Age of Onset   Stomach cancer Maternal Grandfather    Heart disease Paternal Grandfather    Colon cancer Son    Rectal cancer Son 3   Colon cancer Paternal Grandmother    Alcohol abuse Father    Heart disease Father    Stroke Father    Hypertension Father    Hypertension Sister    Other Sister        varicose veins   Peripheral vascular disease Mother    Other Mother        varicose veins   Esophageal cancer Neg Hx    Pancreatic cancer Neg  Hx    Social History   Socioeconomic History   Marital status: Widowed    Spouse name: Not on file   Number of children: 2   Years of education: Not on file   Highest education level: Not on file  Occupational History   Occupation: retired    Comment: helps people with food, keeping power on, etc in [place]  Tobacco Use   Smoking status: Former    Current packs/day: 0.00    Average packs/day: 1 pack/day for 18.0 years (  18.0 ttl pk-yrs)    Types: Cigarettes    Start date: 08/16/1969    Quit date: 08/17/1987    Years since quitting: 35.6   Smokeless tobacco: Never   Tobacco comments:    quit smoking 1989  Vaping Use   Vaping status: Never Used  Substance and Sexual Activity   Alcohol use: Yes    Alcohol/week: 7.0 standard drinks of alcohol    Types: 7 Glasses of wine per week    Comment: wine - 1-2 glasses a day   Drug use: No   Sexual activity: Not Currently    Partners: Male    Birth control/protection: Surgical  Other Topics Concern   Not on file  Social History Narrative   Retired Film/video editor    2 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   Married     Former Smoker quit 1989    Alcohol use-yes -  glasses of wine/day      son works for Event organ[employer] - she flies to [place] to visit her grandchildren   Social Determinants of Health   Financial Resource Strain: Low Risk  (04/15/2023)   Overall Financial Resource Strain (CARDIA)    Difficulty of Paying Living Expenses: Not hard at all  Food Insecurity: No Food Insecurity (04/15/2023)   Hunger Vital Sign    Worried About Running Out of Food in the Last Year: Never true    Ran Out of Food in the Last Year: Never true  Transportation Needs: No Transportation Needs (04/15/2023)   PRAPARE - Administrator, Civil Service (Medical): No    Lack of Transportation (Non-Medical): No  Physical Activity: Inactive (04/15/2023)   Exercise Vital Sign    Days of Exercise per Week: 0 days    Minutes of Exercise per Session: 0 min  Stress: No  Stress Concern Present (04/15/2023)   [employer] of Occupational Health - Occupational Stress Questionnaire    Feeling of Stress : Not at all  Social Connections: Moderately Integrated (04/15/2023)   Social Connection and Isolation Panel [NHANES]    Frequency of Communication with Friends and Family: More than three times a week    Frequency of Social Gatherings with Friends and Family: More than three times a week    Attends Religious Services: More than 4 times per year    Active Member of [employer] or Organizations: Yes    Attends Banker Meetings: More than 4 times per year    Marital Status: Widowed    Tobacco Counseling Counseling given: Not Answered Tobacco comments: quit smoking 1989   Clinical Intake:  Pre-visit preparation completed: Yes        BMI - recorded: 26.63 Nutritional Risks: None Diabetes: No  How often do you need to have someone help you when you read instructions, pamphlets, or other written materials from your doctor or pharmacy?: 1 - Never  Interpreter Needed?: No  Information entered by :: Kenyada Hy, RMA   Activities of Daily Living    04/15/2023    1:05 PM  In your present state of health, do you have any difficulty performing the following activities:  Hearing? 0  Vision? 0  Difficulty concentrating or making decisions? 0  Walking or climbing stairs? 0  Dressing or bathing? 0  Doing errands, shopping? 0  Preparing Food and eating ? N  Using the Toilet? N  In the past six months, have you accidently leaked urine? N  Do you have problems with loss of bowel control? N  Managing your Medications? N  Managing your Finances? N  Housekeeping or managing your Housekeeping? N    Patient Care Team: Pincus Sanes, MD as PCP - General (Internal Medicine) Jens Som Madolyn Frieze, MD as PCP - Cardiology (Cardiology) Hart Carwin, MD (Inactive) (Gastroenterology) Valeria Batman, MD (Inactive) (Orthopedic Surgery) Lewayne Bunting, MD (Cardiology) [facility], [facility]  Indicate any recent Medical Services you may have received from other than [facility] providers in the past year (date may be approximate).     Assessment:   This is a routine wellness examination for [name] [name].  Hearing/Vision screen Hearing Screening - Comments:: Denies hearing difficulties   Vision Screening - Comments:: Denies vision issues  Dietary issues and exercise activities discussed:     Goals Addressed             This Visit's Progress    Patient Stated   On track    I want to continue to take as much time for myself as possible, enjoy life and family.      Depression Screen    04/15/2023    1:15 PM 01/31/2023    1:08 PM 03/26/2022    8:58 AM 03/26/2022    8:57 AM 01/28/2022    8:24 AM 12/11/2021    2:28 PM 11/19/2020   12:46 PM  PHQ 2/9 Scores  PHQ - 2 Score 0 0 0 0 0 0 0  PHQ- 9 Score 0    1  1    Fall Risk    04/15/2023    1:10 PM 01/31/2023    1:08 PM 03/26/2022    8:58 AM 01/28/2022    8:24 AM 12/11/2021    2:28 PM  Fall Risk   Falls in the past year? 0 0 0 0 1  Number falls in past yr: 0 0 0 0 0  Injury with Fall? 0 0 0 0 0  Risk for fall due to : No Fall Risks No Fall Risks  No Fall Risks   Follow up Falls prevention discussed;Falls evaluation completed Falls evaluation completed Falls evaluation completed;Education provided Falls evaluation completed     MEDICARE RISK AT HOME: Medicare Risk at Home Any stairs in or around the home?: Yes If so, are there any without handrails?: Yes Home free of loose throw rugs in walkways, pet beds, electrical cords, etc?: Yes Adequate lighting in your home to reduce risk of falls?: Yes Life alert?: No Use of a cane, walker or w/c?: Yes Grab bars in the bathroom?: No Shower chair or bench in shower?: Yes Elevated toilet seat or a handicapped toilet?: Yes  TIMED UP AND GO:  Was the test performed?  No    Cognitive Function:    01/04/2018   10:52 AM  MMSE -  Mini Mental State Exam  Orientation to time 5  Orientation to Place 5  Registration 3  Attention/ Calculation 5  Recall 3  Language- name 2 objects 2  Language- repeat 1  Language- follow 3 step command 3  Language- read & follow direction 1  Write a sentence 1  Copy design 1  Total score 30        04/15/2023    1:11 PM 03/26/2022    9:03 AM  6CIT Screen  What Year? 0 points 0 points  What month? 0 points 0 points  What time? 0 points 0 points  Count back from 20 0 points 0 points  Months in reverse 0 points 0  points  Repeat phrase 0 points 0 points  Total Score 0 points 0 points    Immunizations Immunization History  Administered Date(s) Administered   Influenza Split 06/29/2011, 06/09/2012   Influenza Whole 06/05/2007, 06/04/2008, 04/27/2009   Influenza, High Dose Seasonal PF 06/05/2018, 05/09/2020   Influenza,inj,quad, With Preservative 06/11/2019   Influenza-Unspecified 05/16/2013, 06/17/2015, 06/15/2017, 05/23/2019   Moderna Sars-Covid-2 Vaccination 08/23/2019, 09/21/2019, 06/12/2020   Pneumococcal Conjugate-13 07/24/2013   Pneumococcal Polysaccharide-23 04/23/2008   Td 10/20/1998, 09/19/2009   Tdap 10/12/2021   Zoster, Live 01/25/2006    TDAP status: Completed at today's visit  Flu Vaccine status: Due, Education has been provided regarding the importance of this vaccine. Advised may receive this vaccine at local pharmacy or Health Dept. Aware to provide a copy of the vaccination record if obtained from local pharmacy or Health Dept. Verbalized acceptance and understanding.  Pneumococcal vaccine status: Up to date  Covid-19 vaccine status: Information provided on how to obtain vaccines.   Qualifies for Shingles Vaccine? Yes   Zostavax completed Yes   Shingrix Completed?: Yes  Screening Tests Health Maintenance  Topic Date Due   DEXA SCAN  Never done   COVID-19 Vaccine (4 - 2023-24 season) 04/16/2022   INFLUENZA VACCINE  03/17/2023   Medicare Annual  Wellness (AWV)  04/14/2024   DTaP/Tdap/Td (4 - Td or Tdap) 10/13/2031   Pneumonia Vaccine 90+ Years old  Completed   HPV VACCINES  Aged Out   Colonoscopy  Discontinued   Zoster Vaccines- Shingrix  Discontinued    Health Maintenance  Health Maintenance Due  Topic Date Due   DEXA SCAN  Never done   COVID-19 Vaccine (4 - 2023-24 season) 04/16/2022   INFLUENZA VACCINE  03/17/2023    Colorectal cancer screening: No longer required.   Mammogram status: Completed 04/06/2023. Repeat every year  Bone Density status: Completed 2024. Results reflect: Bone density results: OSTEOPENIA. Repeat every 2 years.  Lung Cancer Screening: (Low Dose CT Chest recommended if Age 102-80 years, 20 pack-year currently smoking OR have quit w/in 15years.) does not qualify.   Lung Cancer Screening Referral: N/A  Additional Screening:  Hepatitis C Screening: does not qualify;  Vision Screening: Recommended annual ophthalmology exams for early detection of glaucoma and other disorders of the eye. Is the patient up to date with their annual eye exam?  No  Who is the provider or what is the name of the office in which the patient attends annual eye exams? Dr. Alexia Freestone If pt is not established with a provider, would they like to be referred to a provider to establish care? No .   Dental Screening: Recommended annual dental exams for proper oral hygiene   Community Resource Referral / Chronic Care Management: CRR required this visit?  No   CCM required this visit?  No     Plan:     I have personally reviewed and noted the following in the patient's chart:   Medical and social history Use of alcohol, tobacco or illicit drugs  Current medications and supplements including opioid prescriptions. Patient is not currently taking opioid prescriptions. Functional ability and status Nutritional status Physical activity Advanced directives List of other physicians Hospitalizations, surgeries, and ER visits  in previous 12 months Vitals Screenings to include cognitive, depression, and falls Referrals and appointments  In addition, I have reviewed and discussed with patient certain preventive protocols, quality metrics, and best practice recommendations. A written personalized care plan for preventive services as well as general preventive health recommendations  were provided to patient.     Dior Stepter L Damian Buckles, CMA   04/15/2023   After Visit Summary: (MyChart) Due to this being a telephonic visit, the after visit summary with patients personalized plan was offered to patient via MyChart   Nurse Notes: According to patient's chart, she is due for a DEXA, however patient stated that she has had a DEXA already this year.  She is up to date on health maintenance.   Patient had no other concerns to address today.

## 2023-04-15 NOTE — Patient Instructions (Signed)
[name] [name] , Thank you for taking time to come for your Medicare Wellness Visit. I appreciate your ongoing commitment to your health goals. Please review the following plan we discussed and let me know if I can assist you in the future.   Referrals/Orders/Follow-Ups/Clinician Recommendations: Each day, aim for 6 glasses of water, plenty of protein in your diet and try to get up and walk/ stretch every hour for 5-10 minutes at a time.  Keep up the good work.  This is a list of the screening recommended for you and due dates:  Health Maintenance  Topic Date Due   DEXA scan (bone density measurement)  Never done   DTaP/Tdap/Td vaccine (3 - Tdap) 09/20/2019   COVID-19 Vaccine (4 - 2023-24 season) 04/16/2022   Flu Shot  03/17/2023   Medicare Annual Wellness Visit  04/14/2024   Pneumonia Vaccine  Completed   HPV Vaccine  Aged Out   Colon Cancer Screening  Discontinued   Zoster (Shingles) Vaccine  Discontinued    Advanced directives: (In Chart) A copy of your advanced directives are scanned into your chart should your provider ever need it.  Next Medicare Annual Wellness Visit scheduled for next year: Yes

## 2023-05-04 ENCOUNTER — Encounter: Payer: Self-pay | Admitting: Physician Assistant

## 2023-05-04 ENCOUNTER — Ambulatory Visit: Payer: Medicare PPO | Admitting: Physician Assistant

## 2023-05-04 DIAGNOSIS — M1712 Unilateral primary osteoarthritis, left knee: Secondary | ICD-10-CM

## 2023-05-04 NOTE — Progress Notes (Signed)
Office Visit Note   Patient: [name] [name]           Date of Birth: 07-16-1941           MRN: [id number] Visit Date: 05/04/2023              Requested by: Pincus Sanes, MD [address] [place],  [place] [address] PCP: Pincus Sanes, MD      HPI: Ilanna is a pleasant 82 year old woman who is a longtime patient of Dr. Cleophas Dunker.  She is 9 years status post right total knee arthroplasty and did quite well.  Over the years she has had increasing problems with her left knee.  She came to see me in May and we decided to try some physical therapy.  She really has not gotten any help with the PT.  She comes in today with continued pain in her left knee causing her to use a walking stick.  Denies any fever or chills.  Physical therapy was not helpful.  She says she just wants to get "something done.  Assessment & Plan: Visit Diagnoses: Osteoarthritis left knee  Plan: Osteoarthritis left knee.  Had a long discussion with her.  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  She would like to continue to stay very active.  She has degenerative disc disease in her back.  She also has a history of A-fib and is on Eliquis.  She was already on this for her previous knee replacement and did fine.  I will refer her to Dr. Magnus Ivan for discussion of further treatment of her left knee including total knee arthroplasty.  She is concerned as she is widowed since her last surgery and she would want to go to a rehab after surgery.  As she does not have someone readily available to stay with her.  Offered to aspirate her knee today but she declined.  There is no signs of infection  Follow-Up Instructions: No follow-ups on file.   Ortho Exam  Patient is alert, oriented, no adenopathy, well-dressed, normal affect, normal respiratory effort. Examination of her left knee she is neurovascularly intact she does have some bilateral lower extremity edema.  Calves are soft and nontender  negative Homans' sign pulses are intact.  She does have to mild to moderate effusion but her knee is cold without any erythema.  Imaging: No results found. No images are attached to the encounter.  Labs: Lab Results  Component Value Date   HGBA1C 5.4 01/31/2023   HGBA1C 5.2 12/29/2021   HGBA1C 5.3 11/19/2020   CRP 0.3 03/31/2009   REPTSTATUS 06/17/2017 FINAL 06/15/2017   CULT  06/15/2017    NO GROWTH Performed at [facility], [address] [place], [place] [address]    LABORGA NO GROWTH 07/24/2013     Lab Results  Component Value Date   ALBUMIN 4.5 01/31/2023   ALBUMIN 4.3 12/29/2021   ALBUMIN 4.6 02/13/2021    Lab Results  Component Value Date   MG 1.8 03/13/2013   MG 2.0 06/29/2011   MG 1.8 06/25/2011   Lab Results  Component Value Date   VD25OH 29.26 (L) 01/31/2023   VD25OH 27.13 (L) 01/28/2022    No results found for: "PREALBUMIN"    Latest Ref Rng & Units 01/31/2023    1:49 PM 01/28/2022    8:58 AM 12/29/2021    8:54 AM  CBC EXTENDED  WBC 4.0 - 10.5  K/uL 7.4  7.2  7.7   RBC 3.87 - 5.11 Mil/uL 3.83  3.55  3.26   Hemoglobin 12.0 - 15.0 g/dL 91.4  78.2  95.6   HCT 36.0 - 46.0 % 39.9  35.7  33.3   Platelets 150.0 - 400.0 K/uL 239.0  292.0  223.0   NEUT# 1.4 - 7.7 K/uL 4.6  4.6  5.5   Lymph# 0.7 - 4.0 K/uL 1.5  1.3  1.3      There is no height or weight on file to calculate BMI.  Orders:  No orders of the defined types were placed in this encounter.  No orders of the defined types were placed in this encounter.    Procedures: No procedures performed  Clinical Data: No additional findings.  ROS:  All other systems negative, except as noted in the HPI. Review of Systems  Objective: Vital Signs: There were no vitals taken for this visit.  Specialty Comments:  No specialty comments available.  PMFS History: Patient Active Problem List   Diagnosis Date Noted   Anemia 01/28/2022   Osteopenia 01/28/2022   Allergy to alpha-gal  01/28/2022   Sinus complaint 01/28/2022   B12 deficiency 01/28/2022   Vitamin D deficiency 01/28/2022   Aortic atherosclerosis (HCC) 11/19/2020   Bladder prolapse, female, acquired 11/12/2019   Chronic sinusitis 12/06/2018   Hyperglycemia 01/03/2017   DDD (degenerative disc disease), lumbar 01/03/2017   Chronic back pain 01/02/2017   Bilateral leg edema 04/13/2016   Fatigue 05/29/2015   Osteoarthritis of right knee 09/21/2013   S/P total knee replacement using cement 09/18/2013   Anisocoria 07/26/2013   Unilateral primary osteoarthritis, left knee 06/09/2012   Varicose veins of bilateral lower extremities with other complications 09/02/2011   Paroxysmal Atrial Fibrillation 06/29/2011   COUGH, CHRONIC 03/09/2010   HIP PAIN, BILATERAL 07/17/2009   ALLERGIC RHINITIS 04/23/2008   GERD 11/01/2007   Essential hypertension 09/20/2007   SKIN CANCER, LEG 07/03/2007   Hyperlipidemia 07/03/2007   Colitis, collagenous 07/03/2007   Past Medical History:  Diagnosis Date   Allergic rhinitis    takes Benadryl at bedtime   Allergy    Anemia    Angio-edema    Arthritis    osteoarthritis   Atrial fibrillation (HCC) 06-10-2011   takes Diltiazem and Pradaxa daily   Cancer (HCC)    skin   Cataract    bilateral cataracts removed   Clotting disorder (HCC)    on PRADAXA - chronic a fib   Collagenous colitis    recurrent, takes Budesonide daily as needed    Colon polyps 01/14/2010   Hyperplastic   Cough    states every day of her life and every chest xray is always clear   GERD (gastroesophageal reflux disease)    takes Omeprazole daily   History of poliomyelitis    Polio  age 61- no significant neuromuscular deficit   Hyperlipidemia    was on Pravastatin but joint pain;has been off for 2-64months ;takes CoQ10   Hypertension    takes Bisoprolol and Chlorthalidone daily   Potassium deficiency    takes [facility]UR daily    Family History  Problem Relation Age of Onset   Stomach cancer  Maternal Grandfather    Heart disease Paternal Grandfather    Colon cancer Son    Rectal cancer Son 69   Colon cancer Paternal Grandmother    Alcohol abuse Father    Heart disease Father    Stroke Father    Hypertension Father  Hypertension Sister    Other Sister        varicose veins   Peripheral vascular disease Mother    Other Mother        varicose veins   Esophageal cancer Neg Hx    Pancreatic cancer Neg Hx     Past Surgical History:  Procedure Laterality Date   ADENOIDECTOMY     APPENDECTOMY     at age 62   BREAST BIOPSY  1947 & 10/07   Benign   CARDIOVERSION N/A 11/05/2016   Procedure: CARDIOVERSION;  Surgeon: Quintella Reichert, MD;  Location: [facility];  Service: Cardiovascular;  Laterality: N/A;   cataracts removed     COLONOSCOPY     ENDOVENOUS ABLATION SAPHENOUS VEIN W/ LASER  03-03-2011 left greater saphenous vein    ENDOVENOUS ABLATION SAPHENOUS VEIN W/ LASER  01-06-2011  right greater saphenous vein   epidural infection     x 2    IR RADIOLOGIST EVAL & MGMT  03/07/2017   SKIN CANCER EXCISION     removal of skin cancer on left leg   TONSILLECTOMY     as a child   TONSILLECTOMY     TOTAL KNEE ARTHROPLASTY Right 09/18/2013   Procedure: TOTAL KNEE ARTHROPLASTY;  Surgeon: Valeria Batman, MD;  Location: [facility] OR;  Service: Orthopedics;  Laterality: Right;   Social History   Occupational History   Occupation: retired    Comment: helps people with food, keeping power on, etc in [place]  Tobacco Use   Smoking status: Former    Current packs/day: 0.00    Average packs/day: 1 pack/day for 18.0 years (18.0 ttl pk-yrs)    Types: Cigarettes    Start date: 08/16/1969    Quit date: 08/17/1987    Years since quitting: 35.7   Smokeless tobacco: Never   Tobacco comments:    quit smoking 1989  Vaping Use   Vaping status: Never Used  Substance and Sexual Activity   Alcohol use: Yes    Alcohol/week: 7.0 standard drinks of alcohol    Types: 7 Glasses of wine per  week    Comment: wine - 1-2 glasses a day   Drug use: No   Sexual activity: Not Currently    Partners: Male    Birth control/protection: Surgical

## 2023-05-19 NOTE — Progress Notes (Signed)
HPI: FU AFib. Previously had cardioversion but did not hold sinus rhythm and now atrial fibrillation is managed with rate control and anticoagulation. Echocardiogram January 2022 showed normal LV function, mild asymmetric left ventricular hypertrophy, moderate left atrial enlargement, mild mitral regurgitation, mild to moderate tricuspid regurgitation, mild aortic insufficiency. Nuclear study February 2022 showed apical thinning but no ischemia.  Monitor February 2022 showed atrial fibrillation with PVCs or aberrantly conducted beats rate upper normal to mildly increased.  Since I last saw her, she noted some increased dyspnea on exertion.  No orthopnea or PND.  Chronic mild pedal edema.  No chest pain, palpitations or syncope.  No recent bleeding.  Current Outpatient Medications  Medication Sig Dispense Refill   acetaminophen (TYLENOL) 500 MG tablet Take 1,000 mg by mouth every 6 (six) hours as needed for moderate pain or headache.     bisoprolol (ZEBETA) 10 MG tablet TAKE 1 TABLET BY MOUTH IN THE MORNING AND 1/2 AT BEDTIME. 135 tablet 3   budesonide (ENTOCORT EC) 3 MG 24 hr capsule Take 1 capsule (3 mg total) by mouth 3 (three) times daily. (Patient taking differently: Take 3 mg by mouth daily as needed (flare up).) 90 capsule 1   calcium-vitamin D (OSCAL WITH D) 500-5 MG-MCG tablet Take 1 tablet by mouth.     conjugated estrogens (PREMARIN) vaginal cream Premarin 0.625 mg/gram vaginal cream  Insert 0.5 applicatorsful twice a week by vaginal route.     dicyclomine (BENTYL) 10 MG capsule Take 1 capsule (10 mg total) by mouth 2 (two) times daily as needed for spasms. 90 capsule 1   diltiazem (CARDIZEM CD) 360 MG 24 hr capsule Take 1 capsule (360 mg total) by mouth daily. 90 capsule 3   Doxylamine Succinate, Sleep, (UNISOM PO) Take 1 tablet by mouth at bedtime.     ELIQUIS 5 MG TABS tablet TAKE ONE TABLET BY MOUTH TWICE DAILY 180 tablet 1   famotidine (PEPCID) 20 MG tablet Take 20 mg by mouth 2  (two) times daily.     furosemide (LASIX) 20 MG tablet Take 1 tablet (20 mg total) by mouth daily as needed for fluid or edema. 90 tablet 1   spironolactone (ALDACTONE) 25 MG tablet Take 1 tablet (25 mg total) by mouth daily. 90 tablet 3   No current facility-administered medications for this visit.     Past Medical History:  Diagnosis Date   Allergic rhinitis    takes Benadryl at bedtime   Allergy    Anemia    Angio-edema    Arthritis    osteoarthritis   Atrial fibrillation (HCC) 06-10-2011   takes Diltiazem and Pradaxa daily   Cancer [facility])    skin   Cataract    bilateral cataracts removed   Clotting disorder (HCC)    on PRADAXA - chronic a fib   Collagenous colitis    recurrent, takes Budesonide daily as needed    Colon polyps 01/14/2010   Hyperplastic   Cough    states every day of her life and every chest xray is always clear   GERD (gastroesophageal reflux disease)    takes Omeprazole daily   History of poliomyelitis    Polio  age 75- no significant neuromuscular deficit   Hyperlipidemia    was on Pravastatin but joint pain;has been off for 2-78months ;takes CoQ10   Hypertension    takes Bisoprolol and Chlorthalidone daily   Potassium deficiency    takes [facility] daily    Past  Surgical History:  Procedure Laterality Date   ADENOIDECTOMY     APPENDECTOMY     at age 32   BREAST BIOPSY  1947 & 10/07   Benign   CARDIOVERSION N/A 11/05/2016   Procedure: CARDIOVERSION;  Surgeon: Quintella Reichert, MD;  Location: [facility];  Service: Cardiovascular;  Laterality: N/A;   cataracts removed     COLONOSCOPY     ENDOVENOUS ABLATION SAPHENOUS VEIN W/ LASER  03-03-2011 left greater saphenous vein    ENDOVENOUS ABLATION SAPHENOUS VEIN W/ LASER  01-06-2011  right greater saphenous vein   epidural infection     x 2    IR RADIOLOGIST EVAL & MGMT  03/07/2017   SKIN CANCER EXCISION     removal of skin cancer on left leg   TONSILLECTOMY     as a child   TONSILLECTOMY     TOTAL  KNEE ARTHROPLASTY Right 09/18/2013   Procedure: TOTAL KNEE ARTHROPLASTY;  Surgeon: Valeria Batman, MD;  Location: [facility] OR;  Service: Orthopedics;  Laterality: Right;    Social History   Socioeconomic History   Marital status: Widowed    Spouse name: Not on file   Number of children: 2   Years of education: Not on file   Highest education level: Not on file  Occupational History   Occupation: retired    Comment: helps people with food, keeping power on, etc in [place]  Tobacco Use   Smoking status: Former    Current packs/day: 0.00    Average packs/day: 1 pack/day for 18.0 years (18.0 ttl pk-yrs)    Types: Cigarettes    Start date: 08/16/1969    Quit date: 08/17/1987    Years since quitting: 35.8   Smokeless tobacco: Never   Tobacco comments:    quit smoking 1989  Vaping Use   Vaping status: Never Used  Substance and Sexual Activity   Alcohol use: Yes    Alcohol/week: 7.0 standard drinks of alcohol    Types: 7 Glasses of wine per week    Comment: wine - 1-2 glasses a day   Drug use: No   Sexual activity: Not Currently    Partners: Male    Birth control/protection: Surgical  Other Topics Concern   Not on file  Social History Narrative   Retired Film/video editor    2 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   Married     Former Smoker quit 1989    Alcohol use-yes -  glasses of wine/day      son works for Event organ[employer] - she flies to [place] to visit her grandchildren   Social Determinants of Health   Financial Resource Strain: Low Risk  (04/15/2023)   Overall Financial Resource Strain (CARDIA)    Difficulty of Paying Living Expenses: Not hard at all  Food Insecurity: No Food Insecurity (04/15/2023)   Hunger Vital Sign    Worried About Running Out of Food in the Last Year: Never true    Ran Out of Food in the Last Year: Never true  Transportation Needs: No Transportation Needs (04/15/2023)   PRAPARE - Administrator, Civil Service (Medical): No    Lack of Transportation (Non-Medical): No   Physical Activity: Inactive (04/15/2023)   Exercise Vital Sign    Days of Exercise per Week: 0 days    Minutes of Exercise per Session: 0 min  Stress: No Stress Concern Present (04/15/2023)   [employer] of Occupational Health - Occupational Stress Questionnaire    Feeling  of Stress : Not at all  Social Connections: Moderately Integrated (04/15/2023)   Social Connection and Isolation Panel [NHANES]    Frequency of Communication with Friends and Family: More than three times a week    Frequency of Social Gatherings with Friends and Family: More than three times a week    Attends Religious Services: More than 4 times per year    Active Member of [employer] or Organizations: Yes    Attends Banker Meetings: More than 4 times per year    Marital Status: Widowed  Intimate Partner Violence: Patient Unable To Answer (04/15/2023)   Humiliation, Afraid, Rape, and Kick questionnaire    Fear of Current or Ex-Partner: Patient unable to answer    Emotionally Abused: Patient unable to answer    Physically Abused: Patient unable to answer    Sexually Abused: Patient unable to answer    Family History  Problem Relation Age of Onset   Stomach cancer Maternal Grandfather    Heart disease Paternal Grandfather    Colon cancer Son    Rectal cancer Son 3   Colon cancer Paternal Grandmother    Alcohol abuse Father    Heart disease Father    Stroke Father    Hypertension Father    Hypertension Sister    Other Sister        varicose veins   Peripheral vascular disease Mother    Other Mother        varicose veins   Esophageal cancer Neg Hx    Pancreatic cancer Neg Hx     ROS: Some knee pain but no fevers or chills, productive cough, hemoptysis, dysphasia, odynophagia, melena, hematochezia, dysuria, hematuria, rash, seizure activity, orthopnea, PND, claudication. Remaining systems are negative.  Physical Exam: Well-developed well-nourished in no acute distress.  Skin is warm and  dry.  HEENT is normal.  Neck is supple.  Chest is clear to auscultation with normal expansion.  Cardiovascular exam is irregular Abdominal exam nontender or distended. No masses palpated. Extremities show no edema. neuro grossly intact  EKG Interpretation Date/Time:  Thursday June 02 2023 11:18:40 EDT Ventricular Rate:  81 PR Interval:    QRS Duration:  76 QT Interval:  376 QTC Calculation: 436 R Axis:   -25  Text Interpretation: Atrial fibrillation with premature ventricular or aberrantly conducted complexes When compared with ECG of 15-Jun-2017 13:35, PREVIOUS ECG IS PRESENT Confirmed by [name] ([id number]) on 06/02/2023 11:25:28 AM    A/P  1 permanent atrial fibrillation-will continue Cardizem and bisoprolol for rate control.  Continue apixaban.  She is describing some increased dyspnea on exertion.  Will repeat echocardiogram to reassess LV function and to reassess aortic and mitral regurgitation.  2 hyperlipidemia-intolerant to statins.  Continue diet.  3 hypertension-blood pressure controlled.  Continue present medical regimen.  4 lower extremity edema-continue diuretic at present dose.  Edema is reasonably well controlled.  [name], MD

## 2023-05-23 ENCOUNTER — Ambulatory Visit: Payer: Medicare PPO | Admitting: Orthopaedic Surgery

## 2023-05-23 ENCOUNTER — Encounter: Payer: Self-pay | Admitting: Orthopaedic Surgery

## 2023-05-23 VITALS — Ht 62.0 in | Wt 161.4 lb

## 2023-05-23 DIAGNOSIS — M1712 Unilateral primary osteoarthritis, left knee: Secondary | ICD-10-CM

## 2023-05-23 NOTE — Progress Notes (Signed)
The patient is an 82 year old female that I am seeing for the first time but she was a long-term patient of Dr. Cleophas Dunker.  He actually replaced her right knee successfully in 2015.  She has known well-documented severe arthritis of her left knee and is considering knee replacement surgery.  Her knee does not hurt on a daily basis but she does have some times where it hurts her quite a bit.  She has tried and failed all forms of conservative treatment.  She cannot take anti-inflammatories due to being on Eliquis.  She has had injections of steroid in the past.  I was able to review all of her medicines and past medical history within epic.  She does see her cardiologist next month.  She denies any active medical issues and tries to stay active and healthy.  Examination of her left knee shows varus malalignment.  The knee is swollen.  There is medial and lateral joint line tenderness and patellofemoral crepitation throughout the arc of motion of her knee.  It is a painful arc of motion.  X-rays of the left knee from her last visit show complete bone-on-bone wear of all 3 compartments with osteophytes in all 3 compartments.  Given the severity of arthritis we are recommending knee replacement.  She would like to consider this sometime after the first of the year so she has our surgery scheduler's card and will give [place] a call.  She would need to be off of Eliquis for 3 days.  Since she does live alone, we would look at options for short-term skilled nursing after surgery as well.  I believe she stayed at the [facility] when she recovered from her first total knee.  All question and concerns were addressed and answered.  She will let us know.

## 2023-05-27 ENCOUNTER — Ambulatory Visit (INDEPENDENT_AMBULATORY_CARE_PROVIDER_SITE_OTHER): Payer: Medicare PPO | Admitting: Podiatry

## 2023-05-27 DIAGNOSIS — B351 Tinea unguium: Secondary | ICD-10-CM

## 2023-05-27 NOTE — Patient Instructions (Signed)

## 2023-05-27 NOTE — Progress Notes (Signed)
Patient presents today for laser maintenance treatment. Diagnosed with mycotic nail infection by Dr. Allena Katz.   All other systems are negative.  Nails were trimmed and filed thin. Laser therapy was administered to 1-5 toenails bilateral and patient tolerated the treatment well. All safety precautions were in place. Most effected toenails left 1st, right 3rd, right 5th.  Since patient has had multiple rounds of laser, I recommended laser maintenance every 3 months for preventative. She will follow up in 3 months.

## 2023-05-31 ENCOUNTER — Other Ambulatory Visit: Payer: Self-pay | Admitting: Cardiology

## 2023-05-31 DIAGNOSIS — I4821 Permanent atrial fibrillation: Secondary | ICD-10-CM

## 2023-05-31 NOTE — Telephone Encounter (Signed)
Prescription refill request for Eliquis received. Indication:afib Last office visit:upcoming Scr:0.97  6/24 Age: 82 Weight:73.2  kg  Prescription refilled

## 2023-06-02 ENCOUNTER — Encounter: Payer: Self-pay | Admitting: Cardiology

## 2023-06-02 ENCOUNTER — Ambulatory Visit: Payer: Medicare PPO | Attending: Cardiology | Admitting: Cardiology

## 2023-06-02 VITALS — BP 130/80 | HR 81 | Ht 62.0 in | Wt 162.0 lb

## 2023-06-02 DIAGNOSIS — E7800 Pure hypercholesterolemia, unspecified: Secondary | ICD-10-CM

## 2023-06-02 DIAGNOSIS — I10 Essential (primary) hypertension: Secondary | ICD-10-CM

## 2023-06-02 DIAGNOSIS — I4821 Permanent atrial fibrillation: Secondary | ICD-10-CM

## 2023-06-02 NOTE — Patient Instructions (Addendum)
Medication Instructions:  No change *If you need a refill on your cardiac medications before your next appointment, please call your pharmacy*   Lab Work: None If you have labs (blood work) drawn today and your tests are completely normal, you will receive your results only by: MyChart Message (if you have MyChart) OR A paper copy in the mail If you have any lab test that is abnormal or we need to change your treatment, we will call you to review the results.   Testing/Procedures: ECHO  Your physician has requested that you have an echocardiogram. Echocardiography is a painless test that uses sound waves to create images of your heart. It provides your doctor with information about the size and shape of your heart and how well your heart's chambers and valves are working. This procedure takes approximately one hour. There are no restrictions for this procedure. Please do NOT wear cologne, perfume, aftershave, or lotions (deodorant is allowed). Please arrive 15 minutes prior to your appointment time.    Follow-Up: At [facility], you and your health needs are our priority.  As part of our continuing mission to provide you with exceptional heart care, we have created designated Provider Care Teams.  These Care Teams include your primary Cardiologist (physician) and Advanced Practice Providers (APPs -  Physician Assistants and Nurse Practitioners) who all work together to provide you with the care you need, when you need it.  We recommend signing up for the patient portal called "MyChart".  Sign up information is provided on this After Visit Summary.  MyChart is used to connect with patients for Virtual Visits (Telemedicine).  Patients are able to view lab/test results, encounter notes, upcoming appointments, etc.  Non-urgent messages can be sent to your provider as well.   To learn more about what you can do with MyChart, go to [email or web address].    Your next appointment:   6  month(s)  Provider:   [name], MD     Other Instructions None

## 2023-06-06 DIAGNOSIS — Z01419 Encounter for gynecological examination (general) (routine) without abnormal findings: Secondary | ICD-10-CM | POA: Diagnosis not present

## 2023-06-06 DIAGNOSIS — Z6829 Body mass index (BMI) 29.0-29.9, adult: Secondary | ICD-10-CM | POA: Diagnosis not present

## 2023-06-30 ENCOUNTER — Other Ambulatory Visit: Payer: Self-pay | Admitting: Cardiology

## 2023-07-21 ENCOUNTER — Ambulatory Visit (HOSPITAL_COMMUNITY): Payer: Medicare PPO | Attending: Internal Medicine

## 2023-07-21 DIAGNOSIS — I4821 Permanent atrial fibrillation: Secondary | ICD-10-CM | POA: Diagnosis not present

## 2023-07-21 LAB — ECHOCARDIOGRAM COMPLETE
Area-P 1/2: 4.64 cm2
P 1/2 time: 584 ms
S' Lateral: 2.7 cm

## 2023-07-25 ENCOUNTER — Ambulatory Visit: Payer: Medicare PPO | Admitting: Physician Assistant

## 2023-07-25 ENCOUNTER — Other Ambulatory Visit: Payer: Self-pay | Admitting: Cardiology

## 2023-07-25 ENCOUNTER — Encounter: Payer: Self-pay | Admitting: Physician Assistant

## 2023-07-25 VITALS — Ht 62.0 in | Wt 160.0 lb

## 2023-07-25 DIAGNOSIS — M1712 Unilateral primary osteoarthritis, left knee: Secondary | ICD-10-CM | POA: Diagnosis not present

## 2023-07-25 NOTE — Progress Notes (Signed)
HPI: Patient is 82 year old female comes in today to discuss left total knee surgery.  She underwent a right total knee arthroplasty in 2015 by Dr. Cleophas Dunker.  Right knee is doing well.  She has known end-stage arthritis involving all 3 compartments left knee.  She recently she saw her cardiologist Dr. Jens Som and had an echocardiogram.  She also is on Eliquis due to paroxysmal atrial fibrillation.  She denies any chest pain shortness of breath fevers chills or ongoing infections.  She does have a list of questions.  She would like to go to a skilled facility as she went to [facility] after the right total knee and did well.  She also would like to have a CPM postop but she felt this was beneficial.  Review of systems: See HPI otherwise negative or noncontributory.   Physical exam: General Well-developed well-nourished female no acute distress mood and affect appropriate.  Ambulates without any assistive device. Respirations: Unlabored Left knee: Patellofemoral crepitus with good range of motion.  She has tenderness along medial lateral joint line.  Varus malalignment.  Calf supple nontender dorsiflexion plantarflexion ankle intact.  Impression: Left knee tricompartmental arthritis  Plan: Will schedule her for hopefully late January to undergo a left total knee arthroplasty.  Risk benefits of surgery discussed.  Risk include but are not limited to DVT/PE, wound healing problems, blood loss, infection and nerve vessel injury.  She will need clearance from Dr. Jens Som.  She will need to stop her Eliquis 3 days prior to surgery.  Work on scheduling for surgery at have Domenica Reamer our scheduler call her to make arrangements.  Also asked Ilda Mori to talk to her about skilled nursing facility placement.

## 2023-07-26 ENCOUNTER — Other Ambulatory Visit: Payer: Self-pay | Admitting: *Deleted

## 2023-07-26 DIAGNOSIS — I059 Rheumatic mitral valve disease, unspecified: Secondary | ICD-10-CM

## 2023-07-27 ENCOUNTER — Other Ambulatory Visit: Payer: Self-pay

## 2023-07-27 MED ORDER — BISOPROLOL FUMARATE 10 MG PO TABS: ORAL_TABLET | ORAL | 2 refills | Status: DC

## 2023-07-30 ENCOUNTER — Other Ambulatory Visit: Payer: Self-pay | Admitting: Cardiology

## 2023-07-30 DIAGNOSIS — I480 Paroxysmal atrial fibrillation: Secondary | ICD-10-CM

## 2023-08-08 ENCOUNTER — Telehealth: Payer: Self-pay

## 2023-08-08 NOTE — Telephone Encounter (Signed)
   Pre-operative Risk Assessment    Patient Name: [name] [name]  DOB: Oct 04, 1940 MRN: [id number]  Last OV:  06/02/23 with Dr. Jens Som Next OV: None     Request for Surgical Clearance    Procedure:   Left Total knee Arthroplasty  Date of Surgery:  Clearance TBD                                 Surgeon:  Dr. Doneen Poisson Surgeon's Group or Practice Name:  [facility] Phone number:  [phone] Fax number:  [phone] Attn: Charlton Amor   Type of Clearance Requested:   - Medical  - Pharmacy:  Hold Apixaban (Eliquis) 3 days prior to procedure   Type of Anesthesia:  Spinal and Block     Signed, Ross Marcus   08/08/2023, 12:47 PM

## 2023-08-08 NOTE — Telephone Encounter (Signed)
   Name: [name] [name]  DOB: 16-Oct-1940  MRN: [id number]  Primary Cardiologist: [name], MD   Preoperative team, please contact this patient and set up a phone call appointment for further preoperative risk assessment. Please obtain consent and complete medication review. Thank you for your help.  I confirm that guidance regarding antiplatelet and oral anticoagulation therapy has been completed and, if necessary, noted below.  Per office protocol, patient can hold Eliquis for 3 days prior to procedure.   I also confirmed the patient resides in the state of [place]. As per [facility] Medical Board telemedicine laws, the patient must reside in the state in which the provider is licensed.   Denyce Robert, NP 08/08/2023, 4:32 PM [facility]

## 2023-08-08 NOTE — Telephone Encounter (Signed)
Patient with diagnosis of A Fib on Eliquis for anticoagulation.    Procedure: Left Total knee Arthroplasty  Date of procedure: TBD   CHA2DS2-VASc Score = 5  This indicates a 7.2% annual risk of stroke. The patient's score is based upon: CHF History: 0 HTN History: 1 Diabetes History: 0 Stroke History: 0 Vascular Disease History: 1 Age Score: 2 Gender Score: 1   CrCl 60 ml/min Platelet count 239K   Per office protocol, patient can hold Eliquis for 3 days prior to procedure.     **This guidance is not considered finalized until pre-operative APP has relayed final recommendations.**

## 2023-08-09 NOTE — Telephone Encounter (Signed)
I left a message for the patient to the office back to schedule a tele visit for pre-op clearance.

## 2023-08-12 ENCOUNTER — Telehealth: Payer: Self-pay | Admitting: *Deleted

## 2023-08-12 NOTE — Telephone Encounter (Signed)
Pt has been scheduled 1st available tele preop appt 08/25/23. Med rec and consent are done.     Patient Consent for Virtual Visit        [name] [name] has provided verbal consent on 08/12/2023 for a virtual visit (video or telephone).   CONSENT FOR VIRTUAL VISIT FOR:  [name] [name]  By participating in this virtual visit I agree to the following:  I hereby voluntarily request, consent and authorize [facility] and its employed or contracted physicians, physician assistants, nurse practitioners or other licensed health care professionals (the Practitioner), to provide me with telemedicine health care services (the "Services") as deemed necessary by the treating Practitioner. I acknowledge and consent to receive the Services by the Practitioner via telemedicine. I understand that the telemedicine visit will involve communicating with the Practitioner through live audiovisual communication technology and the disclosure of certain medical information by electronic transmission. I acknowledge that I have been given the opportunity to request an in-person assessment or other available alternative prior to the telemedicine visit and am voluntarily participating in the telemedicine visit.  I understand that I have the right to withhold or withdraw my consent to the use of telemedicine in the course of my care at any time, without affecting my right to future care or treatment, and that the Practitioner or I may terminate the telemedicine visit at any time. I understand that I have the right to inspect all information obtained and/or recorded in the course of the telemedicine visit and may receive copies of available information for a reasonable fee.  I understand that some of the potential risks of receiving the Services via telemedicine include:  Delay or interruption in medical evaluation due to technological equipment failure or disruption; Information transmitted may not be sufficient (e.g. poor  resolution of images) to allow for appropriate medical decision making by the Practitioner; and/or  In rare instances, security protocols could fail, causing a breach of personal health information.  Furthermore, I acknowledge that it is my responsibility to provide information about my medical history, conditions and care that is complete and accurate to the best of my ability. I acknowledge that Practitioner's advice, recommendations, and/or decision may be based on factors not within their control, such as incomplete or inaccurate data provided by me or distortions of diagnostic images or specimens that may result from electronic transmissions. I understand that the practice of medicine is not an exact science and that Practitioner makes no warranties or guarantees regarding treatment outcomes. I acknowledge that a copy of this consent can be made available to me via my patient portal [facility] MyChart), or I can request a printed copy by calling the office of [facility].    I understand that my insurance will be billed for this visit.   I have read or had this consent read to me. I understand the contents of this consent, which adequately explains the benefits and risks of the Services being provided via telemedicine.  I have been provided ample opportunity to ask questions regarding this consent and the Services and have had my questions answered to my satisfaction. I give my informed consent for the services to be provided through the use of telemedicine in my medical care

## 2023-08-25 ENCOUNTER — Ambulatory Visit: Payer: Medicare PPO | Attending: Cardiology | Admitting: Student

## 2023-08-25 DIAGNOSIS — Z01810 Encounter for preprocedural cardiovascular examination: Secondary | ICD-10-CM | POA: Diagnosis not present

## 2023-08-25 NOTE — Progress Notes (Signed)
 Virtual Visit via Telephone Note   Because of [name] [name]'s co-morbid illnesses, she is at least at moderate risk for complications without adequate follow up.  This format is felt to be most appropriate for this patient at this time.  The patient did not have access to video technology/had technical difficulties with video requiring transitioning to audio format only (telephone).  All issues noted in this document were discussed and addressed.  No physical exam could be performed with this format.  Please refer to the patient's chart for her consent to telehealth for [name] [name].  Evaluation Performed:  Preoperative cardiovascular risk assessment _____________   Date:  08/25/2023   Patient ID:  [name] [name], DOB May 11, 1941, MRN [id number] Patient Location:  Home Provider location:   Office  Primary Care Provider:  Geofm Glade PARAS, MD Primary Cardiologist:  Redell Shallow, MD  Chief Complaint / Patient Profile   83 y.o. y/o female with a h/o permanent A-fib on anticoagulation, aortic atherosclerosis, hypertension, hyperlipidemia, GERD who is pending left total knee arthroplasty by Dr. Vernetta and presents today for telephonic preoperative cardiovascular risk assessment.  History of Present Illness    [name] [name] is a 83 y.o. female who presents via audio/video conferencing for a telehealth visit today.  Pt was last seen in cardiology clinic on 06/02/2023 by Dr. Shallow.  At that time [name] [name] complained of increased DOE.  She underwent echo which showed normal LV function, EF 60 to 65%, no RWMA, mildly elevated PA pressure, moderate BAE, mild to moderate MR, moderate TR, mild AI.SABRA  The patient is now pending procedure as outlined above. Since her last visit, she is doing well. Patient denies orthopnea or PND. She has dyspnea with heavier exertion but none with day to day activities. She manages chronic L>R lower extremity edema with compression stockings. No chest pain,  pressure, or tightness. No palpitations.  She does a lot of walking each day. She has stairs in her home and navigates them several times a day. 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   Past Medical History    Past Medical History:  Diagnosis Date   Allergic rhinitis    takes Benadryl  at bedtime   Allergy    Anemia    Angio-edema    Arthritis    osteoarthritis   Atrial fibrillation (HCC) 06-10-2011   takes Diltiazem  and Pradaxa  daily   Cancer (HCC)    skin   Cataract    bilateral cataracts removed   Clotting disorder (HCC)    on PRADAXA  - chronic a fib   Collagenous colitis    recurrent, takes Budesonide  daily as needed    Colon polyps 01/14/2010   Hyperplastic   Cough    states every day of her life and every chest xray is always clear   GERD (gastroesophageal reflux disease)    takes Omeprazole  daily   History of poliomyelitis    Polio  age 58- no significant neuromuscular deficit   Hyperlipidemia    was on Pravastatin  but joint pain;has been off for 2-15months ;takes CoQ10   Hypertension    takes Bisoprolol  and Chlorthalidone  daily   Potassium deficiency    takes [facility] daily   Past Surgical History:  Procedure Laterality Date   ADENOIDECTOMY     APPENDECTOMY     at age 38   BREAST BIOPSY  1947 & 10/07   Benign   CARDIOVERSION N/A 11/05/2016  Procedure: CARDIOVERSION;  Surgeon: Wilbert JONELLE Bihari, MD;  Location: [facility] ENDOSCOPY;  Service: Cardiovascular;  Laterality: N/A;   cataracts removed     COLONOSCOPY     ENDOVENOUS ABLATION SAPHENOUS VEIN W/ LASER  03-03-2011 left greater saphenous vein    ENDOVENOUS ABLATION SAPHENOUS VEIN W/ LASER  01-06-2011  right greater saphenous vein   epidural infection     x 2    IR RADIOLOGIST EVAL & MGMT  03/07/2017   SKIN CANCER EXCISION     removal of skin cancer on left leg   TONSILLECTOMY     as a child   TONSILLECTOMY     TOTAL KNEE ARTHROPLASTY Right 09/18/2013   Procedure: TOTAL  KNEE ARTHROPLASTY;  Surgeon: Maude LELON Right, MD;  Location: [facility] OR;  Service: Orthopedics;  Laterality: Right;    Allergies  Allergies  Allergen Reactions   Levaquin [Levofloxacin In D5w]     Severe join pain, muscle pain, and tendonitis   Polysporin [Bacitracin-Polymyxin B]     Blisters    Pravastatin  Other (See Comments)    Muscle pain and weakness   Sulfa Antibiotics    Sulfonamide Derivatives Hives    Childhood reaction   Augmentin [Amoxicillin-Pot Clavulanate] Diarrhea   Doxycycline  Rash   Erythromycin Swelling and Rash    Home Medications    Prior to Admission medications   Medication Sig Start Date End Date Taking? Authorizing Provider  acetaminophen  (TYLENOL ) 500 MG tablet Take 1,000 mg by mouth every 6 (six) hours as needed for moderate pain or headache.    [provider]  bisoprolol  (ZEBETA ) 10 MG tablet TAKE 1 TABLET BY MOUTH IN THE MORNING AND 1/2 AT BEDTIME. 07/27/23   Pietro Redell RAMAN, MD  budesonide  (ENTOCORT EC ) 3 MG 24 hr capsule Take 1 capsule (3 mg total) by mouth 3 (three) times daily. Patient taking differently: Take 3 mg by mouth daily as needed (flare up). 10/05/21   Nandigam, Kavitha V, MD  calcium-vitamin D  (OSCAL WITH D) 500-5 MG-MCG tablet Take 1 tablet by mouth.    [provider]  conjugated estrogens (PREMARIN) vaginal cream Premarin 0.625 mg/gram vaginal cream  Insert 0.5 applicatorsful twice a week by vaginal route.    [provider]  dicyclomine  (BENTYL ) 10 MG capsule Take 1 capsule (10 mg total) by mouth 2 (two) times daily as needed for spasms. 10/15/20   Nandigam, Kavitha V, MD  diltiazem  (CARDIZEM  CD) 360 MG 24 hr capsule TAKE ONE CAPSULE BY MOUTH ONCE DAILY 08/01/23   Pietro Redell RAMAN, MD  Doxylamine  Succinate, Sleep, (UNISOM  PO) Take 1 tablet by mouth at bedtime.    [provider]  ELIQUIS  5 MG TABS tablet TAKE ONE TABLET BY MOUTH TWICE DAILY 05/31/23   Pietro Redell RAMAN, MD  famotidine  (PEPCID ) 20 MG  tablet Take 20 mg by mouth 2 (two) times daily.    [provider]  furosemide  (LASIX ) 20 MG tablet Take 1 tablet (20 mg total) by mouth daily as needed for fluid or edema. 01/31/23   Geofm Glade PARAS, MD  spironolactone  (ALDACTONE ) 25 MG tablet TAKE ONE TABLET BY MOUTH EVERY DAY 06/30/23   Pietro Redell RAMAN, MD    Physical Exam    Vital Signs:  [name] [name] does not have vital signs available for review today.  Given telephonic nature of communication, physical exam is limited. AAOx3. NAD. Normal affect.  Speech and respirations are unlabored.  Accessory Clinical Findings    Cardiac Studies & Procedures  ECHOCARDIOGRAM  ECHOCARDIOGRAM COMPLETE 07/21/2023  Narrative ECHOCARDIOGRAM REPORT    Patient Name:   [name] [name]   Date of Exam: 07/21/2023 Medical Rec #:  [id number]     Height:       62.0 in Accession #:    [id number]    Weight:       162.0 lb Date of Birth:  Sep 21, 1940     BSA:          1.748 m Patient Age:    82 years      BP:           145/80 mmHg Patient Gender: F             HR:           74 bpm. Exam Location:  [address]  Procedure: 2D Echo, Cardiac Doppler and Color Doppler  Indications:    I48.91 Atrial Fibrillation  History:        Patient has prior history of Echocardiogram examinations, most recent 09/15/2020. Signs/Symptoms:Edema; Risk Factors:Hypertension and HLD.  Sonographer:    Waldo Guadalajara RCS Referring Phys: 58 BRIAN S CRENSHAW  IMPRESSIONS   1. Left ventricular ejection fraction, by estimation, is 60 to 65%. The left ventricle has normal function. The left ventricle has no regional wall motion abnormalities. There is mild asymmetric left ventricular hypertrophy of the basal-septal segment. Left ventricular diastolic parameters are indeterminate. 2. Right ventricular systolic function is mildly reduced. The right ventricular size is normal. There is mildly elevated pulmonary artery systolic pressure. The estimated right ventricular  systolic pressure is 40.9 mmHg. 3. Left atrial size was mild to moderately dilated. 4. Right atrial size was mild to moderately dilated. 5. The mitral valve is degenerative. Mild to moderate mitral valve regurgitation. 6. Tricuspid valve regurgitation is moderate. 7. The aortic valve is tricuspid. Aortic valve regurgitation is mild. 8. The inferior vena cava is normal in size with <50% respiratory variability, suggesting right atrial pressure of 8 mmHg.  Comparison(s): No significant change from prior study.          Assessment & Plan    Primary Cardiologist: Redell Shallow, MD  Preoperative cardiovascular risk assessment.  Left total knee arthroplasty by Dr. Vernetta.  Chart reviewed as part of pre-operative protocol coverage. According to the RCRI, patient has a 0.4% risk of MACE. 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   Given past medical history and time since last visit, based on ACC/AHA guidelines, [name] [name] would be at acceptable risk for the planned procedure without further cardiovascular testing.   Patient was advised that if she develops new symptoms prior to surgery to contact our office to arrange a follow-up appointment.  she verbalized understanding.  Per Pharm D, patient may hold Eliquis  for 3 days prior to procedure.    I will route this recommendation to the requesting party via Epic fax function.  Please call with questions.  Time:   Today, I have spent 7 minutes with the patient with telehealth technology discussing medical history, symptoms, and management plan.     Barnie Hila, NP  08/25/2023, 9:30 AM

## 2023-08-26 ENCOUNTER — Ambulatory Visit (INDEPENDENT_AMBULATORY_CARE_PROVIDER_SITE_OTHER): Payer: Medicare PPO | Admitting: Podiatrist

## 2023-08-26 DIAGNOSIS — B351 Tinea unguium: Secondary | ICD-10-CM

## 2023-08-26 NOTE — Progress Notes (Signed)
Patient presents today for laser maintenance treatment. Diagnosed with mycotic nail infection by Dr. Allena Katz.   All other systems are negative.  Nails were trimmed and filed thin. Laser therapy was administered to 1-5 toenails bilateral and patient tolerated the treatment well. All safety precautions were in place. Most effected toenails left 1st, right 3rd, right 5th.  Since patient has had multiple rounds of laser, I recommended laser maintenance every 3 months for preventative. She will follow up in 3 months.

## 2023-09-12 ENCOUNTER — Other Ambulatory Visit: Payer: Self-pay

## 2023-09-20 NOTE — Progress Notes (Signed)
 Surgical Instructions   Your procedure is scheduled on Tuesday September 27, 2023. Report to [facility] Main Entrance A at 6:45 A.M., then check in with the Admitting office. Any questions or running late day of surgery: call [phone]  Questions prior to your surgery date: call [phone], Monday-Friday, 8am-4pm. If you experience any cold or flu symptoms such as cough, fever, chills, shortness of breath, etc. between now and your scheduled surgery, please notify us  at the above number.     Remember:  Do not eat after midnight the night before your surgery  You may drink clear liquids until 5:45 the morning of your surgery.   Clear liquids allowed are: Water, Non-Citrus Juices (without pulp), Carbonated Beverages, Clear Tea (no milk, honey, etc.), Black Coffee Only (NO MILK, CREAM OR POWDERED CREAMER of any kind), and Gatorade.  Patient Instructions  The night before surgery:  No food after midnight. ONLY clear liquids after midnight  The day of surgery (if you do NOT have diabetes):  Drink ONE (1) Pre-Surgery Clear Ensure by 5:45 the morning of surgery. Drink in one sitting. Do not sip.  This drink was given to you during your hospital  pre-op appointment visit.  Nothing else to drink after completing the  Pre-Surgery Clear Ensure.         If you have questions, please contact your surgeon's office.    Take these medicines the morning of surgery with A SIP OF WATER  bisoprolol  (ZEBETA )  diltiazem  (CARDIZEM  CD)  famotidine  (PEPCID )   May take these medicines IF NEEDED: acetaminophen  (TYLENOL )    PER YOUR CARDIOLOGIST'S INSTRUCTIONS, HOLD YOUR ELIQUIS   3 DAYS PRIOR TO SURGERY, WITH THE LAST DOSE BEING 09/23/2023.    One week prior to surgery, STOP taking any Aspirin  (unless otherwise instructed by your surgeon) Aleve, Naproxen, Ibuprofen, Motrin, Advil, Goody's, BC's, all herbal medications, fish oil, and non-prescription vitamins.                     Do NOT Smoke  (Tobacco/Vaping) for 24 hours prior to your procedure.  If you use a CPAP at night, you may bring your mask/headgear for your overnight stay.   You will be asked to remove any contacts, glasses, piercing's, hearing aid's, dentures/partials prior to surgery. Please bring cases for these items if needed.    Patients discharged the day of surgery will not be allowed to drive home, and someone needs to stay with them for 24 hours.  SURGICAL WAITING ROOM VISITATION Patients may have no more than 2 support people in the waiting area - these visitors may rotate.   Pre-op nurse will coordinate an appropriate time for 1 ADULT support person, who may not rotate, to accompany patient in pre-op.  Children under the age of 63 must have an adult with them who is not the patient and must remain in the main waiting area with an adult.  If the patient needs to stay at the hospital during part of their recovery, the visitor guidelines for inpatient rooms apply.  Please refer to the [facility] website for the visitor guidelines for any additional information.   If you received a COVID test during your pre-op visit  it is requested that you wear a mask when out in public, stay away from anyone that may not be feeling well and notify your surgeon if you develop symptoms. If you have been in contact with anyone that has tested positive in the last 10 days please  notify you surgeon.      Pre-operative 5 CHG Bathing Instructions   You can play a key role in reducing the risk of infection after surgery. Your skin needs to be as free of germs as possible. You can reduce the number of germs on your skin by washing with CHG (chlorhexidine  gluconate) soap before surgery. CHG is an antiseptic soap that kills germs and continues to kill germs even after washing.   DO NOT use if you have an allergy to chlorhexidine /CHG or antibacterial soaps. If your skin becomes reddened or irritated, stop using the CHG and notify one  of our RNs at [phone].   Please shower with the CHG soap starting 4 days before surgery using the following schedule:     Please keep in mind the following:  DO NOT shave, including legs and underarms, starting the day of your first shower.   You may shave your face at any point before/day of surgery.  Place clean sheets on your bed the day you start using CHG soap. Use a clean washcloth (not used since being washed) for each shower. DO NOT sleep with pets once you start using the CHG.   CHG Shower Instructions:  Wash your face and private area with normal soap. If you choose to wash your hair, wash first with your normal shampoo.  After you use shampoo/soap, rinse your hair and body thoroughly to remove shampoo/soap residue.  Turn the water OFF and apply about 3 tablespoons (45 ml) of CHG soap to a CLEAN washcloth.  Apply CHG soap ONLY FROM YOUR NECK DOWN TO YOUR TOES (washing for 3-5 minutes)  DO NOT use CHG soap on face, private areas, open wounds, or sores.  Pay special attention to the area where your surgery is being performed.  If you are having back surgery, having someone wash your back for you may be helpful. Wait 2 minutes after CHG soap is applied, then you may rinse off the CHG soap.  Pat dry with a clean towel  Put on clean clothes/pajamas   If you choose to wear lotion, please use ONLY the CHG-compatible lotions that are listed below.  Additional instructions for the day of surgery: DO NOT APPLY any lotions, deodorants or perfumes.   Do not bring valuables to the hospital. [facility] is not responsible for any belongings/valuables. Do not wear nail polish, gel polish, artificial nails, or any other type of covering on natural nails (fingers and toes) Do not wear jewelry or makeup Put on clean/comfortable clothes.  Please brush your teeth.  Ask your nurse before applying any prescription medications to the skin.     CHG Compatible Lotions   Aveeno  Moisturizing lotion  Cetaphil Moisturizing Cream  Cetaphil Moisturizing Lotion  Clairol Herbal Essence Moisturizing Lotion, Dry Skin  Clairol Herbal Essence Moisturizing Lotion, Extra Dry Skin  Clairol Herbal Essence Moisturizing Lotion, Normal Skin  Curel Age Defying Therapeutic Moisturizing Lotion with Alpha Hydroxy  Curel Extreme Care Body Lotion  Curel Soothing Hands Moisturizing Hand Lotion  Curel Therapeutic Moisturizing Cream, Fragrance-Free  Curel Therapeutic Moisturizing Lotion, Fragrance-Free  Curel Therapeutic Moisturizing Lotion, Original Formula  Eucerin Daily Replenishing Lotion  Eucerin Dry Skin Therapy Plus Alpha Hydroxy Crme  Eucerin Dry Skin Therapy Plus Alpha Hydroxy Lotion  Eucerin Original Crme  Eucerin Original Lotion  Eucerin Plus Crme Eucerin Plus Lotion  Eucerin TriLipid Replenishing Lotion  Keri Anti-Bacterial Hand Lotion  Keri Deep Conditioning Original Lotion Dry Skin Formula Softly Scented  Keri  Deep Conditioning Original Lotion, Fragrance Free Sensitive Skin Formula  Keri Lotion Fast Absorbing Fragrance Free Sensitive Skin Formula  Keri Lotion Fast Absorbing Softly Scented Dry Skin Formula  Keri Original Lotion  Keri Skin Renewal Lotion Keri Silky Smooth Lotion  Keri Silky Smooth Sensitive Skin Lotion  Nivea Body Creamy Conditioning Oil  Nivea Body Extra Enriched Lotion  Nivea Body Original Lotion  Nivea Body Sheer Moisturizing Lotion Nivea Crme  Nivea Skin Firming Lotion  NutraDerm 30 Skin Lotion  NutraDerm Skin Lotion  NutraDerm Therapeutic Skin Cream  NutraDerm Therapeutic Skin Lotion  ProShield Protective Hand Cream  Provon moisturizing lotion  Please read over the following fact sheets that you were given.

## 2023-09-21 ENCOUNTER — Encounter (HOSPITAL_COMMUNITY): Payer: Self-pay

## 2023-09-21 ENCOUNTER — Encounter (HOSPITAL_COMMUNITY)
Admission: RE | Admit: 2023-09-21 | Discharge: 2023-09-21 | Disposition: A | Discharge status: Home / Self Care | Payer: Medicare PPO | Source: Ambulatory Visit | Attending: Orthopaedic Surgery | Admitting: Orthopaedic Surgery

## 2023-09-21 ENCOUNTER — Other Ambulatory Visit: Payer: Self-pay

## 2023-09-21 VITALS — BP 145/65 | HR 50 | Temp 97.6°F | Resp 18 | Ht 65.0 in | Wt 168.0 lb

## 2023-09-21 DIAGNOSIS — I10 Essential (primary) hypertension: Secondary | ICD-10-CM | POA: Diagnosis not present

## 2023-09-21 DIAGNOSIS — R600 Localized edema: Secondary | ICD-10-CM | POA: Insufficient documentation

## 2023-09-21 DIAGNOSIS — Z01812 Encounter for preprocedural laboratory examination: Secondary | ICD-10-CM | POA: Insufficient documentation

## 2023-09-21 DIAGNOSIS — Z01818 Encounter for other preprocedural examination: Secondary | ICD-10-CM

## 2023-09-21 DIAGNOSIS — E785 Hyperlipidemia, unspecified: Secondary | ICD-10-CM | POA: Insufficient documentation

## 2023-09-21 DIAGNOSIS — I4821 Permanent atrial fibrillation: Secondary | ICD-10-CM | POA: Diagnosis not present

## 2023-09-21 DIAGNOSIS — M1712 Unilateral primary osteoarthritis, left knee: Secondary | ICD-10-CM | POA: Diagnosis not present

## 2023-09-21 DIAGNOSIS — Z7901 Long term (current) use of anticoagulants: Secondary | ICD-10-CM | POA: Diagnosis not present

## 2023-09-21 LAB — BASIC METABOLIC PANEL
Anion gap: 11 (ref 5–15)
BUN: 28 mg/dL — ABNORMAL HIGH (ref 8–23)
CO2: 25 mmol/L (ref 22–32)
Calcium: 9.4 mg/dL (ref 8.9–10.3)
Chloride: 102 mmol/L (ref 98–111)
Creatinine, Ser: 0.96 mg/dL (ref 0.44–1.00)
GFR, Estimated: 59 mL/min — ABNORMAL LOW (ref >60)
Glucose, Bld: 125 mg/dL — ABNORMAL HIGH (ref 70–99)
Potassium: 4.1 mmol/L (ref 3.5–5.1)
Sodium: 138 mmol/L (ref 135–145)

## 2023-09-21 LAB — TYPE AND SCREEN
ABO/RH(D): O NEG
Antibody Screen: NEGATIVE
Weak D: POSITIVE

## 2023-09-21 LAB — CBC
HCT: 39.9 % (ref 36.0–46.0)
Hemoglobin: 13.3 g/dL (ref 12.0–15.0)
MCH: 34.2 pg — ABNORMAL HIGH (ref 26.0–34.0)
MCHC: 33.3 g/dL (ref 30.0–36.0)
MCV: 102.6 fL — ABNORMAL HIGH (ref 80.0–100.0)
Platelets: 277 10*3/uL (ref 150–400)
RBC: 3.89 MIL/uL (ref 3.87–5.11)
RDW: 13.1 % (ref 11.5–15.5)
WBC: 6.7 10*3/uL (ref 4.0–10.5)
nRBC: 0 % (ref 0.0–0.2)

## 2023-09-21 LAB — SURGICAL PCR SCREEN
MRSA, PCR: NEGATIVE
Staphylococcus aureus: NEGATIVE

## 2023-09-21 NOTE — Progress Notes (Signed)
 PCP - Dr. Glade Hope Cardiologist - Dr. Pietro  PPM/ICD - Denies Device Orders - n/a Rep Notified - n/a  Chest x-ray - n/a EKG - 06-01-24 Stress Test - 07-21-23 ECHO - 09-17-20 Cardiac Cath - n/a   Sleep Study - Denies CPAP - n/a  Fasting Blood Sugar - Denies Checks Blood Sugar _____ times a day n/a  Last dose of GLP1 agonist-  Denies GLP1 instructions: n/a  Blood Thinner Instructions: Eliquis  - last dose on 09-22-22 per her cardiologist Aspirin  Instructions:Denies  ERAS Protcol - ERAS till 5:45 PRE-SURGERY Ensure or G2- Ensure   COVID TEST- No   Anesthesia review: Yes, hypertension, a-fib on eliquis , cardiac clearance on 08-25-23.  Patient denies shortness of breath, fever, cough and chest pain at PAT appointment.  Patient denies any respiratory issues at this time.    All instructions explained to the patient, with a verbal understanding of the material. Patient agrees to go over the instructions while at home for a better understanding. Patient also instructed to self quarantine after being tested for COVID-19. The opportunity to ask questions was provided.

## 2023-09-22 NOTE — Progress Notes (Signed)
 Anesthesia Chart Review:  83 year old follows with cardiology for history of HTN, HLD, lower extremity edema, permanent atrial fibrillation on Eliquis . Nuclear study February 2022 showed apical thinning but no ischemia.  Monitor February 2022 showed atrial fibrillation with PVCs or aberrantly conducted beats rate upper normal to mildly increased.  Echo 07/2023 showed normal LV function, mildly elevated PASP 40.9 mmHg, mild AI, mild to moderate MR.  Cardiac clearance per progress note 08/25/2023 by Barnie Hila, NP, Chart reviewed as part of pre-operative protocol coverage. According to the RCRI, patient has a 0.4% risk of MACE. 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 Given past medical history and time since last visit, based on ACC/AHA guidelines, [name] [name] would be at acceptable risk for the planned procedure without further cardiovascular testing.  Other pertinent history includes GERD (on omeprazole ), chronic cough, anemia.  Preop labs reviewed, unremarkable.  EKG 06/02/23: Atrial fibrillation with premature ventricular or aberrantly conducted complexes. Rate 81.  TTE 07/21/2023: 1. Left ventricular ejection fraction, by estimation, is 60 to 65%. The  left ventricle has normal function. The left ventricle has no regional  wall motion abnormalities. There is mild asymmetric left ventricular  hypertrophy of the basal-septal segment.  Left ventricular diastolic parameters are indeterminate.   2. Right ventricular systolic function is mildly reduced. The right  ventricular size is normal. There is mildly elevated pulmonary artery  systolic pressure. The estimated right ventricular systolic pressure is  40.9 mmHg.   3. Left atrial size was mild to moderately dilated.   4. Right atrial size was mild to  moderately dilated.   5. The mitral valve is degenerative. Mild to moderate mitral valve  regurgitation.   6. Tricuspid valve regurgitation is moderate.   7. The aortic valve is tricuspid. Aortic valve regurgitation is mild.   8. The inferior vena cava is normal in size with <50% respiratory  variability, suggesting right atrial pressure of 8 mmHg.   Nuclear stress 09/17/2020: There was no ST segment deviation noted during stress. No T wave inversion was noted during stress. Findings consistent with prior myocardial infarction. This is a low risk study.   1. There is a small (2-3% of LV), mild perfusion defect present in the apical anterior segment on rest and stress imaging. This study was non-gated due to Aifb. The findings may represent a small infarction vs artifact, but unable to determine as this study was not gated.  2. Overall, this is a low-risk study.     Lynwood Geofm RIGGERS [facility] Short Stay Center/Anesthesiology Phone [phone] 09/22/2023 1:59 PM

## 2023-09-22 NOTE — Anesthesia Preprocedure Evaluation (Addendum)
Anesthesia Evaluation  Patient identified by MRN, date of birth, ID band Patient awake    Reviewed: Allergy & Precautions, NPO status , Patient's Chart, lab work & pertinent test results  History of Anesthesia Complications Negative for: history of anesthetic complications  Airway Mallampati: II  TM Distance: >3 FB Neck ROM: Full    Dental  (+) Dental Advisory Given   Pulmonary former smoker   breath sounds clear to auscultation       Cardiovascular hypertension, Pt. on medications (-) angina + dysrhythmias Atrial Fibrillation  Rhythm:Irregular Rate:Normal  07/2023 ECHO: EF 60-65%, mild asymmetric LVH of basal septal segment, mildly reduced RVF, mildly elevated PA pressure, mild-mod MR, mild AI  '22 Stress: no ischemia   Neuro/Psych negative neurological ROS     GI/Hepatic Neg liver ROS,GERD  Medicated and Controlled,,  Endo/Other  negative endocrine ROS    Renal/GU negative Renal ROS     Musculoskeletal  (+) Arthritis ,    Abdominal   Peds  Hematology Eliquis: last dose Friday 2/7   Anesthesia Other Findings   Reproductive/Obstetrics                             Anesthesia Physical Anesthesia Plan  ASA: 3  Anesthesia Plan: Spinal   Post-op Pain Management: Regional block* and Tylenol PO (pre-op)*   Induction: Intravenous  PONV Risk Score and Plan: 2  Airway Management Planned: Natural Airway and Simple Face Mask  Additional Equipment: None  Intra-op Plan:   Post-operative Plan:   Informed Consent: I have reviewed the patients History and Physical, chart, labs and discussed the procedure including the risks, benefits and alternatives for the proposed anesthesia with the patient or authorized representative who has indicated his/her understanding and acceptance.     Dental advisory given  Plan Discussed with: CRNA and Surgeon  Anesthesia Plan Comments: (Plan routine  monitors, SAB with adductor canal block for post op analgesia  PAT note by Antionette Poles, PA-C:  83 year old follows with cardiology for history of HTN, HLD, lower extremity edema, permanent atrial fibrillation on Eliquis. Nuclear study February 2022 showed apical thinning but no ischemia. Monitor February 2022 showed atrial fibrillation with PVCs or aberrantly conducted beats rate upper normal to mildly increased.  Echo 07/2023 showed normal LV function, mildly elevated PASP 40.9 mmHg, mild AI, mild to moderate MR.  Cardiac clearance per progress note 08/25/2023 by Carlos Levering, NP, "Chart reviewed as part of pre-operative protocol coverage. According to the RCRI, patient has a 0.4% risk of MACE. 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 Given past medical history and time since last visit, based on ACC/AHA guidelines, [name] [name] would be at acceptable risk for the planned procedure without further cardiovascular testing."  Other pertinent history includes GERD (on omeprazole), chronic cough, anemia.  Preop labs reviewed, unremarkable.  EKG 06/02/23: Atrial fibrillation with premature ventricular or aberrantly conducted complexes. Rate 81.  TTE 07/21/2023: 1. Left ventricular ejection fraction, by estimation, is 60 to 65%. The  left ventricle has normal function. The left ventricle has no regional  wall motion abnormalities. There is mild asymmetric left ventricular  hypertrophy of the basal-septal segment.  Left ventricular diastolic parameters are indeterminate.  2. Right ventricular systolic function is mildly reduced. The right  ventricular size is normal. There is mildly elevated pulmonary artery  systolic pressure. The estimated right ventricular systolic pressure is  40.9 mmHg.  3. Left atrial size  was mild to moderately dilated.  4. Right atrial size was mild to moderately dilated.  5. The mitral valve is degenerative. Mild to moderate mitral valve  regurgitation.  6. Tricuspid valve regurgitation is moderate.  7. The aortic valve is tricuspid. Aortic valve regurgitation is mild.  8. The inferior vena cava is normal in size with <50% respiratory  variability, suggesting right atrial pressure of 8 mmHg.   Nuclear stress 09/17/2020:  There was no ST segment deviation noted during stress.  No T wave inversion was noted during stress.  Findings consistent with prior myocardial infarction.  This is a low risk study.   1. There is a small (2-3% of LV), mild perfusion defect present in the apical anterior segment on rest and stress imaging. This study was non-gated due to Aifb. The findings may represent a small infarction vs artifact, but unable to determine as this study was not gated.  2. Overall, this is a low-risk study.    )        Anesthesia Quick Evaluation

## 2023-09-26 NOTE — H&P (Signed)
 TOTAL KNEE ADMISSION H&P  Patient is being admitted for left total knee arthroplasty.  Subjective:  Chief Complaint:left knee pain.  HPI: [name] [name], 83 y.o. female, has a history of pain and functional disability in the left knee due to arthritis and has failed non-surgical conservative treatments for greater than 12 weeks to includeNSAID's and/or analgesics, corticosteriod injections, viscosupplementation injections, flexibility and strengthening excercises, use of assistive devices, and activity modification.  Onset of symptoms was gradual, starting 3 years ago withgradually worsening course since that time. The patient noted no past surgery on the left knee(s).  Patient currently rates pain in the left knee(s) at 10 out of 10 with activity. Patient has night pain, worsening of pain with activity and weight bearing, pain that interferes with activities of daily living, pain with passive range of motion, crepitus, and joint swelling.  Patient has evidence of subchondral sclerosis, periarticular osteophytes, and joint space narrowing by imaging studies. There is no active infection.  Patient Active Problem List   Diagnosis Date Noted   Anemia 01/28/2022   Osteopenia 01/28/2022   Allergy to alpha-gal 01/28/2022   Sinus complaint 01/28/2022   B12 deficiency 01/28/2022   Vitamin D  deficiency 01/28/2022   Aortic atherosclerosis (HCC) 11/19/2020   Bladder prolapse, female, acquired 11/12/2019   Chronic sinusitis 12/06/2018   Hyperglycemia 01/03/2017   DDD (degenerative disc disease), lumbar 01/03/2017   Chronic back pain 01/02/2017   Bilateral leg edema 04/13/2016   Fatigue 05/29/2015   Osteoarthritis of right knee 09/21/2013   S/P total knee replacement using cement 09/18/2013   Anisocoria 07/26/2013   Unilateral primary osteoarthritis, left knee 06/09/2012   Varicose veins of bilateral lower extremities with other complications 09/02/2011   Paroxysmal Atrial Fibrillation 06/29/2011    COUGH, CHRONIC 03/09/2010   HIP PAIN, BILATERAL 07/17/2009   ALLERGIC RHINITIS 04/23/2008   GERD 11/01/2007   Essential hypertension 09/20/2007   SKIN CANCER, LEG 07/03/2007   Hyperlipidemia 07/03/2007   Colitis, collagenous 07/03/2007   Past Medical History:  Diagnosis Date   Allergic rhinitis    takes Benadryl  at bedtime   Allergy    Anemia    Angio-edema    Arthritis    osteoarthritis   Atrial fibrillation (HCC) 06/10/2011   takes Diltiazem  and Pradaxa  daily   Cancer (HCC)    skin   Cataract    bilateral cataracts removed   Clotting disorder (HCC)    on PRADAXA  - chronic a fib   Collagenous colitis    recurrent, takes Budesonide  daily as needed    Colon polyps 01/14/2010   Hyperplastic   Cough    states every day of her life and every chest xray is always clear   GERD (gastroesophageal reflux disease)    takes Omeprazole  daily   History of poliomyelitis    Polio  age 4- no significant neuromuscular deficit   Hyperlipidemia    was on Pravastatin  but joint pain;has been off for 2-41months ;takes CoQ10   Hypertension    takes Bisoprolol  and Chlorthalidone  daily   Potassium deficiency    takes KDUR daily    Past Surgical History:  Procedure Laterality Date   ADENOIDECTOMY     APPENDECTOMY     at age 53   BREAST BIOPSY  1947 & 10/07   Benign   CARDIOVERSION N/A 11/05/2016   Procedure: CARDIOVERSION;  Surgeon: Jacqueline Matsu, MD;  Location: [facility];  Service: Cardiovascular;  Laterality: N/A;   cataracts removed     COLONOSCOPY  ENDOVENOUS ABLATION SAPHENOUS VEIN W/ LASER  03-03-2011 left greater saphenous vein    ENDOVENOUS ABLATION SAPHENOUS VEIN W/ LASER  01-06-2011  right greater saphenous vein   epidural infection     x 2    IR RADIOLOGIST EVAL & MGMT  03/07/2017   SKIN CANCER EXCISION     removal of skin cancer on left leg   TONSILLECTOMY     as a child   TONSILLECTOMY     TOTAL KNEE ARTHROPLASTY Right 09/18/2013   Procedure: TOTAL KNEE  ARTHROPLASTY;  Surgeon: Shirlee Dotter, MD;  Location: [facility] OR;  Service: Orthopedics;  Laterality: Right;    No current facility-administered medications for this encounter.   Current Outpatient Medications  Medication Sig Dispense Refill Last Dose/Taking   acetaminophen  (TYLENOL ) 500 MG tablet Take 1,000 mg by mouth every 6 (six) hours as needed for moderate pain or headache.   Taking As Needed   bisoprolol  (ZEBETA ) 10 MG tablet TAKE 1 TABLET BY MOUTH IN THE MORNING AND 1/2 AT BEDTIME. 135 tablet 2 Taking   conjugated estrogens (PREMARIN) vaginal cream Place 1 applicator vaginally daily as needed (prolapse flare).   Taking As Needed   diltiazem  (CARDIZEM  CD) 360 MG 24 hr capsule TAKE ONE CAPSULE BY MOUTH ONCE DAILY 90 capsule 3 Taking   Doxylamine  Succinate, Sleep, (UNISOM  PO) Take 1 tablet by mouth at bedtime.   Taking   ELIQUIS  5 MG TABS tablet TAKE ONE TABLET BY MOUTH TWICE DAILY 180 tablet 1 Taking   famotidine  (PEPCID ) 20 MG tablet Take 20 mg by mouth 2 (two) times daily.   Taking   furosemide  (LASIX ) 20 MG tablet Take 1 tablet (20 mg total) by mouth daily as needed for fluid or edema. 90 tablet 1 Taking As Needed   spironolactone  (ALDACTONE ) 25 MG tablet TAKE ONE TABLET BY MOUTH EVERY DAY 90 tablet 3 Taking   budesonide  (ENTOCORT EC ) 3 MG 24 hr capsule Take 1 capsule (3 mg total) by mouth 3 (three) times daily. (Patient not taking: Reported on 09/16/2023) 90 capsule 1 Not Taking   dicyclomine  (BENTYL ) 10 MG capsule Take 1 capsule (10 mg total) by mouth 2 (two) times daily as needed for spasms. (Patient not taking: Reported on 09/16/2023) 90 capsule 1 Not Taking   Allergies  Allergen Reactions   Levaquin [Levofloxacin In D5w] Other (See Comments)    Severe join pain, muscle pain, and tendonitis   Polysporin [Bacitracin-Polymyxin B] Other (See Comments)    Blisters    Pravastatin  Other (See Comments)    Muscle pain and weakness   Sulfa Antibiotics Hives   Sulfonamide Derivatives Hives     Childhood reaction   Augmentin [Amoxicillin-Pot Clavulanate] Diarrhea   Doxycycline  Rash   Erythromycin Swelling and Rash    Social History   Tobacco Use   Smoking status: Former    Current packs/day: 0.00    Average packs/day: 1 pack/day for 18.0 years (18.0 ttl pk-yrs)    Types: Cigarettes    Start date: 08/16/1969    Quit date: 08/17/1987    Years since quitting: 36.1   Smokeless tobacco: Never   Tobacco comments:    quit smoking 1989  Substance Use Topics   Alcohol use: Yes    Alcohol/week: 7.0 standard drinks of alcohol    Types: 7 Glasses of wine per week    Comment: wine - 1-2 glasses a day    Family History  Problem Relation Age of Onset   Stomach cancer Maternal  Grandfather    Heart disease Paternal Grandfather    Colon cancer Son    Rectal cancer Son 80   Colon cancer Paternal Grandmother    Alcohol abuse Father    Heart disease Father    Stroke Father    Hypertension Father    Hypertension Sister    Other Sister        varicose veins   Peripheral vascular disease Mother    Other Mother        varicose veins   Esophageal cancer Neg Hx    Pancreatic cancer Neg Hx      Review of Systems  Objective:  Physical Exam Vitals reviewed.  Constitutional:      Appearance: Normal appearance. She is normal weight.  HENT:     Head: Normocephalic and atraumatic.  Eyes:     Extraocular Movements: Extraocular movements intact.     Pupils: Pupils are equal, round, and reactive to light.  Cardiovascular:     Rate and Rhythm: Normal rate. Rhythm irregular.  Pulmonary:     Effort: Pulmonary effort is normal.     Breath sounds: Normal breath sounds.  Abdominal:     Palpations: Abdomen is soft.  Musculoskeletal:     Cervical back: Normal range of motion and neck supple.     Left knee: Effusion, bony tenderness and crepitus present. Decreased range of motion. Tenderness present over the medial joint line and lateral joint line. Abnormal alignment and abnormal  meniscus.  Neurological:     Mental Status: She is alert and oriented to person, place, and time.  Psychiatric:        Behavior: Behavior normal.     Vital signs in last 24 hours:    Labs:   Estimated body mass index is 27.96 kg/m as calculated from the following:   Height as of 09/21/23: 5\' 5"  (1.651 m).   Weight as of 09/21/23: 76.2 kg.   Imaging Review Plain radiographs demonstrate severe degenerative joint disease of the left knee(s). The overall alignment ismild varus. The bone quality appears to be good for age and reported activity level.      Assessment/Plan:  End stage arthritis, left knee   The patient history, physical examination, clinical judgment of the provider and imaging studies are consistent with end stage degenerative joint disease of the left knee(s) and total knee arthroplasty is deemed medically necessary. The treatment options including medical management, injection therapy arthroscopy and arthroplasty were discussed at length. The risks and benefits of total knee arthroplasty were presented and reviewed. The risks due to aseptic loosening, infection, stiffness, patella tracking problems, thromboembolic complications and other imponderables were discussed. The patient acknowledged the explanation, agreed to proceed with the plan and consent was signed. Patient is being admitted for inpatient treatment for surgery, pain control, PT, OT, prophylactic antibiotics, VTE prophylaxis, progressive ambulation and ADL's and discharge planning. The patient is planning to be discharged to skilled nursing facility

## 2023-09-27 ENCOUNTER — Ambulatory Visit (HOSPITAL_BASED_OUTPATIENT_CLINIC_OR_DEPARTMENT_OTHER): Payer: Medicare PPO | Admitting: Anesthesiology

## 2023-09-27 ENCOUNTER — Other Ambulatory Visit: Payer: Self-pay

## 2023-09-27 ENCOUNTER — Observation Stay (HOSPITAL_COMMUNITY)
Admission: RE | Admit: 2023-09-27 | Discharge: 2023-09-29 | Disposition: A | Discharge status: Home / Self Care | Payer: Medicare PPO | Attending: Orthopaedic Surgery | Admitting: Orthopaedic Surgery

## 2023-09-27 ENCOUNTER — Ambulatory Visit (HOSPITAL_COMMUNITY): Payer: Medicare PPO

## 2023-09-27 ENCOUNTER — Ambulatory Visit (HOSPITAL_COMMUNITY): Payer: Medicare PPO | Admitting: Physician Assistant

## 2023-09-27 ENCOUNTER — Encounter (HOSPITAL_COMMUNITY): Payer: Self-pay | Admitting: Orthopaedic Surgery

## 2023-09-27 ENCOUNTER — Encounter (HOSPITAL_COMMUNITY)
Admission: RE | Disposition: A | Discharge status: Home / Self Care | Payer: Self-pay | Source: Home / Self Care | Attending: Orthopaedic Surgery

## 2023-09-27 DIAGNOSIS — I10 Essential (primary) hypertension: Secondary | ICD-10-CM

## 2023-09-27 DIAGNOSIS — M1712 Unilateral primary osteoarthritis, left knee: Secondary | ICD-10-CM

## 2023-09-27 DIAGNOSIS — Z96652 Presence of left artificial knee joint: Secondary | ICD-10-CM | POA: Diagnosis not present

## 2023-09-27 DIAGNOSIS — G8918 Other acute postprocedural pain: Secondary | ICD-10-CM | POA: Diagnosis not present

## 2023-09-27 DIAGNOSIS — E785 Hyperlipidemia, unspecified: Secondary | ICD-10-CM

## 2023-09-27 DIAGNOSIS — Z87891 Personal history of nicotine dependence: Secondary | ICD-10-CM | POA: Diagnosis not present

## 2023-09-27 DIAGNOSIS — M1711 Unilateral primary osteoarthritis, right knee: Principal | ICD-10-CM

## 2023-09-27 DIAGNOSIS — Z79899 Other long term (current) drug therapy: Secondary | ICD-10-CM | POA: Diagnosis not present

## 2023-09-27 DIAGNOSIS — F1090 Alcohol use, unspecified, uncomplicated: Secondary | ICD-10-CM | POA: Insufficient documentation

## 2023-09-27 DIAGNOSIS — Z471 Aftercare following joint replacement surgery: Secondary | ICD-10-CM | POA: Diagnosis not present

## 2023-09-27 DIAGNOSIS — M25562 Pain in left knee: Secondary | ICD-10-CM | POA: Diagnosis present

## 2023-09-27 DIAGNOSIS — R609 Edema, unspecified: Secondary | ICD-10-CM | POA: Diagnosis not present

## 2023-09-27 HISTORY — PX: TOTAL KNEE ARTHROPLASTY: SHX125

## 2023-09-27 SURGERY — ARTHROPLASTY, KNEE, TOTAL
Anesthesia: Spinal | Site: Knee | Laterality: Left

## 2023-09-27 MED ORDER — LACTATED RINGERS IV SOLN: INTRAVENOUS | Status: DC | PRN

## 2023-09-27 MED ORDER — FENTANYL CITRATE (PF) 250 MCG/5ML IJ SOLN
INTRAMUSCULAR | Status: AC
Start: 2023-09-27 — End: ?
  Filled 2023-09-27: qty 5

## 2023-09-27 MED ORDER — METHOCARBAMOL 500 MG PO TABS
500.0000 mg | ORAL_TABLET | Freq: Four times a day (QID) | ORAL | Status: DC | PRN
  Administered 2023-09-28 – 2023-09-29 (×3): 500 mg via ORAL
  Filled 2023-09-27 (×3): qty 1

## 2023-09-27 MED ORDER — BUPIVACAINE IN DEXTROSE 0.75-8.25 % IT SOLN
INTRATHECAL | Status: DC | PRN
  Administered 2023-09-27: 12 mg via INTRATHECAL

## 2023-09-27 MED ORDER — FENTANYL CITRATE (PF) 100 MCG/2ML IJ SOLN: 50.0000 ug | Freq: Once | INTRAMUSCULAR | Status: AC

## 2023-09-27 MED ORDER — KETOROLAC TROMETHAMINE 15 MG/ML IJ SOLN
7.5000 mg | Freq: Four times a day (QID) | INTRAMUSCULAR | Status: AC
  Administered 2023-09-27 – 2023-09-28 (×3): 7.5 mg via INTRAVENOUS
  Filled 2023-09-27 (×3): qty 1

## 2023-09-27 MED ORDER — DILTIAZEM HCL ER COATED BEADS 180 MG PO CP24
360.0000 mg | ORAL_CAPSULE | Freq: Every day | ORAL | Status: DC
  Administered 2023-09-28 – 2023-09-29 (×2): 360 mg via ORAL
  Filled 2023-09-27 (×2): qty 2

## 2023-09-27 MED ORDER — METHOCARBAMOL 1000 MG/10ML IJ SOLN: 500.0000 mg | Freq: Four times a day (QID) | INTRAMUSCULAR | Status: DC | PRN

## 2023-09-27 MED ORDER — MENTHOL 3 MG MT LOZG: 1.0000 | LOZENGE | OROMUCOSAL | Status: DC | PRN

## 2023-09-27 MED ORDER — MIDAZOLAM HCL 2 MG/2ML IJ SOLN: 0.5000 mg | Freq: Once | INTRAMUSCULAR | Status: DC | PRN

## 2023-09-27 MED ORDER — HYDROMORPHONE HCL 1 MG/ML IJ SOLN
INTRAMUSCULAR | Status: AC
  Filled 2023-09-27: qty 1

## 2023-09-27 MED ORDER — PHENOL 1.4 % MT LIQD: 1.0000 | OROMUCOSAL | Status: DC | PRN

## 2023-09-27 MED ORDER — PHENYLEPHRINE 80 MCG/ML (10ML) SYRINGE FOR IV PUSH (FOR BLOOD PRESSURE SUPPORT)
PREFILLED_SYRINGE | INTRAVENOUS | Status: DC | PRN
  Administered 2023-09-27 (×2): 80 ug via INTRAVENOUS

## 2023-09-27 MED ORDER — OXYCODONE HCL 5 MG PO TABS: 5.0000 mg | ORAL_TABLET | Freq: Once | ORAL | Status: DC | PRN

## 2023-09-27 MED ORDER — BISOPROLOL FUMARATE 5 MG PO TABS
5.0000 mg | ORAL_TABLET | Freq: Two times a day (BID) | ORAL | Status: DC
  Administered 2023-09-27 – 2023-09-28 (×2): 5 mg via ORAL
  Filled 2023-09-27 (×2): qty 1

## 2023-09-27 MED ORDER — BUPIVACAINE-EPINEPHRINE (PF) 0.25% -1:200000 IJ SOLN
INTRAMUSCULAR | Status: AC
  Filled 2023-09-27: qty 30

## 2023-09-27 MED ORDER — HYDROMORPHONE HCL 1 MG/ML IJ SOLN
0.2500 mg | INTRAMUSCULAR | Status: DC | PRN
Start: 2023-09-27 — End: 2023-09-27
  Administered 2023-09-27: 0.5 mg via INTRAVENOUS
  Administered 2023-09-27: 0.25 mg via INTRAVENOUS
  Administered 2023-09-27 (×2): 0.5 mg via INTRAVENOUS
  Administered 2023-09-27: 0.25 mg via INTRAVENOUS

## 2023-09-27 MED ORDER — HYDROMORPHONE HCL 1 MG/ML IJ SOLN: 0.5000 mg | INTRAMUSCULAR | Status: DC | PRN

## 2023-09-27 MED ORDER — METOCLOPRAMIDE HCL 5 MG/ML IJ SOLN: 5.0000 mg | Freq: Three times a day (TID) | INTRAMUSCULAR | Status: DC | PRN

## 2023-09-27 MED ORDER — 0.9 % SODIUM CHLORIDE (POUR BTL) OPTIME
TOPICAL | Status: DC | PRN
  Administered 2023-09-27: 1000 mL

## 2023-09-27 MED ORDER — MEPERIDINE HCL 25 MG/ML IJ SOLN: 6.2500 mg | INTRAMUSCULAR | Status: DC | PRN

## 2023-09-27 MED ORDER — FAMOTIDINE 20 MG PO TABS
20.0000 mg | ORAL_TABLET | Freq: Two times a day (BID) | ORAL | Status: DC
  Administered 2023-09-27 – 2023-09-29 (×4): 20 mg via ORAL
  Filled 2023-09-27 (×4): qty 1

## 2023-09-27 MED ORDER — BUPIVACAINE-EPINEPHRINE 0.25% -1:200000 IJ SOLN
INTRAMUSCULAR | Status: DC | PRN
Start: 2023-09-27 — End: 2023-09-27
  Administered 2023-09-27: 30 mL

## 2023-09-27 MED ORDER — OXYCODONE HCL 5 MG/5ML PO SOLN: 5.0000 mg | Freq: Once | ORAL | Status: DC | PRN

## 2023-09-27 MED ORDER — OXYCODONE HCL 5 MG PO TABS
10.0000 mg | ORAL_TABLET | ORAL | Status: DC | PRN
  Administered 2023-09-29: 10 mg via ORAL

## 2023-09-27 MED ORDER — ALUM & MAG HYDROXIDE-SIMETH 200-200-20 MG/5ML PO SUSP: 30.0000 mL | ORAL | Status: DC | PRN

## 2023-09-27 MED ORDER — ONDANSETRON HCL 4 MG PO TABS: 4.0000 mg | ORAL_TABLET | Freq: Four times a day (QID) | ORAL | Status: DC | PRN

## 2023-09-27 MED ORDER — ROPIVACAINE HCL 7.5 MG/ML IJ SOLN
INTRAMUSCULAR | Status: DC | PRN
Start: 2023-09-27 — End: 2023-09-27
  Administered 2023-09-27: 20 mL via PERINEURAL

## 2023-09-27 MED ORDER — ACETAMINOPHEN 325 MG PO TABS
325.0000 mg | ORAL_TABLET | Freq: Four times a day (QID) | ORAL | Status: DC | PRN
  Administered 2023-09-28 – 2023-09-29 (×2): 650 mg via ORAL
  Filled 2023-09-27 (×2): qty 2

## 2023-09-27 MED ORDER — ONDANSETRON HCL 4 MG/2ML IJ SOLN: 4.0000 mg | Freq: Four times a day (QID) | INTRAMUSCULAR | Status: DC | PRN

## 2023-09-27 MED ORDER — FENTANYL CITRATE (PF) 100 MCG/2ML IJ SOLN
INTRAMUSCULAR | Status: AC
  Administered 2023-09-27: 50 ug via INTRAVENOUS
  Filled 2023-09-27: qty 2

## 2023-09-27 MED ORDER — TRANEXAMIC ACID-NACL 1000-0.7 MG/100ML-% IV SOLN
1000.0000 mg | INTRAVENOUS | Status: AC
  Administered 2023-09-27: 1000 mg via INTRAVENOUS
  Filled 2023-09-27: qty 100

## 2023-09-27 MED ORDER — CEFAZOLIN SODIUM-DEXTROSE 2-4 GM/100ML-% IV SOLN
2.0000 g | INTRAVENOUS | Status: AC
Start: 2023-09-27 — End: 2023-09-27
  Administered 2023-09-27: 2 g via INTRAVENOUS
  Filled 2023-09-27: qty 100

## 2023-09-27 MED ORDER — OXYCODONE HCL 5 MG PO TABS
5.0000 mg | ORAL_TABLET | ORAL | Status: DC | PRN
  Administered 2023-09-27: 10 mg via ORAL
  Administered 2023-09-28: 5 mg via ORAL
  Administered 2023-09-28: 10 mg via ORAL
  Administered 2023-09-29: 5 mg via ORAL
  Filled 2023-09-27 (×2): qty 2
  Filled 2023-09-27: qty 1
  Filled 2023-09-27 (×2): qty 2

## 2023-09-27 MED ORDER — ACETAMINOPHEN 500 MG PO TABS
1000.0000 mg | ORAL_TABLET | Freq: Once | ORAL | Status: AC
  Administered 2023-09-27: 1000 mg via ORAL
  Filled 2023-09-27: qty 2

## 2023-09-27 MED ORDER — DIPHENHYDRAMINE HCL 12.5 MG/5ML PO ELIX: 12.5000 mg | ORAL_SOLUTION | ORAL | Status: DC | PRN

## 2023-09-27 MED ORDER — DOCUSATE SODIUM 100 MG PO CAPS
100.0000 mg | ORAL_CAPSULE | Freq: Two times a day (BID) | ORAL | Status: DC
  Administered 2023-09-28 – 2023-09-29 (×3): 100 mg via ORAL
  Filled 2023-09-27 (×3): qty 1

## 2023-09-27 MED ORDER — CHLORHEXIDINE GLUCONATE 0.12 % MT SOLN
15.0000 mL | Freq: Once | OROMUCOSAL | Status: AC
  Administered 2023-09-27: 15 mL via OROMUCOSAL
  Filled 2023-09-27: qty 15

## 2023-09-27 MED ORDER — METOCLOPRAMIDE HCL 5 MG PO TABS: 5.0000 mg | ORAL_TABLET | Freq: Three times a day (TID) | ORAL | Status: DC | PRN

## 2023-09-27 MED ORDER — PHENYLEPHRINE HCL-NACL 20-0.9 MG/250ML-% IV SOLN
INTRAVENOUS | Status: DC | PRN
  Administered 2023-09-27: 45 ug/min via INTRAVENOUS

## 2023-09-27 MED ORDER — ORAL CARE MOUTH RINSE: 15.0000 mL | Freq: Once | OROMUCOSAL | Status: AC

## 2023-09-27 MED ORDER — SPIRONOLACTONE 25 MG PO TABS
25.0000 mg | ORAL_TABLET | Freq: Every day | ORAL | Status: DC
  Administered 2023-09-28 – 2023-09-29 (×2): 25 mg via ORAL
  Filled 2023-09-27 (×2): qty 1

## 2023-09-27 MED ORDER — SODIUM CHLORIDE 0.9 % IV SOLN: INTRAVENOUS | Status: AC

## 2023-09-27 MED ORDER — CEFAZOLIN SODIUM-DEXTROSE 2-4 GM/100ML-% IV SOLN
2.0000 g | Freq: Four times a day (QID) | INTRAVENOUS | Status: AC
  Administered 2023-09-27 (×2): 2 g via INTRAVENOUS
  Filled 2023-09-27 (×2): qty 100

## 2023-09-27 MED ORDER — PROPOFOL 500 MG/50ML IV EMUL
INTRAVENOUS | Status: DC | PRN
  Administered 2023-09-27: 75 ug/kg/min via INTRAVENOUS

## 2023-09-27 MED ORDER — APIXABAN 5 MG PO TABS
5.0000 mg | ORAL_TABLET | Freq: Two times a day (BID) | ORAL | Status: DC
  Administered 2023-09-28 – 2023-09-29 (×3): 5 mg via ORAL
  Filled 2023-09-27 (×3): qty 1

## 2023-09-27 MED ORDER — SODIUM CHLORIDE 0.9 % IR SOLN
Status: DC | PRN
  Administered 2023-09-27: 1000 mL

## 2023-09-27 SURGICAL SUPPLY — 60 items
BAG COUNTER SPONGE SURGICOUNT (BAG) ×1 IMPLANT
BANDAGE ESMARK 6X9 LF (GAUZE/BANDAGES/DRESSINGS) ×1 IMPLANT
BLADE SAG 18X100X1.27 (BLADE) ×1 IMPLANT
BNDG ELASTIC 6INX 5YD STR LF (GAUZE/BANDAGES/DRESSINGS) IMPLANT
BNDG ELASTIC 6X5.8 VLCR STR LF (GAUZE/BANDAGES/DRESSINGS) ×2 IMPLANT
BNDG ESMARK 6X9 LF (GAUZE/BANDAGES/DRESSINGS) ×1
BOWL SMART MIX CTS (DISPOSABLE) IMPLANT
CEMENT BONE R 1X40 (Cement) IMPLANT
COMP FEM CEMT PERSONA STD SZ7 (Knees) ×1 IMPLANT
COMP TIB PS KNEE E 0D LT (Joint) ×1 IMPLANT
COMPONENT FEM CMT PRSN STD SZ7 (Knees) IMPLANT
COMPONET TIB PS KNEE E 0D LT (Joint) IMPLANT
COOLER ICEMAN CLASSIC (MISCELLANEOUS) ×1 IMPLANT
COVER SURGICAL LIGHT HANDLE (MISCELLANEOUS) ×1 IMPLANT
CUFF TOURN SGL QUICK 42 (TOURNIQUET CUFF) IMPLANT
CUFF TRNQT CYL 34X4.125X (TOURNIQUET CUFF) ×1 IMPLANT
DRAPE EXTREMITY T 121X128X90 (DISPOSABLE) ×1 IMPLANT
DRAPE HALF SHEET 40X57 (DRAPES) ×1 IMPLANT
DRAPE U-SHAPE 47X51 STRL (DRAPES) ×1 IMPLANT
DURAPREP 26ML APPLICATOR (WOUND CARE) ×1 IMPLANT
ELECT CAUTERY BLADE 6.4 (BLADE) ×1 IMPLANT
ELECT REM PT RETURN 9FT ADLT (ELECTROSURGICAL) ×1
ELECTRODE REM PT RTRN 9FT ADLT (ELECTROSURGICAL) ×1 IMPLANT
FACESHIELD WRAPAROUND (MASK) ×2 IMPLANT
FACESHIELD WRAPAROUND OR TEAM (MASK) ×2 IMPLANT
GAUZE PAD ABD 8X10 STRL (GAUZE/BANDAGES/DRESSINGS) ×1 IMPLANT
GAUZE SPONGE 4X4 12PLY STRL (GAUZE/BANDAGES/DRESSINGS) ×1 IMPLANT
GAUZE XEROFORM 1X8 LF (GAUZE/BANDAGES/DRESSINGS) ×1 IMPLANT
GLOVE BIOGEL PI IND STRL 8 (GLOVE) ×2 IMPLANT
GLOVE ORTHO TXT STRL SZ7.5 (GLOVE) ×1 IMPLANT
GLOVE SURG ORTHO 8.0 STRL STRW (GLOVE) ×1 IMPLANT
GOWN STRL REUS W/ TWL LRG LVL3 (GOWN DISPOSABLE) IMPLANT
GOWN STRL REUS W/ TWL XL LVL3 (GOWN DISPOSABLE) ×2 IMPLANT
IMMOBILIZER KNEE 22 UNIV (SOFTGOODS) ×1 IMPLANT
INSERT TIB ARTSURF SZ 6-7 16 (Joint) IMPLANT
IV NS 1000ML BAXH (IV SOLUTION) ×1 IMPLANT
KIT BASIN OR (CUSTOM PROCEDURE TRAY) ×1 IMPLANT
KIT TURNOVER KIT B (KITS) ×1 IMPLANT
MANIFOLD NEPTUNE II (INSTRUMENTS) ×1 IMPLANT
NDL 18GX1X1/2 (RX/OR ONLY) (NEEDLE) IMPLANT
NEEDLE 18GX1X1/2 (RX/OR ONLY) (NEEDLE) IMPLANT
NS IRRIG 1000ML POUR BTL (IV SOLUTION) ×1 IMPLANT
PACK TOTAL JOINT (CUSTOM PROCEDURE TRAY) ×1 IMPLANT
PAD ARMBOARD 7.5X6 YLW CONV (MISCELLANEOUS) ×1 IMPLANT
PAD COLD SHLDR WRAP-ON (PAD) ×1 IMPLANT
PADDING CAST COTTON 6X4 STRL (CAST SUPPLIES) ×1 IMPLANT
PIN DRILL HDLS TROCAR 75 4PK (PIN) IMPLANT
SCREW FEMALE HEX FIX 25X2.5 (ORTHOPEDIC DISPOSABLE SUPPLIES) IMPLANT
SET HNDPC FAN SPRY TIP SCT (DISPOSABLE) ×1 IMPLANT
SET PAD KNEE POSITIONER (MISCELLANEOUS) ×1 IMPLANT
STAPLER VISISTAT 35W (STAPLE) ×1 IMPLANT
STEM POLY PAT PLY 32M KNEE (Knees) IMPLANT
SUCTION TUBE FRAZIER 10FR DISP (SUCTIONS) ×1 IMPLANT
SUT VIC AB 0 CT1 27XBRD ANBCTR (SUTURE) ×1 IMPLANT
SUT VIC AB 1 CT1 27XBRD ANBCTR (SUTURE) ×2 IMPLANT
SUT VIC AB 2-0 CT1 TAPERPNT 27 (SUTURE) ×2 IMPLANT
SYR 50ML LL SCALE MARK (SYRINGE) IMPLANT
TOWEL GREEN STERILE (TOWEL DISPOSABLE) ×1 IMPLANT
TOWEL GREEN STERILE FF (TOWEL DISPOSABLE) ×1 IMPLANT
TRAY CATH INTERMITTENT SS 16FR (CATHETERS) IMPLANT

## 2023-09-27 NOTE — Evaluation (Signed)
Physical Therapy Evaluation Patient Details Name: [name] [name] MRN: [id number] DOB: May 03, 1941 Today's Date: 09/27/2023  History of Present Illness  83 y.o. female presents to [facility] hospital on 09/27/2023 for elective L TKA. PMH includes HLD, HTN, GERD, PAF.  Clinical Impression  Pt presents to PT with deficits in strength, power, ROM, gait, balance. Pt is mobilizing well s/p TKA, ambulating for short household distances with support of RW. PT provides education on TKR exercise packet. PT will follow up tomorrow for further gait and stair training.        If plan is discharge home, recommend the following: A little help with bathing/dressing/bathroom;Assistance with cooking/housework;Help with stairs or ramp for entrance;Assist for transportation   Can travel by private vehicle        Equipment Recommendations Rolling walker (2 wheels)  Recommendations for Other Services       Functional Status Assessment Patient has had a recent decline in their functional status and demonstrates the ability to make significant improvements in function in a reasonable and predictable amount of time.     Precautions / Restrictions Precautions Precautions: Fall;Knee Precaution Booklet Issued: Yes (comment) Recall of Precautions/Restrictions: Intact Restrictions Weight Bearing Restrictions Per Provider Order: Yes LLE Weight Bearing Per Provider Order: Weight bearing as tolerated      Mobility  Bed Mobility Overal bed mobility: Needs Assistance Bed Mobility: Supine to Sit, Sit to Supine     Supine to sit: Supervision Sit to supine: Contact guard assist        Transfers Overall transfer level: Needs assistance Equipment used: Rolling walker (2 wheels) Transfers: Sit to/from Stand Sit to Stand: Min assist                Ambulation/Gait Ambulation/Gait assistance: Contact guard assist Gait Distance (Feet): 40 Feet Assistive device: Rolling walker (2 wheels) Gait  Pattern/deviations: Step-to pattern Gait velocity: reduced Gait velocity interpretation: <1.8 ft/sec, indicate of risk for recurrent falls   General Gait Details: slowed step-to gait, reduced stance time on LLE  Stairs            Wheelchair Mobility     Tilt Bed    Modified Rankin (Stroke Patients Only)       Balance Overall balance assessment: Needs assistance Sitting-balance support: No upper extremity supported, Feet supported Sitting balance-Leahy Scale: Good     Standing balance support: Bilateral upper extremity supported, Reliant on assistive device for balance Standing balance-Leahy Scale: Poor                               Pertinent Vitals/Pain Pain Assessment Pain Assessment: Faces Faces Pain Scale: Hurts even more Pain Location: L knee Pain Descriptors / Indicators: Grimacing Pain Intervention(s): Monitored during session    Home Living Family/patient expects to be discharged to:: Private residence Living Arrangements: Alone Available Help at Discharge: Family;Available 24 hours/day Type of Home: House Home Access: Stairs to enter Entrance Stairs-Rails: Right Entrance Stairs-Number of Steps: 2 Alternate Level Stairs-Number of Steps: 12 Home Layout: Two level;Able to live on main level with bedroom/bathroom Home Equipment: Rollator (4 wheels);Shower seat;BSC/3in1 (walking stick)      Prior Function Prior Level of Function : Independent/Modified Independent;Driving                     Extremity/Trunk Assessment   Upper Extremity Assessment Upper Extremity Assessment: Overall WFL for tasks assessed    Lower Extremity Assessment Lower Extremity  Assessment: LLE deficits/detail LLE Deficits / Details: post-op ROM and strength deficits as anticipated on POD 0 s/p TKA    Cervical / Trunk Assessment Cervical / Trunk Assessment: Normal  Communication   Communication Communication: No apparent difficulties    Cognition  Arousal: Alert Behavior During Therapy: WFL for tasks assessed/performed   PT - Cognitive impairments: No apparent impairments                         Following commands: Intact       Cueing Cueing Techniques: Verbal cues     General Comments General comments (skin integrity, edema, etc.): VSS on RA    Exercises Other Exercises Other Exercises: PT provides education on TKR exercise packet   Assessment/Plan    PT Assessment Patient needs continued PT services  PT Problem List Decreased strength;Decreased range of motion;Decreased activity tolerance;Decreased balance;Decreased mobility;Decreased knowledge of use of DME;Pain       PT Treatment Interventions DME instruction;Gait training;Stair training;Functional mobility training;Therapeutic activities;Therapeutic exercise;Balance training;Neuromuscular re-education;Patient/family education    PT Goals (Current goals can be found in the Care Plan section)  Acute Rehab PT Goals Patient Stated Goal: to return to independence PT Goal Formulation: With patient Time For Goal Achievement: 10/01/23 Potential to Achieve Goals: Good    Frequency 7X/week     Co-evaluation               AM-PAC PT "6 Clicks" Mobility  Outcome Measure Help needed turning from your back to your side while in a flat bed without using bedrails?: A Little Help needed moving from lying on your back to sitting on the side of a flat bed without using bedrails?: A Little Help needed moving to and from a bed to a chair (including a wheelchair)?: A Little Help needed standing up from a chair using your arms (e.g., wheelchair or bedside chair)?: A Little Help needed to walk in hospital room?: A Little Help needed climbing 3-5 steps with a railing? : A Lot 6 Click Score: 17    End of Session Equipment Utilized During Treatment: Gait belt Activity Tolerance: Patient tolerated treatment well Patient left: in bed;with bed alarm set;with call  bell/phone within reach;with family/visitor present Nurse Communication: Mobility status PT Visit Diagnosis: Other abnormalities of gait and mobility (R26.89);Muscle weakness (generalized) (M62.81);Pain Pain - Right/Left: Left Pain - part of body: Knee    Time: 3664-4034 PT Time Calculation (min) (ACUTE ONLY): 26 min   Charges:   PT Evaluation $PT Eval Low Complexity: 1 Low   PT General Charges $$ ACUTE PT VISIT: 1 Visit         Arlyss Gandy, PT, DPT Acute Rehabilitation Office [phone]   Arlyss Gandy 09/27/2023, 5:53 PM

## 2023-09-27 NOTE — Op Note (Signed)
Operative Note  Date of operation: 09/27/2023 Preoperative diagnosis: Left knee primary osteoarthritis Postoperative diagnosis: Same  Procedure: Left cemented total knee arthroplasty  Implants: Biomet/Zimmer persona cemented knee system Implant Name Type Inv. Item Serial No. Manufacturer Lot No. LRB No. Used Action  CEMENT BONE R 1X40 - [id number] Cement CEMENT BONE R 1X40  ZIMMER RECON(ORTH,TRAU,BIO,SG) [id number] Left 2 Implanted  INSERT TIB ARTSURF SZ 6-7 16 - [id number] Joint INSERT TIB ARTSURF SZ 6-7 16  ZIMMER RECON(ORTH,TRAU,BIO,SG) [id number] Left 1 Implanted  STEM POLY PAT PLY 21M KNEE - [id number] Knees STEM POLY PAT PLY 21M KNEE  ZIMMER RECON(ORTH,TRAU,BIO,SG) [id number] Left 1 Implanted  COMP TIB PS KNEE E 0D LT - [id number] Joint COMP TIB PS KNEE E 0D LT  ZIMMER RECON(ORTH,TRAU,BIO,SG) [id number] Left 1 Implanted  COMP FEM CEMT PERSONA STD SZ7 - [id number] Knees COMP FEM CEMT PERSONA STD SZ7  ZIMMER RECON(ORTH,TRAU,BIO,SG) [id number] Left 1 Implanted   Surgeon: Vanita Panda. Magnus Ivan, MD Assistant: Rexene Edison, PA-C  Anesthesia: #1 left lower extremity adductor canal block, #2 spinal, #3 local Tourniquet time: Under 1 hour EBL: Less than 100 cc Antibiotics: IV Ancef Complications: None  Indications: The patient is an 83 year old active female with debilitating end-stage arthritis involving her left knee.  She has tried and failed conservative treatment for over a year now.  Her left knee x-rays show bone-on-bone wear of that knee and at this point her knee pain is daily and it is detrimentally affecting her mobility, her quality of life and her actives daily living.  She has a remote history of a right total knee arthroplasty that has done well and continues to do well.  She wishes to proceed with a left total knee arthroplasty at this standpoint given the failure conservative treatment and her continued pain.  We agree with this as well.  Having had this before she is fully aware of  the risks of acute blood loss anemia, nerve or vessel injury, fracture, infection, DVT, implant failure, and wound healing issues.  She understands that our goals are hopefully decrease pain, improve mobility, and improve quality of life.  Procedure description: After informed consent was obtained and the appropriate left knee marked, anesthesia obtained a left lower extremity adductor canal block in the holding room.  The patient was then brought to the operating room and set up on the operating table where spinal anesthesia was obtained.  She was laid in a supine position on the operating table and a Foley catheter was placed.  A nonsterile tourniquet is placed around her upper left thigh and her left thigh, knee, leg and ankle were prepped and draped in DuraPrep and sterile drapes including a sterile stockinette.  A timeout was called and she was identified as the correct patient the correct left knee.  An Esmarch was then used to wrap out the leg and the tourniquet was plated 300 mm pressure.  With the knee extended a direct midline incision was made over the patella and carried proximally distally.  Dissection was carried down to the knee joint and a medial parapatellar arthrotomy was made.  With the knee in a flexed position we found significant cartilage wear throughout the knee.  Osteophytes removed from all 3 compartments as well as remnants of the ACL and medial lateral meniscus.  Using intramedullary cutting guide we then made our proximal tibia cut correction of varus and valgus and a 7 degree slope.  We made this cut to take 2 mm off the low side and  we did backed this down to more millimeters.  We then used a intramedullary based cutting guide for distal femur cut setting this for a left knee at 5 degrees externally rotated for 10 mm distal femoral cut.  We made that cut without difficulty and brought the knee back down to full extension and she actually hyperextended with a 10 mm block.  We then  went back to the femur and put a femoral sizing guide based off the epicondylar axis.  Based off of this we chose a size 7 femur.  We put a 4-in-1 cutting block for a size 7 femur and made our anterior and posterior cuts followed our chamfer cuts.  We then backed the tibia and chose a size E left tibial tray for coverage over the tibial plateau segment rotation of the tibial tubercle and the femur.  We did our drill hole and keel punch off of this.  We then trialed our size E left tibia combined with our size 7 left CR standard femur.  We trialed up to a 16 mm medial congruent left polythene insert we are pleased with range of motion and stability without insert.  We then made our patella cut and drilled 3 holes for a size 32 patella button.  Again we are pleased with all trial rotation of the knee with range of motion and stability of the knee.  We then removed all trial instrumentation for the knee and irrigate the knee in normal saline solution.  We then placed Marcaine with epinephrine around the arthrotomy.  Next cement was mixed and we cemented our Biomet/Zimmer persona tibial tray for a left knee size E followed by cementing our size 7 left CR standard femur.  We placed our 16 mm left medial congruent polythene insert and cemented our size 32 patella button.  We held the knee fully extended and compressed while the cemented hardened.  We did remove excess cement debris from the knee.  Once the cemented hardened the tourniquet was let down and hemostasis was obtained with electrocautery.  The arthrotomy was closed with interrupted #1 Vicryl suture followed by 0 Vicryl close the deep tissue and 2-0 Vicryl to close subcutaneous tissue.  Skin was closed with staples.  A padded sterile dressing was applied.  The patient was taken the recovery room in stable condition.  Rexene Edison, PA-C did assist during the entire case and beginning to end and his assistance was crucial and medically necessary for soft tissue  management and retraction, helping guide implant placement and a layered closure of the wound.

## 2023-09-27 NOTE — Discharge Instructions (Signed)

## 2023-09-27 NOTE — Anesthesia Postprocedure Evaluation (Signed)
Anesthesia Post Note  Patient: [name] [name]  Procedure(s) Performed: LEFT TOTAL KNEE ARTHROPLASTY (Left: Knee)     Patient location during evaluation: PACU Anesthesia Type: Spinal Level of consciousness: oriented, awake and alert and patient cooperative Pain management: pain level controlled Vital Signs Assessment: post-procedure vital signs reviewed and stable Respiratory status: spontaneous breathing, respiratory function stable and nonlabored ventilation Cardiovascular status: blood pressure returned to baseline and stable Postop Assessment: no headache, no backache, no apparent nausea or vomiting, patient able to bend at knees and spinal receding Anesthetic complications: no   No notable events documented.  Last Vitals:  Vitals:   09/27/23 1139 09/27/23 1145  BP: 113/60 96/76  Pulse: (!) 54 (!) 54  Resp: 14 10  Temp:    SpO2: 96% 94%    Last Pain:  Vitals:   09/27/23 1128  TempSrc:   PainSc: 8                  Ehren Berisha,E. Maudene Stotler

## 2023-09-27 NOTE — Transfer of Care (Signed)
Immediate Anesthesia Transfer of Care Note  Patient: [name] [name]  Procedure(s) Performed: LEFT TOTAL KNEE ARTHROPLASTY (Left: Knee)  Patient Location: PACU  Anesthesia Type:Spinal  Level of Consciousness: awake, alert , and oriented  Airway & Oxygen Therapy: Patient Spontanous Breathing and Patient connected to face mask oxygen  Post-op Assessment: Report given to RN and Post -op Vital signs reviewed and stable  Post vital signs: Reviewed and stable  Last Vitals:  Vitals Value Taken Time  BP 92/55 09/27/23 1128  Temp    Pulse 48 09/27/23 1131  Resp 14 09/27/23 1131  SpO2 97 % 09/27/23 1131  Vitals shown include unfiled device data.  Last Pain:  Vitals:   09/27/23 0708  TempSrc:   PainSc: 0-No pain         Complications: No notable events documented.

## 2023-09-27 NOTE — Anesthesia Procedure Notes (Signed)
Anesthesia Regional Block: Adductor canal block   Pre-Anesthetic Checklist: , timeout performed,  Correct Patient, Correct Site, Correct Laterality,  Correct Procedure, Correct Position, site marked,  Risks and benefits discussed,  Surgical consent,  Pre-op evaluation,  At surgeon's request and post-op pain management  Laterality: Left and Lower  Prep: chloraprep       Needles:  Injection technique: Single-shot  Needle Type: Echogenic Needle     Needle Length: 9cm  Needle Gauge: 21     Additional Needles:   Procedures:,,,, ultrasound used (permanent image in chart),,    Narrative:  Start time: 09/27/2023 8:14 AM End time: 09/27/2023 8:20 AM Injection made incrementally with aspirations every 5 mL.  Performed by: Personally  Anesthesiologist: Jairo Ben, MD  Additional Notes: Pt identified in Holding room.  Monitors applied. Working IV access confirmed. Timeout, Sterile prep L thigh.  #21ga ECHOgenic Arrow block needle into adductor canal with US guidance.  20cc 0.75% Ropivacaine injected incrementally after negative test dose.  Patient asymptomatic, VSS, no heme aspirated, tolerated well.   Sandford Craze, MD

## 2023-09-27 NOTE — Anesthesia Procedure Notes (Signed)
Spinal  Patient location during procedure: OR End time: 09/27/2023 9:31 AM Reason for block: surgical anesthesia Staffing Performed: anesthesiologist  Anesthesiologist: Jairo Ben, MD Performed by: Jairo Ben, MD Authorized by: Jairo Ben, MD   Preanesthetic Checklist Completed: patient identified, IV checked, site marked, risks and benefits discussed, surgical consent, monitors and equipment checked, pre-op evaluation and timeout performed Spinal Block Patient position: sitting Prep: DuraPrep Patient monitoring: heart rate, cardiac monitor, continuous pulse ox and blood pressure Approach: midline Location: L3-4 Injection technique: single-shot Needle Needle type: Pencan and Introducer  Needle gauge: 24 G Needle length: 9 cm Assessment Sensory level: T4 Events: paresthesia and CSF return Additional Notes Pt identified in Operating room.  Monitors applied. Working IV access confirmed. Sterile prep, drape lumbar spine.  1% lido local L 3,4.  #24ga Pencan into clear CSF L 3,4.  12mg  0.75% Bupivacaine with dextrose injected with asp CSF beginning and end of injection.  Patient paresthesia to R leg, good flow of CSF, but needle withdrawn 1 mm, still great flow, no pain on injection, VSS, no heme aspirated, tolerated well.  Sandford Craze, MD

## 2023-09-27 NOTE — Interval H&P Note (Signed)
History and Physical Interval Note: The patient understands that she is here today for a left total knee replacement to treat her significant left knee arthritis and pain.  There has been no acute or interval change in her medical status.  The risks and benefits of surgery have been discussed in detail and informed consent has been obtained.  The left operative knee has been marked.  09/27/2023 7:05 AM  [name] [name]  has presented today for surgery, with the diagnosis of END STAGE LEFT KNEE ARTHRITIS.  The various methods of treatment have been discussed with the patient and family. After consideration of risks, benefits and other options for treatment, the patient has consented to  Procedure(s): LEFT TOTAL KNEE ARTHROPLASTY (Left) as a surgical intervention.  The patient's history has been reviewed, patient examined, no change in status, stable for surgery.  I have reviewed the patient's chart and labs.  Questions were answered to the patient's satisfaction.     Kathryne Hitch

## 2023-09-28 ENCOUNTER — Encounter (HOSPITAL_COMMUNITY): Payer: Self-pay | Admitting: Orthopaedic Surgery

## 2023-09-28 DIAGNOSIS — Z87891 Personal history of nicotine dependence: Secondary | ICD-10-CM | POA: Diagnosis not present

## 2023-09-28 DIAGNOSIS — Z96652 Presence of left artificial knee joint: Secondary | ICD-10-CM | POA: Diagnosis not present

## 2023-09-28 DIAGNOSIS — F1090 Alcohol use, unspecified, uncomplicated: Secondary | ICD-10-CM | POA: Diagnosis not present

## 2023-09-28 DIAGNOSIS — Z79899 Other long term (current) drug therapy: Secondary | ICD-10-CM | POA: Diagnosis not present

## 2023-09-28 DIAGNOSIS — M1712 Unilateral primary osteoarthritis, left knee: Secondary | ICD-10-CM | POA: Diagnosis not present

## 2023-09-28 LAB — CBC
HCT: 35.5 % — ABNORMAL LOW (ref 36.0–46.0)
Hemoglobin: 12 g/dL (ref 12.0–15.0)
MCH: 34.2 pg — ABNORMAL HIGH (ref 26.0–34.0)
MCHC: 33.8 g/dL (ref 30.0–36.0)
MCV: 101.1 fL — ABNORMAL HIGH (ref 80.0–100.0)
Platelets: 184 10*3/uL (ref 150–400)
RBC: 3.51 MIL/uL — ABNORMAL LOW (ref 3.87–5.11)
RDW: 12.7 % (ref 11.5–15.5)
WBC: 7.8 10*3/uL (ref 4.0–10.5)
nRBC: 0 % (ref 0.0–0.2)

## 2023-09-28 LAB — BASIC METABOLIC PANEL
Anion gap: 12 (ref 5–15)
BUN: 14 mg/dL (ref 8–23)
CO2: 24 mmol/L (ref 22–32)
Calcium: 9 mg/dL (ref 8.9–10.3)
Chloride: 98 mmol/L (ref 98–111)
Creatinine, Ser: 0.91 mg/dL (ref 0.44–1.00)
GFR, Estimated: >60 mL/min (ref >60)
Glucose, Bld: 98 mg/dL (ref 70–99)
Potassium: 4.6 mmol/L (ref 3.5–5.1)
Sodium: 134 mmol/L — ABNORMAL LOW (ref 135–145)

## 2023-09-28 MED ORDER — TRAMADOL HCL 50 MG PO TABS
50.0000 mg | ORAL_TABLET | Freq: Four times a day (QID) | ORAL | Status: DC | PRN
  Administered 2023-09-29: 50 mg via ORAL
  Filled 2023-09-28: qty 1

## 2023-09-28 MED ORDER — METHOCARBAMOL 500 MG PO TABS: 500.0000 mg | ORAL_TABLET | Freq: Four times a day (QID) | ORAL | 1 refills | Status: DC | PRN

## 2023-09-28 MED ORDER — SODIUM CHLORIDE 0.9 % IV BOLUS
500.0000 mL | Freq: Once | INTRAVENOUS | Status: AC
  Administered 2023-09-28: 500 mL via INTRAVENOUS

## 2023-09-28 MED ORDER — OXYCODONE HCL 5 MG PO TABS
5.0000 mg | ORAL_TABLET | ORAL | 0 refills | Status: DC | PRN
Start: 2023-09-28 — End: 2023-10-03

## 2023-09-28 NOTE — Progress Notes (Signed)
Physical Therapy Treatment Patient Details Name: [name] [name] MRN: [id number] DOB: May 19, 1941 Today's Date: 09/28/2023   History of Present Illness 83 y.o. female presents to [facility] hospital on 09/27/2023 for elective L TKA. PMH includes HLD, HTN, GERD, PAF.    PT Comments  Pt received in chair, agreeable to therapy session after premedication for pain, pt standing tolerance limited due to LLE pain and very soft BP (and MAP), RN notified. Pt more alert in PM session and denies dizziness or lightheadedness but does seem to have slower processing as she fatigues with likely BP drop in standing. PTA obtained thigh-high TED hose for pt LLE for comfort as LLE edema was causing increased discomfort when using knee-high TED hose. Pt performed seated/reclined LLE exercises with improved ROM and pain tolerance in afternoon session and HEP handout in room, pt encouraged to continue aggressive LLE ROMAT. Pt continues to benefit from PT services to progress toward functional mobility goals.  Orthostatic Sitting (feet on floor)  BP- Sitting 96/50 (Pt asymptomatic when standing after this reading was taken)   Orthostatic Sitting (feet on floor)  BP- Sitting (!) 95/37 (post-ambulation, pt sitting EOB, asymptomatic)  Pulse- Sitting (!) 43   Orthostatic Lying (head of bed flat)  BP- Lying 102/50 (supine after working with PTA in second session)  Pulse- Lying 55     If plan is discharge home, recommend the following: A little help with bathing/dressing/bathroom;Assistance with cooking/housework;Help with stairs or ramp for entrance;Assist for transportation;A little help with walking and/or transfers   Can travel by private vehicle        Equipment Recommendations  Rolling walker (2 wheels) (DME already delivered to her room)    Recommendations for Other Services       Precautions / Restrictions Precautions Precautions: Fall;Knee Precaution Booklet Issued: Yes (comment) Recall of  Precautions/Restrictions: Intact Precaution/Restrictions Comments: Pt encouraged to perform TID as able, and up to 5x daily if possible. Restrictions Weight Bearing Restrictions Per Provider Order: Yes LLE Weight Bearing Per Provider Order: Weight bearing as tolerated     Mobility  Bed Mobility Overal bed mobility: Needs Assistance Bed Mobility: Sit to Supine     Supine to sit: Min assist Sit to supine: Min assist, Used rails   General bed mobility comments: BLE assist to clear edge of bed, pt able to assist with BUE and some use of bed rail; pt hooking RLE under LLE to assist with lifting over bed but still needs some BLE assist.    Transfers Overall transfer level: Needs assistance Equipment used: Rolling walker (2 wheels) Transfers: Sit to/from Stand Sit to Stand: Min assist           General transfer comment: minA from recliner height, CGA to/from elevated BSC height, and minA for lowering assist to bed height, mod cues for safe UE placement and LLE placement for reduced pain prior to sitting/standing each rep.    Ambulation/Gait Ambulation/Gait assistance: Contact guard assist Gait Distance (Feet): 20 Feet (78ft, seated break in bathroom, 60ft) Assistive device: Rolling walker (2 wheels) Gait Pattern/deviations: Step-to pattern, Decreased weight shift to left, Decreased stance time - left, Knee flexed in stance - left, Antalgic Gait velocity: grossly <0.2 m/s   Pre-gait activities: standing LLE hip flexion x10 reps in RW General Gait Details: Mod cues for safety and step sequencing with effective use of RW to offload pain in LLE. Pt tending to let go of RW prior to reaching destination and needs cues to keep RW around  her hips until her "good" leg reaches chair/bed surface to ensure she is safe.   Stairs Stairs:  (defer for pt safety, pt with soft BP still although appears asymptomatic)           Wheelchair Mobility     Tilt Bed    Modified Rankin (Stroke  Patients Only)       Balance Overall balance assessment: Needs assistance Sitting-balance support: No upper extremity supported, Feet supported Sitting balance-Leahy Scale: Good     Standing balance support: Bilateral upper extremity supported, Reliant on assistive device for balance Standing balance-Leahy Scale: Poor Standing balance comment: heavy reliance on RW support                            Communication Communication Communication: No apparent difficulties  Cognition Arousal: Alert Behavior During Therapy: WFL for tasks assessed/performed   PT - Cognitive impairments: No apparent impairments, Attention, Sequencing                       PT - Cognition Comments: Pt needs mod cues for pursed-lip breathing due to pain but attending to task better in PM session and appears more alert. Following commands: Intact      Cueing Cueing Techniques: Verbal cues  Exercises Total Joint Exercises Ankle Circles/Pumps: AROM, Both, 10 reps, Supine Quad Sets: AROM, Both, 10 reps, Supine Short Arc Quad: AROM, Left, 10 reps (reclined in chair) Heel Slides: AAROM, AROM, Left, 10 reps, Supine Hip ABduction/ADduction: AAROM, Left, 10 reps, Supine Straight Leg Raises: AROM, AAROM, Left, 10 reps, Supine (light AA for improved ROM, no significant quad lag observed) Long Arc Quad: AROM, AAROM, Left, 10 reps, Seated (AA to try to get to TKE) Knee Flexion: AROM, AAROM, Left, 10 reps, Seated (pt able to use RLE for overpressure to assist with L knee flexion ROM) Goniometric ROM: L knee flexion ROM grossly 2 deg to 70 deg sitting in chair Marching in Standing: AROM, Left, 10 reps, Standing Other Exercises Other Exercises: IS brought to room after session per MD order from 2/11.    General Comments General comments (skin integrity, edema, etc.): see BP in comments above; thigh-high TED hose placed on LLE as pt reports calf-height TED hose are putting too much pressure just  below her knee; pt reports more comfort with thigh-high hose. LLE elevated on yellow bone foam and iceman refilled prior to end of session, and replaced over LLE in supine.      Pertinent Vitals/Pain Pain Assessment Pain Assessment: 0-10 Pain Score: 7  Faces Pain Scale: Hurts whole lot Pain Location: L knee and thigh with standing activity Pain Descriptors / Indicators: Grimacing, Operative site guarding, Sore, Sharp Pain Intervention(s): Monitored during session, Repositioned, Premedicated before session, Ice applied    Home Living                          Prior Function            PT Goals (current goals can now be found in the care plan section) Acute Rehab PT Goals Patient Stated Goal: to return to independence PT Goal Formulation: With patient Time For Goal Achievement: 10/01/23 Progress towards PT goals: Progressing toward goals    Frequency    7X/week      PT Plan      Co-evaluation  AM-PAC PT "6 Clicks" Mobility   Outcome Measure  Help needed turning from your back to your side while in a flat bed without using bedrails?: A Little Help needed moving from lying on your back to sitting on the side of a flat bed without using bedrails?: A Little Help needed moving to and from a bed to a chair (including a wheelchair)?: A Little Help needed standing up from a chair using your arms (e.g., wheelchair or bedside chair)?: A Little Help needed to walk in hospital room?: A Lot (mod safety cues) Help needed climbing 3-5 steps with a railing? : Total 6 Click Score: 15    End of Session Equipment Utilized During Treatment: Gait belt;Other (comment) (BLE TED hose) Activity Tolerance: Patient tolerated treatment well;Patient limited by pain;Treatment limited secondary to medical complications (Comment);Other (comment) (low BP (low MAP and SBP/DBP with activity)) Patient left: with call bell/phone within reach;with family/visitor present;in  bed;with bed alarm set (son in room, iceman on LLE, TED hose in place) Nurse Communication: Mobility status;Other (comment) (BP readings) PT Visit Diagnosis: Other abnormalities of gait and mobility (R26.89);Muscle weakness (generalized) (M62.81);Pain Pain - Right/Left: Left Pain - part of body: Knee     Time: 9562-1308 PT Time Calculation (min) (ACUTE ONLY): 56 min  Charges:    $Gait Training: 23-37 mins $Therapeutic Exercise: 8-22 mins $Therapeutic Activity: 8-22 mins PT General Charges $$ ACUTE PT VISIT: 1 Visit                     Latrel Szymczak P., PTA Acute Rehabilitation Services Secure Chat Preferred 9a-5:30pm Office: [phone]    Dorathy Kinsman [facility] 09/28/2023, 4:18 PM

## 2023-09-28 NOTE — Progress Notes (Signed)
Physical Therapy Treatment Patient Details Name: [name] [name] MRN: [id number] DOB: 26-Jun-1941 Today's Date: 09/28/2023   History of Present Illness 83 y.o. female presents to [facility] hospital on 09/27/2023 for elective L TKA. PMH includes HLD, HTN, GERD, PAF.    PT Comments  Pt received in supine, agreeable to therapy session with emphasis on supine/seated LLE exercises per HEP (pt had not remembered to attempt them yet), safety with bed mobility and transfers and gait safety/sequencing with RW. Pt needing dense multimodal cues and increased time to initiate and perform all tasks. Pt also impacted by apparent symptomatic orthostatic hypotension, RN/MD notified. Pt agreeable to sit up in recliner and encouraged to increase food and fluid intake as she reports not eating much since surgery. Pt continues to benefit from PT services to progress toward functional mobility goals.   Orthostatic Sitting  BP- Sitting 105/57  Pulse- Sitting 78  Orthostatic Standing at 0 minutes  BP- Standing at 0 minutes (!) 86/53  Pulse- Standing at 0 minutes 79   Orthostatic Sitting  BP- Sitting 96/50 (after standing reading taken, pt denies symptoms but maintains eyes closed and c/o fatigue and lower back discomfort)  Pulse- Sitting 93     If plan is discharge home, recommend the following: A little help with bathing/dressing/bathroom;Assistance with cooking/housework;Help with stairs or ramp for entrance;Assist for transportation;A little help with walking and/or transfers   Can travel by private vehicle        Equipment Recommendations  Rolling walker (2 wheels) (per chart review BSC and RW already ordered)    Recommendations for Other Services       Precautions / Restrictions Precautions Precautions: Fall;Knee Precaution Booklet Issued: Yes (comment) Recall of Precautions/Restrictions: Intact Precaution/Restrictions Comments: pt reports noncompliance with HEP in AM session, pt encouraged to perform TID as  able, and up to 5x daily if possible. Restrictions Weight Bearing Restrictions Per Provider Order: Yes LLE Weight Bearing Per Provider Order: Weight bearing as tolerated     Mobility  Bed Mobility Overal bed mobility: Needs Assistance Bed Mobility: Supine to Sit     Supine to sit: Min assist     General bed mobility comments: minA from flat bed with no rail use to L side of bed per home set-up. pt needing up to minA for LLE assist (pt able to use gait belt as leg lifter also to assist) and for trunk stability/raising assist, mod cues for sequencing/better technique.    Transfers Overall transfer level: Needs assistance Equipment used: Rolling walker (2 wheels) Transfers: Sit to/from Stand Sit to Stand: Min assist, Contact guard assist, From elevated surface           General transfer comment: CGA initial STS from elevated bed surface, progressing to minA for second STS due to fatigue/pain.    Ambulation/Gait Ambulation/Gait assistance: Contact guard assist, Min assist Gait Distance (Feet): 10 Feet (61ft at bedside, seated break, then 54ft with chair pulled up for her to sit as she fatigues.) Assistive device: Rolling walker (2 wheels) Gait Pattern/deviations: Step-to pattern, Decreased weight shift to left, Decreased stance time - left, Knee flexed in stance - left, Antalgic Gait velocity: grossly <0.2 m/s, distance limited at bedside   Pre-gait activities: standing LLE hip flexion x10 reps in RW General Gait Details: Mod cues for safety, step sequencing and more effective use of RW to offload pain in LLE. Pt tending to keep eyes closed more as she fatigues and demonstrates orthostatic hypotension so distance limited within pt tolerance/for  pt safety.   Stairs Stairs:  (not yet safe to perform)           Wheelchair Mobility     Tilt Bed    Modified Rankin (Stroke Patients Only)       Balance Overall balance assessment: Needs assistance Sitting-balance  support: No upper extremity supported, Feet supported Sitting balance-Leahy Scale: Good     Standing balance support: Bilateral upper extremity supported, Reliant on assistive device for balance Standing balance-Leahy Scale: Poor Standing balance comment: heavy reliance on RW support                            Communication Communication Communication: No apparent difficulties  Cognition Arousal: Alert Behavior During Therapy: WFL for tasks assessed/performed   PT - Cognitive impairments: No apparent impairments, Attention, Sequencing                       PT - Cognition Comments: As pt orthostatic symptoms increase, she tends to keep eyes closed, and lacks insight into soft BP being related to her fatigue/decreased insight into orthostatic symptoms which increases her fall risk. Pt needs mod cues for pursed-lip breathing due to pain and appears to have slow processing as pain and orthostatic symptoms increase. Following commands: Intact      Cueing Cueing Techniques: Verbal cues  Exercises Total Joint Exercises Ankle Circles/Pumps: AROM, Both, 10 reps, Supine Quad Sets: AROM, Both, 10 reps, Supine (5 reps x 2 sets) Heel Slides: AAROM, AROM, Left, 10 reps, Supine Hip ABduction/ADduction: AROM, AAROM, Left, 10 reps, Supine Long Arc Quad: AROM, AAROM, Left, 10 reps, Seated (AA to try to get to TKE) Knee Flexion: AROM, AAROM, Left, 10 reps, Seated (pt able to use RLE for overpressure to assist with L knee flexion ROM) Goniometric ROM: L knee flexion ROM grossly 5 deg to 65 deg Marching in Standing: AROM, Left, 10 reps, Standing Other Exercises Other Exercises: single leg bridges x5 reps in supine    General Comments General comments (skin integrity, edema, etc.): see BP in comments above      Pertinent Vitals/Pain Pain Assessment Pain Assessment: 0-10 Pain Score: 7  Faces Pain Scale: Hurts whole lot Pain Location: L knee, and some low back pain sitting  EOB after standing with soft BP Pain Descriptors / Indicators: Grimacing, Moaning, Operative site guarding, Sore, Sharp Pain Intervention(s): Limited activity within patient's tolerance, Monitored during session, Premedicated before session, Repositioned, Patient requesting pain meds-RN notified, RN gave pain meds during session, Ice applied    Home Living                          Prior Function            PT Goals (current goals can now be found in the care plan section) Acute Rehab PT Goals Patient Stated Goal: to return to independence PT Goal Formulation: With patient Time For Goal Achievement: 10/01/23 Progress towards PT goals: Progressing toward goals    Frequency    7X/week      PT Plan      Co-evaluation              AM-PAC PT "6 Clicks" Mobility   Outcome Measure  Help needed turning from your back to your side while in a flat bed without using bedrails?: A Little Help needed moving from lying on your back to sitting on the  side of a flat bed without using bedrails?: A Little Help needed moving to and from a bed to a chair (including a wheelchair)?: A Little Help needed standing up from a chair using your arms (e.g., wheelchair or bedside chair)?: A Little Help needed to walk in hospital room?: A Little Help needed climbing 3-5 steps with a railing? : Total 6 Click Score: 16    End of Session Equipment Utilized During Treatment: Gait belt Activity Tolerance: Patient tolerated treatment well Patient left: with call bell/phone within reach;with family/visitor present;in chair;with chair alarm set (son in room, iceman on LLE) Nurse Communication: Mobility status PT Visit Diagnosis: Other abnormalities of gait and mobility (R26.89);Muscle weakness (generalized) (M62.81);Pain Pain - Right/Left: Left Pain - part of body: Knee     Time: 1029-1127 PT Time Calculation (min) (ACUTE ONLY): 58 min  Charges:    $Gait Training: 8-22  mins $Therapeutic Exercise: 23-37 mins $Therapeutic Activity: 8-22 mins PT General Charges $$ ACUTE PT VISIT: 1 Visit                     Jesten Cappuccio P., PTA Acute Rehabilitation Services Secure Chat Preferred 9a-5:30pm Office: [phone]    Dorathy Kinsman [facility] 09/28/2023, 11:53 AM

## 2023-09-28 NOTE — Plan of Care (Signed)
  Problem: Education: Goal: Knowledge of General Education information will improve Description: Including pain rating scale, medication(s)/side effects and non-pharmacologic comfort measures Outcome: Progressing   Problem: Health Behavior/Discharge Planning: Goal: Ability to manage health-related needs will improve Outcome: Progressing   Problem: Clinical Measurements: Goal: Ability to maintain clinical measurements within normal limits will improve Outcome: Progressing   Problem: Activity: Goal: Risk for activity intolerance will decrease Outcome: Progressing   Problem: Coping: Goal: Level of anxiety will decrease Outcome: Progressing   Problem: Pain Managment: Goal: General experience of comfort will improve and/or be controlled Outcome: Progressing   Problem: Safety: Goal: Ability to remain free from injury will improve Outcome: Progressing   Problem: Skin Integrity: Goal: Risk for impaired skin integrity will decrease Outcome: Progressing

## 2023-09-28 NOTE — Care Management Obs Status (Signed)
MEDICARE OBSERVATION STATUS NOTIFICATION   Patient Details  Name: [name] [name] MRN: [id number] Date of Birth: [birth year]   Medicare Observation Status Notification Given:  Yes    Lorri Frederick, LCSW 09/28/2023, 12:58 PM

## 2023-09-28 NOTE — Discharge Summary (Signed)
Patient ID: [name] [name] MRN: [id number] DOB/AGE: June 25, 1941 83 y.o.  Admit date: 09/27/2023 Discharge date: 09/29/2023  Admission Diagnoses:  Principal Problem:   Unilateral primary osteoarthritis, left knee Active Problems:   Status post total left knee replacement   Discharge Diagnoses:  Same  Past Medical History:  Diagnosis Date   Allergic rhinitis    takes Benadryl at bedtime   Allergy    Anemia    Angio-edema    Arthritis    osteoarthritis   Atrial fibrillation (HCC) 06/10/2011   takes Diltiazem and Pradaxa daily   Cancer (HCC)    skin   Cataract    bilateral cataracts removed   Clotting disorder (HCC)    on PRADAXA - chronic a fib   Collagenous colitis    recurrent, takes Budesonide daily as needed    Colon polyps 01/14/2010   Hyperplastic   Cough    states every day of her life and every chest xray is always clear   GERD (gastroesophageal reflux disease)    takes Omeprazole daily   History of poliomyelitis    Polio  age 5- no significant neuromuscular deficit   Hyperlipidemia    was on Pravastatin but joint pain;has been off for 2-88months ;takes CoQ10   Hypertension    takes Bisoprolol and Chlorthalidone daily   Potassium deficiency    takes KDUR daily    Surgeries: Procedure(s): LEFT TOTAL KNEE ARTHROPLASTY on 09/27/2023   Consultants:   Discharged Condition: Improved  Hospital Course: [name] [name] is an 83 y.o. female who was admitted 09/27/2023 for operative treatment ofUnilateral primary osteoarthritis, left knee. Patient has severe unremitting pain that affects sleep, daily activities, and work/hobbies. After pre-op clearance the patient was taken to the operating room on 09/27/2023 and underwent  Procedure(s): LEFT TOTAL KNEE ARTHROPLASTY.    Patient was given perioperative antibiotics:  Anti-infectives (From admission, onward)    Start     Dose/Rate Route Frequency Ordered Stop   09/27/23 1700  ceFAZolin (ANCEF) IVPB 2g/100 mL premix         2 g 200 mL/hr over 30 Minutes Intravenous Every 6 hours 09/27/23 1627 09/28/23 0026   09/27/23 0645  ceFAZolin (ANCEF) IVPB 2g/100 mL premix        2 g 200 mL/hr over 30 Minutes Intravenous On call to O.R. 09/27/23 4098 09/27/23 0933        Patient was given sequential compression devices, early ambulation, and chemoprophylaxis to prevent DVT.  Patient benefited maximally from hospital stay and there were no complications.    Recent vital signs: Patient Vitals for the past 24 hrs:  BP Temp Temp src Pulse Resp SpO2  09/28/23 0739 (!) 113/50 98.5 F (36.9 C) Oral 87 18 93 %  09/28/23 0446 (!) 164/73 98.6 F (37 C) Oral 84 18 94 %  09/27/23 1931 132/64 98.5 F (36.9 C) -- 90 -- 93 %  09/27/23 1600 (!) 145/65 -- -- (!) 59 19 93 %  09/27/23 1545 118/80 -- -- 60 (!) 21 92 %  09/27/23 1530 101/76 -- -- 68 (!) 23 90 %  09/27/23 1515 135/76 -- -- 61 17 95 %  09/27/23 1500 127/72 97.7 F (36.5 C) -- 61 14 94 %  09/27/23 1445 135/73 -- -- 66 13 94 %  09/27/23 1430 (!) 145/71 -- -- (!) 57 17 94 %  09/27/23 1415 124/60 -- -- 62 20 94 %  09/27/23 1400 131/68 -- -- 60 16 95 %  09/27/23  1345 133/73 -- -- 65 19 93 %  09/27/23 1330 128/71 -- -- 64 18 96 %  09/27/23 1315 134/68 -- -- 61 15 97 %  09/27/23 1300 (!) 135/59 -- -- 62 18 97 %  09/27/23 1245 (!) 124/58 -- -- (!) 50 15 98 %  09/27/23 1230 119/68 -- -- (!) 55 15 94 %  09/27/23 1215 111/65 -- -- 60 16 94 %  09/27/23 1200 117/60 -- -- 60 17 96 %  09/27/23 1145 96/76 -- -- (!) 54 10 94 %  09/27/23 1139 113/60 -- -- (!) 54 14 96 %  09/27/23 1133 101/62 -- -- (!) 51 11 96 %  09/27/23 1130 (!) 75/62 -- -- (!) 48 12 94 %  09/27/23 1128 (!) 92/55 97.7 F (36.5 C) -- (!) 58 14 94 %  09/27/23 0835 106/62 -- -- 62 18 96 %  09/27/23 0830 117/67 -- -- (!) 59 18 96 %  09/27/23 0825 125/70 -- -- (!) 56 17 95 %     Recent laboratory studies:  Recent Labs    09/28/23 0556  WBC 7.8  HGB 12.0  HCT 35.5*  PLT 184  NA 134*  K 4.6  CL  98  CO2 24  BUN 14  CREATININE 0.91  GLUCOSE 98  CALCIUM 9.0     Discharge Medications:   Allergies as of 09/28/2023       Reactions   Levaquin [levofloxacin In D5w] Other (See Comments)   Severe join pain, muscle pain, and tendonitis   Polysporin [bacitracin-polymyxin B] Other (See Comments)   Blisters    Pravastatin Other (See Comments)   Muscle pain and weakness   Sulfa Antibiotics Hives   Sulfonamide Derivatives Hives   Childhood reaction   Augmentin [amoxicillin-pot Clavulanate] Diarrhea   Doxycycline Rash   Erythromycin Swelling, Rash        Medication List     TAKE these medications    acetaminophen 500 MG tablet Commonly known as: TYLENOL Take 1,000 mg by mouth every 6 (six) hours as needed for moderate pain or headache.   bisoprolol 10 MG tablet Commonly known as: ZEBETA TAKE 1 TABLET BY MOUTH IN THE MORNING AND 1/2 AT BEDTIME.   diltiazem 360 MG 24 hr capsule Commonly known as: CARDIZEM CD TAKE ONE CAPSULE BY MOUTH ONCE DAILY   Eliquis 5 MG Tabs tablet Generic drug: apixaban TAKE ONE TABLET BY MOUTH TWICE DAILY   famotidine 20 MG tablet Commonly known as: PEPCID Take 20 mg by mouth 2 (two) times daily.   furosemide 20 MG tablet Commonly known as: LASIX Take 1 tablet (20 mg total) by mouth daily as needed for fluid or edema.   methocarbamol 500 MG tablet Commonly known as: ROBAXIN Take 1 tablet (500 mg total) by mouth every 6 (six) hours as needed for muscle spasms.   oxyCODONE 5 MG immediate release tablet Commonly known as: Oxy IR/ROXICODONE Take 1-2 tablets (5-10 mg total) by mouth every 4 (four) hours as needed for moderate pain (pain score 4-6) (pain score 4-6).   Premarin vaginal cream Generic drug: conjugated estrogens Place 1 applicator vaginally daily as needed (prolapse flare).   spironolactone 25 MG tablet Commonly known as: ALDACTONE TAKE ONE TABLET BY MOUTH EVERY DAY   UNISOM PO Take 1 tablet by mouth at bedtime.                Durable Medical Equipment  (From admission, onward)  Start     Ordered   09/27/23 1512  DME 3 n 1  Once        09/27/23 1511   09/27/23 1512  DME Walker rolling  Once       Question Answer Comment  Walker: With 5 Inch Wheels   Patient needs a walker to treat with the following condition Status post total left knee replacement      09/27/23 1511            Diagnostic Studies: DG Knee Left Port Result Date: 09/27/2023 CLINICAL DATA:  Status post left knee replacement. EXAM: PORTABLE LEFT KNEE - 1-2 VIEW COMPARISON:  None Available. FINDINGS: Left knee arthroplasty in expected alignment. No periprosthetic lucency or fracture. There has been patellar resurfacing. Recent postsurgical change includes air and edema in the soft tissues and joint space. Anterior skin staples in place. IMPRESSION: Left knee arthroplasty without immediate postoperative complication. Electronically Signed   By: Narda Rutherford M.D.   On: 09/27/2023 12:25    Disposition: Discharge disposition: 06-Home-Health Care Svc          Follow-up Information     Kathryne Hitch, MD Follow up in 2 week(s).   Specialty: Orthopedic Surgery Contact information: [address] [place] [address] [phone]                  Signed: Richardean Canal 09/28/2023, 8:19 AM

## 2023-09-28 NOTE — TOC Initial Note (Signed)
Transition of Care [facility]) - Initial/Assessment Note    Patient Details  Name: [name] [name] MRN: [id number] Date of Birth: 06/03/1941  Transition of Care [facility]) CM/SW Contact:    Epifanio Lesches, RN Phone Number: 09/28/2023, 11:11 AM  Clinical Narrative:                   - S/p L total knee arthroplasty  From home alone. Supportive family ( son/daughters). PTA independent with ADL's. Owns a Museum/gallery exhibitions officer. Orders noted for home health services and DME needs. Pt agreeable. [facility] following for home health PT services, prearranged by provider office. Referral made with [employer] for DME , RW and BSC.  Pt without RX med concerns or transportation issues.  TOC team following and will assist with needs.   Expected Discharge Plan: Home w Home Health Services Barriers to Discharge: Continued Medical Work up (therapy to clear)   Patient Goals and CMS Choice     Choice offered to / list presented to : Patient      Expected Discharge Plan and Services   Discharge Planning Services: CM Consult   Living arrangements for the past 2 months: Single Family Home Expected Discharge Date: 09/28/23               DME Arranged: Bedside commode, Walker rolling DME Agency: [employer] Date DME Agency Contacted: 09/28/23 Time DME Agency Contacted: 1051 Representative spoke with at DME Agency: Vaughan Basta HH Arranged: PT [facility] Agency: [facility] ([facility], [phone]) Date HH Agency Contacted: 09/28/23 Time HH Agency Contacted: 1053 Representative spoke with at [facility] Agency: Amy  Prior Living Arrangements/Services Living arrangements for the past 2 months: Single Family Home Lives with:: Self Patient language and need for interpreter reviewed:: Yes Do you feel safe going back to the place where you live?: Yes      Need for Family Participation in Patient Care: Yes (Comment) Care giver support system in place?: Yes (comment) Current home services: DME ([facility]) Criminal  Activity/Legal Involvement Pertinent to Current Situation/Hospitalization: No - Comment as needed  Activities of Daily Living   ADL Screening (condition at time of admission) Independently performs ADLs?: Yes (appropriate for developmental age) Is the patient deaf or have difficulty hearing?: No Does the patient have difficulty seeing, even when wearing glasses/contacts?: No Does the patient have difficulty concentrating, remembering, or making decisions?: No  Permission Sought/Granted                  Emotional Assessment Appearance:: Appears stated age     Orientation: : Oriented to Self, Oriented to Place, Oriented to  Time, Oriented to Situation Alcohol / Substance Use: Not Applicable Psych Involvement: No (comment)  Admission diagnosis:  Status post total left knee replacement [Z96.652] Patient Active Problem List   Diagnosis Date Noted   Status post total left knee replacement 09/27/2023   Anemia 01/28/2022   Osteopenia 01/28/2022   Allergy to alpha-gal 01/28/2022   Sinus complaint 01/28/2022   B12 deficiency 01/28/2022   Vitamin D deficiency 01/28/2022   Aortic atherosclerosis (HCC) 11/19/2020   Bladder prolapse, female, acquired 11/12/2019   Chronic sinusitis 12/06/2018   Hyperglycemia 01/03/2017   DDD (degenerative disc disease), lumbar 01/03/2017   Chronic back pain 01/02/2017   Bilateral leg edema 04/13/2016   Fatigue 05/29/2015   Osteoarthritis of right knee 09/21/2013   S/P total knee replacement using cement 09/18/2013   Anisocoria 07/26/2013   Unilateral primary osteoarthritis, left knee 06/09/2012   Varicose  veins of bilateral lower extremities with other complications 09/02/2011   Paroxysmal Atrial Fibrillation 06/29/2011   COUGH, CHRONIC 03/09/2010   HIP PAIN, BILATERAL 07/17/2009   ALLERGIC RHINITIS 04/23/2008   GERD 11/01/2007   Essential hypertension 09/20/2007   SKIN CANCER, LEG 07/03/2007   Hyperlipidemia 07/03/2007   Colitis,  collagenous 07/03/2007   PCP:  Pincus Sanes, MD Pharmacy:   [facility] - [place], [place] - [address] [place] [address] Phone: [phone] Fax: [phone]     Social Drivers of Health (SDOH) Social History: SDOH Screenings   Food Insecurity: No Food Insecurity (09/27/2023)  Housing: Low Risk  (09/27/2023)  Transportation Needs: No Transportation Needs (09/27/2023)  Utilities: Not At Risk (09/27/2023)  Alcohol Screen: Low Risk  (04/15/2023)  Depression (PHQ2-9): Low Risk  (04/15/2023)  Financial Resource Strain: Low Risk  (04/15/2023)  Physical Activity: Inactive (04/15/2023)  Social Connections: Moderately Integrated (09/27/2023)  Stress: No Stress Concern Present (04/15/2023)  Tobacco Use: Medium Risk (09/27/2023)  Health Literacy: Adequate Health Literacy (04/15/2023)   SDOH Interventions:     Readmission Risk Interventions     No data to display

## 2023-09-28 NOTE — Progress Notes (Signed)
Subjective: 1 Day Post-Op Procedure(s) (LRB): LEFT TOTAL KNEE ARTHROPLASTY (Left) Patient reports pain as moderate.  Reports some chills this AM otherwise no complaints.   Objective: Vital signs in last 24 hours: Temp:  [97.7 F (36.5 C)-98.6 F (37 C)] 98.5 F (36.9 C) (02/12 0739) Pulse Rate:  [48-90] 87 (02/12 0739) Resp:  [10-23] 18 (02/12 0739) BP: (75-164)/(50-80) 113/50 (02/12 0739) SpO2:  [90 %-98 %] 93 % (02/12 0739)  Intake/Output from previous day: 02/11 0701 - 02/12 0700 In: 879.2 [I.V.:600.4; IV Piggyback:278.8] Out: 1825 [Urine:1800; Blood:25] Intake/Output this shift: No intake/output data recorded.  Recent Labs    09/28/23 0556  HGB 12.0   Recent Labs    09/28/23 0556  WBC 7.8  RBC 3.51*  HCT 35.5*  PLT 184   Recent Labs    09/28/23 0556  NA 134*  K 4.6  CL 98  CO2 24  BUN 14  CREATININE 0.91  GLUCOSE 98  CALCIUM 9.0   No results for input(s): "LABPT", "INR" in the last 72 hours.  Dorsiflexion/Plantar flexion intact Incision: scant drainage Compartment soft   Assessment/Plan: 1 Day Post-Op Procedure(s) (LRB): LEFT TOTAL KNEE ARTHROPLASTY (Left) Up with therapy Possible discharge later today . If patient remains stable and does well with PT. Has 2 steps to get into home.  Monitor BP      Wallace Gappa 09/28/2023, 7:57 AM

## 2023-09-28 NOTE — Progress Notes (Signed)
    Durable Medical Equipment  (From admission, onward)           Start     Ordered   09/28/23 1049  DME Walker rolling  Once       Comments: Confine to one room  Question Answer Comment  Walker: With 5 Inch Wheels   Patient needs a walker to treat with the following condition Status post total left knee replacement      09/28/23 1049   09/28/23 1049  DME 3 n 1  Once       Comments: Bedside commode, confine to one room   09/28/23 1049

## 2023-09-29 ENCOUNTER — Ambulatory Visit: Payer: Medicare PPO | Admitting: Nurse Practitioner

## 2023-09-29 DIAGNOSIS — M1712 Unilateral primary osteoarthritis, left knee: Secondary | ICD-10-CM | POA: Diagnosis not present

## 2023-09-29 NOTE — Progress Notes (Signed)
PT Cancellation Note  Patient Details Name: [name] [name] MRN: [id number] DOB: 09-12-1940   Cancelled Treatment:    Reason Eval/Treat Not Completed: (P) Pain limiting ability to participate (PTA checked with RN regarding working with Ms Kimbley at 9:04 AM as she has been pain limited in therapy sessions and has needed premedication, PTA given clearance to work with pt. When PTA arrived to room at 10:11AM, pt requesting pain meds, RN notified.) Will continue efforts per PT plan of care later in AM after pain better controlled.   Drew Herman M Maki Sweetser 09/29/2023, 10:15 AM

## 2023-09-29 NOTE — Plan of Care (Signed)
Problem: Education: Goal: Knowledge of General Education information will improve Description: Including pain rating scale, medication(s)/side effects and non-pharmacologic comfort measures Outcome: Progressing   Problem: Health Behavior/Discharge Planning: Goal: Ability to manage health-related needs will improve Outcome: Progressing   Problem: Clinical Measurements: Goal: Ability to maintain clinical measurements within normal limits will improve Outcome: Progressing   Problem: Activity: Goal: Risk for activity intolerance will decrease Outcome: Progressing   Problem: Nutrition: Goal: Adequate nutrition will be maintained Outcome: Progressing   Problem: Coping: Goal: Level of anxiety will decrease Outcome: Progressing   Problem: Pain Managment: Goal: General experience of comfort will improve and/or be controlled Outcome: Progressing

## 2023-09-29 NOTE — Progress Notes (Signed)
Physical Therapy Treatment Patient Details Name: [name] [name] MRN: [id number] DOB: 1940-09-08 Today's Date: 09/29/2023   History of Present Illness 83 y.o. female presents to [facility] hospital on 09/27/2023 for elective L TKA. PMH includes HLD, HTN, GERD, PAF.    PT Comments  Pt received in supine, agreeable to therapy session after premedication for pain by RN, pt with improved activity tolerance this date and BP stable supine and sitting without any symptoms of low BP during extensive standing activity. Pt needing up to minA for transfers from low surface height, but from typical elevated surfaces (such as bed), pt needing only CGA and safety cues. Pt and son had many questions about bathroom set-up and safe body mechanics, PTA reviewed shower transfer safety and fall risk prevention as well as options for guarding positions and pt has gait belt for home, son able to demo back safe use of gait belt. Pt performed curb height step and 7" platform step with RW support to simulate home entry with mostly CGA. Pt reporting feeling very moderately fatigued (6-7/10 modified RPE) at end of session after all gait trials/stair training complete, RN/MD notified pt safe to DC home from a functional mobility standpoint once medically cleared, pt and son agreeable to plan. Pt continues to benefit from PT services to progress toward functional mobility goals.     If plan is discharge home, recommend the following: A little help with bathing/dressing/bathroom;Assistance with cooking/housework;Help with stairs or ramp for entrance;Assist for transportation;A little help with walking and/or transfers   Can travel by private vehicle        Equipment Recommendations  Rolling walker (2 wheels) (DME already delivered to her room)    Recommendations for Other Services       Precautions / Restrictions Precautions Precautions: Fall;Knee Precaution Booklet Issued: Yes (comment) Recall of Precautions/Restrictions:  Intact Precaution/Restrictions Comments: Pt encouraged to perform TID as able, and up to 5x daily if possible; decreased carryover of instruction for supine exercises from previous session, PTA reviewed again with her prior to OOB mobility. Restrictions Weight Bearing Restrictions Per Provider Order: Yes LLE Weight Bearing Per Provider Order: Weight bearing as tolerated     Mobility  Bed Mobility Overal bed mobility: Needs Assistance Bed Mobility: Supine to Sit     Supine to sit: Contact guard     General bed mobility comments: Increased time/effort from flat bed without needing to use rail, pt shown how to use gait belt as leg lifter to assist with hip abduction toward side of bed.    Transfers Overall transfer level: Needs assistance Equipment used: Rolling walker (2 wheels) Transfers: Sit to/from Stand Sit to Stand: Min assist, Contact guard assist, From elevated surface           General transfer comment: from lower surface height (low bed and recliner chair) pt needing up to minA; from elevated bed surface (to simulate home set-up), pt standing with only CGA.    Ambulation/Gait Ambulation/Gait assistance: Contact guard assist, +2 safety/equipment Gait Distance (Feet): 70 Feet (11ft to bathroom, then 71ft, then 57ft, seated breaks between) Assistive device: Rolling walker (2 wheels) Gait Pattern/deviations: Step-to pattern, Decreased weight shift to left, Decreased stance time - left, Antalgic Gait velocity: grossly <0.2 m/s     General Gait Details: Intermittent cues for RW use, step sequencing,   Stairs             Wheelchair Mobility     Tilt Bed    Modified Rankin (Stroke Patients  Only)       Balance Overall balance assessment: Needs assistance Sitting-balance support: No upper extremity supported, Feet supported Sitting balance-Leahy Scale: Good     Standing balance support: Bilateral upper extremity supported, Reliant on assistive device  for balance Standing balance-Leahy Scale: Poor Standing balance comment: heavy reliance on RW support                            Communication Communication Communication: No apparent difficulties  Cognition Arousal: Alert Behavior During Therapy: WFL for tasks assessed/performed   PT - Cognitive impairments: No apparent impairments, Attention, Sequencing                         Following commands: Intact      Cueing Cueing Techniques: Verbal cues  Exercises Total Joint Exercises Ankle Circles/Pumps: AROM, Both, 10 reps, Supine Quad Sets: AROM, Both, Supine, 5 reps Towel Squeeze: AROM, Both, 5 reps, Supine Short Arc Quad: AROM, Left, 10 reps, AAROM, Supine (AA progressing to AROM) Heel Slides: AAROM, AROM, Left, 10 reps, Supine (AROM x5 reps, AA x5 reps for improved ROM, cues for pursed-lip breathing) Hip ABduction/ADduction: AAROM, Left, Supine, 5 reps (pt using gait belt to assist with abduction vs PTA AA to abduct leg getting to EOB) Straight Leg Raises: AAROM, Left, Supine, 5 reps (light AA for improved ROM, no significant quad lag observed) Long Arc Quad: AROM, Left, 10 reps, Seated Knee Flexion: AROM, Left, 10 reps, Seated (pt able to use RLE for overpressure to assist with L knee flexion ROM) Goniometric ROM: L knee flexion ROM grossly 2 deg to 75 deg sitting, to 60 deg supine    General Comments General comments (skin integrity, edema, etc.): BP 126/74 (88) HR 87 bpm sitting EOB prior to standing and no symptoms of lightheadedness or nausea or weakness during extensive standing/gait trials.      Pertinent Vitals/Pain Pain Assessment Pain Assessment: Faces Faces Pain Scale: Hurts even more Pain Location: L knee and thigh with standing activity and ROM exercises Pain Descriptors / Indicators: Grimacing, Operative site guarding, Sore, Sharp Pain Intervention(s): Monitored during session, Premedicated before session, Repositioned, Ice applied     Home Living                          Prior Function            PT Goals (current goals can now be found in the care plan section) Acute Rehab PT Goals Patient Stated Goal: to return to independence PT Goal Formulation: With patient Time For Goal Achievement: 10/01/23 Progress towards PT goals: Progressing toward goals    Frequency    7X/week      PT Plan      Co-evaluation              AM-PAC PT "6 Clicks" Mobility   Outcome Measure  Help needed turning from your back to your side while in a flat bed without using bedrails?: A Little Help needed moving from lying on your back to sitting on the side of a flat bed without using bedrails?: A Little Help needed moving to and from a bed to a chair (including a wheelchair)?: A Little Help needed standing up from a chair using your arms (e.g., wheelchair or bedside chair)?: A Little Help needed to walk in hospital room?: A Little Help needed climbing 3-5 steps with a  railing? : A Little 6 Click Score: 18    End of Session Equipment Utilized During Treatment: Gait belt;Other (comment) (BLE TED hose, pt has extra longer sock and shorter knee high sock for home as she is wearing longer sock on surgical leg due to pain/swelling) Activity Tolerance: Patient tolerated treatment well Patient left: with call bell/phone within reach;with family/visitor present;in chair;with chair alarm set (son in room, iceman on LLE, TED hose in place) Nurse Communication: Mobility status;Other (comment) (pt ready for home, she needs help getting dressed) PT Visit Diagnosis: Other abnormalities of gait and mobility (R26.89);Muscle weakness (generalized) (M62.81);Pain Pain - Right/Left: Left Pain - part of body: Knee     Time: 0981-1914 PT Time Calculation (min) (ACUTE ONLY): 55 min  Charges:    $Gait Training: 23-37 mins $Therapeutic Exercise: 8-22 mins $Therapeutic Activity: 8-22 mins PT General Charges $$ ACUTE PT  VISIT: 1 Visit                     Lakota Markgraf P., PTA Acute Rehabilitation Services Secure Chat Preferred 9a-5:30pm Office: [phone]    Dorathy Kinsman [facility] 09/29/2023, 12:29 PM

## 2023-09-29 NOTE — Progress Notes (Signed)
Patient ID: [name] [name], female   DOB: [birth year], 83 y.o.   MRN: [id number] The patient does look better today and reports that she feels better.  Her blood pressure has come up appropriately.  She has been up to the bathroom but has not been up with therapy today yet.  If she does do well with therapy today, she can be discharged to home this afternoon.  Her left operative knee is otherwise stable.  The dressing is clean and dry.  Her son is at the bedside as well.  They agree with this treatment plan if she is doing well.

## 2023-09-30 ENCOUNTER — Ambulatory Visit: Payer: Self-pay | Admitting: Internal Medicine

## 2023-09-30 NOTE — Telephone Encounter (Signed)
Chief Complaint: constipation  Symptoms: no BM in 4 days   Disposition: [] ED /[] Urgent Care (no appt availability in office) / [] Appointment(In office/virtual)/ []  [facility]/ [x] Home Care/ [] Refused Recommended Disposition /[] [facility]/ []  Follow-up with PCP Additional Notes: Pt calling with concerns of constipation. Pt had knee replacement surgery on Tuesday and hasn't had BM since Monday night. Pt came home hospital last night and was given colace while there.  Pt states PT is coming to her house and she needs to take pain medication in order to complete PT. Pt is eating very little- states no appetite. Pt states she has lots of gas, but no urge for BM.  Pt is wanting to know home care tips to prevent constipation. RN advised to drink 6-8 cups of water, walk a few laps each hour, prune juice,and eat high fiber foods. Pt verbalized understanding and stated she will try  those suggestions and call back if needed.               Copied from CRM [id number]. Topic: Clinical - Red Word Triage >> Sep 30, 2023 11:45 AM Kathryne Eriksson wrote: Red Word that prompted transfer to Nurse Triage: Bowel Movement >> Sep 30, 2023 11:46 AM Kathryne Eriksson wrote: Patient states she had a knee replacement on Tuesday and haven't had a bowel movement since Monday. Patient states she isn't experiencing any other symptoms.  Reason for Disposition  MILD constipation  Answer Assessment - Initial Assessment Questions 1. STOOL PATTERN OR FREQUENCY: "How often do you have a bowel movement (BM)?"  (Normal range: 3 times a day to every 3 days)  "When was your last BM?"       daily 2. STRAINING: "Do you have to strain to have a BM?"      Rarely  3. RECTAL PAIN: "Does your rectum hurt when the stool comes out?" If Yes, ask: "Do you have hemorrhoids? How bad is the pain?"  (Scale 1-10; or mild, moderate, severe)     Denies  4. STOOL COMPOSITION: "Are the stools hard?"      No  5. BLOOD ON  STOOLS: "Has there been any blood on the toilet tissue or on the surface of the BM?" If Yes, ask: "When was the last time?"     No  6. CHRONIC CONSTIPATION: "Is this a new problem for you?"  If No, ask: "How long have you had this problem?" (days, weeks, months)      no 7. CHANGES IN DIET OR HYDRATION: "Have there been any recent changes in your diet?" "How much fluids are you drinking on a daily basis?"  "How much have you had to drink today?"     Eating very little  8. MEDICINES: "Have you been taking any new medicines?" "Are you taking any narcotic pain medicines?" (e.g., Dilaudid, morphine, Percocet, Vicodin) Oxycodone 5mg       9. LAXATIVES: "Have you been using any stool softeners, laxatives, or enemas?"  If Yes, ask "What, how often, and when was the last time?"     no 10. ACTIVITY:  "How much walking do you do every day?"  "Has your activity level decreased in the past week?"        No- knee replacement  11. CAUSE: "What do you think is causing the constipation?"        Knee surgery on Tuesday  12. OTHER SYMPTOMS: "Do you have any other symptoms?" (e.g., abdomen pain, bloating, fever, vomiting)  gas 13. MEDICAL HISTORY: "Do you have a history of hemorrhoids, rectal fissures, or rectal surgery or rectal abscess?"         hemorrhoids  Protocols used: Constipation-A-AH

## 2023-10-03 ENCOUNTER — Telehealth: Payer: Self-pay | Admitting: *Deleted

## 2023-10-03 ENCOUNTER — Other Ambulatory Visit: Payer: Self-pay | Admitting: Orthopaedic Surgery

## 2023-10-03 ENCOUNTER — Telehealth: Payer: Self-pay | Admitting: Internal Medicine

## 2023-10-03 MED ORDER — OXYCODONE HCL 5 MG PO TABS
5.0000 mg | ORAL_TABLET | Freq: Four times a day (QID) | ORAL | 0 refills | Status: DC | PRN
Start: 2023-10-03 — End: 2023-10-19

## 2023-10-03 NOTE — Telephone Encounter (Signed)
Copied from CRM [id number]. Topic: Referral - Request for Referral >> Oct 03, 2023 11:03 AM Phill Myron wrote: Rehab services [facility] stated to patient they have no one available, due staff shortages.   Pt [place] would like to suggest: 1. [facility] [phone] and 2. [facility]  Home Health [phone]

## 2023-10-03 NOTE — Telephone Encounter (Signed)
Spoke with patient and she is doing well even though her HHPT has been delayed since being home. Working on it now and if no success today, will refer to OPPT in her area. She is requesting a refill of pain medication. Thank you.

## 2023-10-03 NOTE — Telephone Encounter (Signed)
Spoke with patient.  This was suppose to go to Ortho since they just did surgery on patient.  She said she reached out to [facility] today and Friday.

## 2023-10-05 ENCOUNTER — Encounter: Payer: Self-pay | Admitting: Internal Medicine

## 2023-10-05 DIAGNOSIS — K5900 Constipation, unspecified: Secondary | ICD-10-CM | POA: Insufficient documentation

## 2023-10-05 NOTE — Progress Notes (Unsigned)
Subjective:    Patient ID: [name] [name], female    DOB: 25-Dec-1940, 83 y.o.   MRN: [id number]     HPI L[place] is here for follow up from the hospital   Admitted 09/27/23 - 09/29/23  Admitted for operative treatment of unilateral primary osteoarthritis of left knee.  Had severe unremitting pain that affected her ADLs.  Left total knee arthroplasty 09/27/2023.  There are no complications.  Discharged 2/13.  After discharge she was experiencing constipation.  She was given Colace while in the hospital.  PT was coming to her home had needed to take the pain medication or complete PT which was worsening constipation.  She was eating very little due to decreased appetite.  She stated a lot of gas.  Nurse triage advised increase water intake, walking, prune juice, high-fiber foods.  Medications and allergies reviewed with patient and updated if appropriate.  Current Outpatient Medications on File Prior to Visit  Medication Sig Dispense Refill   acetaminophen (TYLENOL) 500 MG tablet Take 1,000 mg by mouth every 6 (six) hours as needed for moderate pain or headache.     bisoprolol (ZEBETA) 10 MG tablet TAKE 1 TABLET BY MOUTH IN THE MORNING AND 1/2 AT BEDTIME. 135 tablet 2   conjugated estrogens (PREMARIN) vaginal cream Place 1 applicator vaginally daily as needed (prolapse flare).     diltiazem (CARDIZEM CD) 360 MG 24 hr capsule TAKE ONE CAPSULE BY MOUTH ONCE DAILY 90 capsule 3   Doxylamine Succinate, Sleep, (UNISOM PO) Take 1 tablet by mouth at bedtime.     ELIQUIS 5 MG TABS tablet TAKE ONE TABLET BY MOUTH TWICE DAILY 180 tablet 1   famotidine (PEPCID) 20 MG tablet Take 20 mg by mouth 2 (two) times daily.     furosemide (LASIX) 20 MG tablet Take 1 tablet (20 mg total) by mouth daily as needed for fluid or edema. 90 tablet 1   methocarbamol (ROBAXIN) 500 MG tablet Take 1 tablet (500 mg total) by mouth every 6 (six) hours as needed for muscle spasms. 40 tablet 1   oxyCODONE (OXY IR/ROXICODONE) 5  MG immediate release tablet Take 1-2 tablets (5-10 mg total) by mouth every 6 (six) hours as needed for moderate pain (pain score 4-6) (pain score 4-6). 30 tablet 0   spironolactone (ALDACTONE) 25 MG tablet TAKE ONE TABLET BY MOUTH EVERY DAY 90 tablet 3   No current facility-administered medications on file prior to visit.     Review of Systems     Objective:  There were no vitals filed for this visit. BP Readings from Last 3 Encounters:  09/29/23 126/74  09/21/23 (!) 145/65  06/02/23 130/80   Wt Readings from Last 3 Encounters:  09/27/23 160 lb (72.6 kg)  09/21/23 168 lb (76.2 kg)  07/25/23 160 lb (72.6 kg)   There is no height or weight on file to calculate BMI.    Physical Exam     Lab Results  Component Value Date   WBC 7.8 09/28/2023   HGB 12.0 09/28/2023   HCT 35.5 (L) 09/28/2023   PLT 184 09/28/2023   GLUCOSE 98 09/28/2023   CHOL 218 (H) 01/31/2023   TRIG 115.0 01/31/2023   HDL 60.30 01/31/2023   LDLDIRECT 128.6 12/09/2010   LDLCALC 134 (H) 01/31/2023   ALT 16 01/31/2023   AST 19 01/31/2023   NA 134 (L) 09/28/2023   K 4.6 09/28/2023   CL 98 09/28/2023   CREATININE 0.91 09/28/2023  BUN 14 09/28/2023   CO2 24 09/28/2023   TSH 0.93 01/31/2023   INR 1.4 (H) 11/30/2021   HGBA1C 5.4 01/31/2023     Assessment & Plan:    See Problem List for Assessment and Plan of chronic medical problems.

## 2023-10-07 ENCOUNTER — Ambulatory Visit: Payer: Medicare PPO | Admitting: Internal Medicine

## 2023-10-07 VITALS — BP 118/76 | HR 50 | Temp 98.1°F | Ht 65.0 in | Wt 162.0 lb

## 2023-10-07 DIAGNOSIS — Z471 Aftercare following joint replacement surgery: Secondary | ICD-10-CM | POA: Diagnosis not present

## 2023-10-07 DIAGNOSIS — Z96652 Presence of left artificial knee joint: Secondary | ICD-10-CM | POA: Diagnosis not present

## 2023-10-07 DIAGNOSIS — I482 Chronic atrial fibrillation, unspecified: Secondary | ICD-10-CM | POA: Diagnosis not present

## 2023-10-07 DIAGNOSIS — D649 Anemia, unspecified: Secondary | ICD-10-CM | POA: Diagnosis not present

## 2023-10-07 DIAGNOSIS — R600 Localized edema: Secondary | ICD-10-CM

## 2023-10-07 DIAGNOSIS — I4821 Permanent atrial fibrillation: Secondary | ICD-10-CM | POA: Diagnosis not present

## 2023-10-07 DIAGNOSIS — K219 Gastro-esophageal reflux disease without esophagitis: Secondary | ICD-10-CM | POA: Diagnosis not present

## 2023-10-07 DIAGNOSIS — I10 Essential (primary) hypertension: Secondary | ICD-10-CM | POA: Diagnosis not present

## 2023-10-07 DIAGNOSIS — K5909 Other constipation: Secondary | ICD-10-CM

## 2023-10-07 DIAGNOSIS — M40204 Unspecified kyphosis, thoracic region: Secondary | ICD-10-CM | POA: Diagnosis not present

## 2023-10-07 DIAGNOSIS — D689 Coagulation defect, unspecified: Secondary | ICD-10-CM | POA: Diagnosis not present

## 2023-10-07 DIAGNOSIS — K52831 Collagenous colitis: Secondary | ICD-10-CM | POA: Diagnosis not present

## 2023-10-07 NOTE — Assessment & Plan Note (Signed)
Acute Related to recent surgery and pain medication Using prune juice as needed which is helping to keep everything controlled

## 2023-10-07 NOTE — Assessment & Plan Note (Signed)
Chronic Stable Left leg was more swollen just after surgery, but that has improved Continue furosemide 20 mg daily as needed only  -may need to take more often during summer

## 2023-10-07 NOTE — Patient Instructions (Signed)
        Medications changes include :   None

## 2023-10-07 NOTE — Assessment & Plan Note (Signed)
Chronic Blood pressure well controlled Continue bisoprolol 10 mg in the morning, 5 mg at bedtime, diltiazem 360 mg daily, spironolactone 25 mg daily

## 2023-10-07 NOTE — Assessment & Plan Note (Signed)
S/p LTKR 09/27/23 Doing well - to start PT today Pain controlled with oxycodone 5 mg as needed and tylenol as needed

## 2023-10-07 NOTE — Assessment & Plan Note (Signed)
Chronic ?Asymptomatic ?Following with cardiology ?Continue bisoprolol, Cardizem and Eliquis ?

## 2023-10-10 ENCOUNTER — Encounter: Payer: Self-pay | Admitting: Orthopaedic Surgery

## 2023-10-10 ENCOUNTER — Ambulatory Visit (INDEPENDENT_AMBULATORY_CARE_PROVIDER_SITE_OTHER): Payer: Medicare PPO | Admitting: Orthopaedic Surgery

## 2023-10-10 DIAGNOSIS — Z96652 Presence of left artificial knee joint: Secondary | ICD-10-CM

## 2023-10-10 NOTE — Progress Notes (Signed)
 The patient is here today for first postoperative visit status post a left total knee replacement.  She is 83 years old.  Family is with her today as well.  She is on chronic Eliquis and continues on that.  She is also on methocarbamol and oxycodone but does not need a refill.  She has been going through home therapy.  They actually can work with her several more weeks.  On exam today her extension is almost full and her flexion is to just past 90 degrees of that left knee.  The knee is swollen to be expected but her calf is soft.  The incision looks good.  Staples were removed and Steri-Strips applied.  She will continue home health therapy and can transition to outpatient therapy which I gave her a prescription for if it is warranted.  We will see her back in 4 weeks to see how she is doing overall but no x-rays are needed.

## 2023-10-11 DIAGNOSIS — I482 Chronic atrial fibrillation, unspecified: Secondary | ICD-10-CM | POA: Diagnosis not present

## 2023-10-11 DIAGNOSIS — M40204 Unspecified kyphosis, thoracic region: Secondary | ICD-10-CM | POA: Diagnosis not present

## 2023-10-11 DIAGNOSIS — Z471 Aftercare following joint replacement surgery: Secondary | ICD-10-CM | POA: Diagnosis not present

## 2023-10-11 DIAGNOSIS — D689 Coagulation defect, unspecified: Secondary | ICD-10-CM | POA: Diagnosis not present

## 2023-10-11 DIAGNOSIS — K219 Gastro-esophageal reflux disease without esophagitis: Secondary | ICD-10-CM | POA: Diagnosis not present

## 2023-10-11 DIAGNOSIS — K52831 Collagenous colitis: Secondary | ICD-10-CM | POA: Diagnosis not present

## 2023-10-11 DIAGNOSIS — D649 Anemia, unspecified: Secondary | ICD-10-CM | POA: Diagnosis not present

## 2023-10-11 DIAGNOSIS — Z96652 Presence of left artificial knee joint: Secondary | ICD-10-CM | POA: Diagnosis not present

## 2023-10-11 DIAGNOSIS — I10 Essential (primary) hypertension: Secondary | ICD-10-CM | POA: Diagnosis not present

## 2023-10-14 DIAGNOSIS — K52831 Collagenous colitis: Secondary | ICD-10-CM | POA: Diagnosis not present

## 2023-10-14 DIAGNOSIS — I10 Essential (primary) hypertension: Secondary | ICD-10-CM | POA: Diagnosis not present

## 2023-10-14 DIAGNOSIS — M40204 Unspecified kyphosis, thoracic region: Secondary | ICD-10-CM | POA: Diagnosis not present

## 2023-10-14 DIAGNOSIS — D649 Anemia, unspecified: Secondary | ICD-10-CM | POA: Diagnosis not present

## 2023-10-14 DIAGNOSIS — Z471 Aftercare following joint replacement surgery: Secondary | ICD-10-CM | POA: Diagnosis not present

## 2023-10-14 DIAGNOSIS — Z96652 Presence of left artificial knee joint: Secondary | ICD-10-CM | POA: Diagnosis not present

## 2023-10-14 DIAGNOSIS — I482 Chronic atrial fibrillation, unspecified: Secondary | ICD-10-CM | POA: Diagnosis not present

## 2023-10-14 DIAGNOSIS — D689 Coagulation defect, unspecified: Secondary | ICD-10-CM | POA: Diagnosis not present

## 2023-10-14 DIAGNOSIS — K219 Gastro-esophageal reflux disease without esophagitis: Secondary | ICD-10-CM | POA: Diagnosis not present

## 2023-10-18 DIAGNOSIS — Z471 Aftercare following joint replacement surgery: Secondary | ICD-10-CM | POA: Diagnosis not present

## 2023-10-18 DIAGNOSIS — M40204 Unspecified kyphosis, thoracic region: Secondary | ICD-10-CM | POA: Diagnosis not present

## 2023-10-18 DIAGNOSIS — Z96652 Presence of left artificial knee joint: Secondary | ICD-10-CM | POA: Diagnosis not present

## 2023-10-18 DIAGNOSIS — I482 Chronic atrial fibrillation, unspecified: Secondary | ICD-10-CM | POA: Diagnosis not present

## 2023-10-18 DIAGNOSIS — K52831 Collagenous colitis: Secondary | ICD-10-CM | POA: Diagnosis not present

## 2023-10-18 DIAGNOSIS — D689 Coagulation defect, unspecified: Secondary | ICD-10-CM | POA: Diagnosis not present

## 2023-10-18 DIAGNOSIS — I10 Essential (primary) hypertension: Secondary | ICD-10-CM | POA: Diagnosis not present

## 2023-10-18 DIAGNOSIS — K219 Gastro-esophageal reflux disease without esophagitis: Secondary | ICD-10-CM | POA: Diagnosis not present

## 2023-10-18 DIAGNOSIS — D649 Anemia, unspecified: Secondary | ICD-10-CM | POA: Diagnosis not present

## 2023-10-19 ENCOUNTER — Other Ambulatory Visit: Payer: Self-pay | Admitting: Orthopaedic Surgery

## 2023-10-19 ENCOUNTER — Telehealth: Payer: Self-pay | Admitting: Orthopaedic Surgery

## 2023-10-19 MED ORDER — OXYCODONE HCL 5 MG PO TABS
5.0000 mg | ORAL_TABLET | Freq: Four times a day (QID) | ORAL | 0 refills | Status: DC | PRN
Start: 2023-10-19 — End: 2023-12-17

## 2023-10-19 NOTE — Telephone Encounter (Signed)
 Patient called. Would like a refill on oxycodone. Her cb# [phone]

## 2023-10-20 DIAGNOSIS — I482 Chronic atrial fibrillation, unspecified: Secondary | ICD-10-CM | POA: Diagnosis not present

## 2023-10-20 DIAGNOSIS — I10 Essential (primary) hypertension: Secondary | ICD-10-CM | POA: Diagnosis not present

## 2023-10-20 DIAGNOSIS — M40204 Unspecified kyphosis, thoracic region: Secondary | ICD-10-CM | POA: Diagnosis not present

## 2023-10-20 DIAGNOSIS — Z471 Aftercare following joint replacement surgery: Secondary | ICD-10-CM | POA: Diagnosis not present

## 2023-10-20 DIAGNOSIS — K219 Gastro-esophageal reflux disease without esophagitis: Secondary | ICD-10-CM | POA: Diagnosis not present

## 2023-10-20 DIAGNOSIS — K52831 Collagenous colitis: Secondary | ICD-10-CM | POA: Diagnosis not present

## 2023-10-20 DIAGNOSIS — Z96652 Presence of left artificial knee joint: Secondary | ICD-10-CM | POA: Diagnosis not present

## 2023-10-20 DIAGNOSIS — D689 Coagulation defect, unspecified: Secondary | ICD-10-CM | POA: Diagnosis not present

## 2023-10-20 DIAGNOSIS — D649 Anemia, unspecified: Secondary | ICD-10-CM | POA: Diagnosis not present

## 2023-10-24 DIAGNOSIS — D689 Coagulation defect, unspecified: Secondary | ICD-10-CM | POA: Diagnosis not present

## 2023-10-24 DIAGNOSIS — Z471 Aftercare following joint replacement surgery: Secondary | ICD-10-CM | POA: Diagnosis not present

## 2023-10-24 DIAGNOSIS — Z96652 Presence of left artificial knee joint: Secondary | ICD-10-CM | POA: Diagnosis not present

## 2023-10-24 DIAGNOSIS — D649 Anemia, unspecified: Secondary | ICD-10-CM | POA: Diagnosis not present

## 2023-10-24 DIAGNOSIS — K52831 Collagenous colitis: Secondary | ICD-10-CM | POA: Diagnosis not present

## 2023-10-24 DIAGNOSIS — I482 Chronic atrial fibrillation, unspecified: Secondary | ICD-10-CM | POA: Diagnosis not present

## 2023-10-24 DIAGNOSIS — I10 Essential (primary) hypertension: Secondary | ICD-10-CM | POA: Diagnosis not present

## 2023-10-24 DIAGNOSIS — K219 Gastro-esophageal reflux disease without esophagitis: Secondary | ICD-10-CM | POA: Diagnosis not present

## 2023-10-24 DIAGNOSIS — M40204 Unspecified kyphosis, thoracic region: Secondary | ICD-10-CM | POA: Diagnosis not present

## 2023-10-26 ENCOUNTER — Ambulatory Visit: Payer: Medicare PPO | Admitting: Physician Assistant

## 2023-11-01 DIAGNOSIS — D689 Coagulation defect, unspecified: Secondary | ICD-10-CM | POA: Diagnosis not present

## 2023-11-01 DIAGNOSIS — D649 Anemia, unspecified: Secondary | ICD-10-CM | POA: Diagnosis not present

## 2023-11-01 DIAGNOSIS — Z96652 Presence of left artificial knee joint: Secondary | ICD-10-CM | POA: Diagnosis not present

## 2023-11-01 DIAGNOSIS — K52831 Collagenous colitis: Secondary | ICD-10-CM | POA: Diagnosis not present

## 2023-11-01 DIAGNOSIS — I10 Essential (primary) hypertension: Secondary | ICD-10-CM | POA: Diagnosis not present

## 2023-11-01 DIAGNOSIS — Z471 Aftercare following joint replacement surgery: Secondary | ICD-10-CM | POA: Diagnosis not present

## 2023-11-01 DIAGNOSIS — M40204 Unspecified kyphosis, thoracic region: Secondary | ICD-10-CM | POA: Diagnosis not present

## 2023-11-01 DIAGNOSIS — I482 Chronic atrial fibrillation, unspecified: Secondary | ICD-10-CM | POA: Diagnosis not present

## 2023-11-01 DIAGNOSIS — K219 Gastro-esophageal reflux disease without esophagitis: Secondary | ICD-10-CM | POA: Diagnosis not present

## 2023-11-03 DIAGNOSIS — M40204 Unspecified kyphosis, thoracic region: Secondary | ICD-10-CM | POA: Diagnosis not present

## 2023-11-03 DIAGNOSIS — K219 Gastro-esophageal reflux disease without esophagitis: Secondary | ICD-10-CM | POA: Diagnosis not present

## 2023-11-03 DIAGNOSIS — D649 Anemia, unspecified: Secondary | ICD-10-CM | POA: Diagnosis not present

## 2023-11-03 DIAGNOSIS — K52831 Collagenous colitis: Secondary | ICD-10-CM | POA: Diagnosis not present

## 2023-11-03 DIAGNOSIS — Z96652 Presence of left artificial knee joint: Secondary | ICD-10-CM | POA: Diagnosis not present

## 2023-11-03 DIAGNOSIS — I10 Essential (primary) hypertension: Secondary | ICD-10-CM | POA: Diagnosis not present

## 2023-11-03 DIAGNOSIS — I482 Chronic atrial fibrillation, unspecified: Secondary | ICD-10-CM | POA: Diagnosis not present

## 2023-11-03 DIAGNOSIS — D689 Coagulation defect, unspecified: Secondary | ICD-10-CM | POA: Diagnosis not present

## 2023-11-03 DIAGNOSIS — Z471 Aftercare following joint replacement surgery: Secondary | ICD-10-CM | POA: Diagnosis not present

## 2023-11-07 ENCOUNTER — Ambulatory Visit (INDEPENDENT_AMBULATORY_CARE_PROVIDER_SITE_OTHER): Payer: Medicare PPO | Admitting: Orthopaedic Surgery

## 2023-11-07 ENCOUNTER — Telehealth: Payer: Self-pay | Admitting: Orthopaedic Surgery

## 2023-11-07 ENCOUNTER — Encounter: Payer: Self-pay | Admitting: Orthopaedic Surgery

## 2023-11-07 DIAGNOSIS — Z96652 Presence of left artificial knee joint: Secondary | ICD-10-CM

## 2023-11-07 MED ORDER — TRAMADOL HCL 50 MG PO TABS: 50.0000 mg | ORAL_TABLET | Freq: Two times a day (BID) | ORAL | 0 refills | Status: DC | PRN

## 2023-11-07 NOTE — Telephone Encounter (Signed)
 Sent to Dr. Magnus Ivan.

## 2023-11-07 NOTE — Telephone Encounter (Signed)
**Note De-identified  [place] Obfuscation** Please advise 

## 2023-11-07 NOTE — Progress Notes (Signed)
 The patient is a very active 83 year old female who is 6 weeks status post a left total knee arthroplasty.  She is doing well overall.  She just ambulates using a cane.  She has been able to meet all of her goals with therapy.  She still reports significant left knee swelling and left leg swelling and has been wearing compressive garments.  She still has some problems sleeping at night as well.  We talked about her trying tramadol and she has had this in the past so we will send in a prescription for tramadol.  On exam her extension is full and her flexion is full.  The knee feels ligamentously stable.  There is swelling to be expected and her calf is soft.  She will still wear compressive garments due to the fact that she still swells quite a bit.  I will send in tramadol for her.  From my standpoint the next time we see her is in 3 months.  Will have a standing AP and lateral of her left knee at that visit.

## 2023-11-07 NOTE — Telephone Encounter (Signed)
 Pt stated while at her visit with Dr. Magnus Ivan today she forgot to ask for a prescription for PT.  Pt would like to have the prescription to be able to go to [facility] in [place], [place] and it just needs to say OT/PT for after knee replacement for strengthen and balance is what she stated her therapist requested for her to get a order for.  Pt would like for the prescription to be mailed to her address on file when ready.

## 2023-11-08 ENCOUNTER — Encounter: Payer: Self-pay | Admitting: Orthopaedic Surgery

## 2023-11-08 ENCOUNTER — Other Ambulatory Visit: Payer: Self-pay | Admitting: Radiology

## 2023-11-08 DIAGNOSIS — Z96652 Presence of left artificial knee joint: Secondary | ICD-10-CM

## 2023-11-08 NOTE — Telephone Encounter (Signed)
 Faxed & mailed

## 2023-11-29 DIAGNOSIS — R2689 Other abnormalities of gait and mobility: Secondary | ICD-10-CM | POA: Diagnosis not present

## 2023-11-29 DIAGNOSIS — Z96652 Presence of left artificial knee joint: Secondary | ICD-10-CM | POA: Diagnosis not present

## 2023-11-29 DIAGNOSIS — Z4789 Encounter for other orthopedic aftercare: Secondary | ICD-10-CM | POA: Diagnosis not present

## 2023-11-29 DIAGNOSIS — M25562 Pain in left knee: Secondary | ICD-10-CM | POA: Diagnosis not present

## 2023-12-01 NOTE — Progress Notes (Signed)
 HPI: FU AFib. Previously had cardioversion but did not hold sinus rhythm and now atrial fibrillation is managed with rate control and anticoagulation. Nuclear study February 2022 showed apical thinning but no ischemia.  Monitor February 2022 showed atrial fibrillation with PVCs or aberrantly conducted beats rate upper normal to mildly increased. Echo 12/24 showed normal LV function; mild to moderate biatrial enlargement, mild to moderate MR, moderate TR, mild AI. Since I last saw her, she denies dyspnea, chest pain, palpitations or syncope.  She had her left knee replaced in February and is regaining strength.  Current Outpatient Medications  Medication Sig Dispense Refill   acetaminophen  (TYLENOL ) 500 MG tablet Take 1,000 mg by mouth every 6 (six) hours as needed for moderate pain or headache.     bisoprolol  (ZEBETA ) 10 MG tablet TAKE 1 TABLET BY MOUTH IN THE MORNING AND 1/2 AT BEDTIME. 135 tablet 2   conjugated estrogens (PREMARIN) vaginal cream Place 1 applicator vaginally daily as needed (prolapse flare).     diltiazem  (CARDIZEM  CD) 360 MG 24 hr capsule TAKE ONE CAPSULE BY MOUTH ONCE DAILY 90 capsule 3   Doxylamine Succinate, Sleep, (UNISOM PO) Take 1 tablet by mouth at bedtime.     ELIQUIS  5 MG TABS tablet TAKE ONE TABLET BY MOUTH TWICE DAILY 180 tablet 1   famotidine  (PEPCID ) 20 MG tablet Take 20 mg by mouth 2 (two) times daily.     furosemide  (LASIX ) 20 MG tablet Take 1 tablet (20 mg total) by mouth daily as needed for fluid or edema. 90 tablet 1   methocarbamol  (ROBAXIN ) 500 MG tablet Take 1 tablet (500 mg total) by mouth every 6 (six) hours as needed for muscle spasms. 40 tablet 1   nystatin -triamcinolone (MYCOLOG II) cream Apply topically 3 (three) times daily.     oxyCODONE  (OXY IR/ROXICODONE ) 5 MG immediate release tablet Take 1-2 tablets (5-10 mg total) by mouth every 6 (six) hours as needed for moderate pain (pain score 4-6) (pain score 4-6). 30 tablet 0   spironolactone   (ALDACTONE ) 25 MG tablet TAKE ONE TABLET BY MOUTH EVERY DAY 90 tablet 3   traMADol  (ULTRAM ) 50 MG tablet Take 1-2 tablets (50-100 mg total) by mouth every 12 (twelve) hours as needed. 30 tablet 0   triamcinolone cream (KENALOG) 0.1 % APPLY A THIN LAYER TO THE AFFECTED AREA(S) BY TOPICAL ROUTE 2 TIMES PER DAY     No current facility-administered medications for this visit.     Past Medical History:  Diagnosis Date   Allergic rhinitis    takes Benadryl  at bedtime   Allergy    Anemia    Angio-edema    Arthritis    osteoarthritis   Atrial fibrillation (HCC) 06/10/2011   takes Diltiazem  and Pradaxa  daily   Cancer (HCC)    skin   Cataract    bilateral cataracts removed   Clotting disorder (HCC)    on PRADAXA  - chronic a fib   Collagenous colitis    recurrent, takes Budesonide  daily as needed    Colon polyps 01/14/2010   Hyperplastic   Cough    states every day of her life and every chest xray is always clear   GERD (gastroesophageal reflux disease)    takes Omeprazole  daily   History of poliomyelitis    Polio  age 28- no significant neuromuscular deficit   Hyperlipidemia    was on Pravastatin  but joint pain;has been off for 2-11months ;takes CoQ10   Hypertension    takes  Bisoprolol  and Chlorthalidone  daily   Potassium deficiency    takes KDUR daily    Past Surgical History:  Procedure Laterality Date   ADENOIDECTOMY     APPENDECTOMY     at age 41   BREAST BIOPSY  1947 & 10/07   Benign   CARDIOVERSION N/A 11/05/2016   Procedure: CARDIOVERSION;  Surgeon: Jacqueline Matsu, MD;  Location: [facility];  Service: Cardiovascular;  Laterality: N/A;   cataracts removed     COLONOSCOPY     ENDOVENOUS ABLATION SAPHENOUS VEIN W/ LASER  03-03-2011 left greater saphenous vein    ENDOVENOUS ABLATION SAPHENOUS VEIN W/ LASER  01-06-2011  right greater saphenous vein   epidural infection     x 2    IR RADIOLOGIST EVAL & MGMT  03/07/2017   SKIN CANCER EXCISION     removal of skin cancer  on left leg   TONSILLECTOMY     as a child   TONSILLECTOMY     TOTAL KNEE ARTHROPLASTY Right 09/18/2013   Procedure: TOTAL KNEE ARTHROPLASTY;  Surgeon: Shirlee Dotter, MD;  Location: [facility] OR;  Service: Orthopedics;  Laterality: Right;   TOTAL KNEE ARTHROPLASTY Left 09/27/2023   Procedure: LEFT TOTAL KNEE ARTHROPLASTY;  Surgeon: Arnie Lao, MD;  Location: [facility] OR;  Service: Orthopedics;  Laterality: Left;    Social History   Socioeconomic History   Marital status: Widowed    Spouse name: Not on file   Number of children: 2   Years of education: Not on file   Highest education level: Not on file  Occupational History   Occupation: retired    Comment: helps people with food, keeping power on, etc in [place]  Tobacco Use   Smoking status: Former    Current packs/day: 0.00    Average packs/day: 1 pack/day for 18.0 years (18.0 ttl pk-yrs)    Types: Cigarettes    Start date: 08/16/1969    Quit date: 08/17/1987    Years since quitting: 36.3   Smokeless tobacco: Never   Tobacco comments:    quit smoking 1989  Vaping Use   Vaping status: Never Used  Substance and Sexual Activity   Alcohol use: Yes    Alcohol/week: 7.0 standard drinks of alcohol    Types: 7 Glasses of wine per week    Comment: wine - 1-2 glasses a day   Drug use: No   Sexual activity: Not Currently    Partners: Male    Birth control/protection: Surgical  Other Topics Concern   Not on file  Social History Narrative   Retired Film/video editor    2 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   Married     Former Smoker quit 1989    Alcohol use-yes -  glasses of wine/day      son works for Event organiser - she flies to [place] to visit her grandchildren   Social Drivers of Corporate investment banker Strain: Low Risk  (04/15/2023)   Overall Financial Resource Strain (CARDIA)    Difficulty of Paying Living Expenses: Not hard at all  Food Insecurity: No Food Insecurity (09/27/2023)   Hunger Vital Sign    Worried About Running Out of  Food in the Last Year: Never true    Ran Out of Food in the Last Year: Never true  Transportation Needs: No Transportation Needs (09/27/2023)   PRAPARE - Administrator, Civil Service (Medical): No    Lack of Transportation (Non-Medical): No  Physical Activity: Inactive (  04/15/2023)   Exercise Vital Sign    Days of Exercise per Week: 0 days    Minutes of Exercise per Session: 0 min  Stress: No Stress Concern Present (04/15/2023)   [employer] of Occupational Health - Occupational Stress Questionnaire    Feeling of Stress : Not at all  Social Connections: Moderately Integrated (09/27/2023)   Social Connection and Isolation Panel [NHANES]    Frequency of Communication with Friends and Family: More than three times a week    Frequency of Social Gatherings with Friends and Family: More than three times a week    Attends Religious Services: More than 4 times per year    Active Member of [employer] or Organizations: Yes    Attends Banker Meetings: More than 4 times per year    Marital Status: Widowed  Intimate Partner Violence: Not At Risk (09/27/2023)   Humiliation, Afraid, Rape, and Kick questionnaire    Fear of Current or Ex-Partner: No    Emotionally Abused: No    Physically Abused: No    Sexually Abused: No    Family History  Problem Relation Age of Onset   Stomach cancer Maternal Grandfather    Heart disease Paternal Grandfather    Colon cancer Son    Rectal cancer Son 27   Colon cancer Paternal Grandmother    Alcohol abuse Father    Heart disease Father    Stroke Father    Hypertension Father    Hypertension Sister    Other Sister        varicose veins   Peripheral vascular disease Mother    Other Mother        varicose veins   Esophageal cancer Neg Hx    Pancreatic cancer Neg Hx     ROS: Some knee pain but no fevers or chills, productive cough, hemoptysis, dysphasia, odynophagia, melena, hematochezia, dysuria, hematuria, rash, seizure activity,  orthopnea, PND, claudication. Remaining systems are negative.  Physical Exam: Well-developed well-nourished in no acute distress.  Skin is warm and dry.  HEENT is normal.  Neck is supple.  Chest is clear to auscultation with normal expansion.  Cardiovascular exam is irregular Abdominal exam nontender or distended. No masses palpated. Extremities show no edema. neuro grossly intact   A/P  1 permanent atrial fibrillation- continue Cardizem  and bisoprolol  for rate control.  Continue apixaban .    2 hyperlipidemia-intolerant to statins.  Continue diet.  3 hypertension-BP controlled.  Continue present medications.  4 lower extremity edema-continue diuretic at present dose.  Edema is reasonably well controlled.  5 mild to moderate mitral regurgitation/moderate tricuspid regurgitation-will plan repeat echocardiogram December 2025.  Alexandria Angel, MD

## 2023-12-02 ENCOUNTER — Other Ambulatory Visit: Payer: Medicare PPO

## 2023-12-07 ENCOUNTER — Encounter: Payer: Self-pay | Admitting: Cardiology

## 2023-12-07 ENCOUNTER — Ambulatory Visit: Payer: Medicare PPO | Attending: Cardiology | Admitting: Cardiology

## 2023-12-07 VITALS — BP 120/60 | HR 60 | Ht 65.0 in | Wt 156.4 lb

## 2023-12-07 DIAGNOSIS — I10 Essential (primary) hypertension: Secondary | ICD-10-CM | POA: Diagnosis not present

## 2023-12-07 DIAGNOSIS — I059 Rheumatic mitral valve disease, unspecified: Secondary | ICD-10-CM | POA: Diagnosis not present

## 2023-12-07 DIAGNOSIS — I4821 Permanent atrial fibrillation: Secondary | ICD-10-CM

## 2023-12-07 DIAGNOSIS — E7800 Pure hypercholesterolemia, unspecified: Secondary | ICD-10-CM

## 2023-12-07 NOTE — Patient Instructions (Signed)
   Testing/Procedures:  Your physician has requested that you have an echocardiogram. Echocardiography is a painless test that uses sound waves to create images of your heart. It provides your doctor with information about the size and shape of your heart and how well your heart's chambers and valves are working. This procedure takes approximately one hour. There are no restrictions for this procedure. Please do NOT wear cologne, perfume, aftershave, or lotions (deodorant is allowed). Please arrive 15 minutes prior to your appointment time.  Please note: We ask at that you not bring children with you during ultrasound (echo/ vascular) testing. Due to room size and safety concerns, children are not allowed in the ultrasound rooms during exams. Our front office staff cannot provide observation of children in our lobby area while testing is being conducted. An adult accompanying a patient to their appointment will only be allowed in the ultrasound room at the discretion of the ultrasound technician under special circumstances. We apologize for any inconvenience. [address]-SCHEDULE IN DECEMBER  Follow-Up: At [facility], you and your health needs are our priority.  As part of our continuing mission to provide you with exceptional heart care, our providers are all part of one team.  This team includes your primary Cardiologist (physician) and Advanced Practice Providers or APPs (Physician Assistants and Nurse Practitioners) who all work together to provide you with the care you need, when you need it.  Your next appointment:   12 month(s)  Provider:   Alexandria Angel, MD           1st Floor: - Lobby - Registration  - Pharmacy  - Lab - Cafe  2nd Floor: - PV Lab - Diagnostic Testing (echo, CT, nuclear med)  3rd Floor: - Vacant  4th Floor: - TCTS (cardiothoracic surgery) - AFib Clinic - Structural Heart Clinic - Vascular Surgery  - Vascular Ultrasound  5th Floor: -  HeartCare Cardiology (general and EP) - Clinical Pharmacy for coumadin, hypertension, lipid, weight-loss medications, and med management appointments    Valet parking services will be available as well.

## 2023-12-09 ENCOUNTER — Ambulatory Visit (INDEPENDENT_AMBULATORY_CARE_PROVIDER_SITE_OTHER): Admitting: *Deleted

## 2023-12-09 DIAGNOSIS — B351 Tinea unguium: Secondary | ICD-10-CM

## 2023-12-09 NOTE — Progress Notes (Signed)
 Patient presents today for laser maintenance treatment. Diagnosed with mycotic nail infection by Dr. Lydia Sams.   Most effected toenails left 1st, 3rd and 5th right.  All other systems are negative.  Nails were trimmed and filed thin. Laser therapy was administered to 1-5 toenails bilateral and patient tolerated the treatment well. All safety precautions were in place.   She will follow up in 3 months for laser maintenance.

## 2023-12-17 ENCOUNTER — Inpatient Hospital Stay (HOSPITAL_COMMUNITY)
Admission: EM | Admit: 2023-12-17 | Discharge: 2023-12-19 | DRG: 964 | Disposition: A | Discharge status: Home / Self Care | Attending: General Surgery | Admitting: General Surgery

## 2023-12-17 ENCOUNTER — Emergency Department (HOSPITAL_COMMUNITY)

## 2023-12-17 ENCOUNTER — Other Ambulatory Visit: Payer: Self-pay

## 2023-12-17 ENCOUNTER — Inpatient Hospital Stay (HOSPITAL_COMMUNITY)

## 2023-12-17 ENCOUNTER — Encounter (HOSPITAL_COMMUNITY): Payer: Self-pay

## 2023-12-17 DIAGNOSIS — I10 Essential (primary) hypertension: Secondary | ICD-10-CM | POA: Diagnosis not present

## 2023-12-17 DIAGNOSIS — S36020A Minor contusion of spleen, initial encounter: Secondary | ICD-10-CM | POA: Diagnosis not present

## 2023-12-17 DIAGNOSIS — S199XXA Unspecified injury of neck, initial encounter: Secondary | ICD-10-CM | POA: Diagnosis not present

## 2023-12-17 DIAGNOSIS — W19XXXA Unspecified fall, initial encounter: Principal | ICD-10-CM

## 2023-12-17 DIAGNOSIS — K219 Gastro-esophageal reflux disease without esophagitis: Secondary | ICD-10-CM | POA: Diagnosis not present

## 2023-12-17 DIAGNOSIS — S36031A Moderate laceration of spleen, initial encounter: Secondary | ICD-10-CM | POA: Diagnosis not present

## 2023-12-17 DIAGNOSIS — S271XXA Traumatic hemothorax, initial encounter: Secondary | ICD-10-CM | POA: Diagnosis present

## 2023-12-17 DIAGNOSIS — I482 Chronic atrial fibrillation, unspecified: Secondary | ICD-10-CM | POA: Diagnosis not present

## 2023-12-17 DIAGNOSIS — J942 Hemothorax: Secondary | ICD-10-CM

## 2023-12-17 DIAGNOSIS — I4821 Permanent atrial fibrillation: Secondary | ICD-10-CM

## 2023-12-17 DIAGNOSIS — J929 Pleural plaque without asbestos: Secondary | ICD-10-CM | POA: Diagnosis not present

## 2023-12-17 DIAGNOSIS — E785 Hyperlipidemia, unspecified: Secondary | ICD-10-CM | POA: Diagnosis present

## 2023-12-17 DIAGNOSIS — W1839XA Other fall on same level, initial encounter: Secondary | ICD-10-CM | POA: Diagnosis present

## 2023-12-17 DIAGNOSIS — J9811 Atelectasis: Secondary | ICD-10-CM | POA: Diagnosis not present

## 2023-12-17 DIAGNOSIS — S2241XA Multiple fractures of ribs, right side, initial encounter for closed fracture: Secondary | ICD-10-CM | POA: Diagnosis not present

## 2023-12-17 DIAGNOSIS — S0990XA Unspecified injury of head, initial encounter: Secondary | ICD-10-CM | POA: Diagnosis not present

## 2023-12-17 DIAGNOSIS — M549 Dorsalgia, unspecified: Secondary | ICD-10-CM | POA: Diagnosis not present

## 2023-12-17 DIAGNOSIS — S2231XA Fracture of one rib, right side, initial encounter for closed fracture: Secondary | ICD-10-CM | POA: Diagnosis not present

## 2023-12-17 DIAGNOSIS — Z7901 Long term (current) use of anticoagulants: Secondary | ICD-10-CM

## 2023-12-17 DIAGNOSIS — I6782 Cerebral ischemia: Secondary | ICD-10-CM | POA: Diagnosis not present

## 2023-12-17 DIAGNOSIS — Z87891 Personal history of nicotine dependence: Secondary | ICD-10-CM

## 2023-12-17 DIAGNOSIS — Y93H2 Activity, gardening and landscaping: Secondary | ICD-10-CM

## 2023-12-17 DIAGNOSIS — I700 Atherosclerosis of aorta: Secondary | ICD-10-CM | POA: Diagnosis not present

## 2023-12-17 DIAGNOSIS — S2249XA Multiple fractures of ribs, unspecified side, initial encounter for closed fracture: Secondary | ICD-10-CM | POA: Diagnosis not present

## 2023-12-17 DIAGNOSIS — Z043 Encounter for examination and observation following other accident: Secondary | ICD-10-CM | POA: Diagnosis not present

## 2023-12-17 DIAGNOSIS — R270 Ataxia, unspecified: Secondary | ICD-10-CM | POA: Diagnosis not present

## 2023-12-17 DIAGNOSIS — S36039A Unspecified laceration of spleen, initial encounter: Secondary | ICD-10-CM | POA: Diagnosis present

## 2023-12-17 DIAGNOSIS — M4312 Spondylolisthesis, cervical region: Secondary | ICD-10-CM | POA: Diagnosis not present

## 2023-12-17 DIAGNOSIS — R918 Other nonspecific abnormal finding of lung field: Secondary | ICD-10-CM | POA: Diagnosis not present

## 2023-12-17 LAB — CBC WITH DIFFERENTIAL/PLATELET
Abs Immature Granulocytes: 0.03 10*3/uL (ref 0.00–0.07)
Basophils Absolute: 0.1 10*3/uL (ref 0.0–0.1)
Basophils Relative: 1 %
Eosinophils Absolute: 0.1 10*3/uL (ref 0.0–0.5)
Eosinophils Relative: 2 %
HCT: 37.1 % (ref 36.0–46.0)
Hemoglobin: 12.6 g/dL (ref 12.0–15.0)
Immature Granulocytes: 0 %
Lymphocytes Relative: 16 %
Lymphs Abs: 1.6 10*3/uL (ref 0.7–4.0)
MCH: 34.1 pg — ABNORMAL HIGH (ref 26.0–34.0)
MCHC: 34 g/dL (ref 30.0–36.0)
MCV: 100.5 fL — ABNORMAL HIGH (ref 80.0–100.0)
Monocytes Absolute: 1.1 10*3/uL — ABNORMAL HIGH (ref 0.1–1.0)
Monocytes Relative: 11 %
Neutro Abs: 6.7 10*3/uL (ref 1.7–7.7)
Neutrophils Relative %: 70 %
Platelets: 267 10*3/uL (ref 150–400)
RBC: 3.69 MIL/uL — ABNORMAL LOW (ref 3.87–5.11)
RDW: 14.2 % (ref 11.5–15.5)
WBC: 9.5 10*3/uL (ref 4.0–10.5)
nRBC: 0 % (ref 0.0–0.2)

## 2023-12-17 LAB — TYPE AND SCREEN
ABO/RH(D): O NEG
ABO/RH(D): O NEG
Antibody Screen: NEGATIVE
Antibody Screen: NEGATIVE

## 2023-12-17 LAB — COMPREHENSIVE METABOLIC PANEL WITH GFR
ALT: 16 U/L (ref 0–44)
AST: 19 U/L (ref 15–41)
Albumin: 4.4 g/dL (ref 3.5–5.0)
Alkaline Phosphatase: 74 U/L (ref 38–126)
Anion gap: 12 (ref 5–15)
BUN: 28 mg/dL — ABNORMAL HIGH (ref 8–23)
CO2: 20 mmol/L — ABNORMAL LOW (ref 22–32)
Calcium: 9.6 mg/dL (ref 8.9–10.3)
Chloride: 103 mmol/L (ref 98–111)
Creatinine, Ser: 1.01 mg/dL — ABNORMAL HIGH (ref 0.44–1.00)
GFR, Estimated: 55 mL/min — ABNORMAL LOW (ref >60)
Glucose, Bld: 104 mg/dL — ABNORMAL HIGH (ref 70–99)
Potassium: 4.2 mmol/L (ref 3.5–5.1)
Sodium: 135 mmol/L (ref 135–145)
Total Bilirubin: 0.5 mg/dL (ref 0.0–1.2)
Total Protein: 7.2 g/dL (ref 6.5–8.1)

## 2023-12-17 LAB — PROTIME-INR
INR: 1.5 — ABNORMAL HIGH (ref 0.8–1.2)
Prothrombin Time: 18.1 s — ABNORMAL HIGH (ref 11.4–15.2)

## 2023-12-17 LAB — TROPONIN I (HIGH SENSITIVITY)
Troponin I (High Sensitivity): 3 ng/L (ref <18)
Troponin I (High Sensitivity): 3 ng/L (ref <18)

## 2023-12-17 LAB — APTT: aPTT: 43 s — ABNORMAL HIGH (ref 24–36)

## 2023-12-17 MED ORDER — ONDANSETRON 4 MG PO TBDP: 4.0000 mg | ORAL_TABLET | Freq: Four times a day (QID) | ORAL | Status: DC | PRN

## 2023-12-17 MED ORDER — IOHEXOL 300 MG/ML  SOLN
100.0000 mL | Freq: Once | INTRAMUSCULAR | Status: AC | PRN
  Administered 2023-12-17: 80 mL via INTRAVENOUS

## 2023-12-17 MED ORDER — PROTHROMBIN COMPLEX CONC HUMAN 500 UNITS IV KIT
2101.0000 [IU] | PACK | Status: AC
  Administered 2023-12-17: 2101 [IU] via INTRAVENOUS
  Filled 2023-12-17: qty 2101

## 2023-12-17 MED ORDER — FAMOTIDINE 20 MG PO TABS
20.0000 mg | ORAL_TABLET | Freq: Two times a day (BID) | ORAL | Status: DC
  Administered 2023-12-17 – 2023-12-19 (×4): 20 mg via ORAL
  Filled 2023-12-17 (×4): qty 1

## 2023-12-17 MED ORDER — ONDANSETRON HCL 4 MG/2ML IJ SOLN
4.0000 mg | Freq: Once | INTRAMUSCULAR | Status: AC
  Administered 2023-12-17: 4 mg via INTRAVENOUS
  Filled 2023-12-17: qty 2

## 2023-12-17 MED ORDER — DOXYLAMINE SUCCINATE (SLEEP) 25 MG PO TABS
25.0000 mg | ORAL_TABLET | Freq: Every day | ORAL | Status: DC
  Administered 2023-12-17 – 2023-12-18 (×2): 25 mg via ORAL
  Filled 2023-12-17 (×2): qty 1

## 2023-12-17 MED ORDER — OXYCODONE HCL 5 MG PO TABS
10.0000 mg | ORAL_TABLET | ORAL | Status: DC | PRN
  Administered 2023-12-19: 10 mg via ORAL
  Filled 2023-12-17: qty 2

## 2023-12-17 MED ORDER — ACETAMINOPHEN 500 MG PO TABS
1000.0000 mg | ORAL_TABLET | Freq: Four times a day (QID) | ORAL | Status: DC
  Administered 2023-12-17 – 2023-12-19 (×6): 1000 mg via ORAL
  Filled 2023-12-17 (×6): qty 2

## 2023-12-17 MED ORDER — OXYCODONE HCL 5 MG PO TABS
5.0000 mg | ORAL_TABLET | ORAL | Status: DC | PRN
  Administered 2023-12-18 – 2023-12-19 (×3): 5 mg via ORAL
  Filled 2023-12-17 (×3): qty 1

## 2023-12-17 MED ORDER — MORPHINE SULFATE (PF) 4 MG/ML IV SOLN
2.0000 mg | Freq: Once | INTRAVENOUS | Status: AC
  Administered 2023-12-17: 2 mg via INTRAVENOUS
  Filled 2023-12-17: qty 1

## 2023-12-17 MED ORDER — HYDRALAZINE HCL 20 MG/ML IJ SOLN: 10.0000 mg | INTRAMUSCULAR | Status: DC | PRN

## 2023-12-17 MED ORDER — POLYETHYLENE GLYCOL 3350 17 G PO PACK: 17.0000 g | PACK | Freq: Every day | ORAL | Status: DC | PRN

## 2023-12-17 MED ORDER — PROTHROMBIN COMPLEX CONC HUMAN 500 UNITS IV KIT: 50.0000 [IU]/kg | PACK | Status: DC

## 2023-12-17 MED ORDER — SPIRONOLACTONE 25 MG PO TABS
25.0000 mg | ORAL_TABLET | Freq: Every day | ORAL | Status: DC
  Administered 2023-12-18 – 2023-12-19 (×2): 25 mg via ORAL
  Filled 2023-12-17 (×2): qty 1

## 2023-12-17 MED ORDER — MAALOX MAX 400-400-40 MG/5ML PO SUSP
15.0000 mL | Freq: Four times a day (QID) | ORAL | 0 refills | Status: AC | PRN
Start: 2023-12-17 — End: ?

## 2023-12-17 MED ORDER — HYDROMORPHONE HCL 1 MG/ML IJ SOLN: 0.5000 mg | INTRAMUSCULAR | Status: DC | PRN

## 2023-12-17 MED ORDER — ONDANSETRON HCL 4 MG/2ML IJ SOLN: 4.0000 mg | Freq: Four times a day (QID) | INTRAMUSCULAR | Status: DC | PRN

## 2023-12-17 MED ORDER — METOPROLOL TARTRATE 5 MG/5ML IV SOLN: 5.0000 mg | Freq: Four times a day (QID) | INTRAVENOUS | Status: DC | PRN

## 2023-12-17 MED ORDER — METHOCARBAMOL 1000 MG/10ML IJ SOLN: 500.0000 mg | Freq: Three times a day (TID) | INTRAMUSCULAR | Status: DC

## 2023-12-17 MED ORDER — GABAPENTIN 100 MG PO CAPS
100.0000 mg | ORAL_CAPSULE | Freq: Three times a day (TID) | ORAL | Status: DC
  Administered 2023-12-17 – 2023-12-19 (×5): 100 mg via ORAL
  Filled 2023-12-17 (×5): qty 1

## 2023-12-17 MED ORDER — DILTIAZEM HCL ER COATED BEADS 180 MG PO CP24
360.0000 mg | ORAL_CAPSULE | Freq: Every day | ORAL | Status: DC
  Administered 2023-12-18 – 2023-12-19 (×2): 360 mg via ORAL
  Filled 2023-12-17 (×2): qty 2

## 2023-12-17 MED ORDER — METHOCARBAMOL 500 MG PO TABS
500.0000 mg | ORAL_TABLET | Freq: Three times a day (TID) | ORAL | Status: DC
  Administered 2023-12-17 – 2023-12-19 (×5): 500 mg via ORAL
  Filled 2023-12-17 (×5): qty 1

## 2023-12-17 MED ORDER — FENTANYL CITRATE PF 50 MCG/ML IJ SOSY
25.0000 ug | PREFILLED_SYRINGE | Freq: Once | INTRAMUSCULAR | Status: AC
  Administered 2023-12-17: 25 ug via INTRAVENOUS
  Filled 2023-12-17: qty 1

## 2023-12-17 MED ORDER — BISOPROLOL FUMARATE 10 MG PO TABS
10.0000 mg | ORAL_TABLET | Freq: Every day | ORAL | Status: DC
Start: 2023-12-17 — End: 2023-12-19
  Administered 2023-12-18: 10 mg via ORAL
  Filled 2023-12-17 (×2): qty 1

## 2023-12-17 MED ORDER — DOCUSATE SODIUM 100 MG PO CAPS
100.0000 mg | ORAL_CAPSULE | Freq: Two times a day (BID) | ORAL | Status: DC
  Administered 2023-12-17 – 2023-12-19 (×4): 100 mg via ORAL
  Filled 2023-12-17 (×4): qty 1

## 2023-12-17 NOTE — ED Notes (Signed)
 This nurse called CCMD for patient monitoring.

## 2023-12-17 NOTE — ED Provider Notes (Signed)
 [place] EMERGENCY DEPARTMENT AT [facility] Provider Note   CSN: [id number] Arrival date & time: 12/17/23  1420     History  Chief Complaint  Patient presents with   [name] [name] is a 83 y.o. female.  Patient is an 83 year old female who presents to the emergency department the chief complaint of pain to the right side of her back.  Patient notes that she was gardening when she was bent over throwing soil when she lost her balance falling backwards.  Patient notes that she did strike her head and notes that she is currently on anticoagulation.  She does admit to some mild pain in her neck.  Patient denies any associated chest pain, shortness of breath, abdominal pain.  She denies any associated preceding dizziness, lightheadedness or syncope.  She is on anticoagulation for atrial fibrillation.  She also admits to pain to her right ankle.  She denies any other long bone or joint pain at this time.   Fall       Home Medications Prior to Admission medications   Medication Sig Start Date End Date Taking? Authorizing Provider  acetaminophen  (TYLENOL ) 500 MG tablet Take 1,000 mg by mouth every 6 (six) hours as needed for moderate pain or headache.   Yes [provider]  bisoprolol  (ZEBETA ) 10 MG tablet TAKE 1 TABLET BY MOUTH IN THE MORNING AND 1/2 AT BEDTIME. 07/27/23  Yes Crenshaw, Deannie Fabian, MD  cholecalciferol (VITAMIN D3) 25 MCG (1000 UNIT) tablet Take 1,000 Units by mouth daily.   Yes [provider]  conjugated estrogens (PREMARIN) vaginal cream Place 1 applicator vaginally daily as needed (prolapse flare).   Yes [provider]  diltiazem  (CARDIZEM  CD) 360 MG 24 hr capsule TAKE ONE CAPSULE BY MOUTH ONCE DAILY 08/01/23  Yes Lenise Quince, MD  docusate sodium  (COLACE) 100 MG capsule Take 100 mg by mouth at bedtime.   Yes [provider]  Doxylamine Succinate, Sleep, (UNISOM PO) Take 1 tablet by mouth at bedtime.   Yes  [provider]  ELIQUIS  5 MG TABS tablet TAKE ONE TABLET BY MOUTH TWICE DAILY 05/31/23  Yes Crenshaw, Deannie Fabian, MD  famotidine  (PEPCID ) 20 MG tablet Take 20 mg by mouth 2 (two) times daily.   Yes [provider]  spironolactone  (ALDACTONE ) 25 MG tablet TAKE ONE TABLET BY MOUTH EVERY DAY 06/30/23  Yes Crenshaw, Deannie Fabian, MD  vitamin B-12 (CYANOCOBALAMIN ) 100 MCG tablet Take 100 mcg by mouth daily.   Yes [provider]      Allergies    Levaquin [levofloxacin in d5w], Pravastatin , Sulfa antibiotics, Augmentin [amoxicillin-pot clavulanate], Doxycycline , Erythromycin, and Polysporin [bacitracin-polymyxin b]    Review of Systems   Review of Systems  Musculoskeletal:  Positive for back pain.       Right ankle pain  All other systems reviewed and are negative.   Physical Exam Updated Vital Signs BP 129/70 (BP Location: Right Arm)   Pulse 68   Temp 97.7 F (36.5 C) (Oral)   Resp 20   Ht 5\' 5"  (1.651 m)   Wt 71 kg   SpO2 94%   BMI 26.05 kg/m  Physical Exam Vitals and nursing note reviewed.  Constitutional:      General: She is not in acute distress.    Appearance: Normal appearance. She is not ill-appearing.  HENT:     Head: Normocephalic and atraumatic.     Nose: Nose normal.  Mouth/Throat:     Mouth: Mucous membranes are moist.  Eyes:     Extraocular Movements: Extraocular movements intact.     Conjunctiva/sclera: Conjunctivae normal.     Pupils: Pupils are equal, round, and reactive to light.  Cardiovascular:     Rate and Rhythm: Normal rate.     Pulses: Normal pulses.     Heart sounds: Normal heart sounds. No murmur heard.    No gallop.     Comments: Irregularly irregular rhythm Pulmonary:     Effort: Pulmonary effort is normal. No respiratory distress.     Breath sounds: Normal breath sounds. No wheezing or rales.  Abdominal:     General: Abdomen is flat. Bowel sounds are normal. There is no distension.     Palpations: Abdomen is soft.      Tenderness: There is no abdominal tenderness. There is no guarding.  Musculoskeletal:        General: Normal range of motion.     Cervical back: Normal range of motion and neck supple. Tenderness present. No rigidity.     Comments: Tender to palpation noted over the thoracic and upper lumbar spine, tender to palpation noted over the right posterior thoracic region and right flank, mild area of swelling noted over the right posterior thoracic region, tenderness palpation noted over the right ankle diffusely, nontender palpation of remainder of long bones and joints, pelvis stable to AP and lateral compression, full range of motion noted throughout, peripheral pulses 2+ in upper and lower extremities, sensation intact distally throughout, no obvious deformity, no skin breakdown or ulceration, no lacerations or abrasions  Skin:    General: Skin is warm and dry.  Neurological:     General: No focal deficit present.     Mental Status: She is alert and oriented to person, place, and time. Mental status is at baseline.     Cranial Nerves: No cranial nerve deficit.     Sensory: No sensory deficit.     Motor: No weakness.     Coordination: Coordination normal.  Psychiatric:        Mood and Affect: Mood normal.        Behavior: Behavior normal.        Thought Content: Thought content normal.        Judgment: Judgment normal.     ED Results / Procedures / Treatments   Labs (all labs ordered are listed, but only abnormal results are displayed) Labs Reviewed  CBC WITH DIFFERENTIAL/PLATELET - Abnormal; Notable for the following components:      Result Value   RBC 3.69 (*)    MCV 100.5 (*)    MCH 34.1 (*)    Monocytes Absolute 1.1 (*)    All other components within normal limits  COMPREHENSIVE METABOLIC PANEL WITH GFR  TROPONIN I (HIGH SENSITIVITY)    EKG EKG Interpretation Date/Time:  Saturday Dec 17 2023 14:35:47 EDT Ventricular Rate:  61 PR Interval:    QRS Duration:  83 QT  Interval:  431 QTC Calculation: 435 R Axis:   6  Text Interpretation: Atrial fibrillation Confirmed by Dorenda Gandy [phone]) on 12/17/2023 2:55:11 PM  Radiology No results found.  Procedures .Critical Care  Performed by: Roselynn Connors, PA-C Authorized by: Roselynn Connors, PA-C   Critical care provider statement:    Critical care time (minutes):  35   Critical care was necessary to treat or prevent imminent or life-threatening deterioration of the following conditions:  Trauma   Critical care  was time spent personally by me on the following activities:  Development of treatment plan with patient or surrogate, discussions with consultants, evaluation of patient's response to treatment, examination of patient, ordering and review of laboratory studies, ordering and review of radiographic studies, ordering and performing treatments and interventions, pulse oximetry, re-evaluation of patient's condition and review of old charts   I assumed direction of critical care for this patient from another provider in my specialty: no     Care discussed with: admitting provider       Medications Ordered in ED Medications  morphine (PF) 4 MG/ML injection 2 mg (2 mg Intravenous Given 12/17/23 1456)  ondansetron  (ZOFRAN ) injection 4 mg (4 mg Intravenous Given 12/17/23 1456)    ED Course/ Medical Decision Making/ A&P Clinical Course as of 12/17/23 1502  Sat Dec 17, 2023  1500 EKG interpretation: Atrial fibrillation, rate of 61, normal QRS interval, normal QTc, no ST/T wave changes, no ischemic changes, no STEMI, normal axis [CR]    Clinical Course User Index [CR] Roselynn Connors, PA-C                                 Medical Decision Making Amount and/or Complexity of Data Reviewed Labs: ordered. Radiology: ordered.  Risk Prescription drug management.   This patient presents to the ED for concern of back pain, right-sided rib pain, this involves an extensive number of  treatment options, and is a complaint that carries with it a high risk of complications and morbidity.  The differential diagnosis includes rib fracture, hemothorax, intra-abdominal hemorrhage, retroperitoneal hemorrhage, vertebral fracture, intracranial hemorrhage, contusion   Co morbidities that complicate the patient evaluation  Chronic anticoagulation   Additional history obtained:  Additional history obtained from none External records from outside source obtained and reviewed including none   Lab Tests:  I Ordered, and personally interpreted labs.  The pertinent results include: No leukocytosis, no anemia, normal electrolytes, normal liver function, normal troponin, elevated creatinine   Imaging Studies ordered:  I ordered imaging studies including CT scan of head, cervical spine, chest abdomen and pelvis, right ankle x-ray I independently visualized and interpreted imaging which showed no acute intracranial hemorrhage, no vertebral fracture, right-sided rib fractures, minimal right hemothorax, grade 2 splenic laceration I agree with the radiologist interpretation   Cardiac Monitoring: / EKG:  The patient was maintained on a cardiac monitor.  I personally viewed and interpreted the cardiac monitored which showed an underlying rhythm of: Atrial fibrillation, no ST/T wave changes, no ischemic changes, no STEMI   Consultations Obtained:  I requested consultation with the trauma surgery, Dr. Davonna Estes,  and discussed lab and imaging findings as well as pertinent plan - they recommend: Transfer to [facility] for evaluation by trauma surgery   Problem List / ED Course / Critical interventions / Medication management  Patient is doing well at this time and does remain stable.  Discussed with patient that she does have a splenic laceration as well as multiple rib fractures and a mild hemothorax.  Did discuss patient case with trauma surgery who did recommend ER to ER transfer and  they will evaluate the patient at [facility].  Patient does have stable vital signs at this point.  Blood work is overall been unremarkable.  Patient has no acute changes with EKG.  Will reverse her Eliquis  per the request of trauma surgery.  Patient has voiced understanding to the plan  and has no additional questions.  Patient case was discussed with Dr. Randal Bury and Leida Puna at [facility] in the emergency department to have excepted. I ordered medication including Kcentra, morphine, Zofran  for anticoagulation with splenic laceration, pain Reevaluation of the patient after these medicines showed that the patient improved I have reviewed the patients home medicines and have made adjustments as needed   Social Determinants of Health:  None   Test / Admission - Considered:  Transfer        Final Clinical Impression(s) / ED Diagnoses Final diagnoses:  None    Rx / DC Orders ED Discharge Orders     None         Roselynn Connors, PA-C 12/17/23 1731    Guadalupe Lee, MD 12/18/23 2040

## 2023-12-17 NOTE — ED Notes (Signed)
 Patient was able to ambulate to the restroom with minimal assistance. Patient's gait was steady and did not complain of any shortness of breath or lightheadedness.

## 2023-12-17 NOTE — Progress Notes (Signed)
 Transition of Care [facility]) - CAGE-AID Screening   Patient Details  Name: [name] [name] MRN: [id number] Date of Birth: Apr 18, 1941  [facility] of [facility]) CM/SW Contact:    Brunetta Capes, RN Phone Number: 12/17/2023, 9:43 PM   Clinical Narrative:  Patient endorses use of alcohol, denies the sure of illicite substances. Resources not given at this time.  CAGE-AID Screening:    Have You Ever Felt You Ought to Cut Down on Your Drinking or Drug Use?: No Have People Annoyed You By Critizing Your Drinking Or Drug Use?: No Have You Felt Bad Or Guilty About Your Drinking Or Drug Use?: No Have You Ever Had a Drink or Used Drugs First Thing In The Morning to Steady Your Nerves or to Get Rid of a Hangover?: No CAGE-AID Score: 0  Substance Abuse Education Offered: No

## 2023-12-17 NOTE — ED Triage Notes (Signed)
 Pt was gardening and bent over to throw soil and fell over backwards somehow and hit her head as well as her rib rea. Pt wants rib xray because it hurts to take a deep breath. Pt in chronic Afib and is on a blood thinner

## 2023-12-17 NOTE — ED Notes (Signed)
 Patient placed on 2L [place], while sleeping patient oxygen saturation dropped to 88-89 percent.

## 2023-12-17 NOTE — ED Provider Notes (Signed)
 Signout from [employer].  Patient transferred from [facility].  83 year old female who fell earlier today.  Workup findings include multiple rib fractures, small hemothorax, splenic laceration.  She is on Eliquis  for atrial fibrillation.  Has been accepted by trauma Dr. Davonna Estes for admission to [facility] but wanted her to come to the emergency department for further evaluation.  She is awake and alert without significant complaints.  Pain is adequately controlled. Physical Exam  BP (!) 159/85   Pulse 78   Temp 97.7 F (36.5 C) (Oral)   Resp 15   Ht 5\' 5"  (1.651 m)   Wt 71 kg   SpO2 92%   BMI 26.05 kg/m   Physical Exam Constitutional:      Appearance: Normal appearance.  HENT:     Head: Normocephalic and atraumatic.  Cardiovascular:     Rate and Rhythm: Normal rate. Rhythm irregular.  Pulmonary:     Effort: Pulmonary effort is normal.     Breath sounds: Normal breath sounds.  Abdominal:     Tenderness: There is no abdominal tenderness. There is no guarding or rebound.  Neurological:     General: No focal deficit present.     Mental Status: She is alert.     Procedures  Procedures  ED Course / MDM   Clinical Course as of 12/18/23 1004  Sat Dec 17, 2023  1500 EKG interpretation: Atrial fibrillation, rate of 61, normal QRS interval, normal QTc, no ST/T wave changes, no ischemic changes, no STEMI, normal axis [CR]  1855 Patient did not receive reversal of her anticoagulation prior to transfer.  Reviewed with trauma team who are recommending proceeding with reversal.  Ordered Kcentra. [MB]    Clinical Course User Index [CR] Roselynn Connors, PA-C [MB] Tonya Fredrickson, MD   Medical Decision Making Amount and/or Complexity of Data Reviewed Labs: ordered. Radiology: ordered.  Risk OTC drugs. Prescription drug management. Decision regarding hospitalization.   I have ordered patient to get her Kcentra.  Awaiting trauma evaluation for admission.        Tonya Fredrickson, MD 12/18/23 1004

## 2023-12-17 NOTE — H&P (Signed)
 Admitting Physician: Avon Boers Stormi Vandevelde  Service: Trauma Surgery  CC: Fall  Subjective   Mechanism of Injury: [name] [name] is an 83 y.o. female who presented as a trauma transfer from [facility] after a fall.  Past Medical History:  Diagnosis Date   Allergic rhinitis    takes Benadryl  at bedtime   Allergy    Anemia    Angio-edema    Arthritis    osteoarthritis   Atrial fibrillation (HCC) 06/10/2011   takes Diltiazem  and Pradaxa  daily   Cancer [facility])    skin   Cataract    bilateral cataracts removed   Clotting disorder (HCC)    on PRADAXA  - chronic a fib   Collagenous colitis    recurrent, takes Budesonide  daily as needed    Colon polyps 01/14/2010   Hyperplastic   Cough    states every day of her life and every chest xray is always clear   GERD (gastroesophageal reflux disease)    takes Omeprazole  daily   History of poliomyelitis    Polio  age 42- no significant neuromuscular deficit   Hyperlipidemia    was on Pravastatin  but joint pain;has been off for 2-55months ;takes CoQ10   Hypertension    takes Bisoprolol  and Chlorthalidone  daily   Potassium deficiency    takes [facility] daily    Past Surgical History:  Procedure Laterality Date   ADENOIDECTOMY     APPENDECTOMY     at age 48   BREAST BIOPSY  1947 & 10/07   Benign   CARDIOVERSION N/A 11/05/2016   Procedure: CARDIOVERSION;  Surgeon: Jacqueline Matsu, MD;  Location: [facility] ENDOSCOPY;  Service: Cardiovascular;  Laterality: N/A;   cataracts removed     COLONOSCOPY     ENDOVENOUS ABLATION SAPHENOUS VEIN W/ LASER  03-03-2011 left greater saphenous vein    ENDOVENOUS ABLATION SAPHENOUS VEIN W/ LASER  01-06-2011  right greater saphenous vein   epidural infection     x 2    IR RADIOLOGIST EVAL & MGMT  03/07/2017   SKIN CANCER EXCISION     removal of skin cancer on left leg   TONSILLECTOMY     as a child   TONSILLECTOMY     TOTAL KNEE ARTHROPLASTY Right 09/18/2013   Procedure: TOTAL KNEE ARTHROPLASTY;  Surgeon: Shirlee Dotter, MD;  Location: [facility] OR;  Service: Orthopedics;  Laterality: Right;   TOTAL KNEE ARTHROPLASTY Left 09/27/2023   Procedure: LEFT TOTAL KNEE ARTHROPLASTY;  Surgeon: Arnie Lao, MD;  Location: [facility] OR;  Service: Orthopedics;  Laterality: Left;    Family History  Problem Relation Age of Onset   Stomach cancer Maternal Grandfather    Heart disease Paternal Grandfather    Colon cancer Son    Rectal cancer Son 67   Colon cancer Paternal Grandmother    Alcohol abuse Father    Heart disease Father    Stroke Father    Hypertension Father    Hypertension Sister    Other Sister        varicose veins   Peripheral vascular disease Mother    Other Mother        varicose veins   Esophageal cancer Neg Hx    Pancreatic cancer Neg Hx     Social:  reports that she quit smoking about 36 years ago. Her smoking use included cigarettes. She started smoking about 54 years ago. She has a 18 pack-year smoking history. She has never used smokeless tobacco. She  reports current alcohol use of about 7.0 standard drinks of alcohol per week. She reports that she does not use drugs.  Allergies:  Allergies  Allergen Reactions   Levaquin [Levofloxacin In D5w] Other (See Comments)    Severe join pain, muscle pain, and tendonitis   Pravastatin  Other (See Comments)    Muscle pain and weakness   Sulfa Antibiotics Hives   Augmentin [Amoxicillin-Pot Clavulanate] Diarrhea   Doxycycline  Rash   Erythromycin Swelling and Rash   Polysporin [Bacitracin-Polymyxin B] Other (See Comments)    Blisters     Medications: Current Outpatient Medications  Medication Instructions   acetaminophen  (TYLENOL ) 1,000 mg, Every 6 hours PRN   alum & mag hydroxide-simeth (MAALOX MAX) 400-400-40 MG/5ML suspension 15 mLs, Oral, Every 6 hours PRN   bisoprolol  (ZEBETA ) 10 MG tablet TAKE 1 TABLET BY MOUTH IN THE MORNING AND 1/2 AT BEDTIME.   cholecalciferol (VITAMIN D3) 1,000 Units, Daily   conjugated estrogens  (PREMARIN) vaginal cream 1 applicator, Daily PRN   diltiazem  (CARDIZEM  CD) 360 mg, Oral, Daily   docusate sodium  (COLACE) 100 mg, Daily at bedtime   Doxylamine Succinate, Sleep, (UNISOM PO) 1 tablet, Daily at bedtime   Eliquis  5 mg, Oral, 2 times daily   famotidine  (PEPCID ) 20 mg, 2 times daily   spironolactone  (ALDACTONE ) 25 mg, Oral, Daily   vitamin B-12 (CYANOCOBALAMIN ) 100 mcg, Daily    Objective   Primary Survey: Blood pressure (!) 159/85, pulse 78, temperature 97.7 F (36.5 C), temperature source Oral, resp. rate 15, height 5\' 5"  (1.651 m), weight 71 kg, SpO2 92%. Airway: Patent, protecting airway Breathing: Bilateral breath sounds, breathing spontaneously Circulation: Stable, Palpable peripheral pulses Disability: Moving all extremities,   GCS Eyes: 4 - Eyes open spontaneously  GCS Verbal: 5 - Oriented  GCS Motor: 6 - Obeys commands for movement  GCS 15 Environment/Exposure: Warm, dry  Secondary Survey: Head: Normocephalic, atraumatic Neck: Full range of motion without pain, no midline tenderness Chest: Bilateral breath sounds, chest wall stable, Right chest wall tenderness Abdomen: Soft, non-tender, non-distended Upper Extremities: Strength and sensation intact, palpable peripheral pulses Lower extremities: Strength and sensation intact, palpable peripheral pulses Back: No step offs or deformities, atraumatic Rectal: Deferred Psych: Normal mood and affect  Results for orders placed or performed during the hospital encounter of 12/17/23 (from the past 24 hours)  Comprehensive metabolic panel     Status: Abnormal   Collection Time: 12/17/23  2:40 PM  Result Value Ref Range   Sodium 135 135 - 145 mmol/L   Potassium 4.2 3.5 - 5.1 mmol/L   Chloride 103 98 - 111 mmol/L   CO2 20 (L) 22 - 32 mmol/L   Glucose, Bld 104 (H) 70 - 99 mg/dL   BUN 28 (H) 8 - 23 mg/dL   Creatinine, Ser 1.61 (H) 0.44 - 1.00 mg/dL   Calcium 9.6 8.9 - 09.6 mg/dL   Total Protein 7.2 6.5 - 8.1 g/dL    Albumin 4.4 3.5 - 5.0 g/dL   AST 19 15 - 41 U/L   ALT 16 0 - 44 U/L   Alkaline Phosphatase 74 38 - 126 U/L   Total Bilirubin 0.5 0.0 - 1.2 mg/dL   GFR, Estimated 55 (L) >60 mL/min   Anion gap 12 5 - 15  CBC with Differential     Status: Abnormal   Collection Time: 12/17/23  2:40 PM  Result Value Ref Range   WBC 9.5 4.0 - 10.5 K/uL   RBC 3.69 (L) 3.87 -  5.11 MIL/uL   Hemoglobin 12.6 12.0 - 15.0 g/dL   HCT 16.1 09.6 - 04.5 %   MCV 100.5 (H) 80.0 - 100.0 fL   MCH 34.1 (H) 26.0 - 34.0 pg   MCHC 34.0 30.0 - 36.0 g/dL   RDW 40.9 81.1 - 91.4 %   Platelets 267 150 - 400 K/uL   nRBC 0.0 0.0 - 0.2 %   Neutrophils Relative % 70 %   Neutro Abs 6.7 1.7 - 7.7 K/uL   Lymphocytes Relative 16 %   Lymphs Abs 1.6 0.7 - 4.0 K/uL   Monocytes Relative 11 %   Monocytes Absolute 1.1 (H) 0.1 - 1.0 K/uL   Eosinophils Relative 2 %   Eosinophils Absolute 0.1 0.0 - 0.5 K/uL   Basophils Relative 1 %   Basophils Absolute 0.1 0.0 - 0.1 K/uL   Immature Granulocytes 0 %   Abs Immature Granulocytes 0.03 0.00 - 0.07 K/uL  Troponin I (High Sensitivity)     Status: None   Collection Time: 12/17/23  2:40 PM  Result Value Ref Range   Troponin I (High Sensitivity) 3 <18 ng/L  Protime-INR     Status: Abnormal   Collection Time: 12/17/23  2:40 PM  Result Value Ref Range   Prothrombin Time 18.1 (H) 11.4 - 15.2 seconds   INR 1.5 (H) 0.8 - 1.2  APTT     Status: Abnormal   Collection Time: 12/17/23  2:40 PM  Result Value Ref Range   aPTT 43 (H) 24 - 36 seconds  Troponin I (High Sensitivity)     Status: None   Collection Time: 12/17/23  4:23 PM  Result Value Ref Range   Troponin I (High Sensitivity) 3 <18 ng/L  Type and screen [facility]     Status: None   Collection Time: 12/17/23  5:33 PM  Result Value Ref Range   ABO/RH(D) O NEG    Antibody Screen NEG    Sample Expiration      12/20/2023,2359 Performed at [facility], [address] [place], [place] [address]      Imaging Orders          CT Head Wo Contrast     1. No acute intracranial process. 2. Chronic microvascular ischemic changes. 3. Cortical irregularity involving the junction of the lamina at L3-L4, which may be chronic. Acute fracture is felt to be less likely. If there is clinical concern for acute fracture, MRI is suggested. 4. Multilevel degenerative changes in the cervical spine.       CT Cervical Spine Wo Contrast     1. No acute intracranial process. 2. Chronic microvascular ischemic changes. 3. Cortical irregularity involving the junction of the lamina at L3-L4, which may be chronic. Acute fracture is felt to be less likely. If there is clinical concern for acute fracture, MRI is suggested. 4. Multilevel degenerative changes in the cervical spine.       CT CHEST ABDOMEN PELVIS W CONTRAST     1. Displaced right 9th posterolateral rib fracture and nondisplaced right posterior 10th rib fracture. 2. Minimal right hemothorax with two tiny bubbles of air in the pleural blood. 3. Interval grade 2 splenic laceration without perisplenic hematoma. 4.  Calcific coronary artery and aortic atherosclerosis.       DG Ankle Complete Right     1. No acute fracture or dislocation. 2. Diffuse soft tissue swelling of the lower extremity and ankle.  Assessment and Plan   [name] [name] is  an 83 y.o. female who presented as a trauma transfer from [facility].  Injuries: Possible chronic change at L3-4 vs. Acute fracture - I assume the radiologist is referring to C3-4 as it was mentioned on a CT of the c-spine, will try to clarify.  Patient has no spinal tenderness on c-spine exam or lower spine so I assume any issues on imaging are chronic changes.  Will monitor. Right 9+10 rib fractures with small hemothorax - repeat CXR in the AM, pain control, pulmonary toilet Grade 2 splenic laceration - K Centra given 5/3, Eliquis  held.  Monitor abdominal exam.  Hemodynamically stable and no active extravasation noted on CT.   Monitor hemoglobin.    FEN - Regular diet VTE - Sequential Compression Devices, medical ppx on hold with splenic laceration ID - None  Dispo - Step-down unit    Junie Olds, MD  [facility] Use [email or web address] to contact on call provider  New Patient Billing: [id number] - High MDM

## 2023-12-18 LAB — BASIC METABOLIC PANEL WITH GFR
Anion gap: 7 (ref 5–15)
BUN: 22 mg/dL (ref 8–23)
CO2: 25 mmol/L (ref 22–32)
Calcium: 8.7 mg/dL — ABNORMAL LOW (ref 8.9–10.3)
Chloride: 106 mmol/L (ref 98–111)
Creatinine, Ser: 0.99 mg/dL (ref 0.44–1.00)
GFR, Estimated: 57 mL/min — ABNORMAL LOW (ref >60)
Glucose, Bld: 108 mg/dL — ABNORMAL HIGH (ref 70–99)
Potassium: 4.1 mmol/L (ref 3.5–5.1)
Sodium: 138 mmol/L (ref 135–145)

## 2023-12-18 LAB — CBC
HCT: 34 % — ABNORMAL LOW (ref 36.0–46.0)
Hemoglobin: 11 g/dL — ABNORMAL LOW (ref 12.0–15.0)
MCH: 33.3 pg (ref 26.0–34.0)
MCHC: 32.4 g/dL (ref 30.0–36.0)
MCV: 103 fL — ABNORMAL HIGH (ref 80.0–100.0)
Platelets: 218 10*3/uL (ref 150–400)
RBC: 3.3 MIL/uL — ABNORMAL LOW (ref 3.87–5.11)
RDW: 14.1 % (ref 11.5–15.5)
WBC: 8.1 10*3/uL (ref 4.0–10.5)
nRBC: 0 % (ref 0.0–0.2)

## 2023-12-18 MED ORDER — ALUM & MAG HYDROXIDE-SIMETH 200-200-20 MG/5ML PO SUSP
15.0000 mL | Freq: Four times a day (QID) | ORAL | Status: DC | PRN
  Administered 2023-12-18: 15 mL via ORAL
  Filled 2023-12-18: qty 30

## 2023-12-18 NOTE — ED Notes (Signed)
Pt ambulated to bathroom with standby assistance.

## 2023-12-18 NOTE — ED Notes (Signed)
Pt is on room air

## 2023-12-18 NOTE — ED Notes (Signed)
Inpatient Provider at bedside.

## 2023-12-18 NOTE — ED Notes (Signed)
Patient ambulated to the restroom independently  

## 2023-12-18 NOTE — Evaluation (Signed)
 Physical Therapy Evaluation Patient Details Name: [name] [name] MRN: [id number] DOB: [birth year] Today's Date: 12/18/2023  History of Present Illness  83 y.o. female presents to [facility] from [facility] on 12/17/23 after falling. Pt with possible chronic change at C3-C4 vs. Acute fx, R 9 and 10 rib fx with small HTX, grade 2 splenic laceration. PMHx: OA, a-fib, poliomyelitis, HTN, B TKA  Clinical Impression  Pt in bed upon arrival and agreeable to PT eval. PTA, pt was ModI with a walking stick or RW. In today's session, pt had increased difficulty with bed mobility due to pain in R ribs. She was able to roll with MinA with ModA needed to raise trunk. Multiple attempts due to pain with pt holding pillow on R side. Once in standing, pt was able to ambulate 246ft with supervision and RW. Recommending further evaluation of balance in future sessions as pt had a prior fall and reported having unsteadiness prior to admit. She will have 24/7 physical assist available from son upon d/c home. Recommending OP PT to work on balance and returning to PLOF.  Pt currently with functional limitations due to the deficits listed below (see PT Problem List). Pt would benefit from acute skilled PT to address functional impairments. Acute PT to follow.    BP 116/57, 71 BPM SpO2 >94% on RA      If plan is discharge home, recommend the following: A little help with walking and/or transfers;A little help with bathing/dressing/bathroom;Assistance with cooking/housework;Assist for transportation;Help with stairs or ramp for entrance   Can travel by private vehicle   Yes     Equipment Recommendations Rolling walker (2 wheels)     Functional Status Assessment Patient has had a recent decline in their functional status and demonstrates the ability to make significant improvements in function in a reasonable and predictable amount of time.     Precautions / Restrictions Precautions Precautions: Fall Precaution/Restrictions Comments: R  9-10 rib fxs, chronic C3/C4 changes Restrictions Weight Bearing Restrictions Per Provider Order: No      Mobility  Bed Mobility Overal bed mobility: Needs Assistance Bed Mobility: Rolling, Sidelying to Sit, Sit to Sidelying Rolling: Min assist Sidelying to sit: Mod assist    Sit to sidelying: Min assist General bed mobility comments: MinA to roll with pt holding pillow on R ribs and support at shoulders/hips. ModA to assist with raising trunk. MinA for slight LE management upon return to supine    Transfers Overall transfer level: Needs assistance Equipment used: Rolling walker (2 wheels) Transfers: Sit to/from Stand Sit to Stand: Supervision      General transfer comment: supervision for safety    Ambulation/Gait Ambulation/Gait assistance: Supervision Gait Distance (Feet): 200 Feet Assistive device: Rolling walker (2 wheels) Gait Pattern/deviations: WFL(Within Functional Limits) Gait velocity: decr     General Gait Details: steady gait with use of RW    Balance Overall balance assessment: Needs assistance, Mild deficits observed, not formally tested, History of Falls Sitting-balance support: No upper extremity supported, Feet supported Sitting balance-Leahy Scale: Good     Standing balance support: No upper extremity supported Standing balance-Leahy Scale: Fair Standing balance comment: able to stand statically with no UE support, RW for gait          Pertinent Vitals/Pain Pain Assessment Pain Assessment: Faces Faces Pain Scale: Hurts even more Pain Location: R ribs with movement Pain Descriptors / Indicators: Discomfort, Guarding, Crying Pain Intervention(s): Limited activity within patient's tolerance, Monitored during session, Repositioned    Home Living  Family/patient expects to be discharged to:: Private residence Living Arrangements: Alone Available Help at Discharge: Family;Available 24 hours/day Type of Home: House Home Access: Stairs to  enter Entrance Stairs-Rails: Right Entrance Stairs-Number of Steps: 2 Alternate Level Stairs-Number of Steps: 12 Home Layout: Two level;Able to live on main level with bedroom/bathroom Home Equipment: Rollator (4 wheels);Shower seat;BSC/3in1 (walking stick)      Prior Function Prior Level of Function : Independent/Modified Independent;Driving;History of Falls (last six months)      Mobility Comments: Marvell Slider after turning while working in the yard outside. ModI with RW or hiking stick ADLs Comments: Ind     Extremity/Trunk Assessment   Upper Extremity Assessment Upper Extremity Assessment: Defer to OT evaluation    Lower Extremity Assessment Lower Extremity Assessment: Overall WFL for tasks assessed    Cervical / Trunk Assessment Cervical / Trunk Assessment: Normal  Communication   Communication Communication: No apparent difficulties    Cognition Arousal: Alert Behavior During Therapy: WFL for tasks assessed/performed   PT - Cognitive impairments: No apparent impairments    Following commands: Intact       Cueing Cueing Techniques: Verbal cues, Visual cues, Tactile cues      PT Assessment Patient needs continued PT services  PT Problem List Decreased activity tolerance;Decreased balance;Decreased mobility       PT Treatment Interventions DME instruction;Gait training;Functional mobility training;Stair training;Therapeutic activities;Therapeutic exercise;Balance training;Neuromuscular re-education;Patient/family education    PT Goals (Current goals can be found in the Care Plan section)  Acute Rehab PT Goals Patient Stated Goal: to work on balance and prevent future falls PT Goal Formulation: With patient Time For Goal Achievement: 01/01/24 Potential to Achieve Goals: Good    Frequency Min 2X/week        AM-PAC PT "6 Clicks" Mobility  Outcome Measure Help needed turning from your back to your side while in a flat bed without using bedrails?: A  Little Help needed moving from lying on your back to sitting on the side of a flat bed without using bedrails?: A Lot Help needed moving to and from a bed to a chair (including a wheelchair)?: A Little Help needed standing up from a chair using your arms (e.g., wheelchair or bedside chair)?: A Little Help needed to walk in hospital room?: A Little Help needed climbing 3-5 steps with a railing? : A Little 6 Click Score: 17    End of Session Equipment Utilized During Treatment: Gait belt Activity Tolerance: Patient tolerated treatment well Patient left: in bed;with call bell/phone within reach Nurse Communication: Mobility status;Other (comment) (left on RA, O2 sats) PT Visit Diagnosis: Unsteadiness on feet (R26.81);History of falling (Z91.81)    Time: 9528-4132 PT Time Calculation (min) (ACUTE ONLY): 30 min   Charges:   PT Evaluation $PT Eval Low Complexity: 1 Low PT Treatments $Gait Training: 8-22 mins PT General Charges $$ ACUTE PT VISIT: 1 Visit       Orysia Blas, PT, DPT Secure Chat Preferred  Rehab Office [phone]   Alissa April Adela Ades 12/18/2023, 10:26 AM

## 2023-12-18 NOTE — Progress Notes (Signed)
 Subjective/Chief Complaint: Worst pain is fro reflux this morning.    Objective: Vital signs in last 24 hours: Temp:  [97.7 F (36.5 C)-98.7 F (37.1 C)] 97.7 F (36.5 C) (05/04 0419) Pulse Rate:  [57-87] 67 (05/04 0426) Resp:  [11-26] 18 (05/04 0426) BP: (92-159)/(52-100) 118/74 (05/04 0424) SpO2:  [89 %-99 %] 98 % (05/04 0426) Weight:  [71 kg] 71 kg (05/03 1433) Last BM Date : 12/17/23  Intake/Output from previous day: No intake/output data recorded. Intake/Output this shift: No intake/output data recorded.  Alert, well appearing Unlabored respirations Abd s/nt/nd  Lab Results:  Recent Labs    12/17/23 1440 12/18/23 0408  WBC 9.5 8.1  HGB 12.6 11.0*  HCT 37.1 34.0*  PLT 267 218   BMET Recent Labs    12/17/23 1440 12/18/23 0408  NA 135 138  K 4.2 4.1  CL 103 106  CO2 20* 25  GLUCOSE 104* 108*  BUN 28* 22  CREATININE 1.01* 0.99  CALCIUM 9.6 8.7*   PT/INR Recent Labs    12/17/23 1440  LABPROT 18.1*  INR 1.5*   ABG No results for input(s): "PHART", "HCO3" in the last 72 hours.  Invalid input(s): "PCO2", "PO2"  Studies/Results: DG Chest Port 1 View Result Date: 12/17/2023 CLINICAL DATA:  Recent fall, follow-up right hemothorax EXAM: PORTABLE CHEST 1 VIEW COMPARISON:  CT from earlier in the same day. FINDINGS: Cardiac shadow is within normal limits. Lungs are clear bilaterally. No pneumothorax is seen. The known pleural thickening is not well appreciated on this exam. Known rib fractures are also not as well appreciated on this study. IMPRESSION: No acute abnormality noted. Electronically Signed   By: Violeta Grey M.D.   On: 12/17/2023 21:37   CT Head Wo Contrast Result Date: 12/17/2023 CLINICAL DATA:  Ataxia, cervical trauma.  Fall. EXAM: CT HEAD WITHOUT CONTRAST CT CERVICAL SPINE WITHOUT CONTRAST TECHNIQUE: Multidetector CT imaging of the head and cervical spine was performed following the standard protocol without intravenous contrast. Multiplanar  CT image reconstructions of the cervical spine were also generated. RADIATION DOSE REDUCTION: This exam was performed according to the departmental dose-optimization program which includes automated exposure control, adjustment of the mA and/or kV according to patient size and/or use of iterative reconstruction technique. COMPARISON:  07/10/2013. FINDINGS: CT HEAD FINDINGS Brain: No acute intracranial hemorrhage, midline shift or mass effect. No extra-axial fluid collection is seen. Periventricular white matter hypodensities are present bilaterally. No hydrocephalus. Vascular: No hyperdense vessel or unexpected calcification. Skull: Normal. Negative for fracture or focal lesion. Sinuses/Orbits: No acute finding. Other: None. CT CERVICAL SPINE FINDINGS Alignment: There is mild anterolisthesis at C5-C6 and C6-C7. Skull base and vertebrae: No acute fracture. No primary bone lesion or focal pathologic process. Soft tissues and spinal canal: No prevertebral fluid or swelling. No visible canal hematoma. Disc levels: Multilevel intervertebral disc space narrowing, degenerative endplate changes, and facet arthropathy. Upper chest: No acute abnormality. Other: Carotid artery calcification. IMPRESSION: 1. No acute intracranial process. 2. Chronic microvascular ischemic changes. 3. Cortical irregularity involving the junction of the lamina at L3-L4, which may be chronic. Acute fracture is felt to be less likely. If there is clinical concern for acute fracture, MRI is suggested. 4. Multilevel degenerative changes in the cervical spine. Electronically Signed   By: Wyvonnia Heimlich M.D.   On: 12/17/2023 16:47   CT Cervical Spine Wo Contrast Result Date: 12/17/2023 CLINICAL DATA:  Ataxia, cervical trauma.  Fall. EXAM: CT HEAD WITHOUT CONTRAST CT CERVICAL SPINE WITHOUT CONTRAST  TECHNIQUE: Multidetector CT imaging of the head and cervical spine was performed following the standard protocol without intravenous contrast. Multiplanar CT  image reconstructions of the cervical spine were also generated. RADIATION DOSE REDUCTION: This exam was performed according to the departmental dose-optimization program which includes automated exposure control, adjustment of the mA and/or kV according to patient size and/or use of iterative reconstruction technique. COMPARISON:  07/10/2013. FINDINGS: CT HEAD FINDINGS Brain: No acute intracranial hemorrhage, midline shift or mass effect. No extra-axial fluid collection is seen. Periventricular white matter hypodensities are present bilaterally. No hydrocephalus. Vascular: No hyperdense vessel or unexpected calcification. Skull: Normal. Negative for fracture or focal lesion. Sinuses/Orbits: No acute finding. Other: None. CT CERVICAL SPINE FINDINGS Alignment: There is mild anterolisthesis at C5-C6 and C6-C7. Skull base and vertebrae: No acute fracture. No primary bone lesion or focal pathologic process. Soft tissues and spinal canal: No prevertebral fluid or swelling. No visible canal hematoma. Disc levels: Multilevel intervertebral disc space narrowing, degenerative endplate changes, and facet arthropathy. Upper chest: No acute abnormality. Other: Carotid artery calcification. IMPRESSION: 1. No acute intracranial process. 2. Chronic microvascular ischemic changes. 3. Cortical irregularity involving the junction of the lamina at L3-L4, which may be chronic. Acute fracture is felt to be less likely. If there is clinical concern for acute fracture, MRI is suggested. 4. Multilevel degenerative changes in the cervical spine. Electronically Signed   By: Wyvonnia Heimlich M.D.   On: 12/17/2023 16:47   CT CHEST ABDOMEN PELVIS W CONTRAST Result Date: 12/17/2023 CLINICAL DATA:  Blunt poly trauma from a fall. Flank and thoracic back pain. EXAM: CT CHEST, ABDOMEN, AND PELVIS WITH CONTRAST TECHNIQUE: Multidetector CT imaging of the chest, abdomen and pelvis was performed following the standard protocol during bolus administration  of intravenous contrast. RADIATION DOSE REDUCTION: This exam was performed according to the departmental dose-optimization program which includes automated exposure control, adjustment of the mA and/or kV according to patient size and/or use of iterative reconstruction technique. CONTRAST:  80mL OMNIPAQUE IOHEXOL 300 MG/ML  SOLN COMPARISON:  Abdomen and pelvis CT dated 07/24/2017. Chest radiographs dated 06/15/2017. FINDINGS: CT CHEST FINDINGS Cardiovascular: Atheromatous calcifications, including the coronary arteries and aorta. Borderline enlarged heart. No pericardial effusion. Mediastinum/Nodes: No enlarged mediastinal, hilar, or axillary lymph nodes. Thyroid  gland, trachea, and esophagus demonstrate no significant findings. Lungs/Pleura: Minimal bilateral dependent atelectasis. Minimal medium density pleural fluid on the right, compatible with a minimal hemothorax. Two tiny bubbles of air in the pleural blood. Musculoskeletal: Displaced 9th posterolateral rib fracture and nondisplaced right posterior 10th rib fracture. Mild amount of adjacent soft tissue air and minimal adjacent pleural air. Moderate to marked lower thoracic and lumbar spine degenerative changes without significant change stable proximally 20% old T10 superior endplate compression deformity without bony retropulsion. No acute spine fractures or subluxations. CT ABDOMEN PELVIS FINDINGS Hepatobiliary: No focal liver abnormality is seen. No gallstones, gallbladder wall thickening, or biliary dilatation. Pancreas: Unremarkable. No pancreatic ductal dilatation or surrounding inflammatory changes. Spleen: Interval splenic laceration without perisplenic hematoma. Adrenals/Urinary Tract: Adrenal glands are unremarkable. Kidneys are normal, without renal calculi, focal lesion, or hydronephrosis. Bladder is unremarkable. Stomach/Bowel: Unremarkable stomach, small bowel and colon. Surgically absent appendix. Vascular/Lymphatic: Atheromatous arterial  calcifications without aneurysm. No enlarged lymph nodes. Reproductive: Uterus and bilateral adnexa are unremarkable. Other: No abdominal wall hernia or abnormality. No abdominopelvic ascites. Musculoskeletal: Moderate thoracolumbar scoliosis extensive degenerative changes. No fractures, dislocations or subluxations. Minimal right hip degenerative changes. IMPRESSION: 1. Displaced right 9th posterolateral rib fracture and nondisplaced  right posterior 10th rib fracture. 2. Minimal right hemothorax with two tiny bubbles of air in the pleural blood. 3. Interval grade 2 splenic laceration without perisplenic hematoma. 4.  Calcific coronary artery and aortic atherosclerosis. Aortic Atherosclerosis (ICD10-I70.0). Critical Value/emergent results were called by telephone at the time of interpretation on 12/17/2023 at 4:44 pm to provider CHRISTOPHER RIGNEY , who verbally acknowledged these results. Electronically Signed   By: Catherin Closs M.D.   On: 12/17/2023 16:47   DG Ankle Complete Right Result Date: 12/17/2023 CLINICAL DATA:  Fall EXAM: RIGHT ANKLE - COMPLETE 3+ VIEW COMPARISON:  None Available. FINDINGS: There is no evidence of fracture, dislocation, or joint effusion. There is no evidence of arthropathy or other focal bone abnormality. There is diffuse soft tissue swelling of the lower extremity and ankle. IMPRESSION: 1. No acute fracture or dislocation. 2. Diffuse soft tissue swelling of the lower extremity and ankle. Electronically Signed   By: Tyron Gallon M.D.   On: 12/17/2023 15:46    Anti-infectives: Anti-infectives (From admission, onward)    None       Assessment/Plan: [name] [name] is an 83 y.o. female who presented as a trauma transfer from [facility].   Injuries: Possible chronic change at C3-4 vs. Acute fracture - no pain or tenderness; suspect chronic changes.  Will monitor. Right 9+10 rib fractures with small hemothorax - f/u CXR last night without acute change; repeat CXR in the AM, pain  control, pulmonary toilet Grade 2 splenic laceration - K Centra given 5/3, Eliquis  held.  Monitor abdominal exam.  Hemodynamically stable and no active extravasation noted on CT.  Monitor hemoglobin- 11 this AM from 12.6 yesterday.      FEN - Regular diet VTE - Sequential Compression Devices, medical ppx on hold with splenic laceration ID - None   Dispo - Step-down unit (awaiting bed- still in ER as of this note)   LOS: 1 day    Adalberto Acton 12/18/2023

## 2023-12-18 NOTE — ED Notes (Signed)
Pt ambulated to the bathroom independently.

## 2023-12-18 NOTE — ED Notes (Signed)
 Pt ambulating in hallway with her son at her side

## 2023-12-19 ENCOUNTER — Inpatient Hospital Stay (HOSPITAL_COMMUNITY)

## 2023-12-19 LAB — BASIC METABOLIC PANEL WITH GFR
Anion gap: 7 (ref 5–15)
BUN: 19 mg/dL (ref 8–23)
CO2: 25 mmol/L (ref 22–32)
Calcium: 9.1 mg/dL (ref 8.9–10.3)
Chloride: 104 mmol/L (ref 98–111)
Creatinine, Ser: 1.19 mg/dL — ABNORMAL HIGH (ref 0.44–1.00)
GFR, Estimated: 45 mL/min — ABNORMAL LOW (ref >60)
Glucose, Bld: 108 mg/dL — ABNORMAL HIGH (ref 70–99)
Potassium: 5.1 mmol/L (ref 3.5–5.1)
Sodium: 136 mmol/L (ref 135–145)

## 2023-12-19 LAB — MAGNESIUM: Magnesium: 1.9 mg/dL (ref 1.7–2.4)

## 2023-12-19 LAB — CBC
HCT: 35.1 % — ABNORMAL LOW (ref 36.0–46.0)
Hemoglobin: 11.4 g/dL — ABNORMAL LOW (ref 12.0–15.0)
MCH: 34.2 pg — ABNORMAL HIGH (ref 26.0–34.0)
MCHC: 32.5 g/dL (ref 30.0–36.0)
MCV: 105.4 fL — ABNORMAL HIGH (ref 80.0–100.0)
Platelets: 215 10*3/uL (ref 150–400)
RBC: 3.33 MIL/uL — ABNORMAL LOW (ref 3.87–5.11)
RDW: 14 % (ref 11.5–15.5)
WBC: 7.7 10*3/uL (ref 4.0–10.5)
nRBC: 0 % (ref 0.0–0.2)

## 2023-12-19 MED ORDER — METHOCARBAMOL 500 MG PO TABS: 500.0000 mg | ORAL_TABLET | Freq: Three times a day (TID) | ORAL | 0 refills | Status: DC | PRN

## 2023-12-19 MED ORDER — LACTATED RINGERS IV BOLUS
500.0000 mL | Freq: Once | INTRAVENOUS | Status: AC
  Administered 2023-12-19: 500 mL via INTRAVENOUS

## 2023-12-19 MED ORDER — APIXABAN 5 MG PO TABS: 5.0000 mg | ORAL_TABLET | Freq: Two times a day (BID) | ORAL | 1 refills | Status: DC

## 2023-12-19 MED ORDER — POLYETHYLENE GLYCOL 3350 17 G PO PACK: 17.0000 g | PACK | Freq: Every day | ORAL | Status: AC | PRN

## 2023-12-19 MED ORDER — POLYETHYLENE GLYCOL 3350 17 G PO PACK
17.0000 g | PACK | Freq: Every day | ORAL | Status: DC
  Administered 2023-12-19: 17 g via ORAL
  Filled 2023-12-19: qty 1

## 2023-12-19 MED ORDER — OXYCODONE HCL 5 MG PO TABS: 5.0000 mg | ORAL_TABLET | ORAL | 0 refills | Status: DC | PRN

## 2023-12-19 NOTE — Discharge Summary (Signed)
 [facility] Discharge Summary   Patient ID: [name] [name] MRN: [id number] DOB/AGE: 1941/05/20 83 y.o.  Admit date: 12/17/2023 Discharge date: 12/19/2023  Admitting Diagnosis: Fall  Rib fractures    Discharge Diagnosis Patient Active Problem List   Diagnosis Date Noted   Splenic laceration 12/17/2023   Constipation 10/05/2023   Status post total left knee replacement 09/27/2023   Anemia 01/28/2022   Osteopenia 01/28/2022   Allergy to alpha-gal 01/28/2022   Sinus complaint 01/28/2022   B12 deficiency 01/28/2022   Vitamin D  deficiency 01/28/2022   Aortic atherosclerosis (HCC) 11/19/2020   Bladder prolapse, female, acquired 11/12/2019   Chronic sinusitis 12/06/2018   Hyperglycemia 01/03/2017   DDD (degenerative disc disease), lumbar 01/03/2017   Chronic back pain 01/02/2017   Bilateral leg edema 04/13/2016   Fatigue 05/29/2015   Osteoarthritis of right knee 09/21/2013   S/P total knee replacement using cement 09/18/2013   Anisocoria 07/26/2013   Unilateral primary osteoarthritis, left knee 06/09/2012   Varicose veins of bilateral lower extremities with other complications 09/02/2011   Permanent atrial fibrillation (HCC) 06/29/2011   COUGH, CHRONIC 03/09/2010   HIP PAIN, BILATERAL 07/17/2009   ALLERGIC RHINITIS 04/23/2008   GERD 11/01/2007   Essential hypertension 09/20/2007   SKIN CANCER, LEG 07/03/2007   Hyperlipidemia 07/03/2007   Colitis, collagenous 07/03/2007    Consultants None   Imaging: DG CHEST PORT 1 VIEW Result Date: 12/19/2023 CLINICAL DATA:  [id number].  Rib fractures. EXAM: PORTABLE CHEST 1 VIEW COMPARISON:  Portable 12/17/2023 FINDINGS: 7:09 a.m. There is a small right pleural effusion or hemothorax, slightly increased. Increased linear atelectasis is noted in the right base with remaining lungs generally clear. The cardiomediastinal silhouette vasculature are normal apart calcification in the transverse aorta. Slightly displaced fracture of  posterolateral right eighth rib. Other rib fractures described in the CT report are not well seen. There is no measurable pneumothorax. IMPRESSION: 1. Small right pleural effusion or hemothorax, slightly increased. 2. Increased linear atelectasis in the right base. 3. Slightly displaced fracture of posterolateral right eighth rib. 4. Other rib fractures described in the CT report are not well seen. Electronically Signed   By: Denman Fischer M.D.   On: 12/19/2023 07:26   DG Chest Port 1 View Result Date: 12/17/2023 CLINICAL DATA:  Recent fall, follow-up right hemothorax EXAM: PORTABLE CHEST 1 VIEW COMPARISON:  CT from earlier in the same day. FINDINGS: Cardiac shadow is within normal limits. Lungs are clear bilaterally. No pneumothorax is seen. The known pleural thickening is not well appreciated on this exam. Known rib fractures are also not as well appreciated on this study. IMPRESSION: No acute abnormality noted. Electronically Signed   By: Violeta Grey M.D.   On: 12/17/2023 21:37   CT Head Wo Contrast Result Date: 12/17/2023 CLINICAL DATA:  Ataxia, cervical trauma.  Fall. EXAM: CT HEAD WITHOUT CONTRAST CT CERVICAL SPINE WITHOUT CONTRAST TECHNIQUE: Multidetector CT imaging of the head and cervical spine was performed following the standard protocol without intravenous contrast. Multiplanar CT image reconstructions of the cervical spine were also generated. RADIATION DOSE REDUCTION: This exam was performed according to the departmental dose-optimization program which includes automated exposure control, adjustment of the mA and/or kV according to patient size and/or use of iterative reconstruction technique. COMPARISON:  07/10/2013. FINDINGS: CT HEAD FINDINGS Brain: No acute intracranial hemorrhage, midline shift or mass effect. No extra-axial fluid collection is seen. Periventricular white matter hypodensities are present bilaterally. No hydrocephalus. Vascular: No hyperdense vessel or unexpected  calcification. Skull:  Normal. Negative for fracture or focal lesion. Sinuses/Orbits: No acute finding. Other: None. CT CERVICAL SPINE FINDINGS Alignment: There is mild anterolisthesis at C5-C6 and C6-C7. Skull base and vertebrae: No acute fracture. No primary bone lesion or focal pathologic process. Soft tissues and spinal canal: No prevertebral fluid or swelling. No visible canal hematoma. Disc levels: Multilevel intervertebral disc space narrowing, degenerative endplate changes, and facet arthropathy. Upper chest: No acute abnormality. Other: Carotid artery calcification. IMPRESSION: 1. No acute intracranial process. 2. Chronic microvascular ischemic changes. 3. Cortical irregularity involving the junction of the lamina at L3-L4, which may be chronic. Acute fracture is felt to be less likely. If there is clinical concern for acute fracture, MRI is suggested. 4. Multilevel degenerative changes in the cervical spine. Electronically Signed   By: Wyvonnia Heimlich M.D.   On: 12/17/2023 16:47   CT Cervical Spine Wo Contrast Result Date: 12/17/2023 CLINICAL DATA:  Ataxia, cervical trauma.  Fall. EXAM: CT HEAD WITHOUT CONTRAST CT CERVICAL SPINE WITHOUT CONTRAST TECHNIQUE: Multidetector CT imaging of the head and cervical spine was performed following the standard protocol without intravenous contrast. Multiplanar CT image reconstructions of the cervical spine were also generated. RADIATION DOSE REDUCTION: This exam was performed according to the departmental dose-optimization program which includes automated exposure control, adjustment of the mA and/or kV according to patient size and/or use of iterative reconstruction technique. COMPARISON:  07/10/2013. FINDINGS: CT HEAD FINDINGS Brain: No acute intracranial hemorrhage, midline shift or mass effect. No extra-axial fluid collection is seen. Periventricular white matter hypodensities are present bilaterally. No hydrocephalus. Vascular: No hyperdense vessel or unexpected  calcification. Skull: Normal. Negative for fracture or focal lesion. Sinuses/Orbits: No acute finding. Other: None. CT CERVICAL SPINE FINDINGS Alignment: There is mild anterolisthesis at C5-C6 and C6-C7. Skull base and vertebrae: No acute fracture. No primary bone lesion or focal pathologic process. Soft tissues and spinal canal: No prevertebral fluid or swelling. No visible canal hematoma. Disc levels: Multilevel intervertebral disc space narrowing, degenerative endplate changes, and facet arthropathy. Upper chest: No acute abnormality. Other: Carotid artery calcification. IMPRESSION: 1. No acute intracranial process. 2. Chronic microvascular ischemic changes. 3. Cortical irregularity involving the junction of the lamina at L3-L4, which may be chronic. Acute fracture is felt to be less likely. If there is clinical concern for acute fracture, MRI is suggested. 4. Multilevel degenerative changes in the cervical spine. Electronically Signed   By: Wyvonnia Heimlich M.D.   On: 12/17/2023 16:47   CT CHEST ABDOMEN PELVIS W CONTRAST Result Date: 12/17/2023 CLINICAL DATA:  Blunt poly trauma from a fall. Flank and thoracic back pain. EXAM: CT CHEST, ABDOMEN, AND PELVIS WITH CONTRAST TECHNIQUE: Multidetector CT imaging of the chest, abdomen and pelvis was performed following the standard protocol during bolus administration of intravenous contrast. RADIATION DOSE REDUCTION: This exam was performed according to the departmental dose-optimization program which includes automated exposure control, adjustment of the mA and/or kV according to patient size and/or use of iterative reconstruction technique. CONTRAST:  80mL OMNIPAQUE IOHEXOL 300 MG/ML  SOLN COMPARISON:  Abdomen and pelvis CT dated 07/24/2017. Chest radiographs dated 06/15/2017. FINDINGS: CT CHEST FINDINGS Cardiovascular: Atheromatous calcifications, including the coronary arteries and aorta. Borderline enlarged heart. No pericardial effusion. Mediastinum/Nodes: No  enlarged mediastinal, hilar, or axillary lymph nodes. Thyroid  gland, trachea, and esophagus demonstrate no significant findings. Lungs/Pleura: Minimal bilateral dependent atelectasis. Minimal medium density pleural fluid on the right, compatible with a minimal hemothorax. Two tiny bubbles of air in the pleural blood. Musculoskeletal: Displaced 9th posterolateral  rib fracture and nondisplaced right posterior 10th rib fracture. Mild amount of adjacent soft tissue air and minimal adjacent pleural air. Moderate to marked lower thoracic and lumbar spine degenerative changes without significant change stable proximally 20% old T10 superior endplate compression deformity without bony retropulsion. No acute spine fractures or subluxations. CT ABDOMEN PELVIS FINDINGS Hepatobiliary: No focal liver abnormality is seen. No gallstones, gallbladder wall thickening, or biliary dilatation. Pancreas: Unremarkable. No pancreatic ductal dilatation or surrounding inflammatory changes. Spleen: Interval splenic laceration without perisplenic hematoma. Adrenals/Urinary Tract: Adrenal glands are unremarkable. Kidneys are normal, without renal calculi, focal lesion, or hydronephrosis. Bladder is unremarkable. Stomach/Bowel: Unremarkable stomach, small bowel and colon. Surgically absent appendix. Vascular/Lymphatic: Atheromatous arterial calcifications without aneurysm. No enlarged lymph nodes. Reproductive: Uterus and bilateral adnexa are unremarkable. Other: No abdominal wall hernia or abnormality. No abdominopelvic ascites. Musculoskeletal: Moderate thoracolumbar scoliosis extensive degenerative changes. No fractures, dislocations or subluxations. Minimal right hip degenerative changes. IMPRESSION: 1. Displaced right 9th posterolateral rib fracture and nondisplaced right posterior 10th rib fracture. 2. Minimal right hemothorax with two tiny bubbles of air in the pleural blood. 3. Interval grade 2 splenic laceration without perisplenic  hematoma. 4.  Calcific coronary artery and aortic atherosclerosis. Aortic Atherosclerosis (ICD10-I70.0). Critical Value/emergent results were called by telephone at the time of interpretation on 12/17/2023 at 4:44 pm to provider CHRISTOPHER RIGNEY , who verbally acknowledged these results. Electronically Signed   By: Catherin Closs M.D.   On: 12/17/2023 16:47   DG Ankle Complete Right Result Date: 12/17/2023 CLINICAL DATA:  Fall EXAM: RIGHT ANKLE - COMPLETE 3+ VIEW COMPARISON:  None Available. FINDINGS: There is no evidence of fracture, dislocation, or joint effusion. There is no evidence of arthropathy or other focal bone abnormality. There is diffuse soft tissue swelling of the lower extremity and ankle. IMPRESSION: 1. No acute fracture or dislocation. 2. Diffuse soft tissue swelling of the lower extremity and ankle. Electronically Signed   By: Tyron Gallon M.D.   On: 12/17/2023 15:46    Procedures None   Hospital Course:  83 y.o. female who presented as a trauma transfer from [facility] after a fall.   Workup showed the below injuries along with their management:  Possible chronic change at C3-4 vs. Acute fracture - no pain or tenderness; suspect chronic changes.  Will monitor. Right 9+10 rib fractures with small hemothorax - f/u CXR 5/6 without pneumothorax but small ,to slightly enlarged hemothorax. Oxygenating on room air, 1,000 cc on IS. Continue multimodal pain control and pulm toilet. Grade 2 splenic laceration - K Centra given 5/3, Eliquis  held.  Abd exam benign.  Hemodynamically stable and no active extravasation noted on CT.  Hgb stable. Diet advanced as tolerated.     FEN - Regular diet VTE - Sequential Compression Devices, medical ppx on hold with splenic laceration ID - None  On  5/5 the patients vitals were stable, pain controlled, tolerating PO, mobilizing and felt stable for discharge. She worked with PT/OT who recommended outpatient PT. She has the support of her kids, siblings,  and friends at discharge. Follow up as below.   I have personally reviewed the patients medication history on the [place] controlled substance database.   Physical Exam: General:  Alert, NAD, pleasant, comfortable Abd:  Soft, ND, non-tender Pulm: normal effort on room air, 1,000 cc on IS, CTAB, diminished right lung base  Ext: symmetrical, no deformity, no edema   Allergies as of 12/19/2023       Reactions   Alpha-gal  Anaphylaxis   Levaquin [levofloxacin In D5w] Other (See Comments)   Severe join pain, muscle pain, and tendonitis   Pravastatin  Other (See Comments)   Muscle pain and weakness   Sulfa Antibiotics Hives   Augmentin [amoxicillin-pot Clavulanate] Diarrhea   Doxycycline  Rash   Erythromycin Swelling, Rash   Polysporin [bacitracin-polymyxin B] Other (See Comments)   Blisters         Medication List     TAKE these medications    acetaminophen  500 MG tablet Commonly known as: TYLENOL  Take 1,000 mg by mouth every 6 (six) hours as needed for moderate pain or headache.   apixaban  5 MG Tabs tablet Commonly known as: Eliquis  Take 1 tablet (5 mg total) by mouth 2 (two) times daily. Start taking on: Dec 20, 2023   bisoprolol  10 MG tablet Commonly known as: ZEBETA  TAKE 1 TABLET BY MOUTH IN THE MORNING AND 1/2 AT BEDTIME.   cholecalciferol 25 MCG (1000 UNIT) tablet Commonly known as: VITAMIN D3 Take 1,000 Units by mouth daily.   diltiazem  360 MG 24 hr capsule Commonly known as: CARDIZEM  CD TAKE ONE CAPSULE BY MOUTH ONCE DAILY   docusate sodium  100 MG capsule Commonly known as: COLACE Take 100 mg by mouth at bedtime.   famotidine  20 MG tablet Commonly known as: PEPCID  Take 20 mg by mouth 2 (two) times daily.   Maalox Max 400-400-40 MG/5ML suspension Generic drug: alum & mag hydroxide-simeth Take 15 mLs by mouth every 6 (six) hours as needed for indigestion.   methocarbamol  500 MG tablet Commonly known as: ROBAXIN  Take 1 tablet (500 mg total) by mouth every 8  (eight) hours as needed for muscle spasms (pain).   oxyCODONE  5 MG immediate release tablet Commonly known as: Oxy IR/ROXICODONE  Take 1 tablet (5 mg total) by mouth every 4 (four) hours as needed for severe pain (pain score 7-10) (not releived by tylenol ).   polyethylene glycol 17 g packet Commonly known as: MIRALAX / GLYCOLAX Take 17 g by mouth daily as needed (constipation).   Premarin vaginal cream Generic drug: conjugated estrogens Place 1 applicator vaginally daily as needed (prolapse flare).   spironolactone  25 MG tablet Commonly known as: ALDACTONE  TAKE ONE TABLET BY MOUTH EVERY DAY   UNISOM PO Take 1 tablet by mouth at bedtime.   vitamin B-12 100 MCG tablet Commonly known as: CYANOCOBALAMIN  Take 100 mcg by mouth daily.          Follow-up Information     Colene Dauphin, MD. Schedule an appointment as soon as possible for a visit in 1 week(s).   Specialty: Internal Medicine Why: for hospital follow up, bloodwork and vital signs Contact information: [address] [place] [address] [phone]         [facility]. Call.   Why: As needed Contact information: [address] [place]  [phone]                Signed: Michial Akin, [facility] 12/19/2023, 9:14 AM

## 2023-12-19 NOTE — Evaluation (Signed)
 Occupational Therapy Evaluation Patient Details Name: [name] [name] MRN: [id number] DOB: [birth year] Today's Date: 12/19/2023   History of Present Illness   83 y.o. female [facility] to [facility] from [facility] on 12/17/23 after falling. Pt with possible chronic change at C3-C4 vs. Acute fx, R 9 and 10 rib fx with small HTX, grade 2 splenic laceration. PMHx: OA, a-fib, poliomyelitis, HTN, B TKA     Clinical Impressions Patient admitted for the diagnosis above.  PTA she lives at home, and has a lot of support if needed from family and friends.  Patient is largely supervision for ADL completion and mobility with and without an AD.  Patient is scheduled for discharge home today, and will have any needed assist.  OT recommended a bed rail for home use to assist with bed mobility.  OT will follow in the acute setting to address deficits, but no post acute OT is anticipated.       If plan is discharge home, recommend the following:   Assist for transportation;Assistance with cooking/housework     Functional Status Assessment   Patient has had a recent decline in their functional status and demonstrates the ability to make significant improvements in function in a reasonable and predictable amount of time.     Equipment Recommendations   None recommended by OT     Recommendations for Other Services         Precautions/Restrictions   Precautions Precautions: Fall Recall of Precautions/Restrictions: Intact Precaution/Restrictions Comments: R 9-10 rib fxs, chronic C3/C4 changes Restrictions Weight Bearing Restrictions Per Provider Order: No     Mobility Bed Mobility Overal bed mobility: Needs Assistance Bed Mobility: Sidelying to Sit   Sidelying to sit: Min assist            Transfers Overall transfer level: Needs assistance Equipment used: Rolling walker (2 wheels) Transfers: Sit to/from Stand Sit to Stand: Supervision                  Balance Overall balance assessment:  Mild deficits observed, not formally tested                                         ADL either performed or assessed with clinical judgement   ADL                       Lower Body Dressing: Supervision/safety;Sit to/from stand;Sitting/lateral leans   Toilet Transfer: Supervision/safety;Rolling walker (2 wheels)                   Vision Baseline Vision/History: 1 Wears glasses Patient Visual Report: No change from baseline       Perception Perception: Not tested       Praxis Praxis: Not tested       Pertinent Vitals/Pain Pain Assessment Pain Assessment: Faces Faces Pain Scale: Hurts little more Pain Location: R ribs with movement Pain Descriptors / Indicators: Discomfort, Guarding Pain Intervention(s): Monitored during session     Extremity/Trunk Assessment Upper Extremity Assessment Upper Extremity Assessment: Overall WFL for tasks assessed   Lower Extremity Assessment Lower Extremity Assessment: Defer to PT evaluation   Cervical / Trunk Assessment Cervical / Trunk Assessment: Normal   Communication Communication Communication: No apparent difficulties   Cognition Arousal: Alert   Cognition: No apparent impairments  Following commands: Intact       Cueing  General Comments   Cueing Techniques: Verbal cues;Visual cues;Tactile cues      Exercises     Shoulder Instructions      Home Living Family/patient expects to be discharged to:: Private residence Living Arrangements: Alone Available Help at Discharge: Family;Available 24 hours/day Type of Home: House Home Access: Stairs to enter [employer] of Steps: 2 Entrance Stairs-Rails: Right Home Layout: Two level;Able to live on main level with bedroom/bathroom Alternate Level Stairs-Number of Steps: 12 Alternate Level Stairs-Rails: Right Bathroom Shower/Tub: Producer, television/film/video: Standard Bathroom  Accessibility: Yes How Accessible: Accessible via walker Home Equipment: Rollator (4 wheels);Shower seat;BSC/3in1          Prior Functioning/Environment                 ADLs Comments: Ind    OT Problem List: Impaired balance (sitting and/or standing)   OT Treatment/Interventions: Self-care/ADL training;Therapeutic activities;Balance training;DME and/or AE instruction      OT Goals(Current goals can be found in the care plan section)   Acute Rehab OT Goals Patient Stated Goal: Return home OT Goal Formulation: With patient Time For Goal Achievement: 01/02/24 Potential to Achieve Goals: Good ADL Goals Pt Will Perform Lower Body Dressing: Independently;sit to/from stand Pt Will Transfer to Toilet: Independently;ambulating;regular height toilet   OT Frequency:  Min 2X/week    Co-evaluation              AM-PAC OT "6 Clicks" Daily Activity     Outcome Measure Help from another person eating meals?: None Help from another person taking care of personal grooming?: A Little Help from another person toileting, which includes using toliet, bedpan, or urinal?: A Little Help from another person bathing (including washing, rinsing, drying)?: A Little Help from another person to put on and taking off regular upper body clothing?: None Help from another person to put on and taking off regular lower body clothing?: A Little 6 Click Score: 20   End of Session Equipment Utilized During Treatment: Rolling walker (2 wheels) Nurse Communication: Mobility status  Activity Tolerance: Patient tolerated treatment well Patient left: in bed;with call bell/phone within reach  OT Visit Diagnosis: Unsteadiness on feet (R26.81)                Time: 1478-2956 OT Time Calculation (min): 16 min Charges:  OT General Charges $OT Visit: 1 Visit OT Evaluation $OT Eval Moderate Complexity: 1 Mod  12/19/2023  RP, OTR/L  Acute Rehabilitation Services  Office:   [phone]   Benjamen Brand 12/19/2023, 9:29 AM

## 2023-12-20 ENCOUNTER — Telehealth: Payer: Self-pay

## 2023-12-20 NOTE — Transitions of Care (Post Inpatient/ED Visit) (Signed)
 12/20/2023  Name: [name] [name] MRN: [id number] DOB: 10/20/1940  Today's TOC FU Call Status: Today's TOC FU Call Status:: Successful TOC FU Call Completed TOC FU Call Complete Date: 12/20/23 Patient's Name and Date of Birth confirmed.  Transition Care Management Follow-up Telephone Call Date of Discharge: 12/19/23 Discharge Facility: [facility]) Type of Discharge: Inpatient Admission Primary Inpatient Discharge Diagnosis:: fx ribs-fall How have you been since you were released from the hospital?: Better Any questions or concerns?: No  Items Reviewed: Did you receive and understand the discharge instructions provided?: Yes Medications obtained,verified, and reconciled?: Yes (Medications Reviewed) Any new allergies since your discharge?: No Dietary orders reviewed?: NA Do you have support at home?: Yes People in Home [RPT]: child(ren), adult  Medications Reviewed Today: Medications Reviewed Today     Reviewed by Darrall Ellison, LPN (Licensed Practical Nurse) on 12/20/23 at 1034  Med List Status: <None>   Medication Order Taking? Sig Documenting Provider Last Dose Status Informant  acetaminophen  (TYLENOL ) 500 MG tablet [id number] No Take 1,000 mg by mouth every 6 (six) hours as needed for moderate pain or headache. [provider] Past Month Active Self           Med Note Timm Foot, Michae Aden   Fri Sep 16, 2023  3:11 PM)    alum & mag hydroxide-simeth (MAALOX MAX) 400-400-40 MG/5ML suspension [id number]  Take 15 mLs by mouth every 6 (six) hours as needed for indigestion. Tonya Fredrickson, MD  Active   apixaban  (ELIQUIS ) 5 MG TABS tablet [id number]  Take 1 tablet (5 mg total) by mouth 2 (two) times daily. Simaan, Elizabeth S, PA-C  Active   bisoprolol  (ZEBETA ) 10 MG tablet [id number] No TAKE 1 TABLET BY MOUTH IN THE MORNING AND 1/2 AT BEDTIME. Lenise Quince, MD 12/17/2023 Morning Active Self  cholecalciferol (VITAMIN D3) 25 MCG (1000 UNIT) tablet [id number] No Take 1,000  Units by mouth daily. [provider] 12/17/2023 Morning Active Self  conjugated estrogens (PREMARIN) vaginal cream [id number] No Place 1 applicator vaginally daily as needed (prolapse flare). [provider] Past Month Active Self  diltiazem  (CARDIZEM  CD) 360 MG 24 hr capsule [id number] No TAKE ONE CAPSULE BY MOUTH ONCE DAILY Lenise Quince, MD 12/17/2023 Morning Active Self  docusate sodium  (COLACE) 100 MG capsule [id number] No Take 100 mg by mouth at bedtime. [provider] 12/16/2023 Bedtime Active Self  Doxylamine Succinate, Sleep, (UNISOM PO) [id number] No Take 1 tablet by mouth at bedtime. [provider] 12/16/2023 Bedtime Active Self  famotidine  (PEPCID ) 20 MG tablet [id number] No Take 20 mg by mouth 2 (two) times daily. [provider] 12/17/2023 Morning Active Self  methocarbamol  (ROBAXIN ) 500 MG tablet [id number]  Take 1 tablet (500 mg total) by mouth every 8 (eight) hours as needed for muscle spasms (pain). Simaan, Elizabeth S, PA-C  Active   oxyCODONE  (OXY IR/ROXICODONE ) 5 MG immediate release tablet [id number]  Take 1 tablet (5 mg total) by mouth every 4 (four) hours as needed for severe pain (pain score 7-10) (not releived by tylenol ). Simaan, Elizabeth S, PA-C  Active   polyethylene glycol (MIRALAX / GLYCOLAX) 17 g packet [id number]  Take 17 g by mouth daily as needed (constipation). Simaan, Elizabeth S, PA-C  Active   spironolactone  (ALDACTONE ) 25 MG tablet [id number] No TAKE ONE TABLET BY MOUTH EVERY DAY Lenise Quince, MD 12/17/2023 Morning Active Self  vitamin B-12 (CYANOCOBALAMIN ) 100 MCG tablet [id number] No Take 100 mcg by mouth daily. [provider]  12/17/2023 Morning Active Self            Home Care and Equipment/Supplies: Were Home Health Services Ordered?: NA Any new equipment or medical supplies ordered?: NA  Functional Questionnaire: Do you need assistance with bathing/showering or dressing?: Yes Do you need assistance with  meal preparation?: Yes Do you need assistance with eating?: No Do you have difficulty maintaining continence: No Do you need assistance with getting out of bed/getting out of a chair/moving?: Yes Do you have difficulty managing or taking your medications?: No  Follow up appointments reviewed: PCP Follow-up appointment confirmed?: Yes Date of PCP follow-up appointment?: 01/02/24 Follow-up Provider: [facility] Follow-up appointment confirmed?: NA Do you need transportation to your follow-up appointment?: No Do you understand care options if your condition(s) worsen?: Yes-patient verbalized understanding    SIGNATURE Darrall Ellison, LPN [facility] Nurse Health Advisor Direct Dial [phone]

## 2023-12-26 ENCOUNTER — Encounter (HOSPITAL_COMMUNITY): Payer: Self-pay

## 2024-01-01 DIAGNOSIS — S2231XA Fracture of one rib, right side, initial encounter for closed fracture: Secondary | ICD-10-CM | POA: Insufficient documentation

## 2024-01-01 NOTE — Progress Notes (Signed)
 Subjective:    Patient ID: [name] [name], female    DOB: 1941/05/09, 83 y.o.   MRN: [id number]     HPI [name] [name] is here for follow up from the hospital   Admitted 5/3-5/5  She fell 5/3 at home-she was gardening and bent over her and lost her balance falling backwards.  She did strike her head.  She had mild neck pain, right ankle pain.  She denied chest pain, shortness of breath or abdominal pain.  He had tenderness over the thoracic and lumbar spines.  She also had tenderness over her right flank.  She is on chronic anticoagulation.  Imaging including CT head, cervical spine, chest/abdomen/pelvis and right ankle x-ray done.  No intracranial hemorrhage or vertebral fracture.  She did have right-sided rib fractures (displaced ninth posterior lateral right rib, nondisplaced 10th posterior rib) with small hemothorax, minimal right hemothorax and a grade 2 splenic laceration.  EKG-atrial fibrillation without acute change.  Trauma surgery consulted.  She had reversal of her anticoagulation with Kcentra .  Eliquis  held.  Hemoglobin remained stable.  Pain was adequately controlled.  PT/OT recommended outpatient PT.  She was discharged home on oxycodone  5 mg every 4 hours as needed for severe pain.  She stopped the oxycodone  - it did not help and it made her feel not good and caused constipation.    She is eating a lot of fruits and veges for her bowels.     Has been taking tramadol  she had left over from knee surgery in the morning with tylenol  and a muscle relaxer.  She started taking that combo twice a day.  This has helped.  Her pain has improved.   Medications and allergies reviewed with patient and updated if appropriate.  Current Outpatient Medications on File Prior to Visit  Medication Sig Dispense Refill   acetaminophen  (TYLENOL ) 500 MG tablet Take 1,000 mg by mouth every 6 (six) hours as needed for moderate pain or headache.     alum & mag hydroxide-simeth (MAALOX MAX) 400-400-40 MG/5ML  suspension Take 15 mLs by mouth every 6 (six) hours as needed for indigestion. 355 mL 0   apixaban  (ELIQUIS ) 5 MG TABS tablet Take 1 tablet (5 mg total) by mouth 2 (two) times daily. 180 tablet 1   bisoprolol  (ZEBETA ) 10 MG tablet TAKE 1 TABLET BY MOUTH IN THE MORNING AND 1/2 AT BEDTIME. 135 tablet 2   cholecalciferol (VITAMIN D3) 25 MCG (1000 UNIT) tablet Take 1,000 Units by mouth daily.     conjugated estrogens (PREMARIN) vaginal cream Place 1 applicator vaginally daily as needed (prolapse flare).     diltiazem  (CARDIZEM  CD) 360 MG 24 hr capsule TAKE ONE CAPSULE BY MOUTH ONCE DAILY 90 capsule 3   docusate sodium  (COLACE) 100 MG capsule Take 100 mg by mouth at bedtime.     Doxylamine  Succinate, Sleep, (UNISOM  PO) Take 1 tablet by mouth at bedtime.     famotidine  (PEPCID ) 20 MG tablet Take 20 mg by mouth 2 (two) times daily.     methocarbamol  (ROBAXIN ) 500 MG tablet Take 1 tablet (500 mg total) by mouth every 8 (eight) hours as needed for muscle spasms (pain). 40 tablet 0   polyethylene glycol (MIRALAX  / GLYCOLAX ) 17 g packet Take 17 g by mouth daily as needed (constipation).     spironolactone  (ALDACTONE ) 25 MG tablet TAKE ONE TABLET BY MOUTH EVERY DAY 90 tablet 3   vitamin B-12 (CYANOCOBALAMIN ) 100 MCG tablet Take 100 mcg by  mouth daily.     oxyCODONE  (OXY IR/ROXICODONE ) 5 MG immediate release tablet Take 1 tablet (5 mg total) by mouth every 4 (four) hours as needed for severe pain (pain score 7-10) (not releived by tylenol ). (Patient not taking: Reported on 01/02/2024) 20 tablet 0   No current facility-administered medications on file prior to visit.     Review of Systems  Constitutional:  Negative for chills and fever.  Respiratory:  Positive for shortness of breath (when she first got home  - improved). Negative for cough and wheezing.   Cardiovascular:  Positive for leg swelling (chronic - no change). Negative for chest pain and palpitations.  Neurological:  Positive for dizziness (? with  opiods) and light-headedness (? with opiods). Negative for headaches.       Objective:   Vitals:   01/02/24 1304  BP: 134/70  Pulse: 68  Temp: 98 F (36.7 C)  SpO2: 95%   BP Readings from Last 3 Encounters:  01/02/24 134/70  12/19/23 122/64  12/07/23 120/60   Wt Readings from Last 3 Encounters:  01/02/24 155 lb (70.3 kg)  12/17/23 156 lb 8.4 oz (71 kg)  12/07/23 156 lb 6.4 oz (70.9 kg)   Body mass index is 25.79 kg/m.    Physical Exam Constitutional:      General: She is not in acute distress.    Appearance: Normal appearance.  HENT:     Head: Normocephalic and atraumatic.  Eyes:     Conjunctiva/sclera: Conjunctivae normal.  Cardiovascular:     Rate and Rhythm: Normal rate and regular rhythm.     Heart sounds: Normal heart sounds.  Pulmonary:     Effort: Pulmonary effort is normal. No respiratory distress.     Breath sounds: Normal breath sounds. No wheezing.  Musculoskeletal:     Cervical back: Neck supple.     Right lower leg: No edema.     Left lower leg: No edema.  Lymphadenopathy:     Cervical: No cervical adenopathy.  Skin:    General: Skin is warm and dry.     Findings: No rash.  Neurological:     Mental Status: She is alert. Mental status is at baseline.  Psychiatric:        Mood and Affect: Mood normal.        Behavior: Behavior normal.        Lab Results  Component Value Date   WBC 7.7 12/19/2023   HGB 11.4 (L) 12/19/2023   HCT 35.1 (L) 12/19/2023   PLT 215 12/19/2023   GLUCOSE 108 (H) 12/19/2023   CHOL 218 (H) 01/31/2023   TRIG 115.0 01/31/2023   HDL 60.30 01/31/2023   LDLDIRECT 128.6 12/09/2010   LDLCALC 134 (H) 01/31/2023   ALT 16 12/17/2023   AST 19 12/17/2023   NA 136 12/19/2023   K 5.1 12/19/2023   CL 104 12/19/2023   CREATININE 1.19 (H) 12/19/2023   BUN 19 12/19/2023   CO2 25 12/19/2023   TSH 0.93 01/31/2023   INR 1.5 (H) 12/17/2023   HGBA1C 5.4 01/31/2023   DG CHEST PORT 1 VIEW CLINICAL DATA:  [id number].  Rib  fractures.  EXAM: PORTABLE CHEST 1 VIEW  COMPARISON:  Portable 12/17/2023  FINDINGS: 7:09 a.m. There is a small right pleural effusion or hemothorax, slightly increased.  Increased linear atelectasis is noted in the right base with remaining lungs generally clear.  The cardiomediastinal silhouette vasculature are normal apart calcification in the transverse aorta.  Slightly displaced fracture of  posterolateral right eighth rib.  Other rib fractures described in the CT report are not well seen. There is no measurable pneumothorax.  IMPRESSION: 1. Small right pleural effusion or hemothorax, slightly increased. 2. Increased linear atelectasis in the right base. 3. Slightly displaced fracture of posterolateral right eighth rib. 4. Other rib fractures described in the CT report are not well seen.  Electronically Signed   By: Denman Fischer M.D.   On: 12/19/2023 07:26    Assessment & Plan:    See Problem List for Assessment and Plan of chronic medical problems.

## 2024-01-02 ENCOUNTER — Ambulatory Visit (INDEPENDENT_AMBULATORY_CARE_PROVIDER_SITE_OTHER)

## 2024-01-02 ENCOUNTER — Encounter: Payer: Self-pay | Admitting: Internal Medicine

## 2024-01-02 ENCOUNTER — Ambulatory Visit: Admitting: Internal Medicine

## 2024-01-02 VITALS — BP 134/70 | HR 68 | Temp 98.0°F | Ht 65.0 in | Wt 155.0 lb

## 2024-01-02 DIAGNOSIS — S2241XA Multiple fractures of ribs, right side, initial encounter for closed fracture: Secondary | ICD-10-CM | POA: Diagnosis not present

## 2024-01-02 DIAGNOSIS — J90 Pleural effusion, not elsewhere classified: Secondary | ICD-10-CM | POA: Diagnosis not present

## 2024-01-02 DIAGNOSIS — I10 Essential (primary) hypertension: Secondary | ICD-10-CM | POA: Diagnosis not present

## 2024-01-02 DIAGNOSIS — S36039A Unspecified laceration of spleen, initial encounter: Secondary | ICD-10-CM

## 2024-01-02 DIAGNOSIS — S2241XD Multiple fractures of ribs, right side, subsequent encounter for fracture with routine healing: Secondary | ICD-10-CM | POA: Diagnosis not present

## 2024-01-02 DIAGNOSIS — M4135 Thoracogenic scoliosis, thoracolumbar region: Secondary | ICD-10-CM | POA: Diagnosis not present

## 2024-01-02 LAB — BASIC METABOLIC PANEL WITH GFR
BUN: 28 mg/dL — ABNORMAL HIGH (ref 6–23)
CO2: 27 meq/L (ref 19–32)
Calcium: 10.1 mg/dL (ref 8.4–10.5)
Chloride: 101 meq/L (ref 96–112)
Creatinine, Ser: 1.06 mg/dL (ref 0.40–1.20)
GFR: 48.69 mL/min — ABNORMAL LOW (ref >60.00)
Glucose, Bld: 79 mg/dL (ref 70–99)
Potassium: 4.7 meq/L (ref 3.5–5.1)
Sodium: 137 meq/L (ref 135–145)

## 2024-01-02 LAB — CBC WITH DIFFERENTIAL/PLATELET
Basophils Absolute: 0.1 10*3/uL (ref 0.0–0.1)
Basophils Relative: 0.8 % (ref 0.0–3.0)
Eosinophils Absolute: 0.2 10*3/uL (ref 0.0–0.7)
Eosinophils Relative: 2.1 % (ref 0.0–5.0)
HCT: 39.1 % (ref 36.0–46.0)
Hemoglobin: 13.2 g/dL (ref 12.0–15.0)
Lymphocytes Relative: 20.2 % (ref 12.0–46.0)
Lymphs Abs: 1.7 10*3/uL (ref 0.7–4.0)
MCHC: 33.7 g/dL (ref 30.0–36.0)
MCV: 99 fl (ref 78.0–100.0)
Monocytes Absolute: 1 10*3/uL (ref 0.1–1.0)
Monocytes Relative: 11.6 % (ref 3.0–12.0)
Neutro Abs: 5.4 10*3/uL (ref 1.4–7.7)
Neutrophils Relative %: 65.3 % (ref 43.0–77.0)
Platelets: 320 10*3/uL (ref 150.0–400.0)
RBC: 3.95 Mil/uL (ref 3.87–5.11)
RDW: 14.3 % (ref 11.5–15.5)
WBC: 8.3 10*3/uL (ref 4.0–10.5)

## 2024-01-02 MED ORDER — TRAMADOL HCL 50 MG PO TABS: 50.0000 mg | ORAL_TABLET | Freq: Three times a day (TID) | ORAL | 0 refills | Status: AC | PRN

## 2024-01-02 NOTE — Assessment & Plan Note (Signed)
 Chronic Blood pressure controlled Continue bisoprolol  10 mg in the morning, 5 mg at bedtime, diltiazem  360 mg daily, spironolactone  25 mg daily

## 2024-01-02 NOTE — Assessment & Plan Note (Addendum)
 Acute Has fx of 9th and 10th right posterior-lateral ribs Pain has improved but still has significant pain Stopped oxycodone  due to side effects Start tramadol  50 mg bid with methocarbamol  and tylenol  If pain does not continue to improve she will let me know I will renew tramadol  if needed Rib - cxr today to evaluate fx and hemothorax

## 2024-01-02 NOTE — Patient Instructions (Addendum)
      Blood work was ordered.   A chest xray was ordered.      Medications changes include :   tramadol  50 mg twice a day as needed

## 2024-01-02 NOTE — Assessment & Plan Note (Signed)
 Acute Seen on Ct scan - but she did not hit the left side of her abdomen Denies ever having any abdominal pain Cbc, bmp today

## 2024-01-03 ENCOUNTER — Ambulatory Visit: Payer: Self-pay | Admitting: Internal Medicine

## 2024-02-06 ENCOUNTER — Other Ambulatory Visit (INDEPENDENT_AMBULATORY_CARE_PROVIDER_SITE_OTHER): Payer: Self-pay

## 2024-02-06 ENCOUNTER — Ambulatory Visit (INDEPENDENT_AMBULATORY_CARE_PROVIDER_SITE_OTHER): Admitting: Orthopaedic Surgery

## 2024-02-06 ENCOUNTER — Encounter: Payer: Self-pay | Admitting: Orthopaedic Surgery

## 2024-02-06 DIAGNOSIS — Z96652 Presence of left artificial knee joint: Secondary | ICD-10-CM

## 2024-02-06 NOTE — Progress Notes (Signed)
 The patient is an 83 year old who is now just over 4 months status post a left total knee arthroplasty.  She the knee is done very well but she did have a mechanical fall in early May in which she actually fractured 2 ribs as well as sustained a spleen laceration and was hospitalized.  She said she is doing much better now but would really like outpatient physical therapy to work on her balance, coordination and stamina.  Her left operative knee has excellent range of motion.  There is some swelling to still be expected but overall the leg and knee look good.   2 views left knee show well-seated total knee arthroplasty with no complicating features.  I did give her a handwritten prescription for outpatient physical therapy for someone she has seen in the past to work on her balance and coordination as well as stamina.  From her orthopedic standpoint, we will see her back in 6 months unless she is having issues.  Will have a final AP and lateral of her left operative knee at that visit.  She does have remote history of a right total knee replacement that has done well.

## 2024-02-15 DIAGNOSIS — J069 Acute upper respiratory infection, unspecified: Secondary | ICD-10-CM | POA: Diagnosis not present

## 2024-02-16 ENCOUNTER — Telehealth: Payer: Self-pay | Admitting: Cardiology

## 2024-02-16 ENCOUNTER — Telehealth: Payer: Self-pay | Admitting: *Deleted

## 2024-02-16 NOTE — Telephone Encounter (Signed)
 Please see the other telephone note encounter from today for further documentation.

## 2024-02-16 NOTE — Telephone Encounter (Signed)
 Called and spoke to pt and instructed her to monitor for any signs of bleeding. Instructed her to hold her Eliquis  tonight and resume in the morning. Informed pt to call if she has any other questions.

## 2024-02-16 NOTE — Telephone Encounter (Signed)
 Pt called and stated that she has bronchitis.  She typically takes Eliquis  5mg  BID at 10:30 am and 10:30 pm. Pt stated that today she accidentally took Eliquis  5mg  at 10:30am and at 12:00 pm.

## 2024-02-16 NOTE — Telephone Encounter (Signed)
 Pt c/o medication issue:  1. Name of Medication: apixaban  (ELIQUIS ) 5 MG TABS tablet   2. How are you currently taking this medication (dosage and times per day)? As written   3. Are you having a reaction (difficulty breathing--STAT)? No   4. What is your medication issue? Pt called in stating she accidentally took two pills an hour apart instead of 1 in the AM and 1 in the PM. She states she hasn't had any symptoms but read online that she may so she is concerned. Please advise. Call transferred to [facility] at coumadin clinic

## 2024-02-20 DIAGNOSIS — R197 Diarrhea, unspecified: Secondary | ICD-10-CM | POA: Diagnosis not present

## 2024-02-20 DIAGNOSIS — J069 Acute upper respiratory infection, unspecified: Secondary | ICD-10-CM | POA: Diagnosis not present

## 2024-02-23 DIAGNOSIS — R197 Diarrhea, unspecified: Secondary | ICD-10-CM | POA: Diagnosis not present

## 2024-03-05 DIAGNOSIS — D225 Melanocytic nevi of trunk: Secondary | ICD-10-CM | POA: Diagnosis not present

## 2024-03-05 DIAGNOSIS — L814 Other melanin hyperpigmentation: Secondary | ICD-10-CM | POA: Diagnosis not present

## 2024-03-05 DIAGNOSIS — L720 Epidermal cyst: Secondary | ICD-10-CM | POA: Diagnosis not present

## 2024-03-05 DIAGNOSIS — I788 Other diseases of capillaries: Secondary | ICD-10-CM | POA: Diagnosis not present

## 2024-03-05 DIAGNOSIS — L821 Other seborrheic keratosis: Secondary | ICD-10-CM | POA: Diagnosis not present

## 2024-03-05 DIAGNOSIS — Z85828 Personal history of other malignant neoplasm of skin: Secondary | ICD-10-CM | POA: Diagnosis not present

## 2024-03-09 ENCOUNTER — Ambulatory Visit (INDEPENDENT_AMBULATORY_CARE_PROVIDER_SITE_OTHER)

## 2024-03-09 DIAGNOSIS — B351 Tinea unguium: Secondary | ICD-10-CM

## 2024-03-09 NOTE — Progress Notes (Signed)
 Patient presents today for laser maintenance treatment. Diagnosed with mycotic nail infection by Dr. Tobie.   Toenail most affected 1st, 3rd and 5th right foot.  All other systems are negative.  Nails were trimmed and filed thin. Laser therapy was administered to 1-5 toenails bilaterally and patient tolerated the treatment well. All safety precautions were in place.    Follow up in 3 months for laser maintenance.

## 2024-04-15 ENCOUNTER — Other Ambulatory Visit: Payer: Self-pay | Admitting: Cardiology

## 2024-04-18 DIAGNOSIS — Z1231 Encounter for screening mammogram for malignant neoplasm of breast: Secondary | ICD-10-CM | POA: Diagnosis not present

## 2024-04-18 LAB — HM MAMMOGRAPHY

## 2024-04-20 ENCOUNTER — Ambulatory Visit: Payer: Medicare PPO

## 2024-04-20 ENCOUNTER — Encounter: Payer: Self-pay | Admitting: Internal Medicine

## 2024-04-20 VITALS — Ht 65.0 in | Wt 156.0 lb

## 2024-04-20 DIAGNOSIS — R928 Other abnormal and inconclusive findings on diagnostic imaging of breast: Secondary | ICD-10-CM | POA: Diagnosis not present

## 2024-04-20 DIAGNOSIS — Z0000 Encounter for general adult medical examination without abnormal findings: Secondary | ICD-10-CM | POA: Diagnosis not present

## 2024-04-20 LAB — HM MAMMOGRAPHY

## 2024-04-20 NOTE — Progress Notes (Signed)
 Subjective:   [name] [name] is a 83 y.o. who presents for a Medicare Wellness preventive visit.  As a reminder, Annual Wellness Visits don't include a physical exam, and some assessments may be limited, especially if this visit is performed virtually. We may recommend an in-person follow-up visit with your provider if needed.  Visit Complete: Virtual I connected with  [name] [name] on 04/20/24 by a audio enabled telemedicine application and verified that I am speaking with the correct person using two identifiers.  Patient Location: Home  Provider Location: Office/Clinic  I discussed the limitations of evaluation and management by telemedicine. The patient expressed understanding and agreed to proceed.  Vital Signs: Because this visit was a virtual/telehealth visit, some criteria may be missing or patient reported. Any vitals not documented were not able to be obtained and vitals that have been documented are patient reported.  VideoDeclined- This patient declined Librarian, academic. Therefore the visit was completed with audio only.  Persons Participating in Visit: Patient.  AWV Questionnaire: No: Patient Medicare AWV questionnaire was not completed prior to this visit.  Cardiac Risk Factors include: advanced age (>68men, >72 women);dyslipidemia;hypertension     Objective:    Today's Vitals   04/20/24 1013  Weight: 156 lb (70.8 kg)  Height: 5' 5 (1.651 m)   Body mass index is 25.96 kg/m.     04/20/2024   10:13 AM 12/18/2023    7:37 PM 09/27/2023    5:00 PM 09/27/2023    7:03 AM 09/21/2023    1:09 PM 04/15/2023    1:09 PM 02/25/2023    1:58 PM  Advanced Directives  Does Patient Have a Medical Advance Directive? Yes Unable to assess, patient is non-responsive or altered mental status Yes No No;Yes Yes Yes  Type of Advance Directive Healthcare Power of [place];Living will  Healthcare Power of [place];Living will  Healthcare Power of [place];Living will  Healthcare Power of [place];Living will Healthcare Power of [place];Living will  Does patient want to make changes to medical advance directive? No - Patient declined  No - Patient declined  No - Guardian declined No - Patient declined No - Patient declined  Copy of Healthcare Power of Attorney in Chart? Yes - validated most recent copy scanned in chart (See row information)  No - copy requested  No - copy requested Yes - validated most recent copy scanned in chart (See row information)   Would patient like information on creating a medical advance directive?   No - Patient declined No - Patient declined       Current Medications (verified) Outpatient Encounter Medications as of 04/20/2024  Medication Sig   acetaminophen  (TYLENOL ) 500 MG tablet Take 1,000 mg by mouth every 6 (six) hours as needed for moderate pain or headache.   alum & mag hydroxide-simeth (MAALOX MAX) 400-400-40 MG/5ML suspension Take 15 mLs by mouth every 6 (six) hours as needed for indigestion.   apixaban  (ELIQUIS ) 5 MG TABS tablet Take 1 tablet (5 mg total) by mouth 2 (two) times daily.   bisoprolol  (ZEBETA ) 10 MG tablet TAKE ONE TABLET BY MOUTH EVERY MORNING and TAKE 1/2 TABLET AT BEDTIME   cholecalciferol (VITAMIN D3) 25 MCG (1000 UNIT) tablet Take 1,000 Units by mouth daily.   conjugated estrogens (PREMARIN) vaginal cream Place 1 applicator vaginally daily as needed (prolapse flare).   diltiazem  (CARDIZEM  CD) 360 MG 24 hr capsule TAKE ONE CAPSULE BY MOUTH ONCE DAILY   docusate sodium  (COLACE) 100 MG capsule  Take 100 mg by mouth at bedtime.   Doxylamine  Succinate, Sleep, (UNISOM  PO) Take 1 tablet by mouth at bedtime.   famotidine  (PEPCID ) 20 MG tablet Take 20 mg by mouth 2 (two) times daily.   methocarbamol  (ROBAXIN ) 500 MG tablet Take 1 tablet (500 mg total) by mouth every 8 (eight) hours as needed for muscle spasms (pain).   polyethylene glycol (MIRALAX  / GLYCOLAX ) 17 g packet Take 17 g by mouth daily as needed  (constipation).   spironolactone  (ALDACTONE ) 25 MG tablet TAKE ONE TABLET BY MOUTH EVERY DAY   vitamin B-12 (CYANOCOBALAMIN ) 100 MCG tablet Take 100 mcg by mouth daily.   No facility-administered encounter medications on file as of 04/20/2024.    Allergies (verified) Alpha-gal, Levaquin [levofloxacin in d5w], Pravastatin , Sulfa antibiotics, Augmentin [amoxicillin-pot clavulanate], Doxycycline , Erythromycin, and Polysporin [bacitracin-polymyxin b]   History: Past Medical History:  Diagnosis Date   Allergic rhinitis    takes Benadryl  at bedtime   Allergy    Anemia    Angio-edema    Arthritis    osteoarthritis   Atrial fibrillation (HCC) 06/10/2011   takes Diltiazem  and Pradaxa  daily   Cancer (HCC)    skin   Cataract    bilateral cataracts removed   Clotting disorder (HCC)    on PRADAXA  - chronic a fib   Collagenous colitis    recurrent, takes Budesonide  daily as needed    Colon polyps 01/14/2010   Hyperplastic   Cough    states every day of her life and every chest xray is always clear   GERD (gastroesophageal reflux disease)    takes Omeprazole  daily   History of poliomyelitis    Polio  age 70- no significant neuromuscular deficit   Hyperlipidemia    was on Pravastatin  but joint pain;has been off for 2-60months ;takes CoQ10   Hypertension    takes Bisoprolol  and Chlorthalidone  daily   Potassium deficiency    takes KDUR daily   Past Surgical History:  Procedure Laterality Date   ADENOIDECTOMY     APPENDECTOMY     at age 72   BREAST BIOPSY  1947 & 10/07   Benign   CARDIOVERSION N/A 11/05/2016   Procedure: CARDIOVERSION;  Surgeon: Wilbert JONELLE Bihari, MD;  Location: [facility];  Service: Cardiovascular;  Laterality: N/A;   cataracts removed     COLONOSCOPY     ENDOVENOUS ABLATION SAPHENOUS VEIN W/ LASER  03-03-2011 left greater saphenous vein    ENDOVENOUS ABLATION SAPHENOUS VEIN W/ LASER  01-06-2011  right greater saphenous vein   epidural infection     x 2    IR  RADIOLOGIST EVAL & MGMT  03/07/2017   SKIN CANCER EXCISION     removal of skin cancer on left leg   TONSILLECTOMY     as a child   TONSILLECTOMY     TOTAL KNEE ARTHROPLASTY Right 09/18/2013   Procedure: TOTAL KNEE ARTHROPLASTY;  Surgeon: Maude LELON Right, MD;  Location: [facility] OR;  Service: Orthopedics;  Laterality: Right;   TOTAL KNEE ARTHROPLASTY Left 09/27/2023   Procedure: LEFT TOTAL KNEE ARTHROPLASTY;  Surgeon: Vernetta Lonni GRADE, MD;  Location: [facility] OR;  Service: Orthopedics;  Laterality: Left;   Family History  Problem Relation Age of Onset   Stomach cancer Maternal Grandfather    Heart disease Paternal Grandfather    Colon cancer Son    Rectal cancer Son 73   Colon cancer Paternal Grandmother    Alcohol abuse Father    Heart disease Father  Stroke Father    Hypertension Father    Hypertension Sister    Other Sister        varicose veins   Peripheral vascular disease Mother    Other Mother        varicose veins   Esophageal cancer Neg Hx    Pancreatic cancer Neg Hx    Social History   Socioeconomic History   Marital status: Widowed    Spouse name: Not on file   Number of children: 2   Years of education: Not on file   Highest education level: Not on file  Occupational History   Occupation: retired    Comment: helps people with food, keeping power on, etc in [place]  Tobacco Use   Smoking status: Former    Current packs/day: 0.00    Average packs/day: 1 pack/day for 18.0 years (18.0 ttl pk-yrs)    Types: Cigarettes    Start date: 08/16/1969    Quit date: 08/17/1987    Years since quitting: 36.7   Smokeless tobacco: Never   Tobacco comments:    quit smoking 1989  Vaping Use   Vaping status: Never Used  Substance and Sexual Activity   Alcohol use: Yes    Alcohol/week: 7.0 standard drinks of alcohol    Types: 7 Glasses of wine per week    Comment: wine - 1-2 glasses a day   Drug use: No   Sexual activity: Not Currently    Partners: Male    Birth  control/protection: Surgical  Other Topics Concern   Not on file  Social History Narrative   Retired Film/video editor    2 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   Married     Former Smoker quit 1989    Alcohol use-yes -  glasses of wine/day      son works for Event organiser - she flies to [place] to visit her grandchildren   Social Drivers of Corporate investment banker Strain: Low Risk  (04/20/2024)   Overall Financial Resource Strain (CARDIA)    Difficulty of Paying Living Expenses: Not hard at all  Food Insecurity: No Food Insecurity (04/20/2024)   Hunger Vital Sign    Worried About Running Out of Food in the Last Year: Never true    Ran Out of Food in the Last Year: Never true  Transportation Needs: No Transportation Needs (04/20/2024)   PRAPARE - Administrator, Civil Service (Medical): No    Lack of Transportation (Non-Medical): No  Physical Activity: Sufficiently Active (04/20/2024)   Exercise Vital Sign    Days of Exercise per Week: 5 days    Minutes of Exercise per Session: 60 min  Stress: No Stress Concern Present (04/20/2024)   [employer] of Occupational Health - Occupational Stress Questionnaire    Feeling of Stress: Not at all  Social Connections: Moderately Integrated (04/20/2024)   Social Connection and Isolation Panel    Frequency of Communication with Friends and Family: More than three times a week    Frequency of Social Gatherings with Friends and Family: More than three times a week    Attends Religious Services: More than 4 times per year    Active Member of [employer] or Organizations: Yes    Attends Banker Meetings: More than 4 times per year    Marital Status: Widowed    Tobacco Counseling Counseling given: Not Answered Tobacco comments: quit smoking 1989    Clinical Intake:  Pre-visit preparation completed: Yes  Pain :  No/denies pain     BMI - recorded: 25.96 Nutritional Status: BMI 25 -29 Overweight Nutritional Risks: None Diabetes: No  Lab Results   Component Value Date   HGBA1C 5.4 01/31/2023   HGBA1C 5.2 12/29/2021   HGBA1C 5.3 11/19/2020     How often do you need to have someone help you when you read instructions, pamphlets, or other written materials from your doctor or pharmacy?: 1 - Never  Interpreter Needed?: No  Information entered by :: Verdie Saba, CMA   Activities of Daily Living     04/20/2024   10:20 AM 09/27/2023    5:29 PM  In your present state of health, do you have any difficulty performing the following activities:  Hearing? 0 0  Vision? 0 0  Difficulty concentrating or making decisions? 0 0  Walking or climbing stairs? 0   Dressing or bathing? 0   Doing errands, shopping? 0   Preparing Food and eating ? N   Using the Toilet? N   In the past six months, have you accidently leaked urine? Y   Comment wears a depend at night and a pad during the day   Do you have problems with loss of bowel control? Y   Comment wears a depend at night and a pad during the day   Managing your Medications? N   Managing your Finances? N   Housekeeping or managing your Housekeeping? N     Patient Care Team: Geofm Glade PARAS, MD as PCP - General (Internal Medicine) Pietro Redell RAMAN, MD as PCP - Cardiology (Cardiology) Obie Princella HERO, MD (Inactive) (Gastroenterology) Anderson Maude ORN, MD (Inactive) (Orthopedic Surgery) Pietro Redell RAMAN, MD (Cardiology) [place], [facility] [place] Lavonia Lye, MD as Consulting Physician (Ophthalmology) Dannielle Bouchard, DO as Consulting Physician (Obstetrics and Gynecology)  I have updated your Care Teams any recent Medical Services you may have received from other providers in the past year.     Assessment:   This is a routine wellness examination for [name] [name].  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 1 1 0 0 0  Number falls in past yr: 0 0 0 0 0  Comment 1      Injury with Fall? 1 1 0 0 0  Risk for fall due to : Impaired balance/gait No Fall  Risks No Fall Risks No Fall Risks No Fall Risks  Follow up Falls evaluation completed;Falls prevention discussed Falls evaluation completed Falls evaluation completed Falls prevention discussed;Falls evaluation completed Falls evaluation completed    MEDICARE RISK AT HOME:  Medicare Risk at Home Any stairs in or around the home?: Yes If so, are there any without handrails?: No Home free of loose throw rugs in walkways, pet beds, electrical cords, etc?: Yes Adequate lighting in your home to reduce risk of falls?: Yes Life alert?: No Use of a cane, walker or w/c?: No Grab bars in the bathroom?: Yes Shower chair or bench in shower?: Yes Elevated toilet seat or a handicapped toilet?: Yes  TIMED UP AND GO:  Was the test performed?  No  Cognitive Function: 6CIT completed    01/04/2018   10:52 AM  MMSE - Mini Mental State Exam  Orientation to time 5  Orientation to Place 5  Registration 3  Attention/ Calculation 5  Recall 3  Language- name 2 objects 2  Language- repeat 1  Language- follow 3 step command 3  Language- read & follow direction 1  Write a sentence 1  Copy design 1  Total score 30        04/20/2024   10:25 AM 04/15/2023    1:11 PM 03/26/2022    9:03  AM  6CIT Screen  What Year? 0 points 0 points 0 points  What month? 0 points 0 points 0 points  What time? 0 points 0 points 0 points  Count back from 20 0 points 0 points 0 points  Months in reverse 0 points 0 points 0 points  Repeat phrase 2 points 0 points 0 points  Total Score 2 points 0 points 0 points    Immunizations Immunization History  Administered Date(s) Administered   INFLUENZA, HIGH DOSE SEASONAL PF 06/05/2018, 05/09/2020   Influenza Split 06/29/2011, 06/09/2012   Influenza Whole 06/05/2007, 06/04/2008, 04/27/2009   Influenza,inj,quad, With Preservative 06/11/2019   Influenza-Unspecified 05/16/2013, 06/17/2015, 06/15/2017, 05/23/2019   Moderna Sars-Covid-2 Vaccination 08/23/2019, 09/21/2019, 06/12/2020   Pneumococcal Conjugate-13 07/24/2013   Pneumococcal Polysaccharide-23 04/23/2008   Td 10/20/1998, 09/19/2009   Tdap 10/12/2021   Zoster, Live 01/25/2006    Screening Tests Health Maintenance  Topic Date Due   DEXA SCAN  Never done   Influenza Vaccine  03/16/2024   COVID-19 Vaccine (4 - 2025-26 season) 04/16/2024   Medicare Annual Wellness (AWV)  04/20/2025   DTaP/Tdap/Td (4 - Td or Tdap) 10/13/2031   Pneumococcal Vaccine: 50+ Years  Completed   HPV VACCINES  Aged Out   Meningococcal B Vaccine  Aged Out   Colonoscopy  Discontinued   Zoster Vaccines- Shingrix  Discontinued    Health Maintenance  Health Maintenance Due  Topic Date Due   DEXA SCAN  Never done   Influenza Vaccine  03/16/2024   COVID-19 Vaccine (4 - 2025-26 season) 04/16/2024   Health Maintenance Items Addressed:  DEXA scan status: Scheduled for 06/18/2024 at [facility] (Dr Duwaine Blumenthal - Gyn).  Additional Screening:  Vision Screening: Recommended annual ophthalmology exams for early detection of glaucoma and other disorders of the eye. Would you like a referral to an eye doctor? No    Dental Screening: Recommended annual dental exams for proper oral hygiene  Community  Resource Referral / Chronic Care Management: CRR required this visit?  No   CCM required this visit?  No   Plan:    I have personally reviewed and noted the following  in the patient's chart:   Medical and social history Use of alcohol, tobacco or illicit drugs  Current medications and supplements including opioid prescriptions. Patient is not currently taking opioid prescriptions. Functional ability and status Nutritional status Physical activity Advanced directives List of other physicians Hospitalizations, surgeries, and ER visits in previous 12 months Vitals Screenings to include cognitive, depression, and falls Referrals and appointments  In addition, I have reviewed and discussed with patient certain preventive protocols, quality metrics, and best practice recommendations. A written personalized care plan for preventive services as well as general preventive health recommendations were provided to patient.   Verdie CHRISTELLA Saba, CMA   04/20/2024   After Visit Summary: (MyChart) Due to this being a telephonic visit, the after visit summary with patients personalized plan was offered to patient via MyChart   Notes: Nothing significant to report at this time.

## 2024-04-20 NOTE — Patient Instructions (Addendum)
 [name],  Thank you for taking the time for your Medicare Wellness Visit. I appreciate your continued commitment to your health goals. Please review the care plan we discussed, and feel free to reach out if I can assist you further.  Medicare recommends these wellness visits once per year to help you and your care team stay ahead of potential health issues. These visits are designed to focus on prevention, allowing your provider to concentrate on managing your acute and chronic conditions during your regular appointments.  Please note that Annual Wellness Visits do not include a physical exam. Some assessments may be limited, especially if the visit was conducted virtually. If needed, we may recommend a separate in-person follow-up with your provider.  Ongoing Care Seeing your primary care provider every 3 to 6 months helps us  monitor your health and provide consistent, personalized care.   Referrals If a referral was made during today's visit and you haven't received any updates within two weeks, please contact the referred provider directly to check on the status.  Recommended Screenings:  Health Maintenance  Topic Date Due   DEXA scan (bone density measurement)  Never done   Flu Shot  03/16/2024   COVID-19 Vaccine (4 - 2025-26 season) 04/16/2024   Medicare Annual Wellness Visit  04/20/2025   DTaP/Tdap/Td vaccine (4 - Td or Tdap) 10/13/2031   Pneumococcal Vaccine for age over 17  Completed   HPV Vaccine  Aged Out   Meningitis B Vaccine  Aged Out   Colon Cancer Screening  Discontinued   Zoster (Shingles) Vaccine  Discontinued       04/20/2024   10:13 AM  Advanced Directives  Does Patient Have a Medical Advance Directive? Yes  Type of Estate agent of [place];Living will  Does patient want to make changes to medical advance directive? No - Patient declined  Copy of Healthcare Power of Attorney in Chart? Yes - validated most recent copy scanned in chart (See  row information)   Advance Care Planning is important because it: Ensures you receive medical care that aligns with your values, goals, and preferences. Provides guidance to your family and loved ones, reducing the emotional burden of decision-making during critical moments.  Vision: Annual vision screenings are recommended for early detection of glaucoma, cataracts, and diabetic retinopathy. These exams can also reveal signs of chronic conditions such as diabetes and high blood pressure.  Dental: Annual dental screenings help detect early signs of oral cancer, gum disease, and other conditions linked to overall health, including heart disease and diabetes.

## 2024-04-23 ENCOUNTER — Encounter: Payer: Self-pay | Admitting: Internal Medicine

## 2024-06-11 DIAGNOSIS — Z01419 Encounter for gynecological examination (general) (routine) without abnormal findings: Secondary | ICD-10-CM | POA: Diagnosis not present

## 2024-06-11 DIAGNOSIS — Z6829 Body mass index (BMI) 29.0-29.9, adult: Secondary | ICD-10-CM | POA: Diagnosis not present

## 2024-06-15 ENCOUNTER — Ambulatory Visit (INDEPENDENT_AMBULATORY_CARE_PROVIDER_SITE_OTHER): Payer: Self-pay

## 2024-06-15 ENCOUNTER — Other Ambulatory Visit: Payer: Self-pay

## 2024-06-15 DIAGNOSIS — B351 Tinea unguium: Secondary | ICD-10-CM

## 2024-06-15 NOTE — Progress Notes (Signed)
 Patient presents today for the laser maintenance treatment. Diagnosed with mycotic nail infection by Dr. Tobie.   Toenail most affected 1st 3rd 5th right foot.  All other systems are negative.  Nails were trimmed and filed thin. Laser therapy was administered to 1-5 toenails bilaterally and patient tolerated the treatment well. All safety precautions were in place.   Post treatment instructions reviewed and provided to patient. Patient had no questions regarding plan of care.   Follow up in 3 months for laser maintenance. We discussed following up with Dr. Tobie after the next laser treatment appointment.

## 2024-06-18 ENCOUNTER — Encounter: Payer: Self-pay | Admitting: Radiology

## 2024-06-18 DIAGNOSIS — N958 Other specified menopausal and perimenopausal disorders: Secondary | ICD-10-CM | POA: Diagnosis not present

## 2024-06-18 DIAGNOSIS — M8588 Other specified disorders of bone density and structure, other site: Secondary | ICD-10-CM | POA: Diagnosis not present

## 2024-06-18 LAB — HM DEXA SCAN

## 2024-07-18 ENCOUNTER — Ambulatory Visit (HOSPITAL_COMMUNITY)
Admission: RE | Admit: 2024-07-18 | Discharge: 2024-07-18 | Disposition: A | Discharge status: Home / Self Care | Source: Ambulatory Visit | Attending: Cardiology | Admitting: Cardiology

## 2024-07-18 DIAGNOSIS — I4821 Permanent atrial fibrillation: Secondary | ICD-10-CM | POA: Insufficient documentation

## 2024-07-18 DIAGNOSIS — I059 Rheumatic mitral valve disease, unspecified: Secondary | ICD-10-CM | POA: Diagnosis not present

## 2024-07-18 DIAGNOSIS — I361 Nonrheumatic tricuspid (valve) insufficiency: Secondary | ICD-10-CM

## 2024-07-18 LAB — ECHOCARDIOGRAM COMPLETE: S' Lateral: 3.27 cm

## 2024-07-19 ENCOUNTER — Ambulatory Visit: Payer: Self-pay | Admitting: Cardiology

## 2024-08-15 ENCOUNTER — Encounter: Payer: Self-pay | Admitting: Internal Medicine

## 2024-08-15 NOTE — Progress Notes (Signed)
 "   Subjective:    Patient ID: [name] [name], female    DOB: 10/13/1940, 83 y.o.   MRN: [id number]      HPI [name] [name] is here for a Physical exam and her chronic medical problems.    Overall doing well, but some frustration with getting older and not being able to do things that she wants to do.   After sitting for a while she has s stiffness and increased back pain.  If she is doing activity she has significant back pain.  Took tramadol  once - for severe pain recently and it helped.   Has intermittent white hot pain  in the right side of her back.   This is the area that was damaged by polio years ago and she hit when she fell last year.  She has this intermittent severe pain that the Tylenol  that she takes daily does not help  Rash - redness right posterior lower leg - no pain, mildly itch when she rubs it.  She noticed it today.   Has a sore in mouth - saw dentist - yesterday - started on amoxicillin    Medications and allergies reviewed with patient and updated if appropriate.  Medications Ordered Prior to Encounter[1]  Review of Systems  Constitutional:  Negative for fever.  Eyes:  Negative for visual disturbance.  Respiratory:  Positive for cough (first thing in the morning only). Negative for shortness of breath and wheezing.   Cardiovascular:  Positive for leg swelling (occ). Negative for chest pain and palpitations.  Gastrointestinal:  Positive for diarrhea (chronic - improved with probiotic). Negative for abdominal pain, blood in stool and constipation.       No gerd  Genitourinary:  Negative for dysuria.  Musculoskeletal:  Positive for arthralgias and back pain.  Skin:  Positive for rash (started today).  Neurological:  Negative for light-headedness and headaches.  Psychiatric/Behavioral:  Negative for dysphoric mood. The patient is not nervous/anxious.        Objective:   Vitals:   08/22/24 1104  BP: 126/82  Pulse: 80  Temp: 98.4 F (36.9 C)  SpO2: 94%    Filed Weights   08/22/24 1104  Weight: 156 lb 3.2 oz (70.9 kg)   Body mass index is 25.99 kg/m.  BP Readings from Last 3 Encounters:  08/22/24 126/82  01/02/24 134/70  12/19/23 122/64    Wt Readings from Last 3 Encounters:  08/22/24 156 lb 3.2 oz (70.9 kg)  04/20/24 156 lb (70.8 kg)  01/02/24 155 lb (70.3 kg)       Physical Exam Constitutional: She appears well-developed and well-nourished. No distress.  HENT:  Head: Normocephalic and atraumatic.  Right Ear: External ear normal. Normal ear canal and TM Left Ear: External ear normal.  Normal ear canal and TM Mouth/Throat: Oropharynx is clear and moist.  Eyes: Conjunctivae normal.  Neck: Neck supple. No tracheal deviation present. No thyromegaly present.  No carotid bruit  Cardiovascular: Normal rate, regular rhythm and normal heart sounds.   No murmur heard.  No edema. Pulmonary/Chest: Effort normal and breath sounds normal. No respiratory distress. She has no wheezes. She has no rales.  Breast: deferred   Abdominal: Soft. She exhibits no distension. There is no tenderness.  Lymphadenopathy: She has no cervical adenopathy.  Skin: Skin is warm and dry. She is not diaphoretic.  Area approximately the size of an orange on the right posterior medial calf, nonblanching, petechiae looking-nontender-she states it gets slightly itchy when touched.  No other rashes Psychiatric: She has a normal mood and affect. Her behavior is normal.     Lab Results  Component Value Date   WBC 8.3 01/02/2024   HGB 13.2 01/02/2024   HCT 39.1 01/02/2024   PLT 320.0 01/02/2024   GLUCOSE 79 01/02/2024   CHOL 218 (H) 01/31/2023   TRIG 115.0 01/31/2023   HDL 60.30 01/31/2023   LDLDIRECT 128.6 12/09/2010   LDLCALC 134 (H) 01/31/2023   ALT 16 12/17/2023   AST 19 12/17/2023   NA 137 01/02/2024   K 4.7 01/02/2024   CL 101 01/02/2024   CREATININE 1.06 01/02/2024   BUN 28 (H) 01/02/2024   CO2 27 01/02/2024   TSH 0.93 01/31/2023   INR 1.5  (H) 12/17/2023   HGBA1C 5.4 01/31/2023         Assessment & Plan:   Physical exam: Screening blood work  ordered Exercise  very active Weight  normal  Substance abuse  none   Reviewed recommended immunizations.   Health Maintenance  Topic Date Due   COVID-19 Vaccine (4 - 2025-26 season) 08/31/2024 (Originally 04/16/2024)   Medicare Annual Wellness (AWV)  04/20/2025   DTaP/Tdap/Td (4 - Td or Tdap) 10/13/2031   Pneumococcal Vaccine: 50+ Years  Completed   Influenza Vaccine  Completed   Bone Density Scan  Completed   Meningococcal B Vaccine  Aged Out   Mammogram  Discontinued   Colonoscopy  Discontinued   Zoster Vaccines- Shingrix  Discontinued          See Problem List for Assessment and Plan of chronic medical problems.         [1]  Current Outpatient Medications on File Prior to Visit  Medication Sig Dispense Refill   acetaminophen  (TYLENOL ) 500 MG tablet Take 1,000 mg by mouth every 6 (six) hours as needed for moderate pain or headache.     alum & mag hydroxide-simeth (MAALOX MAX) 400-400-40 MG/5ML suspension Take 15 mLs by mouth every 6 (six) hours as needed for indigestion. 355 mL 0   apixaban  (ELIQUIS ) 5 MG TABS tablet Take 1 tablet (5 mg total) by mouth 2 (two) times daily. 180 tablet 1   bisoprolol  (ZEBETA ) 10 MG tablet TAKE ONE TABLET BY MOUTH EVERY MORNING and TAKE 1/2 TABLET AT BEDTIME 135 tablet 1   cholecalciferol (VITAMIN D3) 25 MCG (1000 UNIT) tablet Take 1,000 Units by mouth daily.     conjugated estrogens (PREMARIN) vaginal cream Place 1 applicator vaginally daily as needed (prolapse flare).     cyanocobalamin  (VITAMIN B12) 1000 MCG tablet Take 1,000 mcg by mouth daily.     diltiazem  (CARDIZEM  CD) 360 MG 24 hr capsule TAKE ONE CAPSULE BY MOUTH ONCE DAILY 90 capsule 3   docusate sodium  (COLACE) 100 MG capsule Take 100 mg by mouth at bedtime.     Doxylamine  Succinate, Sleep, (UNISOM  PO) Take 1 tablet by mouth at bedtime.     famotidine  (PEPCID ) 20  MG tablet Take 20 mg by mouth 2 (two) times daily.     polyethylene glycol (MIRALAX  / GLYCOLAX ) 17 g packet Take 17 g by mouth daily as needed (constipation).     spironolactone  (ALDACTONE ) 25 MG tablet TAKE ONE TABLET BY MOUTH EVERY DAY 90 tablet 3   No current facility-administered medications on file prior to visit.   "

## 2024-08-15 NOTE — Patient Instructions (Addendum)
 "     Blood work was ordered.       Medications changes include :   tramadol  as needed for severe back pain.       Return in about 1 year (around 08/22/2025) for Physical Exam.    Health Maintenance, Female Adopting a healthy lifestyle and getting preventive care are important in promoting health and wellness. Ask your health care provider about: The right schedule for you to have regular tests and exams. Things you can do on your own to prevent diseases and keep yourself healthy. What should I know about diet, weight, and exercise? Eat a healthy diet  Eat a diet that includes plenty of vegetables, fruits, low-fat dairy products, and lean protein. Do not eat a lot of foods that are high in solid fats, added sugars, or sodium. Maintain a healthy weight Body mass index (BMI) is used to identify weight problems. It estimates body fat based on height and weight. Your health care provider can help determine your BMI and help you achieve or maintain a healthy weight. Get regular exercise Get regular exercise. This is one of the most important things you can do for your health. Most adults should: Exercise for at least 150 minutes each week. The exercise should increase your heart rate and make you sweat (moderate-intensity exercise). Do strengthening exercises at least twice a week. This is in addition to the moderate-intensity exercise. Spend less time sitting. Even light physical activity can be beneficial. Watch cholesterol and blood lipids Have your blood tested for lipids and cholesterol at 83 years of age, then have this test every 5 years. Have your cholesterol levels checked more often if: Your lipid or cholesterol levels are high. You are older than 83 years of age. You are at high risk for heart disease. What should I know about cancer screening? Depending on your health history and family history, you may need to have cancer screening at various ages. This may include  screening for: Breast cancer. Cervical cancer. Colorectal cancer. Skin cancer. Lung cancer. What should I know about heart disease, diabetes, and high blood pressure? Blood pressure and heart disease High blood pressure causes heart disease and increases the risk of stroke. This is more likely to develop in people who have high blood pressure readings or are overweight. Have your blood pressure checked: Every 3-5 years if you are 77-23 years of age. Every year if you are 79 years old or older. Diabetes Have regular diabetes screenings. This checks your fasting blood sugar level. Have the screening done: Once every three years after age 27 if you are at a normal weight and have a low risk for diabetes. More often and at a younger age if you are overweight or have a high risk for diabetes. What should I know about preventing infection? Hepatitis B If you have a higher risk for hepatitis B, you should be screened for this virus. Talk with your health care provider to find out if you are at risk for hepatitis B infection. Hepatitis C Testing is recommended for: Everyone born from [birth year] through 1965. Anyone with known risk factors for hepatitis C. Sexually transmitted infections (STIs) Get screened for STIs, including gonorrhea and chlamydia, if: You are sexually active and are younger than 83 years of age. You are older than 83 years of age and your health care provider tells you that you are at risk for this type of infection. Your sexual activity has changed since you were last screened,  and you are at increased risk for chlamydia or gonorrhea. Ask your health care provider if you are at risk. Ask your health care provider about whether you are at high risk for HIV. Your health care provider may recommend a prescription medicine to help prevent HIV infection. If you choose to take medicine to prevent HIV, you should first get tested for HIV. You should then be tested every 3 months for as  long as you are taking the medicine. Pregnancy If you are about to stop having your period (premenopausal) and you may become pregnant, seek counseling before you get pregnant. Take 400 to 800 micrograms (mcg) of folic acid every day if you become pregnant. Ask for birth control (contraception) if you want to prevent pregnancy. Osteoporosis and menopause Osteoporosis is a disease in which the bones lose minerals and strength with aging. This can result in bone fractures. If you are 29 years old or older, or if you are at risk for osteoporosis and fractures, ask your health care provider if you should: Be screened for bone loss. Take a calcium or vitamin D  supplement to lower your risk of fractures. Be given hormone replacement therapy (HRT) to treat symptoms of menopause. Follow these instructions at home: Alcohol use Do not drink alcohol if: Your health care provider tells you not to drink. You are pregnant, may be pregnant, or are planning to become pregnant. If you drink alcohol: Limit how much you have to: 0-1 drink a day. Know how much alcohol is in your drink. In the [place] one drink equals one 12 oz bottle of beer (355 mL), one 5 oz glass of wine (148 mL), or one 1 oz glass of hard liquor (44 mL). Lifestyle Do not use any products that contain nicotine or tobacco. These products include cigarettes, chewing tobacco, and vaping devices, such as e-cigarettes. If you need help quitting, ask your health care provider. Do not use street drugs. Do not share needles. Ask your health care provider for help if you need support or information about quitting drugs. General instructions Schedule regular health, dental, and eye exams. Stay current with your vaccines. Tell your health care provider if: You often feel depressed. You have ever been abused or do not feel safe at home. Summary Adopting a healthy lifestyle and getting preventive care are important in promoting health and  wellness. Follow your health care provider's instructions about healthy diet, exercising, and getting tested or screened for diseases. Follow your health care provider's instructions on monitoring your cholesterol and blood pressure. This information is not intended to replace advice given to you by your health care provider. Make sure you discuss any questions you have with your health care provider. Document Revised: 12/22/2020 Document Reviewed: 12/22/2020 Elsevier Patient Education  2024 [employer]. "

## 2024-08-20 ENCOUNTER — Ambulatory Visit: Admitting: Orthopaedic Surgery

## 2024-08-20 ENCOUNTER — Other Ambulatory Visit (INDEPENDENT_AMBULATORY_CARE_PROVIDER_SITE_OTHER)

## 2024-08-20 ENCOUNTER — Encounter: Payer: Self-pay | Admitting: Orthopaedic Surgery

## 2024-08-20 DIAGNOSIS — Z96652 Presence of left artificial knee joint: Secondary | ICD-10-CM | POA: Diagnosis not present

## 2024-08-20 NOTE — Progress Notes (Signed)
 The patient is an active 84 year old female who is now 11 months status post a left total knee replacement to treat significant left knee pain and arthritis.  She had a right knee replaced back in 2015 by Dr. Anderson.  She does have some complaints about pain and sensitivity around her left knee but it is only to touch and not with activities.  She walks without any assistive device.  Examination of her left knee shows the incision is healed nicely.  There is no redness.  There is no warmth.  The knee has good range of motion his ligaments stable.  It is tender around the incision to light touch.  Her right knee seems to be doing well.  2 views of the left knee also shows the AP view of the right knee.  There are no complicating features on the x-rays of either knee replacement.  At this point follow-up can be as needed since she is doing well.  She can try Voltaren gel and Biofreeze on her left knee and I think this will help desensitize the knee and it should improve with time.  All questions and concerns were addressed and answered.

## 2024-08-21 ENCOUNTER — Telehealth: Payer: Self-pay

## 2024-08-21 NOTE — Telephone Encounter (Signed)
 Spoke with patient today.

## 2024-08-21 NOTE — Telephone Encounter (Signed)
 Copied from CRM [id number]. Topic: Clinical - Request for Lab/Test Order >> Aug 21, 2024  2:41 PM Robinson H wrote: Reason for CRM: Patient wants to know if she can have labs done before appointment tomorrow at 10:40 am states she doesn't want to wait that long to eat, wants to know if she can come by early in the morning to have labs done.   Natausha [phone]

## 2024-08-22 ENCOUNTER — Ambulatory Visit: Admitting: Internal Medicine

## 2024-08-22 VITALS — BP 126/82 | HR 80 | Temp 98.4°F | Ht 65.0 in | Wt 156.2 lb

## 2024-08-22 DIAGNOSIS — E538 Deficiency of other specified B group vitamins: Secondary | ICD-10-CM | POA: Diagnosis not present

## 2024-08-22 DIAGNOSIS — M545 Low back pain, unspecified: Secondary | ICD-10-CM | POA: Diagnosis not present

## 2024-08-22 DIAGNOSIS — E7800 Pure hypercholesterolemia, unspecified: Secondary | ICD-10-CM

## 2024-08-22 DIAGNOSIS — M8589 Other specified disorders of bone density and structure, multiple sites: Secondary | ICD-10-CM

## 2024-08-22 DIAGNOSIS — Z0000 Encounter for general adult medical examination without abnormal findings: Secondary | ICD-10-CM

## 2024-08-22 DIAGNOSIS — I4821 Permanent atrial fibrillation: Secondary | ICD-10-CM | POA: Diagnosis not present

## 2024-08-22 DIAGNOSIS — E559 Vitamin D deficiency, unspecified: Secondary | ICD-10-CM | POA: Diagnosis not present

## 2024-08-22 DIAGNOSIS — G8929 Other chronic pain: Secondary | ICD-10-CM | POA: Diagnosis not present

## 2024-08-22 DIAGNOSIS — R600 Localized edema: Secondary | ICD-10-CM | POA: Diagnosis not present

## 2024-08-22 DIAGNOSIS — R21 Rash and other nonspecific skin eruption: Secondary | ICD-10-CM

## 2024-08-22 DIAGNOSIS — I10 Essential (primary) hypertension: Secondary | ICD-10-CM | POA: Diagnosis not present

## 2024-08-22 LAB — COMPREHENSIVE METABOLIC PANEL WITH GFR
ALT: 16 U/L (ref 3–35)
AST: 23 U/L (ref 5–37)
Albumin: 4.5 g/dL (ref 3.5–5.2)
Alkaline Phosphatase: 75 U/L (ref 39–117)
BUN: 21 mg/dL (ref 6–23)
CO2: 28 meq/L (ref 19–32)
Calcium: 9.5 mg/dL (ref 8.4–10.5)
Chloride: 101 meq/L (ref 96–112)
Creatinine, Ser: 0.98 mg/dL (ref 0.40–1.20)
GFR: 53.26 mL/min — ABNORMAL LOW
Glucose, Bld: 104 mg/dL — ABNORMAL HIGH (ref 70–99)
Potassium: 4.4 meq/L (ref 3.5–5.1)
Sodium: 138 meq/L (ref 135–145)
Total Bilirubin: 0.6 mg/dL (ref 0.2–1.2)
Total Protein: 7.5 g/dL (ref 6.0–8.3)

## 2024-08-22 LAB — CBC
HCT: 40.5 % (ref 36.0–46.0)
Hemoglobin: 13.4 g/dL (ref 12.0–15.0)
MCHC: 33 g/dL (ref 30.0–36.0)
MCV: 98.8 fl (ref 78.0–100.0)
Platelets: 283 K/uL (ref 150.0–400.0)
RBC: 4.1 Mil/uL (ref 3.87–5.11)
RDW: 15.8 % — ABNORMAL HIGH (ref 11.5–15.5)
WBC: 6.5 K/uL (ref 4.0–10.5)

## 2024-08-22 LAB — LIPID PANEL
Cholesterol: 212 mg/dL — ABNORMAL HIGH (ref 28–200)
HDL: 63.9 mg/dL
LDL Cholesterol: 132 mg/dL — ABNORMAL HIGH (ref 10–99)
NonHDL: 148.11
Total CHOL/HDL Ratio: 3
Triglycerides: 82 mg/dL (ref 10.0–149.0)
VLDL: 16.4 mg/dL (ref 0.0–40.0)

## 2024-08-22 LAB — TSH: TSH: 1.24 u[IU]/mL (ref 0.35–5.50)

## 2024-08-22 LAB — VITAMIN D 25 HYDROXY (VIT D DEFICIENCY, FRACTURES): VITD: 30.95 ng/mL (ref 30.00–100.00)

## 2024-08-22 LAB — VITAMIN B12: Vitamin B-12: 1489 pg/mL — ABNORMAL HIGH (ref 211–911)

## 2024-08-22 MED ORDER — FUROSEMIDE 20 MG PO TABS: 20.0000 mg | ORAL_TABLET | Freq: Every day | ORAL | Status: AC | PRN

## 2024-08-22 MED ORDER — TRAMADOL HCL 50 MG PO TABS: 50.0000 mg | ORAL_TABLET | Freq: Two times a day (BID) | ORAL | 0 refills | Status: AC | PRN

## 2024-08-22 NOTE — Assessment & Plan Note (Signed)
 New She just noticed rash this morning-it is slightly itchy when touched, but nontender No obvious change since this morning No other rashes No exposure or obvious cause It is nonblanching, petechial looking CBC, CMP She will monitor for now and let me know if this changes, does not resolve or she develops any new rashes

## 2024-08-22 NOTE — Assessment & Plan Note (Signed)
 Chronic Asymptomatic Following with cardiology Continue bisoprolol , Cardizem  and Eliquis  Check CBC, CMP, TSH

## 2024-08-22 NOTE — Assessment & Plan Note (Addendum)
 Chronic Stable Rare edema Continue furosemide  20 mg daily as needed only  -takes rarely

## 2024-08-22 NOTE — Assessment & Plan Note (Signed)
 Chronic Taking vitamin d daily Check vitamin d level

## 2024-08-22 NOTE — Assessment & Plan Note (Signed)
 Chronic Taking B12 supplementation orally Check B12 level

## 2024-08-22 NOTE — Assessment & Plan Note (Signed)
 Chronic Blood pressure controlled CBC, CMP Continue bisoprolol  10 mg in the morning, 5 mg at bedtime, diltiazem  360 mg daily, spironolactone  25 mg daily

## 2024-08-22 NOTE — Assessment & Plan Note (Addendum)
 Chronic Related to severe scoliosis Scoliosis and back pain is limiting her mobility Uses rollator, walking stick Tries to stay active and with some activities will have severe burning, white pain Currently takes Tylenol  daily-3000 mg once per day-this takes the edge off Did take some old pain medication she had and it seemed to help-did not tolerate the oxycodone , but tramadol  really helped I will prescribe a small amount of tramadol  for her to take only as needed for severe pain Discussed possible side effect of constipation, dizziness, drowsiness and affecting memory if used long-term If she does need a refill of the medication or continues to take it more regularly she will need to follow-up every 6 months

## 2024-08-22 NOTE — Assessment & Plan Note (Signed)
Chronic Monitored by gyn  Check vitamin d level 

## 2024-08-22 NOTE — Assessment & Plan Note (Signed)
 Chronic Regular exercise and healthy diet encouraged Check lipid panel, CMP Did not tolerate statins or Zetia  Continue lifestyle controlled

## 2024-08-23 ENCOUNTER — Ambulatory Visit: Payer: Self-pay | Admitting: Internal Medicine

## 2024-08-29 ENCOUNTER — Telehealth: Payer: Self-pay | Admitting: Cardiology

## 2024-08-29 DIAGNOSIS — I4821 Permanent atrial fibrillation: Secondary | ICD-10-CM

## 2024-08-29 NOTE — Telephone Encounter (Signed)
" °*  STAT* If patient is at the pharmacy, call can be transferred to refill team.   1. Which medications need to be refilled? (please list name of each medication and dose if known) apixaban  (ELIQUIS ) 5 MG TABS tablet    2. Would you like to learn more about the convenience, safety, & potential cost savings by using the [facility]? No   3. Are you open to using the [facility] (Type [facility]. ). No   4. Which pharmacy/location (including street and city if local pharmacy) is medication to be sent to?[facility] - [place], [place] - [address]    5. Do they need a 30 day or 90 day supply? 90  Pt has 1 left.   "

## 2024-08-30 MED ORDER — APIXABAN 5 MG PO TABS: 5.0000 mg | ORAL_TABLET | Freq: Two times a day (BID) | ORAL | 0 refills | Status: AC

## 2024-08-30 NOTE — Telephone Encounter (Signed)
 Pt's medication was sent to pt's pharmacy as requested. Confirmation received.

## 2024-09-14 ENCOUNTER — Other Ambulatory Visit

## 2024-09-21 ENCOUNTER — Ambulatory Visit (INDEPENDENT_AMBULATORY_CARE_PROVIDER_SITE_OTHER)

## 2024-09-21 DIAGNOSIS — B351 Tinea unguium: Secondary | ICD-10-CM

## 2024-09-21 NOTE — Progress Notes (Signed)
 Patient presents today for the 3 month laser maintenance treatment. Diagnosed with mycotic nail infection by Dr. Tobie.   Toenail most affected Right 1st, 3rd, 5th.  All other systems are negative.  Nails were filed thin. Laser therapy was administered to 1-5 toenails bilaterally and patient tolerated the treatment well. All safety precautions were in place.   Post treatment instructions reviewed and provided to patient. Patient had no questions regarding plan of care.    Follow up with provider in 3 months.

## 2024-09-21 NOTE — Patient Instructions (Signed)

## 2025-01-02 ENCOUNTER — Ambulatory Visit: Admitting: Podiatry

## 2025-05-22 ENCOUNTER — Ambulatory Visit
